# Patient Record
Sex: Female | Born: 1970 | Race: Black or African American | Hispanic: No | Smoking: Former smoker
Health system: Southern US, Community
[De-identification: ages and names within clinical notes are randomized; demographics above are authoritative.]

## PROBLEM LIST (undated history)

## (undated) DIAGNOSIS — M1990 Unspecified osteoarthritis, unspecified site: Secondary | ICD-10-CM

## (undated) DIAGNOSIS — F419 Anxiety disorder, unspecified: Secondary | ICD-10-CM

## (undated) DIAGNOSIS — D649 Anemia, unspecified: Secondary | ICD-10-CM

## (undated) DIAGNOSIS — I10 Essential (primary) hypertension: Secondary | ICD-10-CM

## (undated) DIAGNOSIS — F329 Major depressive disorder, single episode, unspecified: Secondary | ICD-10-CM

## (undated) DIAGNOSIS — D573 Sickle-cell trait: Secondary | ICD-10-CM

## (undated) DIAGNOSIS — E7800 Pure hypercholesterolemia, unspecified: Secondary | ICD-10-CM

## (undated) DIAGNOSIS — Z9989 Dependence on other enabling machines and devices: Secondary | ICD-10-CM

## (undated) DIAGNOSIS — E739 Lactose intolerance, unspecified: Secondary | ICD-10-CM

## (undated) DIAGNOSIS — F32A Depression, unspecified: Secondary | ICD-10-CM

## (undated) DIAGNOSIS — E559 Vitamin D deficiency, unspecified: Secondary | ICD-10-CM

## (undated) DIAGNOSIS — R000 Tachycardia, unspecified: Secondary | ICD-10-CM

## (undated) DIAGNOSIS — T7840XA Allergy, unspecified, initial encounter: Secondary | ICD-10-CM

## (undated) DIAGNOSIS — M549 Dorsalgia, unspecified: Secondary | ICD-10-CM

## (undated) DIAGNOSIS — M2550 Pain in unspecified joint: Secondary | ICD-10-CM

## (undated) DIAGNOSIS — G4733 Obstructive sleep apnea (adult) (pediatric): Secondary | ICD-10-CM

## (undated) DIAGNOSIS — G43909 Migraine, unspecified, not intractable, without status migrainosus: Secondary | ICD-10-CM

## (undated) DIAGNOSIS — R7303 Prediabetes: Secondary | ICD-10-CM

## (undated) DIAGNOSIS — F17200 Nicotine dependence, unspecified, uncomplicated: Secondary | ICD-10-CM

## (undated) DIAGNOSIS — K5900 Constipation, unspecified: Secondary | ICD-10-CM

## (undated) DIAGNOSIS — G35 Multiple sclerosis: Secondary | ICD-10-CM

## (undated) DIAGNOSIS — K219 Gastro-esophageal reflux disease without esophagitis: Secondary | ICD-10-CM

## (undated) DIAGNOSIS — K900 Celiac disease: Secondary | ICD-10-CM

## (undated) DIAGNOSIS — M7989 Other specified soft tissue disorders: Secondary | ICD-10-CM

## (undated) DIAGNOSIS — E039 Hypothyroidism, unspecified: Secondary | ICD-10-CM

## (undated) DIAGNOSIS — R079 Chest pain, unspecified: Secondary | ICD-10-CM

## (undated) HISTORY — DX: Anxiety disorder, unspecified: F41.9

## (undated) HISTORY — DX: Nicotine dependence, unspecified, uncomplicated: F17.200

## (undated) HISTORY — DX: Dorsalgia, unspecified: M54.9

## (undated) HISTORY — DX: Other specified soft tissue disorders: M79.89

## (undated) HISTORY — PX: LASER ABLATION OF THE CERVIX: SHX1949

## (undated) HISTORY — DX: Sickle-cell trait: D57.3

## (undated) HISTORY — DX: Depression, unspecified: F32.A

## (undated) HISTORY — DX: Vitamin D deficiency, unspecified: E55.9

## (undated) HISTORY — DX: Allergy, unspecified, initial encounter: T78.40XA

## (undated) HISTORY — DX: Unspecified osteoarthritis, unspecified site: M19.90

## (undated) HISTORY — DX: Dependence on other enabling machines and devices: Z99.89

## (undated) HISTORY — PX: CHOLECYSTECTOMY: SHX55

## (undated) HISTORY — DX: Tachycardia, unspecified: R00.0

## (undated) HISTORY — PX: TUBAL LIGATION: SHX77

## (undated) HISTORY — DX: Anemia, unspecified: D64.9

## (undated) HISTORY — DX: Migraine, unspecified, not intractable, without status migrainosus: G43.909

## (undated) HISTORY — DX: Hypothyroidism, unspecified: E03.9

## (undated) HISTORY — DX: Pain in unspecified joint: M25.50

## (undated) HISTORY — DX: Multiple sclerosis: G35

## (undated) HISTORY — DX: Constipation, unspecified: K59.00

## (undated) HISTORY — DX: Lactose intolerance, unspecified: E73.9

## (undated) HISTORY — DX: Essential (primary) hypertension: I10

## (undated) HISTORY — DX: Obstructive sleep apnea (adult) (pediatric): G47.33

## (undated) HISTORY — DX: Pure hypercholesterolemia, unspecified: E78.00

## (undated) HISTORY — DX: Major depressive disorder, single episode, unspecified: F32.9

## (undated) HISTORY — DX: Hemochromatosis due to repeated red blood cell transfusions: E83.111

## (undated) HISTORY — PX: THYROID SURGERY: SHX805

## (undated) HISTORY — DX: Chest pain, unspecified: R07.9

## (undated) HISTORY — PX: CERVICAL ABLATION: SHX5771

---

## 2004-09-24 ENCOUNTER — Other Ambulatory Visit: Admission: RE | Admit: 2004-09-24 | Discharge: 2004-09-24 | Payer: Self-pay | Admitting: Gynecology

## 2004-10-21 ENCOUNTER — Ambulatory Visit: Payer: Self-pay | Admitting: Oncology

## 2005-01-19 ENCOUNTER — Ambulatory Visit: Payer: Self-pay | Admitting: Oncology

## 2006-11-14 DIAGNOSIS — D649 Anemia, unspecified: Secondary | ICD-10-CM

## 2006-11-14 HISTORY — DX: Anemia, unspecified: D64.9

## 2007-06-15 ENCOUNTER — Ambulatory Visit: Payer: Self-pay | Admitting: Oncology

## 2007-07-18 ENCOUNTER — Other Ambulatory Visit: Admission: RE | Admit: 2007-07-18 | Discharge: 2007-07-18 | Payer: Self-pay | Admitting: Gynecology

## 2007-08-03 ENCOUNTER — Ambulatory Visit: Payer: Self-pay | Admitting: Oncology

## 2007-08-07 LAB — COMPREHENSIVE METABOLIC PANEL
AST: 17 U/L (ref 0–37)
Albumin: 4.1 g/dL (ref 3.5–5.2)
BUN: 11 mg/dL (ref 6–23)
CO2: 24 mEq/L (ref 19–32)
Calcium: 9.5 mg/dL (ref 8.4–10.5)
Chloride: 104 mEq/L (ref 96–112)
Creatinine, Ser: 0.75 mg/dL (ref 0.40–1.20)
Potassium: 4.2 mEq/L (ref 3.5–5.3)

## 2007-08-07 LAB — CBC WITH DIFFERENTIAL/PLATELET
Basophils Absolute: 0.1 10*3/uL (ref 0.0–0.1)
Eosinophils Absolute: 0.3 10*3/uL (ref 0.0–0.5)
HCT: 30.5 % — ABNORMAL LOW (ref 34.8–46.6)
HGB: 9.2 g/dL — ABNORMAL LOW (ref 11.6–15.9)
LYMPH%: 14.8 % (ref 14.0–48.0)
MONO#: 0.7 10*3/uL (ref 0.1–0.9)
NEUT#: 8.4 10*3/uL — ABNORMAL HIGH (ref 1.5–6.5)
NEUT%: 76.3 % (ref 39.6–76.8)
Platelets: 410 10*3/uL — ABNORMAL HIGH (ref 145–400)
WBC: 11.1 10*3/uL — ABNORMAL HIGH (ref 3.9–10.0)
lymph#: 1.6 10*3/uL (ref 0.9–3.3)

## 2007-08-07 LAB — IRON AND TIBC
%SAT: 3 % — ABNORMAL LOW (ref 20–55)
Iron: 11 ug/dL — ABNORMAL LOW (ref 42–145)
TIBC: 438 ug/dL (ref 250–470)
UIBC: 427 ug/dL

## 2007-09-19 ENCOUNTER — Ambulatory Visit: Payer: Self-pay | Admitting: Oncology

## 2009-06-30 ENCOUNTER — Ambulatory Visit: Payer: Self-pay | Admitting: Oncology

## 2009-06-30 LAB — CBC & DIFF AND RETIC
Basophils Absolute: 0 10*3/uL (ref 0.0–0.1)
Eosinophils Absolute: 0.2 10*3/uL (ref 0.0–0.5)
HGB: 7.1 g/dL — ABNORMAL LOW (ref 11.6–15.9)
LYMPH%: 21.8 % (ref 14.0–49.7)
MCH: 14.9 pg — ABNORMAL LOW (ref 25.1–34.0)
MCV: 55 fL — ABNORMAL LOW (ref 79.5–101.0)
MONO%: 6.6 % (ref 0.0–14.0)
NEUT#: 6.7 10*3/uL — ABNORMAL HIGH (ref 1.5–6.5)
NEUT%: 69.5 % (ref 38.4–76.8)
Platelets: 265 10*3/uL (ref 145–400)

## 2009-06-30 LAB — COMPREHENSIVE METABOLIC PANEL
Albumin: 4 g/dL (ref 3.5–5.2)
Alkaline Phosphatase: 69 U/L (ref 39–117)
CO2: 24 mEq/L (ref 19–32)
Calcium: 9.3 mg/dL (ref 8.4–10.5)
Chloride: 106 mEq/L (ref 96–112)
Glucose, Bld: 91 mg/dL (ref 70–99)
Potassium: 4.1 mEq/L (ref 3.5–5.3)
Sodium: 140 mEq/L (ref 135–145)
Total Protein: 7.4 g/dL (ref 6.0–8.3)

## 2009-07-01 LAB — IRON AND TIBC: UIBC: 437 ug/dL

## 2009-07-03 LAB — FECAL OCCULT BLOOD, GUAIAC: Occult Blood: NEGATIVE

## 2009-07-04 ENCOUNTER — Encounter: Payer: Self-pay | Admitting: Emergency Medicine

## 2009-07-05 ENCOUNTER — Ambulatory Visit: Payer: Self-pay | Admitting: Family Medicine

## 2009-07-05 ENCOUNTER — Observation Stay (HOSPITAL_COMMUNITY): Admission: EM | Admit: 2009-07-05 | Discharge: 2009-07-05 | Payer: Self-pay | Admitting: Family Medicine

## 2009-07-30 ENCOUNTER — Encounter: Admission: RE | Admit: 2009-07-30 | Discharge: 2009-07-30 | Payer: Self-pay | Admitting: Emergency Medicine

## 2009-08-26 ENCOUNTER — Ambulatory Visit: Payer: Self-pay | Admitting: Oncology

## 2009-09-04 LAB — CBC WITH DIFFERENTIAL/PLATELET
BASO%: 0.3 % (ref 0.0–2.0)
Basophils Absolute: 0 10*3/uL (ref 0.0–0.1)
HCT: 36.6 % (ref 34.8–46.6)
HGB: 11.4 g/dL — ABNORMAL LOW (ref 11.6–15.9)
LYMPH%: 23.5 % (ref 14.0–49.7)
MCH: 23.1 pg — ABNORMAL LOW (ref 25.1–34.0)
MCHC: 31.1 g/dL — ABNORMAL LOW (ref 31.5–36.0)
MONO#: 0.4 10*3/uL (ref 0.1–0.9)
NEUT%: 70.5 % (ref 38.4–76.8)
Platelets: 310 10*3/uL (ref 145–400)
WBC: 8.6 10*3/uL (ref 3.9–10.3)

## 2009-09-04 LAB — IRON AND TIBC: Iron: 39 ug/dL — ABNORMAL LOW (ref 42–145)

## 2009-09-23 ENCOUNTER — Ambulatory Visit: Payer: Self-pay | Admitting: Oncology

## 2009-09-25 LAB — CBC WITH DIFFERENTIAL/PLATELET
Basophils Absolute: 0 10*3/uL (ref 0.0–0.1)
EOS%: 1.3 % (ref 0.0–7.0)
HGB: 12 g/dL (ref 11.6–15.9)
MCH: 24.1 pg — ABNORMAL LOW (ref 25.1–34.0)
MCV: 75.7 fL — ABNORMAL LOW (ref 79.5–101.0)
MONO%: 4.1 % (ref 0.0–14.0)
NEUT#: 6.6 10*3/uL — ABNORMAL HIGH (ref 1.5–6.5)
RBC: 4.98 10*6/uL (ref 3.70–5.45)
RDW: 18.8 % — ABNORMAL HIGH (ref 11.2–14.5)
lymph#: 2.1 10*3/uL (ref 0.9–3.3)
nRBC: 0 % (ref 0–0)

## 2009-09-29 LAB — COMPREHENSIVE METABOLIC PANEL
ALT: 16 U/L (ref 0–35)
AST: 15 U/L (ref 0–37)
Albumin: 4.2 g/dL (ref 3.5–5.2)
Alkaline Phosphatase: 66 U/L (ref 39–117)
Potassium: 3.6 mEq/L (ref 3.5–5.3)
Sodium: 141 mEq/L (ref 135–145)
Total Bilirubin: 0.2 mg/dL — ABNORMAL LOW (ref 0.3–1.2)
Total Protein: 7.2 g/dL (ref 6.0–8.3)

## 2009-09-29 LAB — HEMOGLOBINOPATHY EVALUATION
Hemoglobin Other: 0 % (ref 0.0–0.0)
Hgb A2 Quant: 2.7 % (ref 2.2–3.2)
Hgb A: 64.4 % — ABNORMAL LOW (ref 96.8–97.8)
Hgb S Quant: 32.9 % — ABNORMAL HIGH (ref 0.0–0.0)

## 2009-12-11 ENCOUNTER — Ambulatory Visit: Payer: Self-pay | Admitting: Oncology

## 2009-12-11 LAB — CBC & DIFF AND RETIC
BASO%: 0.6 % (ref 0.0–2.0)
LYMPH%: 24.5 % (ref 14.0–49.7)
MCHC: 32 g/dL (ref 31.5–36.0)
MCV: 77.1 fL — ABNORMAL LOW (ref 79.5–101.0)
MONO#: 0.4 10*3/uL (ref 0.1–0.9)
MONO%: 5 % (ref 0.0–14.0)
Platelets: 271 10*3/uL (ref 145–400)
RBC: 5.03 10*6/uL (ref 3.70–5.45)
WBC: 8.8 10*3/uL (ref 3.9–10.3)
nRBC: 0 % (ref 0–0)

## 2009-12-11 LAB — IRON AND TIBC
%SAT: 16 % — ABNORMAL LOW (ref 20–55)
TIBC: 352 ug/dL (ref 250–470)

## 2010-01-13 ENCOUNTER — Ambulatory Visit: Payer: Self-pay | Admitting: Oncology

## 2011-02-04 ENCOUNTER — Other Ambulatory Visit (HOSPITAL_COMMUNITY): Payer: Self-pay | Admitting: Oncology

## 2011-02-04 ENCOUNTER — Encounter (HOSPITAL_BASED_OUTPATIENT_CLINIC_OR_DEPARTMENT_OTHER): Payer: 59 | Admitting: Oncology

## 2011-02-04 DIAGNOSIS — D509 Iron deficiency anemia, unspecified: Secondary | ICD-10-CM

## 2011-02-04 LAB — COMPREHENSIVE METABOLIC PANEL
ALT: 17 U/L (ref 0–35)
AST: 20 U/L (ref 0–37)
Alkaline Phosphatase: 72 U/L (ref 39–117)
BUN: 10 mg/dL (ref 6–23)
Creatinine, Ser: 0.72 mg/dL (ref 0.40–1.20)
Potassium: 4.3 mEq/L (ref 3.5–5.3)

## 2011-02-04 LAB — IRON AND TIBC
TIBC: 437 ug/dL (ref 250–470)
UIBC: 426 ug/dL

## 2011-02-04 LAB — FERRITIN: Ferritin: 2 ng/mL — ABNORMAL LOW (ref 10–291)

## 2011-02-04 LAB — CBC WITH DIFFERENTIAL/PLATELET
Basophils Absolute: 0 10*3/uL (ref 0.0–0.1)
Eosinophils Absolute: 0.2 10*3/uL (ref 0.0–0.5)
HGB: 8 g/dL — ABNORMAL LOW (ref 11.6–15.9)
NEUT#: 7.2 10*3/uL — ABNORMAL HIGH (ref 1.5–6.5)
RDW: 20.8 % — ABNORMAL HIGH (ref 11.2–14.5)
WBC: 9.8 10*3/uL (ref 3.9–10.3)
lymph#: 1.9 10*3/uL (ref 0.9–3.3)

## 2011-02-09 ENCOUNTER — Encounter (HOSPITAL_BASED_OUTPATIENT_CLINIC_OR_DEPARTMENT_OTHER): Payer: 59 | Admitting: Oncology

## 2011-02-09 DIAGNOSIS — D509 Iron deficiency anemia, unspecified: Secondary | ICD-10-CM

## 2011-02-15 ENCOUNTER — Inpatient Hospital Stay (HOSPITAL_BASED_OUTPATIENT_CLINIC_OR_DEPARTMENT_OTHER): Payer: 59 | Admitting: Oncology

## 2011-02-15 DIAGNOSIS — D509 Iron deficiency anemia, unspecified: Secondary | ICD-10-CM

## 2011-02-19 LAB — CBC
HCT: 22.6 % — ABNORMAL LOW (ref 36.0–46.0)
Hemoglobin: 6.8 g/dL — CL (ref 12.0–15.0)
MCV: 56 fL — ABNORMAL LOW (ref 78.0–100.0)
RBC: 4.34 MIL/uL (ref 3.87–5.11)
RDW: 22.4 % — ABNORMAL HIGH (ref 11.5–15.5)
WBC: 29.9 10*3/uL — ABNORMAL HIGH (ref 4.0–10.5)
WBC: 32 10*3/uL — ABNORMAL HIGH (ref 4.0–10.5)

## 2011-02-19 LAB — POCT CARDIAC MARKERS
CKMB, poc: <1 ng/mL — ABNORMAL LOW (ref 1.0–8.0)
Troponin i, poc: <0.05 ng/mL (ref 0.00–0.09)

## 2011-02-19 LAB — RETICULOCYTES
RBC.: 4.23 MIL/uL (ref 3.87–5.11)
Retic Count, Absolute: 84.6 10*3/uL (ref 19.0–186.0)

## 2011-02-19 LAB — DIFFERENTIAL
Basophils Absolute: 0 10*3/uL (ref 0.0–0.1)
Lymphocytes Relative: 7 % — ABNORMAL LOW (ref 12–46)
Lymphs Abs: 2.2 10*3/uL (ref 0.7–4.0)
Monocytes Relative: 5 % (ref 3–12)
Neutrophils Relative %: 88 % — ABNORMAL HIGH (ref 43–77)

## 2011-02-19 LAB — DIC (DISSEMINATED INTRAVASCULAR COAGULATION)PANEL
D-Dimer, Quant: 0.85 ug/mL-FEU — ABNORMAL HIGH (ref 0.00–0.48)
Prothrombin Time: 13.2 seconds (ref 11.6–15.2)

## 2011-02-19 LAB — COMPREHENSIVE METABOLIC PANEL
ALT: 13 U/L (ref 0–35)
Albumin: 3.1 g/dL — ABNORMAL LOW (ref 3.5–5.2)
Calcium: 9.1 mg/dL (ref 8.4–10.5)
Glucose, Bld: 130 mg/dL — ABNORMAL HIGH (ref 70–99)
Sodium: 140 mEq/L (ref 135–145)
Total Protein: 6.6 g/dL (ref 6.0–8.3)

## 2011-02-19 LAB — CULTURE, BLOOD (ROUTINE X 2): Culture: NO GROWTH

## 2011-02-19 LAB — CARDIAC PANEL(CRET KIN+CKTOT+MB+TROPI)
CK, MB: 2.1 ng/mL (ref 0.3–4.0)
CK, MB: 2.3 ng/mL (ref 0.3–4.0)
Relative Index: INVALID (ref 0.0–2.5)
Relative Index: INVALID (ref 0.0–2.5)
Total CK: 46 U/L (ref 7–177)

## 2011-02-19 LAB — BLOOD GAS, ARTERIAL
Acid-Base Excess: 1.9 mmol/L (ref 0.0–2.0)
Bicarbonate: 26.1 mEq/L — ABNORMAL HIGH (ref 20.0–24.0)
O2 Saturation: 95.6 %
TCO2: 27.4 mmol/L (ref 0–100)
pCO2 arterial: 41.8 mmHg (ref 35.0–45.0)
pO2, Arterial: 74.4 mmHg — ABNORMAL LOW (ref 80.0–100.0)

## 2011-02-19 LAB — BASIC METABOLIC PANEL
Calcium: 9.3 mg/dL (ref 8.4–10.5)
Creatinine, Ser: 0.81 mg/dL (ref 0.4–1.2)
GFR calc Af Amer: >60 mL/min (ref >60)
GFR calc non Af Amer: >60 mL/min (ref >60)

## 2011-02-19 LAB — LACTATE DEHYDROGENASE: LDH: 153 U/L (ref 94–250)

## 2011-02-19 LAB — URINE CULTURE

## 2011-02-25 ENCOUNTER — Encounter (HOSPITAL_COMMUNITY)
Admission: RE | Admit: 2011-02-25 | Discharge: 2011-02-25 | Disposition: A | Discharge status: Home / Self Care | Payer: 59 | Source: Ambulatory Visit | Attending: Obstetrics and Gynecology | Admitting: Obstetrics and Gynecology

## 2011-03-04 ENCOUNTER — Ambulatory Visit (HOSPITAL_COMMUNITY)
Admission: AD | Admit: 2011-03-04 | Discharge: 2011-03-04 | Disposition: A | Discharge status: Home / Self Care | Payer: 59 | Source: Ambulatory Visit | Attending: Obstetrics and Gynecology | Admitting: Obstetrics and Gynecology

## 2011-03-04 ENCOUNTER — Other Ambulatory Visit: Payer: Self-pay | Admitting: Obstetrics and Gynecology

## 2011-03-04 DIAGNOSIS — N920 Excessive and frequent menstruation with regular cycle: Secondary | ICD-10-CM | POA: Insufficient documentation

## 2011-03-04 DIAGNOSIS — Z01812 Encounter for preprocedural laboratory examination: Secondary | ICD-10-CM | POA: Insufficient documentation

## 2011-03-04 DIAGNOSIS — Z01818 Encounter for other preprocedural examination: Secondary | ICD-10-CM | POA: Insufficient documentation

## 2011-03-04 DIAGNOSIS — D649 Anemia, unspecified: Secondary | ICD-10-CM | POA: Insufficient documentation

## 2011-03-04 DIAGNOSIS — I10 Essential (primary) hypertension: Secondary | ICD-10-CM | POA: Insufficient documentation

## 2011-03-04 LAB — HCG, QUANTITATIVE, PREGNANCY: hCG, Beta Chain, Quant, S: <2 m[IU]/mL (ref <5)

## 2011-03-07 HISTORY — PX: HYSTEROSCOPY W/ ENDOMETRIAL ABLATION: SUR665

## 2011-03-08 NOTE — Op Note (Signed)
  [name], [name]                ACCOUNT NO.:  [id number]  MEDICAL RECORD NO.:  [id number]           PATIENT TYPE:  LOCATION:                                 FACILITY:  PHYSICIAN:  Ly Bacchi A. Asyah Candler, M.D. DATE OF BIRTH:  [birth year]  DATE OF PROCEDURE: DATE OF DISCHARGE:                              OPERATIVE REPORT   PREOPERATIVE DIAGNOSES:  Menorrhagia, symptomatic anemia.  POSTOPERATIVE DIAGNOSES:  Menorrhagia, symptomatic anemia.  PROCEDURE:  Dilation and curettage, hysteroscopy, ThermaChoice endometrial ablation.  SURGEON:  Dekota Kirlin A. Kavitha Lansdale, MD  ASSISTANT:  There are no assistants.  ANESTHESIA:  General laryngeal mask airway and local.  FINDINGS:  Retroverted uterus with a blood clot and debris secondary to menses.  No submucosal fibroids or polyps were seen.  Endometrial curettings were sent to Pathology.  ESTIMATED BLOOD LOSS:  Minimal.  COMPLICATIONS:  None.  The patient went to PACU in stable condition.PROCEDURE IN DETAIL:  The patient was taken to the operating room where she was given general anesthesia and placed in dorsal lithotomy position and prepped and draped in a normal sterile fashion.  An in-and-out catheter was used to drain the bladder.  The anterior lip of the cervix was grasped with single-tooth tenaculum and 20 mL of 1% lidocaine was used for cervical block.  The cervix was then further dilated with [facility] dilators and the hysteroscope was placed into the uterine cavity. Cervix appeared normal.  The fundus had a huge clot at the fundus and the uterus appeared larger than expected.  There were no submucosal fibroids or polyps seen.  I removed the hysteroscope and did a D and C. There was clot and uterine lining obtained.  We then did the ThermaChoice and the uterus did sound to 13 cm.  So, when I put the probe in just to make sure I did not perforate during the curettage, I took the ThermaChoice out before we started.  I put the  hysteroscope back in.  There was no evidence of perforation.  The fundus was still visualized and the deficit at that time was only 145 of D5W.  I then put the ThermaChoice back in and did ThermaChoice per protocol without difficulty.  The ThermaChoice was removed and again I looked into the uterus with the hysteroscope and no signs of perforation.  No signs of mucosal fibroids or polyps and then looked like even the clot was still near the fundus.  It looked like the ablation was successful on ablating the fundus.  All instruments were removed from the cervix and uterus.  Sponge, lap, and needle counts were correct.  The patient went to the recovery room in stable condition.  Deficit total of 190 mL of D5W.     Abriella Filkins A. Normand Sloop, M.D.     NAD/MEDQ  D:  03/04/2011  T:  03/05/2011  Job:  [id number]  Electronically Signed by Jaymes Graff M.D. on 03/08/2011 04:27:57 PM

## 2011-03-08 NOTE — H&P (Signed)
[name], [name]                ACCOUNT NO.:  [id number]  MEDICAL RECORD NO.:  [id number]          PATIENT TYPE:  LOCATION:                                 FACILITY:  PHYSICIAN:  Mert Dietrick A. Latiesha Harada, M.D.      DATE OF BIRTH:  DATE OF ADMISSION: DATE OF DISCHARGE:                             HISTORY & PHYSICAL   DIAGNOSES:  Anemia and heavy bleeding.  The patient is a 40 year old African American female gravida 3, para 3 who presented to me on January 18, 2011, complaining of anemia and heavy bleeding.  She states that the bleeding has been heavy for 15-16 years. She is anemic and gets iron injections.  Her menses is every month lasting 5-7 days.  The patient bleeds through her clothes and wears diapers.  She changes diapers and pads about every 2 hours.  She has Lysteda, and that has not helped.  She is currently on no contraception, does not have a history of fibroids, is not on hormone therapy, is not on any new medications, has no menopausal symptoms, vaginal discharge, abdominal pain, or increased stress.  Denies having any history of blood transfusions.  She does chew toilet paper.  Her hemoglobin last month was 8.6.  PAST MEDICAL HISTORY:  Significant for hypertension and anemia.  PAST SURGICAL HISTORY:  Significant for cesarean section x3, wisdom teeth, and a tubal ligation.  The patient smokes 4-5 cigarettes a day, has occasional alcohol use, but no illicit drug use.  MEDICATIONS: 1. Clonazepam 0.5 mg a day. 2. Metronidazole and losartan/hydrochlorothiazide each day.  She is allergic to DOXYCYCLINE.  PHYSICAL EXAMINATION:  VITAL SIGNS:  She weighs 287 pounds.  She is 5 feet 3 inches.  Her BMI is 50.  Her blood pressure is 130/82. HEENT:  Her pupils are equal.  Her hearing is normal.  Her throat is clear.  Thyroid is not enlarged. HEART:  Regular rate and rhythm. CHEST:  Clear to auscultation bilaterally. BREASTS:  No masses, discharge, skin change, or nipple  retraction. BACK:  No CVA tenderness. GI:  No tenderness.  No masses.  No organomegaly. MUSCULOSKELETAL:  No cyanosis, clubbing, or edema. NEURO:  Within normal limits. PELVIC:  Full vaginal exam within normal limits.  Cervix is nontender without lesions.  Uterus is normal shape, size, and consistency, and nontender.  Adnexa has no masses.  PERTINENT LABORATORY DATA:  Last hemoglobin was 8.0.  She had low-grade lesions on her Pap smear and ultrasound measures 9.1 x 4.5 x 4.83.  Her ovaries were normal, and the uterus is very retroverted.  She does have two fibroids, one measuring 2.6 cm and measuring 2.09 cm.  ASSESSMENT:  Menorrhagia, fibroids.  All treatments were reviewed with the patient, observation, Lysteda, nonsteroidal antiinflammatory drugs, dilation and curettage, hysteroscopy with and without ablation.  The patient shows dilation and curettage, hysteroscopy with ablation.  She understands the risk of, but not limited to bleeding, infection, damage to internal organs such as bowel, bladder and major blood vessels.  She had an endometrial biopsy, which showed proliferative endometrium, and she had a cervical biopsy, which showed cervical intraepithelial  neoplasia 1, for which she will follow Pap smears.     [name] A. Normand Sloop, M.D.     NAD/MEDQ  D:  03/04/2011  T:  03/05/2011  Job:  [id number]  Electronically Signed by Jaymes Graff M.D. on 03/08/2011 04:28:12 PM

## 2011-03-29 NOTE — Discharge Summary (Signed)
[name], [name]                ACCOUNT NO.:  [id number]   MEDICAL RECORD NO.:  [id number]          PATIENT TYPE:  INP   LOCATION:  2021                         FACILITY:  [facility]   PHYSICIAN:  Pearlean Brownie, M.D.DATE OF BIRTH:  06/23/1971   DATE OF ADMISSION:  07/05/2009  DATE OF DISCHARGE:  07/05/2009                               DISCHARGE SUMMARY   REASON FOR HOSPITALIZATION:  Low hemoglobin and shortness of breath.   DISCHARGE DIAGNOSES:  Shortness of breath without hypoxia, improved.   ADDITIONAL DIAGNOSES:  1. Iron-deficiency anemia.  2. Menorrhagia.  3. Hypertension.  4. Sickle cell trait.  5. Status post cholecystectomy.  6. Status post C-sections x3.   DISCHARGE MEDICATIONS:  1. Lisinopril 10 mg p.o. daily.  2. HCT 12.5 mg p.o. daily.  3. Ambien at night as needed.  4. Iron 325 mg p.o. b.i.d.  5. IV iron infusions as previously scheduled.   SIGNIFICANT FINDINGS DURING THIS HOSPITALIZATION:  1. CT angiogram of the chest was negative for any pulmonary embolism.  2. Chest x-ray showed no active disease.  3. Cardiac enzymes were cycled and were negative throughout the      hospitalization.  4. Complete metabolic panel was entirely within normal limits except      for an elevated glucose of 130 and a slightly low albumin at 3.1.  5. LDH normal at 153.  Haptoglobin slightly elevated to 111.      Reticulocyte percentage of 2.0 and a DIC panel which was entirely      within normal limits except for an at elevated D-dimer of 0.85.  6. Blood smear showed polychromasia, large platelets, target cells and      spherocytes.  7. CBC performed on this hospitalization showed white blood cell count      32.0, hemoglobin 7.1 and platelet count of 194 with 88%      neutrophils.   BRIEF HOSPITAL COURSE:  The patient 40 year old female with a history of  iron deficiency anemia requiring IV iron transfusions who last received  IV iron on Friday prior to admission who presents  with shortness of  breath on exertion.  On admission the patient had a significant  leukocytosis up to 32,000 white blood cells but was afebrile with stable  vital signs.  She was also not hypoxic.  CT angiogram of the chest tube  was negative for any pulmonary embolism and blood smear reviewed target  cells and spherocytes.  The patient was found to have significant anemia  down to 7.1, however, per the patient's report this is not unusual.  The  patient's leukocytosis was of unclear etiology; however given her recent  transfusion, lack of fever and any other signs of ongoing infection  including normal chest x-ray and CT angiogram, it was felt that her  leukocytosis was likely reactive in nature due to recent iron  administration.  The patient did have walking pulse oximetry of the  distance three laps around the 2000 unit here in the hospital.  The  patient's oxygen saturations were maintained at 98% throughout this  ambulation and the patient  did well.  On the day of discharge the  patient was desiring to go home and stated that she would follow up soon  in 2 to 3 days with her primary care [name] Dr. Daub/Dr. Hal Hope at  [facility].  Please note that the patient did have one episode  of chest tightness during this hospitalization, again cardiac enzymes  were negative and EKG was unremarkable for any signs of ischemia.  Given  negative workup and the patient's desire to go home it was deemed  appropriate to let the patient be discharged to home with strict  instructions to return for any further episodes of chest pain.  The  patient voiced agreement and understanding and states that she will seek  medical attention for any further episodes of chest pain.  At the time  of discharge the patient is chest pain free and desiring to go home.   ISSUES FOR FOLLOW-UP:  The patient's white blood cell count and  hemoglobin should be monitored.  She should receive follow-up with her   Hematologist, Dr. Arline Asp for further workup of her anemia and  leukocytosis.   DISCHARGE CONDITION:  Stable.   DISPOSITION:  Patient discharged to home with instructions to follow-up  in 2-3 days at [facility] and with Dr. Cleta Alberts or Dr.  Pearson Grippe, MD  Electronically Signed      Pearlean Brownie, M.D.  Electronically Signed    TE/MEDQ  D:  07/05/2009  T:  07/05/2009  Job:  [id number]   cc:   Brett Canales A. Cleta Alberts, M.D.  Samul Dada, M.D.

## 2011-03-29 NOTE — H&P (Signed)
[name], [name]                ACCOUNT NO.:  [id number]   MEDICAL RECORD NO.:  [id number]          PATIENT TYPE:  EMS   LOCATION:  ED                           FACILITY:  [facility]   PHYSICIAN:  Pearlean Brownie, M.D.DATE OF BIRTH:  19-May-1971   DATE OF ADMISSION:  07/04/2009  DATE OF DISCHARGE:  07/05/2009                              HISTORY & PHYSICAL   PRIMARY CARE Jeneal Vogl:  Brett Canales A. Cleta Alberts, MD, Ernesto Rutherford.   CHIEF COMPLAINT:  Dyspnea on exertion.   HISTORY OF PRESENT ILLNESS:  This patient is a pleasant 40 year old  woman with a past medical history significant for iron deficiency  anemia, secondary to menorrhagia.  She was found to have a hemoglobin of  5.5 at [place] earlier this week.  She received IV iron on Friday and  felt well during the transfusion.  However, upon arriving home she  noticed a new profound dyspnea on exertion.  She states that she is  unable to walk even a few feet without becoming dyspneic.  She also  notes chest pressure and a tingling sensation down the left arm with her  dyspnea.  She locates the pain/sensation under her left ribs.  It is  only present when she is taking rapid deep breaths and abates with rest.  She describes the arm sensation is a slow wave of tingle, it occurs  after dyspnea and chest discomfort.  She also notes that over the past  years, she likes to eat ice and chew toilet paper.  She denies any  fever, chills, sweats, nausea, vomiting, feeling fatigue and weight  loss, bony aches or pains or bleeding from gums or nosebleeds.   REVIEW OF SYSTEMS:  Twelve-item review of system is negative.  Please  see HPI.   ALLERGIES:  To DOXYCYCLINE, which causes a metallic taste in her mouth.   MEDICINES:  1. Lisinopril 10 mg p.o. daily.  2. Hydrochlorothiazide 12.5 mg p.o. daily.  3. Ambien p.o. at bedtime p.r.n.  4. Iron 325 p.o. b.i.d. (actually taking this medicine very      irregularly).   PAST MEDICAL HISTORY:  Significant for:  1.  Iron-deficiency anemia requiring 3 IV iron transfusion since 2006.  2. Menorrhagia.  Lasting 5 days requiring infant diapers, this has      been worked up by the patient.  3. Hypertension.  4. Sickle cell trait.   PAST SURGICAL HISTORY:  1. Three cesarean sections.  2. Lap cholecystectomy.   FAMILY HISTORY:  Significant for anemia on father's side and paternal  aunts and great aunts with early breast cancer.  Mom with hypertension,  diabetes, and hyperlipidemia.   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 She lives with  her 3 children and a 49-month-old grandchild.  She smokes 5 cigarettes  per day, but quite earlier this week.  Occasional alcohol use and no  drug use.   PHYSICAL EXAMINATION:  VITAL SIGNS:  Temperature 97.9, heart rate 87,  blood pressure 152/94, sating 99% on 2 L nasal cannula.  GENERAL:  Obese, alert, and oriented, in no acute distress.  CARDIAC:  Regular rate and rhythm.  No murmurs, rubs, or gallops.  Bilateral radial pulses 2+.  No carotid bruit.  PULMONARY:  Clear to auscultation bilaterally.  Normal work of  breathing.  ABDOMEN:  Obese.  Normoactive bowel sounds.  Soft and nontender.  I am  not able to appreciate hepatosplenomegaly, secondary to body habitus.  EXTREMITIES:  Nontender bilateral lower extremities without edema.  HEENT:  Moist mucous membranes.  Extraocular motion intact.  Pupils  equal, round, and reactive to light.  NEUROLOGIC:  Alert and oriented x3.   LABORATORIES:  Radiology:  Basic metabolic panel:  Significant for a  potassium of 3.3 and a creatinine of 0.81, and glucose of 147.   CBC:  White count 32.0, hemoglobin 7.1, hematocrit 23.5, and platelets  194.  MCV 54.2, red blood cells 4.43, and RDW 22.9.  Blood smear  significant for target cells, polychromasia present, and schistocyte  present 2-5 per high-power field.   Cardiac enzymes:  Point-of-care negative x1.   Chest x-ray:  No active disease.  Chest CT  angiogram, no acute findings  with no evidence of PE.   EKG:  Normal sinus rhythm.  No ST changes with respiratory variation.   ASSESSMENT AND PLAN:  A 40 year old woman with dyspnea on exertion.  1. Dyspnea on exertion associated with chest pain.  We are evaluating      for cardiac issues of POE and pain.  Pulmonary embolism has been      ruled out with CTA.  Possibly due to anemia, however, this POE is      new.  At the moment, the etiology is unclear.  I feel the patient      is safe for tonight on telemetry.  Plan:  Provide O2, keep O2 sats      elevated.  We will discuss the theme in the morning.  2. Chest pain, likely pleuritic in nature.  EKG normal as is cardiac      enzymes x1.  We will cycle enzymes and obtain EKG in a.m.  We will      follow her telemetry overnight.  Likely, the etiology is same that      is producing the dyspnea on exertion.  3. 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  We also suspect a component of thalassemia as she has a very      small MCV with normal RBCs.  We are also concerned for ongoing      homolysis, secondary to possible MAHA.  She has schistocytes appear      on CBC evaluation.  Plan:  Order smear with pathologist review as      well as hemolysis in DICU labs.  Iron studies, I believe is      secondary to IV iron in the past.  4. Leukocytosis unlikely due to infection as she is afebrile and has      no obvious source of infection.  It is possible due to reaction of      IV iron, however, the differential also includes acute or chronic      leukemia and a reaction to chronically low anemia and      hematopoiesis.  5. Hypertension.  Poorly controlled in the hospital.  We will increase      the antihypertensives in the a.m.  We will      follow for hypertension urgency overnight, although we feel this is  unlikely.  6. Prophylaxis.  Heparin and Protonix.  7. Disposition.  Pending workup of dyspnea on  exertion and anemia.      Clementeen Graham, MD  Electronically Signed      Pearlean Brownie, M.D.  Electronically Signed    EC/MEDQ  D:  07/05/2009  T:  07/05/2009  Job:  [id number]

## 2011-03-31 ENCOUNTER — Other Ambulatory Visit (HOSPITAL_COMMUNITY): Payer: Self-pay | Admitting: Oncology

## 2011-03-31 ENCOUNTER — Encounter (HOSPITAL_BASED_OUTPATIENT_CLINIC_OR_DEPARTMENT_OTHER): Payer: 59 | Admitting: Oncology

## 2011-03-31 DIAGNOSIS — D509 Iron deficiency anemia, unspecified: Secondary | ICD-10-CM

## 2011-03-31 LAB — CBC WITH DIFFERENTIAL/PLATELET
Basophils Absolute: 0 10*3/uL (ref 0.0–0.1)
Eosinophils Absolute: 0.2 10*3/uL (ref 0.0–0.5)
HCT: 40 % (ref 34.8–46.6)
HGB: 12.6 g/dL (ref 11.6–15.9)
LYMPH%: 24.9 % (ref 14.0–49.7)
MCHC: 31.5 g/dL (ref 31.5–36.0)
MONO#: 0.5 10*3/uL (ref 0.1–0.9)
MONO%: 5.2 % (ref 0.0–14.0)
NEUT#: 6.8 10*3/uL — ABNORMAL HIGH (ref 1.5–6.5)
Platelets: 275 10*3/uL (ref 145–400)
WBC: 10.1 10*3/uL (ref 3.9–10.3)
nRBC: 0 % (ref 0–0)

## 2011-03-31 LAB — FERRITIN: Ferritin: 64 ng/mL (ref 10–291)

## 2011-03-31 LAB — IRON AND TIBC
%SAT: 15 % — ABNORMAL LOW (ref 20–55)
Iron: 50 ug/dL (ref 42–145)
TIBC: 338 ug/dL (ref 250–470)

## 2011-05-02 ENCOUNTER — Other Ambulatory Visit (HOSPITAL_COMMUNITY): Payer: Self-pay | Admitting: Oncology

## 2011-05-02 ENCOUNTER — Encounter (HOSPITAL_BASED_OUTPATIENT_CLINIC_OR_DEPARTMENT_OTHER): Payer: 59 | Admitting: Oncology

## 2011-05-02 DIAGNOSIS — D509 Iron deficiency anemia, unspecified: Secondary | ICD-10-CM

## 2011-05-02 LAB — CBC WITH DIFFERENTIAL/PLATELET
EOS%: 1.2 % (ref 0.0–7.0)
HGB: 13.2 g/dL (ref 11.6–15.9)
MCH: 24.6 pg — ABNORMAL LOW (ref 25.1–34.0)
MCV: 75.2 fL — ABNORMAL LOW (ref 79.5–101.0)
MONO%: 7.1 % (ref 0.0–14.0)
NEUT#: 7.2 10*3/uL — ABNORMAL HIGH (ref 1.5–6.5)
RBC: 5.36 10*6/uL (ref 3.70–5.45)
RDW: 17.7 % — ABNORMAL HIGH (ref 11.2–14.5)
lymph#: 2 10*3/uL (ref 0.9–3.3)
nRBC: 0 % (ref 0–0)

## 2011-05-02 LAB — FERRITIN: Ferritin: 42 ng/mL (ref 10–291)

## 2011-05-05 ENCOUNTER — Encounter (HOSPITAL_BASED_OUTPATIENT_CLINIC_OR_DEPARTMENT_OTHER): Payer: 59 | Admitting: Oncology

## 2011-05-05 DIAGNOSIS — D509 Iron deficiency anemia, unspecified: Secondary | ICD-10-CM

## 2011-05-20 ENCOUNTER — Encounter (HOSPITAL_BASED_OUTPATIENT_CLINIC_OR_DEPARTMENT_OTHER): Payer: 59 | Admitting: Oncology

## 2011-05-20 DIAGNOSIS — D509 Iron deficiency anemia, unspecified: Secondary | ICD-10-CM

## 2011-05-31 ENCOUNTER — Other Ambulatory Visit (HOSPITAL_COMMUNITY): Payer: Self-pay | Admitting: Oncology

## 2011-05-31 ENCOUNTER — Encounter (HOSPITAL_BASED_OUTPATIENT_CLINIC_OR_DEPARTMENT_OTHER): Payer: 59 | Admitting: Oncology

## 2011-05-31 DIAGNOSIS — D509 Iron deficiency anemia, unspecified: Secondary | ICD-10-CM

## 2011-05-31 LAB — CBC WITH DIFFERENTIAL/PLATELET
BASO%: 0.2 % (ref 0.0–2.0)
EOS%: 2 % (ref 0.0–7.0)
MCH: 25.3 pg (ref 25.1–34.0)
MCHC: 32.8 g/dL (ref 31.5–36.0)
MONO#: 0.5 10*3/uL (ref 0.1–0.9)
RBC: 5.33 10*6/uL (ref 3.70–5.45)
RDW: 14.7 % — ABNORMAL HIGH (ref 11.2–14.5)
WBC: 8.7 10*3/uL (ref 3.9–10.3)
lymph#: 2.2 10*3/uL (ref 0.9–3.3)
nRBC: 0 % (ref 0–0)

## 2011-06-03 LAB — COMPREHENSIVE METABOLIC PANEL
BUN: 7 mg/dL (ref 6–23)
CO2: 22 mEq/L (ref 19–32)
Calcium: 9.3 mg/dL (ref 8.4–10.5)
Chloride: 104 mEq/L (ref 96–112)
Creatinine, Ser: 0.68 mg/dL (ref 0.50–1.10)
Glucose, Bld: 97 mg/dL (ref 70–99)

## 2011-06-03 LAB — IRON AND TIBC
Iron: 84 ug/dL (ref 42–145)
TIBC: 278 ug/dL (ref 250–470)
UIBC: 194 ug/dL

## 2011-06-03 LAB — LACTATE DEHYDROGENASE: LDH: 149 U/L (ref 94–250)

## 2011-06-03 LAB — TRANSFERRIN RECEPTOR, SOLUABLE: Transferrin Receptor, Soluble: 1.02 mg/L (ref 0.76–1.76)

## 2011-06-03 LAB — FERRITIN: Ferritin: 393 ng/mL — ABNORMAL HIGH (ref 10–291)

## 2011-08-12 ENCOUNTER — Encounter (HOSPITAL_BASED_OUTPATIENT_CLINIC_OR_DEPARTMENT_OTHER): Payer: 59 | Admitting: Oncology

## 2011-08-12 ENCOUNTER — Other Ambulatory Visit (HOSPITAL_COMMUNITY): Payer: Self-pay | Admitting: Oncology

## 2011-08-12 DIAGNOSIS — D509 Iron deficiency anemia, unspecified: Secondary | ICD-10-CM

## 2011-08-12 LAB — CBC WITH DIFFERENTIAL/PLATELET
Basophils Absolute: 0 10*3/uL (ref 0.0–0.1)
EOS%: 2.9 % (ref 0.0–7.0)
LYMPH%: 24.5 % (ref 14.0–49.7)
MCH: 26.1 pg (ref 25.1–34.0)
MCV: 79.6 fL (ref 79.5–101.0)
MONO%: 5.8 % (ref 0.0–14.0)
Platelets: 260 10*3/uL (ref 145–400)
RBC: 4.95 10*6/uL (ref 3.70–5.45)
RDW: 14.4 % (ref 11.2–14.5)
nRBC: 0 % (ref 0–0)

## 2011-08-12 LAB — FERRITIN: Ferritin: 129 ng/mL (ref 10–291)

## 2011-09-29 ENCOUNTER — Ambulatory Visit: Payer: 59 | Admitting: Physician Assistant

## 2011-09-29 ENCOUNTER — Other Ambulatory Visit: Payer: 59 | Admitting: Lab

## 2011-12-06 ENCOUNTER — Ambulatory Visit (INDEPENDENT_AMBULATORY_CARE_PROVIDER_SITE_OTHER): Payer: 59

## 2011-12-06 DIAGNOSIS — D649 Anemia, unspecified: Secondary | ICD-10-CM

## 2011-12-06 DIAGNOSIS — I10 Essential (primary) hypertension: Secondary | ICD-10-CM

## 2011-12-06 DIAGNOSIS — G43009 Migraine without aura, not intractable, without status migrainosus: Secondary | ICD-10-CM

## 2011-12-06 DIAGNOSIS — Z79899 Other long term (current) drug therapy: Secondary | ICD-10-CM

## 2012-01-02 ENCOUNTER — Ambulatory Visit (INDEPENDENT_AMBULATORY_CARE_PROVIDER_SITE_OTHER): Payer: 59 | Admitting: Physician Assistant

## 2012-01-02 VITALS — BP 122/75 | HR 118 | Temp 99.2°F | Resp 18

## 2012-01-02 DIAGNOSIS — J069 Acute upper respiratory infection, unspecified: Secondary | ICD-10-CM

## 2012-01-02 DIAGNOSIS — R05 Cough: Secondary | ICD-10-CM

## 2012-01-02 DIAGNOSIS — J329 Chronic sinusitis, unspecified: Secondary | ICD-10-CM

## 2012-01-02 MED ORDER — AZITHROMYCIN 250 MG PO TABS: ORAL_TABLET | ORAL | Status: AC

## 2012-01-02 MED ORDER — ALBUTEROL SULFATE (2.5 MG/3ML) 0.083% IN NEBU: 2.5000 mg | INHALATION_SOLUTION | Freq: Once | RESPIRATORY_TRACT | Status: DC

## 2012-01-02 MED ORDER — IPRATROPIUM BROMIDE 0.06 % NA SOLN: 2.0000 | Freq: Four times a day (QID) | NASAL | Status: DC

## 2012-01-02 MED ORDER — ALBUTEROL SULFATE HFA 108 (90 BASE) MCG/ACT IN AERS: 2.0000 | INHALATION_SPRAY | RESPIRATORY_TRACT | Status: DC | PRN

## 2012-01-02 NOTE — Progress Notes (Signed)
  Subjective:    Patient ID: CATRINIA RACICOT, female    DOB: 05-09-1971, 41 y.o.   MRN: [id number]  HPI Ms. Suzie Portela c/o 2 weeks of URI symptoms.  Lingering congestion and cough.  Fits of coughing that is very productive at times.  Sinus drainage is thick and yellow.  Has a lot of facial pressure.  She is using OTC products with little relief.  Review of Systems  Constitutional: Negative for fever and chills.  HENT: Positive for congestion, sore throat, rhinorrhea and postnasal drip.   Respiratory: Positive for cough. Negative for wheezing.   Musculoskeletal: Positive for myalgias.  Neurological: Positive for headaches.       Objective:   Physical Exam  Constitutional: She appears well-developed and well-nourished.  HENT:  Right Ear: Tympanic membrane normal.  Left Ear: Tympanic membrane normal.  Nose: Mucosal edema and rhinorrhea present.  Mouth/Throat: Oropharynx is clear and moist.  Cardiovascular: Regular rhythm.  Tachycardia present.   Pulmonary/Chest: Effort normal and breath sounds normal.  Skin: Skin is warm.   Albuterol Nebulizer treatment Improved     Assessment & Plan:  URI Sinusitis Bronchospasm  Continue Mucinex D.  Add Zpack, Atrovent NS and ProAir.  Push fluids.  Return if sx worsen.

## 2012-01-09 ENCOUNTER — Encounter: Payer: Self-pay | Admitting: Family Medicine

## 2012-01-09 ENCOUNTER — Ambulatory Visit (INDEPENDENT_AMBULATORY_CARE_PROVIDER_SITE_OTHER): Payer: 59 | Admitting: Family Medicine

## 2012-01-09 VITALS — BP 144/92 | HR 105 | Temp 97.0°F | Resp 16 | Ht 63.0 in | Wt 285.6 lb

## 2012-01-09 DIAGNOSIS — M25519 Pain in unspecified shoulder: Secondary | ICD-10-CM

## 2012-01-09 DIAGNOSIS — J329 Chronic sinusitis, unspecified: Secondary | ICD-10-CM

## 2012-01-09 DIAGNOSIS — I10 Essential (primary) hypertension: Secondary | ICD-10-CM

## 2012-01-09 DIAGNOSIS — G4700 Insomnia, unspecified: Secondary | ICD-10-CM

## 2012-01-09 DIAGNOSIS — D573 Sickle-cell trait: Secondary | ICD-10-CM

## 2012-01-09 DIAGNOSIS — D649 Anemia, unspecified: Secondary | ICD-10-CM

## 2012-01-09 DIAGNOSIS — M25512 Pain in left shoulder: Secondary | ICD-10-CM

## 2012-01-09 MED ORDER — LOSARTAN POTASSIUM-HCTZ 100-12.5 MG PO TABS: 1.0000 | ORAL_TABLET | Freq: Every day | ORAL | Status: DC

## 2012-01-09 MED ORDER — LEVOFLOXACIN 500 MG PO TABS: 500.0000 mg | ORAL_TABLET | Freq: Every day | ORAL | Status: AC

## 2012-01-09 MED ORDER — ZOLPIDEM TARTRATE 10 MG PO TABS: 10.0000 mg | ORAL_TABLET | Freq: Every evening | ORAL | Status: DC | PRN

## 2012-01-09 NOTE — Progress Notes (Signed)
  Subjective:    Patient ID: [name], female    DOB: 02-14-1971, 41 y.o.   MRN: [id number]  HPI Patient presents with multiple concerns.  1) Recent sinusitis however still with significant congestion, pnd and cough. Cough worse early in the AM and expectorates brown and yellow sputum. Post tussive emesis.  (R) maxillary pain. Tactile fever.  Wheezing intermittantly. Recently completed ZPack with slight improvement in symptoms. Otic fullness with popping.  2) Insomnia- needs refill of Ambien  3) HTN Patient has been checking BP's regularly and all readings have been in normal range.             Recent labs were normal  4) Fe deficiency anemia- resolved  Review of Systems     Objective:   Physical Exam  Constitutional: She appears well-developed.  Neck: Neck supple. No thyromegaly present.  Cardiovascular: Normal rate, regular rhythm and normal heart sounds.        No lower extremity edema  Pulmonary/Chest: Effort normal and breath sounds normal.  Neurological: She is alert.  Skin: Skin is warm.  Psychiatric: She has a normal mood and affect.          Assessment & Plan:   1. Sinusitis  levofloxacin (LEVAQUIN) 500 MG tablet, Nasonex UAD, INB will add prednisone.  2. HTN (hypertension)  losartan-hydrochlorothiazide (HYZAAR) 100-12.5 MG per tablet, EKG 12-Lead, Lipid panel; Continue to monitor BP  3. Insomnia  zolpidem (AMBIEN) 10 MG tablet  4. Shoulder pain, left  Pt to schedule apt with Dr. Althea Charon

## 2012-01-10 LAB — LIPID PANEL
Cholesterol: 147 mg/dL (ref 0–200)
LDL Cholesterol: 78 mg/dL (ref 0–99)
VLDL: 30 mg/dL (ref 0–40)

## 2012-01-14 DIAGNOSIS — D649 Anemia, unspecified: Secondary | ICD-10-CM | POA: Insufficient documentation

## 2012-01-14 DIAGNOSIS — I10 Essential (primary) hypertension: Secondary | ICD-10-CM | POA: Insufficient documentation

## 2012-01-14 DIAGNOSIS — D573 Sickle-cell trait: Secondary | ICD-10-CM | POA: Insufficient documentation

## 2012-01-22 ENCOUNTER — Ambulatory Visit (INDEPENDENT_AMBULATORY_CARE_PROVIDER_SITE_OTHER): Payer: 59 | Admitting: Emergency Medicine

## 2012-01-22 VITALS — BP 138/91 | HR 97 | Temp 98.7°F | Resp 20 | Ht 63.0 in | Wt 285.0 lb

## 2012-01-22 DIAGNOSIS — F419 Anxiety disorder, unspecified: Secondary | ICD-10-CM

## 2012-01-22 DIAGNOSIS — F439 Reaction to severe stress, unspecified: Secondary | ICD-10-CM

## 2012-01-22 DIAGNOSIS — D649 Anemia, unspecified: Secondary | ICD-10-CM

## 2012-01-22 DIAGNOSIS — F411 Generalized anxiety disorder: Secondary | ICD-10-CM

## 2012-01-22 DIAGNOSIS — F430 Acute stress reaction: Secondary | ICD-10-CM

## 2012-01-22 MED ORDER — ALPRAZOLAM 0.5 MG PO TABS: 0.5000 mg | ORAL_TABLET | Freq: Every evening | ORAL | Status: AC | PRN

## 2012-01-22 NOTE — Progress Notes (Signed)
  Subjective:    Patient ID: [name], female    DOB: 23-Dec-1970, 41 y.o.   MRN: [id number]  HPI the patient enters for evaluation. She is undergoing a lot of stress. She is having difficulty with her 50 year old son. He has been referred to family services for a mental evaluation. There've been some issues with drugs and recently has become more physical at home.    Review of Systems noncontributory except as it relates to a history of iron deficiency anemia requiring intravenous iron injections.     Objective:   Physical Exam  HEENT exam unremarkable chest clear.      Assessment & Plan:  Assessment situational stress. Patient is working too many hours per week. He has a lot of financial obligations to her family. She is partially overcome multiple struggles and has done incredibly well. She plans on taking her son to get evaluated at family services. I've refilled her Xanax which she is taking for her stress. 2 days a week if needed for FMLA use. I also suggested she call the [facility] PT program and get some help regarding management of stress.

## 2012-02-09 ENCOUNTER — Ambulatory Visit: Payer: 59

## 2012-02-09 ENCOUNTER — Ambulatory Visit (INDEPENDENT_AMBULATORY_CARE_PROVIDER_SITE_OTHER): Payer: 59 | Admitting: Family Medicine

## 2012-02-09 VITALS — BP 147/97 | HR 112 | Temp 98.0°F | Resp 16 | Ht 63.0 in | Wt 274.0 lb

## 2012-02-09 DIAGNOSIS — M25569 Pain in unspecified knee: Secondary | ICD-10-CM

## 2012-02-09 MED ORDER — HYDROCODONE-ACETAMINOPHEN 5-325 MG PO TABS: 1.0000 | ORAL_TABLET | Freq: Four times a day (QID) | ORAL | Status: AC | PRN

## 2012-02-09 NOTE — Progress Notes (Signed)
Patient ID: [name] MRN: [id number], DOB: 08-04-1971, 41 y.o. Date of Encounter: 02/09/2012, 4:46 PM  Primary Physician: Tally Due, MD, MD  Chief Complaint: Left knee pain  HPI: 41 y.o. year old female with history below presents with 5 week history of left knee pain. 5 weeks prior she was attempting to kick her son's shoe down the basement stairs with the inside of her left foot. When she did this the shoe got caught along a lip of the flooring and did not go down the stairs. After doing this she noted a pain along the inside of her left knee. This pain has persisted over the past 5 weeks, and worsens with continued movement or stairs. If she is able to rest her pain greatly decreases. She has tried Mobic, Ultram,and ACE wraps without success. No prior injury or trauma to the affected joint.    No past medical history on file.   Home Meds: Prior to Admission medications   Medication Sig Start Date End Date Taking? Authorizing Provider  ALPRAZolam Prudy Feeler) 0.5 MG tablet Take 1 tablet (0.5 mg total) by mouth at bedtime as needed for sleep. 01/22/12 02/21/12 Yes Collene Gobble, MD  losartan-hydrochlorothiazide (HYZAAR) 100-12.5 MG per tablet Take 1 tablet by mouth daily. 01/09/12  Yes Dois Davenport, MD  meloxicam (MOBIC) 7.5 MG tablet Take 7.5 mg by mouth as needed.   Yes Historical Provider, MD  traMADol (ULTRAM) 50 MG tablet Take 50 mg by mouth every 6 (six) hours as needed.   Yes Historical Provider, MD  zolpidem (AMBIEN) 10 MG tablet Take 1 tablet (10 mg total) by mouth at bedtime as needed. 01/09/12  Yes Dois Davenport, MD  albuterol (PROVENTIL HFA;VENTOLIN HFA) 108 (90 BASE) MCG/ACT inhaler Inhale 2 puffs into the lungs every 4 (four) hours as needed for wheezing. 01/02/12 01/01/13  Pattricia Boss, PA-C  ipratropium (ATROVENT) 0.06 % nasal spray Place 2 sprays into the nose 4 (four) times daily. 01/02/12 01/01/13  Pattricia Boss, PA-C    Allergies:  Allergies  Allergen  Reactions  . Doxycycline   . Iron     History   Social History  . Marital Status: Single    Spouse Name: N/A    Number of Children: N/A  . Years of Education: N/A   Occupational History  . Not on file.   Social History Main Topics  . Smoking status: Current Everyday Smoker -- 20 years    Types: Cigarettes  . Smokeless tobacco: Not on file   Comment: smoke 5 cigarettes/day or prn  . Alcohol Use: Yes     sometimes  . Drug Use: No  . Sexually Active: Not on file   Other Topics Concern  . Not on file   Social History Narrative  . No narrative on file     Review of Systems: Constitutional: negative for chills, fever, night sweats, weight changes, or fatigue  HEENT: negative for vision changes, hearing loss, congestion, rhinorrhea, ST, epistaxis, or sinus pressure Cardiovascular: negative for chest pain or palpitations Respiratory: negative for hemoptysis, wheezing, shortness of breath, or cough Abdominal: negative for abdominal pain, nausea, vomiting, diarrhea, or constipation Dermatological: negative for rash Neurologic: negative for headache, dizziness, or syncope Ortho: see above All other systems reviewed and are otherwise negative with the exception to those above and in the HPI.   Physical Exam: Blood pressure 147/97, pulse 112, temperature 98 F (36.7 C), temperature source Oral, resp. rate 16, height  5\' 3"  (1.6 m), weight 274 lb (124.286 kg), last menstrual period 01/21/2012., Body mass index is 48.54 kg/(m^2). General: Well developed, well nourished, in no acute distress. Head: Normocephalic, atraumatic, eyes without discharge, sclera non-icteric, nares are without discharge.   Neck: Supple. No thyromegaly. Full ROM. No lymphadenopathy. Lungs: Breathing is unlabored. Heart: Normal rate. Msk:  Left knee with mild TTP medial aspect of the patella. No effusion. No TTP of the medial or lateral joint line. No Patellar deviation. Lachman's, drawer, varus, valgus  stress all equal bilaterally. No pain or pop with McMurray's. Strength and tone normal for age. No erythema or warmth.  Extremities/Skin: Warm and dry. No clubbing or cyanosis. No edema. No rashes or suspicious lesions. Neuro: Alert and oriented X 3. Moves all extremities spontaneously. Gait is normal. CNII-XII grossly in tact. Psych:  Responds to questions appropriately with a normal affect.      [facility] reading (PRIMARY) by  Dr. Hal Hope. Negative   ASSESSMENT AND PLAN:  41 y.o. year old female with left knee strain/pain -Hinged knee brace -Norco 5/325 mg #30 1 po q 4-6 hours prn pain noRF SED -Exercises discussed -If pain persists for a week further RTC, consider ortho evaluation  -Discussed with Dr. Hal Hope  Signed, Eula Listen, PA-C 02/09/2012 4:46 PM

## 2012-02-14 ENCOUNTER — Telehealth: Payer: Self-pay

## 2012-02-14 MED ORDER — ZOLPIDEM TARTRATE ER 12.5 MG PO TBCR: 12.5000 mg | EXTENDED_RELEASE_TABLET | Freq: Every evening | ORAL | Status: DC | PRN

## 2012-02-14 NOTE — Telephone Encounter (Signed)
Signed at tl desk

## 2012-02-14 NOTE — Telephone Encounter (Signed)
[name] reports that she still has 3 RFs on her reg Ambien, but it is no longer working well for her. She gets to sleep, but then wakes up Qhs at 3 am. She requests new Rx for Ambien CR to see if the extended release will keep her asleep longer. Pt uses [facility].

## 2012-02-14 NOTE — Telephone Encounter (Signed)
Pt

## 2012-02-26 ENCOUNTER — Ambulatory Visit (INDEPENDENT_AMBULATORY_CARE_PROVIDER_SITE_OTHER): Payer: 59 | Admitting: Emergency Medicine

## 2012-02-26 VITALS — BP 151/112 | HR 111 | Temp 98.4°F | Resp 16

## 2012-02-26 DIAGNOSIS — F329 Major depressive disorder, single episode, unspecified: Secondary | ICD-10-CM

## 2012-02-26 DIAGNOSIS — F3289 Other specified depressive episodes: Secondary | ICD-10-CM

## 2012-02-26 MED ORDER — ESCITALOPRAM OXALATE 10 MG PO TABS: 10.0000 mg | ORAL_TABLET | Freq: Every day | ORAL | Status: DC

## 2012-02-26 NOTE — Progress Notes (Signed)
  Subjective:    Patient ID: [name], female    DOB: [birth year], 41 y.o.   MRN: [id number]  HPI patient seen today after having an episode last week where she became short of breath with palpitations. This followed a call from her son had been arrested. She's been under great stress at home. Her son is currently in jail. Court hearings are tomorrow. She has when necessary 9 Xanax to take but has not been on SSRI to    Review of Systems primarily related to iron deficiency anemia related to heavy menstrual flow.     Objective:   Physical Exam Physical exam tearful female who is not in any distress.       Assessment & Plan:   Assessment situational anxiety depression with panic attacks. Have advised contact EAP person for cone. We'll start Lexapro 10 one a day. She is given a note for Friday and Monday for work. She has upraise went with her to take when necessary panic attacks.

## 2012-03-15 ENCOUNTER — Encounter: Payer: Self-pay | Admitting: *Deleted

## 2012-03-15 ENCOUNTER — Ambulatory Visit (INDEPENDENT_AMBULATORY_CARE_PROVIDER_SITE_OTHER): Payer: 59 | Admitting: Internal Medicine

## 2012-03-15 VITALS — BP 144/96 | HR 89 | Temp 98.7°F | Resp 16

## 2012-03-15 DIAGNOSIS — R269 Unspecified abnormalities of gait and mobility: Secondary | ICD-10-CM

## 2012-03-15 DIAGNOSIS — M25569 Pain in unspecified knee: Secondary | ICD-10-CM

## 2012-03-15 DIAGNOSIS — F40298 Other specified phobia: Secondary | ICD-10-CM

## 2012-03-15 DIAGNOSIS — M25562 Pain in left knee: Secondary | ICD-10-CM

## 2012-03-15 LAB — POCT CBC
Granulocyte percent: 68.8 %G (ref 37–80)
MID (cbc): 0.4 (ref 0–0.9)
POC Granulocyte: 6.7 (ref 2–6.9)
POC LYMPH PERCENT: 26.8 %L (ref 10–50)
Platelet Count, POC: 233 10*3/uL (ref 142–424)
RBC: 5.23 M/uL (ref 4.04–5.48)
RDW, POC: 15.2 %

## 2012-03-15 LAB — POCT SEDIMENTATION RATE: POCT SED RATE: 26 mm/hr — AB (ref 0–22)

## 2012-03-15 LAB — URIC ACID: Uric Acid, Serum: 5.7 mg/dL (ref 2.4–7.0)

## 2012-03-15 MED ORDER — ALPRAZOLAM 0.25 MG PO TABS
0.5000 mg | ORAL_TABLET | Freq: Once | ORAL | Status: AC
  Administered 2012-03-15: 0.5 mg via ORAL

## 2012-03-15 NOTE — Progress Notes (Signed)
  Subjective:    Patient ID: [name], female    DOB: [birth year], 41 y.o.   MRN: [id number]  HPI Knee pain progressively worse and now painfull walking, flexing and extending. Not red ,and no effusion seen. No hx of serious injury, see Dr. Lenord Fellers w/up 3/13 No hx of gout or fx hx.   Review of Systems     Objective:   Physical Exam L knee full rom with pain Tender to palpate medially and stress medially Slightly warm  Results for orders placed in visit on 03/15/12  POCT CBC      Component Value Range   WBC 9.7  4.6 - 10.2 (K/uL)   Lymph, poc 2.6  0.6 - 3.4    POC LYMPH PERCENT 26.8  10 - 50 (%L)   MID (cbc) 0.4  0 - 0.9    POC MID % 4.4  0 - 12 (%M)   POC Granulocyte 6.7  2 - 6.9    Granulocyte percent 68.8  37 - 80 (%G)   RBC 5.23  4.04 - 5.48 (M/uL)   Hemoglobin 13.2  12.2 - 16.2 (g/dL)   HCT, POC 78.2  95.6 - 47.9 (%)   MCV 79.0 (*) 80 - 97 (fL)   MCH, POC 25.2 (*) 27 - 31.2 (pg)   MCHC 32.0  31.8 - 35.4 (g/dL)   RDW, POC 21.3     Platelet Count, POC 233  142 - 424 (K/uL)   MPV 11.9  0 - 99.8 (fL)        Assessment & Plan:  Uric acid, sed rate, cbc Preop alprazolam .05 po --fear  RICE for 2d Knee injection depomedrol 80mg  If no improvement to see Dr. Althea Charon

## 2012-03-15 NOTE — Patient Instructions (Signed)
Knee Exercises EXERCISES RANGE OF MOTION(ROM) AND STRETCHING EXERCISES These exercises may help you when beginning to rehabilitate your injury. Your symptoms may resolve with or without further involvement from your physician, physical therapist or athletic trainer. While completing these exercises, remember:   Restoring tissue flexibility helps normal motion to return to the joints. This allows healthier, less painful movement and activity.   An effective stretch should be held for at least 30 seconds.   A stretch should never be painful. You should only feel a gentle lengthening or release in the stretched tissue.  STRETCH - Knee Extension, Prone  Lie on your stomach on a firm surface, such as a bed or countertop. Place your right / left knee and leg just beyond the edge of the surface. You may wish to place a towel under the far end of your right / left thigh for comfort.   Relax your leg muscles and allow gravity to straighten your knee. Your clinician may advise you to add an ankle weight if more resistance is helpful for you.   You should feel a stretch in the back of your right / left knee. Hold this position for __________ seconds.  Repeat __________ times. Complete this stretch __________ times per day. * Your physician, physical therapist or athletic trainer may ask you to add ankle weight to enhance your stretch.  RANGE OF MOTION - Knee Flexion, Active  Lie on your back with both knees straight. (If this causes back discomfort, bend your opposite knee, placing your foot flat on the floor.)   Slowly slide your heel back toward your buttocks until you feel a gentle stretch in the front of your knee or thigh.   Hold for __________ seconds. Slowly slide your heel back to the starting position.  Repeat __________ times. Complete this exercise __________ times per day.  STRETCH - Quadriceps, Prone   Lie on your stomach on a firm surface, such as a bed or padded floor.   Bend your  right / left knee and grasp your ankle. If you are unable to reach, your ankle or pant leg, use a belt around your foot to lengthen your reach.   Gently pull your heel toward your buttocks. Your knee should not slide out to the side. You should feel a stretch in the front of your thigh and/or knee.   Hold this position for __________ seconds.  Repeat __________ times. Complete this stretch __________ times per day.  STRETCH - Hamstrings, Supine   Lie on your back. Loop a belt or towel over the ball of your right / left foot.   Straighten your right / left knee and slowly pull on the belt to raise your leg. Do not allow the right / left knee to bend. Keep your opposite leg flat on the floor.   Raise the leg until you feel a gentle stretch behind your right / left knee or thigh. Hold this position for __________ seconds.  Repeat __________ times. Complete this stretch __________ times per day.  STRENGTHENING EXERCISES These exercises may help you when beginning to rehabilitate your injury. They may resolve your symptoms with or without further involvement from your physician, physical therapist or athletic trainer. While completing these exercises, remember:   Muscles can gain both the endurance and the strength needed for everyday activities through controlled exercises.   Complete these exercises as instructed by your physician, physical therapist or athletic trainer. Progress the resistance and repetitions only as guided.  You may experience muscle soreness or fatigue, but the pain or discomfort you are trying to eliminate should never worsen during these exercises. If this pain does worsen, stop and make certain you are following the directions exactly. If the pain is still present after adjustments, discontinue the exercise until you can discuss the trouble with your clinician.  STRENGTH - Quadriceps, Isometrics  Lie on your back with your right / left leg extended and your opposite knee  bent.   Gradually tense the muscles in the front of your right / left thigh. You should see either your knee cap slide up toward your hip or increased dimpling just above the knee. This motion will push the back of the knee down toward the floor/mat/bed on which you are lying.   Hold the muscle as tight as you can without increasing your pain for __________ seconds.   Relax the muscles slowly and completely in between each repetition.  Repeat __________ times. Complete this exercise __________ times per day.  STRENGTH - Quadriceps, Short Arcs   Lie on your back. Place a __________ inch towel roll under your knee so that the knee slightly bends.   Raise only your lower leg by tightening the muscles in the front of your thigh. Do not allow your thigh to rise.   Hold this position for __________ seconds.  Repeat __________ times. Complete this exercise __________ times per day.  OPTIONAL ANKLE WEIGHTS: Begin with ____________________, but DO NOT exceed ____________________. Increase in 1 pound/0.5 kilogram increments.  STRENGTH - Quadriceps, Straight Leg Raises  Quality counts! Watch for signs that the quadriceps muscle is working to insure you are strengthening the correct muscles and not "cheating" by substituting with healthier muscles.  Lay on your back with your right / left leg extended and your opposite knee bent.   Tense the muscles in the front of your right / left thigh. You should see either your knee cap slide up or increased dimpling just above the knee. Your thigh may even quiver.   Tighten these muscles even more and raise your leg 4 to 6 inches off the floor. Hold for __________ seconds.   Keeping these muscles tense, lower your leg.   Relax the muscles slowly and completely in between each repetition.  Repeat __________ times. Complete this exercise __________ times per day.  STRENGTH - Hamstring, Curls  Lay on your stomach with your legs extended. (If you lay on a bed,  your feet may hang over the edge.)   Tighten the muscles in the back of your thigh to bend your right / left knee up to 90 degrees. Keep your hips flat on the bed/floor.   Hold this position for __________ seconds.   Slowly lower your leg back to the starting position.  Repeat __________ times. Complete this exercise __________ times per day.  OPTIONAL ANKLE WEIGHTS: Begin with ____________________, but DO NOT exceed ____________________. Increase in 1 pound/0.5 kilogram increments.  STRENGTH - Quadriceps, Squats  Stand in a door frame so that your feet and knees are in line with the frame.   Use your hands for balance, not support, on the frame.   Slowly lower your weight, bending at the hips and knees. Keep your lower legs upright so that they are parallel with the door frame. Squat only within the range that does not increase your knee pain. Never let your hips drop below your knees.   Slowly return upright, pushing with your legs, not pulling with  your hands.  Repeat __________ times. Complete this exercise __________ times per day.  STRENGTH - Quadriceps, Wall Slides  Follow guidelines for form closely. Increased knee pain often results from poorly placed feet or knees.  Lean against a smooth wall or door and walk your feet out 18-24 inches. Place your feet hip-width apart.   Slowly slide down the wall or door until your knees bend __________ degrees.* Keep your knees over your heels, not your toes, and in line with your hips, not falling to either side.   Hold for __________ seconds. Stand up to rest for __________ seconds in between each repetition.  Repeat __________ times. Complete this exercise __________ times per day. * Your physician, physical therapist or athletic trainer will alter this angle based on your symptoms and progress. Document Released: 09/14/2005 Document Revised: 10/20/2011 Document Reviewed: 02/12/2009 [facility] Patient Information 2012 [place], [place].

## 2012-03-28 ENCOUNTER — Telehealth: Payer: Self-pay

## 2012-03-28 NOTE — Telephone Encounter (Signed)
Dr. Cleta Alberts, [name] wants to know if you can add Wellbutrin to her regimen because Dr. Hal Hope told her that it would help with sexual side effects that she is having with the Lexapro. Please see her for more details. She wanted to know that she still has 10 RF's on the Lexapro

## 2012-03-29 ENCOUNTER — Other Ambulatory Visit: Payer: Self-pay | Admitting: Emergency Medicine

## 2012-03-29 DIAGNOSIS — F329 Major depressive disorder, single episode, unspecified: Secondary | ICD-10-CM

## 2012-03-29 MED ORDER — BUPROPION HCL ER (XL) 150 MG PO TB24: 150.0000 mg | ORAL_TABLET | Freq: Every day | ORAL | Status: DC

## 2012-03-29 NOTE — Telephone Encounter (Signed)
Please notify [name] her medications have been sent in.

## 2012-03-29 NOTE — Telephone Encounter (Signed)
Pt.notified

## 2012-05-09 ENCOUNTER — Telehealth: Payer: Self-pay

## 2012-05-09 MED ORDER — ALPRAZOLAM 0.5 MG PO TABS: 0.5000 mg | ORAL_TABLET | Freq: Every evening | ORAL | Status: DC | PRN

## 2012-05-09 NOTE — Telephone Encounter (Signed)
Done and printed

## 2012-05-09 NOTE — Telephone Encounter (Signed)
Pt needs a RF of her Xanax.

## 2012-06-14 ENCOUNTER — Ambulatory Visit (INDEPENDENT_AMBULATORY_CARE_PROVIDER_SITE_OTHER): Payer: 59 | Admitting: Emergency Medicine

## 2012-06-14 ENCOUNTER — Ambulatory Visit: Payer: 59

## 2012-06-14 VITALS — BP 140/98 | HR 116 | Temp 99.1°F | Resp 20 | Ht 62.0 in | Wt 285.0 lb

## 2012-06-14 DIAGNOSIS — R059 Cough, unspecified: Secondary | ICD-10-CM

## 2012-06-14 DIAGNOSIS — R079 Chest pain, unspecified: Secondary | ICD-10-CM

## 2012-06-14 DIAGNOSIS — M79609 Pain in unspecified limb: Secondary | ICD-10-CM

## 2012-06-14 DIAGNOSIS — M79603 Pain in arm, unspecified: Secondary | ICD-10-CM

## 2012-06-14 DIAGNOSIS — R05 Cough: Secondary | ICD-10-CM

## 2012-06-14 DIAGNOSIS — H109 Unspecified conjunctivitis: Secondary | ICD-10-CM

## 2012-06-14 MED ORDER — OFLOXACIN 0.3 % OP SOLN: 1.0000 [drp] | Freq: Four times a day (QID) | OPHTHALMIC | Status: AC

## 2012-06-14 NOTE — Progress Notes (Signed)
  Subjective:    Patient ID: [name], female    DOB: 02-17-1971, 41 y.o.   MRN: [id number]  HPI With a three-day history of pain and discomfort in her right eye associated with a crusty drainage. She has noted a significant decrease in vision in her right associated with this she has a history of similar problems a few months ago which cleared with Ocuflox the   Review of Systems     Objective:   Physical Exam  Constitutional: She appears well-developed and well-nourished.  Eyes: Pupils are equal, round, and reactive to light.       The conjunctiva is slightly inflamed. The rest of the eye examination is normal  Neck: Normal range of motion.  Cardiovascular: Normal rate and regular rhythm.    EKG normal [facility] reading (PRIMARY) by  Dr. Cleta Alberts no acute disease        Assessment & Plan:  Patient here with a mild conjunctivitis of the right. I she also has had some left arm pain which is nonexertional not associated with any chest pain. Her EKG and chest x-ray are within normal limits. She is adamant about not seeing any cardiologist right now. She was given the # for [facility] for counseling .

## 2012-06-20 ENCOUNTER — Other Ambulatory Visit: Payer: Self-pay

## 2012-06-20 NOTE — Telephone Encounter (Signed)
[name] is requesting a RF of her Xanax and Ambien.

## 2012-06-20 NOTE — Telephone Encounter (Signed)
Dr Cleta Alberts, Karoline Caldwell would like as many RFs as possible so that she will not have to ask every month. Please send to [facility] out[facility] on [address]

## 2012-06-21 NOTE — Telephone Encounter (Signed)
OK 3 Refills on both

## 2012-06-22 MED ORDER — ZOLPIDEM TARTRATE ER 12.5 MG PO TBCR: 12.5000 mg | EXTENDED_RELEASE_TABLET | Freq: Every evening | ORAL | Status: DC | PRN

## 2012-06-22 MED ORDER — ALPRAZOLAM 0.5 MG PO TABS: 0.5000 mg | ORAL_TABLET | Freq: Every evening | ORAL | Status: DC | PRN

## 2012-06-22 NOTE — Telephone Encounter (Signed)
Called and gave pharmacist RFs on both Rxs as OK'd by Dr Cleta Alberts. Notified pt.

## 2012-09-12 ENCOUNTER — Ambulatory Visit (INDEPENDENT_AMBULATORY_CARE_PROVIDER_SITE_OTHER): Payer: 59 | Admitting: Emergency Medicine

## 2012-09-12 VITALS — BP 130/68 | HR 98 | Temp 98.9°F | Resp 18 | Ht 63.0 in | Wt 288.0 lb

## 2012-09-12 DIAGNOSIS — B9789 Other viral agents as the cause of diseases classified elsewhere: Secondary | ICD-10-CM

## 2012-09-12 DIAGNOSIS — R350 Frequency of micturition: Secondary | ICD-10-CM

## 2012-09-12 DIAGNOSIS — B349 Viral infection, unspecified: Secondary | ICD-10-CM

## 2012-09-12 DIAGNOSIS — R51 Headache: Secondary | ICD-10-CM

## 2012-09-12 DIAGNOSIS — R0989 Other specified symptoms and signs involving the circulatory and respiratory systems: Secondary | ICD-10-CM

## 2012-09-12 DIAGNOSIS — R42 Dizziness and giddiness: Secondary | ICD-10-CM

## 2012-09-12 LAB — POCT CBC
Granulocyte percent: 68.4 %G (ref 37–80)
HCT, POC: 40 % (ref 37.7–47.9)
Hemoglobin: 11.9 g/dL — AB (ref 12.2–16.2)
Lymph, poc: 2.7 (ref 0.6–3.4)
MCH, POC: 23.7 pg — AB (ref 27–31.2)
MCHC: 29.8 g/dL — AB (ref 31.8–35.4)
MCV: 79.6 fL — AB (ref 80–97)
MID (cbc): 0.6 (ref 0–0.9)
MPV: 10.7 fL (ref 0–99.8)
POC Granulocyte: 7.1 — AB (ref 2–6.9)
POC LYMPH PERCENT: 25.6 %L (ref 10–50)
POC MID %: 6 %M (ref 0–12)
Platelet Count, POC: 247 10*3/uL (ref 142–424)
RBC: 5.02 M/uL (ref 4.04–5.48)
RDW, POC: 16.2 %
WBC: 10.4 10*3/uL — AB (ref 4.6–10.2)

## 2012-09-12 LAB — GLUCOSE, POCT (MANUAL RESULT ENTRY): POC Glucose: 102 mg/dl — AB (ref 70–99)

## 2012-09-12 LAB — POCT UA - MICROSCOPIC ONLY
Bacteria, U Microscopic: NEGATIVE
Casts, Ur, LPF, POC: NEGATIVE
Crystals, Ur, HPF, POC: NEGATIVE
Mucus, UA: NEGATIVE
RBC, urine, microscopic: NEGATIVE
WBC, Ur, HPF, POC: NEGATIVE
Yeast, UA: NEGATIVE

## 2012-09-12 LAB — POCT URINALYSIS DIPSTICK
Bilirubin, UA: NEGATIVE
Glucose, UA: NEGATIVE
Ketones, UA: NEGATIVE
Leukocytes, UA: NEGATIVE
Nitrite, UA: NEGATIVE
Protein, UA: NEGATIVE
Spec Grav, UA: 1.025
Urobilinogen, UA: 0.2
pH, UA: 5.5

## 2012-09-12 LAB — POCT INFLUENZA A/B
Influenza A, POC: NEGATIVE
Influenza B, POC: NEGATIVE

## 2012-09-12 MED ORDER — KETOROLAC TROMETHAMINE 60 MG/2ML IM SOLN
60.0000 mg | Freq: Once | INTRAMUSCULAR | Status: AC
  Administered 2012-09-12: 60 mg via INTRAMUSCULAR

## 2012-09-12 NOTE — Progress Notes (Signed)
Subjective:    Patient ID: [name], female    DOB: 07/29/1971, 41 y.o.   MRN: [id number]  HPI 41 year old female presents with acute onset of headache, dizziness, and slight nausea.  Admits to slight sore throat and dry cough. No SOB, chest pain, vision changes, abdominal pain or emesis. No fevers but she does feel chilled.  Took ibuprofen this a.m which has not helped much.  Admits to slight suprapubic pressure and urinary frequency so also would like urine checked.      Review of Systems  Constitutional: Positive for chills and fatigue. Negative for fever.  HENT: Positive for sore throat. Negative for ear pain, neck pain, neck stiffness and postnasal drip.   Eyes: Negative for pain and visual disturbance.  Respiratory: Negative for cough, shortness of breath and wheezing.   Gastrointestinal: Positive for nausea. Negative for vomiting and abdominal pain.  Skin: Negative for rash.  Neurological: Positive for dizziness, light-headedness and headaches.  All other systems reviewed and are negative.       Objective:   Physical Exam  Constitutional: She is oriented to person, place, and time. She appears well-developed.  HENT:  Head: Normocephalic and atraumatic.  Right Ear: External ear normal.  Left Ear: External ear normal.  Mouth/Throat: No oropharyngeal exudate.  Eyes: Conjunctivae normal and EOM are normal. Pupils are equal, round, and reactive to light.  Neck: Normal range of motion.  Cardiovascular: Normal rate, regular rhythm and normal heart sounds.   Pulmonary/Chest: Effort normal and breath sounds normal.  Lymphadenopathy:    She has no cervical adenopathy.  Neurological: She is alert and oriented to person, place, and time. She has normal strength.       Unsteady gait due to dizziness  Psychiatric: She has a normal mood and affect. Her behavior is normal. Judgment and thought content normal.    Results for orders placed in visit on 09/12/12  POCT CBC   Component Value Range   WBC 10.4 (*) 4.6 - 10.2 K/uL   Lymph, poc 2.7  0.6 - 3.4   POC LYMPH PERCENT 25.6  10 - 50 %L   MID (cbc) 0.6  0 - 0.9   POC MID % 6.0  0 - 12 %M   POC Granulocyte 7.1 (*) 2 - 6.9   Granulocyte percent 68.4  37 - 80 %G   RBC 5.02  4.04 - 5.48 M/uL   Hemoglobin 11.9 (*) 12.2 - 16.2 g/dL   HCT, POC 78.4  69.6 - 47.9 %   MCV 79.6 (*) 80 - 97 fL   MCH, POC 23.7 (*) 27 - 31.2 pg   MCHC 29.8 (*) 31.8 - 35.4 g/dL   RDW, POC 29.5     Platelet Count, POC 247  142 - 424 K/uL   MPV 10.7  0 - 99.8 fL  POCT UA - MICROSCOPIC ONLY      Component Value Range   WBC, Ur, HPF, POC neg     RBC, urine, microscopic neg     Bacteria, U Microscopic neg     Mucus, UA neg     Epithelial cells, urine per micros 0-2     Crystals, Ur, HPF, POC neg     Casts, Ur, LPF, POC neg     Yeast, UA neg    POCT URINALYSIS DIPSTICK      Component Value Range   Color, UA yellow     Clarity, UA clear     Glucose, UA  neg     Bilirubin, UA neg     Ketones, UA neg     Spec Grav, UA 1.025     Blood, UA trace     pH, UA 5.5     Protein, UA neg     Urobilinogen, UA 0.2     Nitrite, UA neg     Leukocytes, UA Negative    GLUCOSE, POCT (MANUAL RESULT ENTRY)      Component Value Range   POC Glucose 102 (*) 70 - 99 mg/dl  POCT INFLUENZA A/B      Component Value Range   Influenza A, POC Negative     Influenza B, POC Negative           Assessment & Plan:   1. Dizziness  POCT CBC, POCT glucose (manual entry)  2. Urinary frequency  POCT UA - Microscopic Only, POCT urinalysis dipstick  3. Headache  ketorolac (TORADOL) injection 60 mg  4. Viral illness  POCT Influenza A/B  Patient given 1L of fluids in office today. Feels and appears much better s/p hydration Continue to push fluids Declined Zofran at this time Tylenol or Motrin prn Follow up if symptoms worsen or fail to improve

## 2012-09-17 ENCOUNTER — Other Ambulatory Visit: Payer: Self-pay | Admitting: Emergency Medicine

## 2012-09-17 DIAGNOSIS — F419 Anxiety disorder, unspecified: Secondary | ICD-10-CM

## 2012-09-17 NOTE — Telephone Encounter (Signed)
Message copied by Collene Gobble on Mon Sep 17, 2012  9:05 AM ------      Message from: Leonia Reeves      Created: Sun Sep 16, 2012  6:09 PM       Dr Cleta Alberts,      Pt needs a refill on her xanax for next month. She just picked up her last RX on Friday 11/1 for this month.      She also needs a refill on her ambien CR, she only has five pills left.

## 2012-09-17 NOTE — Telephone Encounter (Signed)
Please call patient and let her know we will refill her medications. Please refill her Xanax as previously instructed. She can be on Ambien CR 6.25 one q. at bedtime #310 one refill.

## 2012-09-17 NOTE — Telephone Encounter (Signed)
Dr Cleta Alberts, I have put in her Rx for her, however the dose on Ambien CR at last Rx was 12.5. Do you want 6.25 or 12.5? I pended the Rx, if you want to decrease to 6.25, change then print and sign. Amy

## 2012-09-18 ENCOUNTER — Telehealth: Payer: Self-pay | Admitting: Family Medicine

## 2012-09-18 ENCOUNTER — Telehealth: Payer: Self-pay | Admitting: Radiology

## 2012-09-18 MED ORDER — ALPRAZOLAM 0.5 MG PO TABS: 0.5000 mg | ORAL_TABLET | Freq: Every evening | ORAL | Status: DC | PRN

## 2012-09-18 MED ORDER — ZOLPIDEM TARTRATE ER 6.25 MG PO TBCR: EXTENDED_RELEASE_TABLET | ORAL | Status: DC

## 2012-09-18 NOTE — Telephone Encounter (Signed)
FDA decrease the recommend dose for females to 6.25 from the 12.5. She needs to take to 6.25 of Ambien at night.

## 2012-09-18 NOTE — Telephone Encounter (Signed)
Spoke with [facility] Alprazolam 0.5mg  1 po qhs prn #30 0 refills and Ambien CR 6.25mg  1 po qhs prn #30 1R

## 2012-09-24 NOTE — Telephone Encounter (Signed)
Patient was advised of dose changes per Dr Cleta Alberts needs lower dose/ new recommendation for female patients.

## 2012-11-15 ENCOUNTER — Telehealth: Payer: Self-pay | Admitting: Family Medicine

## 2012-11-15 DIAGNOSIS — F419 Anxiety disorder, unspecified: Secondary | ICD-10-CM

## 2012-11-15 NOTE — Telephone Encounter (Signed)
[name] needs a refill on her Ambien 6.25mg  and Xanax 0.5mg  please. [facility]

## 2012-11-16 ENCOUNTER — Telehealth: Payer: Self-pay | Admitting: Family Medicine

## 2012-11-16 MED ORDER — ALPRAZOLAM 0.5 MG PO TABS: 0.5000 mg | ORAL_TABLET | Freq: Every evening | ORAL | Status: DC | PRN

## 2012-11-16 MED ORDER — ZOLPIDEM TARTRATE ER 6.25 MG PO TBCR: EXTENDED_RELEASE_TABLET | ORAL | Status: DC

## 2012-11-16 NOTE — Telephone Encounter (Signed)
Please call in refills on Ambien and Xanax for [name]

## 2012-11-16 NOTE — Telephone Encounter (Signed)
Refills on meds, ambien and xanax

## 2012-11-16 NOTE — Telephone Encounter (Signed)
Per Dr. Cleta Alberts pt can have 3 refills.  rx's called in and pt notified.

## 2012-11-29 ENCOUNTER — Ambulatory Visit (INDEPENDENT_AMBULATORY_CARE_PROVIDER_SITE_OTHER): Payer: 59 | Admitting: Family Medicine

## 2012-11-29 VITALS — BP 136/95 | HR 101 | Temp 99.8°F | Resp 16 | Ht 63.0 in | Wt 280.0 lb

## 2012-11-29 DIAGNOSIS — J40 Bronchitis, not specified as acute or chronic: Secondary | ICD-10-CM

## 2012-11-29 DIAGNOSIS — R05 Cough: Secondary | ICD-10-CM

## 2012-11-29 DIAGNOSIS — J069 Acute upper respiratory infection, unspecified: Secondary | ICD-10-CM

## 2012-11-29 DIAGNOSIS — R059 Cough, unspecified: Secondary | ICD-10-CM

## 2012-11-29 MED ORDER — LEVOFLOXACIN 500 MG PO TABS: 500.0000 mg | ORAL_TABLET | Freq: Every day | ORAL | Status: DC

## 2012-11-29 NOTE — Progress Notes (Signed)
[facility]:  Office Visit  Chief Complaint:  Chief Complaint  Patient presents with  . Cough    X 1 week    HPI: [name] is a 42 y.o. female who complains of  1 week h/o cough. White sputum production. Denies fevers, chills. Has allergies, denies asthma. She has bronchitis yearly, smoker 2-3 cigs daily. Has not tried anything. Denies fevers, chills, msk aches. Denies sinus tenderness, ear pain or rhinorrhea.  Past Medical History  Diagnosis Date  . Hypertension   . Smoker   . Allergy   . Anemia   . Depression   . Anxiety   . Sickle cell trait    Past Surgical History  Procedure Date  . Cervical ablation   . Tubal ligation   . Cholecystectomy    History   Social History  . Marital Status: Single    Spouse Name: N/A    Number of Children: N/A  . Years of Education: N/A   Social History Main Topics  . Smoking status: Current Every Day Smoker -- 20 years    Types: Cigarettes  . Smokeless tobacco: None     Comment: smoke 5 cigarettes/day or prn  . Alcohol Use: Yes     Comment: sometimes  . Drug Use: No  . Sexually Active: Yes    Birth Control/ Protection: Condom   Other Topics Concern  . None   Social History Narrative  . None   Family History  Problem Relation Age of Onset  . Mental illness Mother   . ADD / ADHD Son    Allergies  Allergen Reactions  . Doxycycline   . Iron    Prior to Admission medications   Medication Sig Start Date End Date Taking? Authorizing Provider  buPROPion (WELLBUTRIN XL) 150 MG 24 hr tablet Take 1 tablet (150 mg total) by mouth daily. 03/29/12 03/29/13 Yes Collene Gobble, MD  ipratropium (ATROVENT) 0.06 % nasal spray Place 2 sprays into the nose 4 (four) times daily. 01/02/12 01/01/13 Yes Pattricia Boss, PA-C  losartan-hydrochlorothiazide (HYZAAR) 100-12.5 MG per tablet Take 1 tablet by mouth daily. 01/09/12  Yes Dois Davenport, MD  zolpidem (AMBIEN CR) 6.25 MG CR tablet Take one at bedtime 11/16/12  Yes  Collene Gobble, MD  albuterol (PROVENTIL HFA;VENTOLIN HFA) 108 (90 BASE) MCG/ACT inhaler Inhale 2 puffs into the lungs every 4 (four) hours as needed for wheezing. 01/02/12 01/01/13  Pattricia Boss, PA-C  ALPRAZolam Prudy Feeler) 0.5 MG tablet Take 1 tablet (0.5 mg total) by mouth at bedtime as needed. 11/16/12   Collene Gobble, MD  escitalopram (LEXAPRO) 10 MG tablet Take 1 tablet (10 mg total) by mouth daily. 02/26/12 05/26/12  Collene Gobble, MD  meloxicam (MOBIC) 7.5 MG tablet Take 7.5 mg by mouth as needed.    Historical Provider, MD  traMADol (ULTRAM) 50 MG tablet Take 50 mg by mouth every 6 (six) hours as needed.    Historical Provider, MD  zolpidem (AMBIEN CR) 12.5 MG CR tablet Take 1 tablet (12.5 mg total) by mouth at bedtime as needed for sleep. 02/14/12 03/15/12  Makaley Storts P Aayden Cefalu, DO  zolpidem (AMBIEN) 10 MG tablet Take 1 tablet (10 mg total) by mouth at bedtime as needed. 01/09/12   Dois Davenport, MD     ROS: The patient denies fevers, chills, night sweats, unintentional weight loss, chest pain, palpitations, wheezing, dyspnea on exertion, nausea, vomiting, abdominal pain, dysuria, hematuria, melena, numbness, weakness,  or tingling.   All other systems have been reviewed and were otherwise negative with the exception of those mentioned in the HPI and as above.    PHYSICAL EXAM: Filed Vitals:   11/29/12 1611  BP: 136/95  Pulse: 101  Temp: 99.8 F (37.7 C)  Resp: 16   Filed Vitals:   11/29/12 1611  Height: 5\' 3"  (1.6 m)  Weight: 280 lb (127.007 kg)   Body mass index is 49.60 kg/(m^2).  General: Alert, no acute distress HEENT:  Normocephalic, atraumatic, oropharynx patent. TM nl. No sinus tenderness, no exudates. Erythematous throat, no exudates Cardiovascular:  Regular rate and rhythm, no rubs murmurs or gallops.  No Carotid bruits, radial pulse intact. No pedal edema.  Respiratory: Clear to auscultation bilaterally.  No wheezes, rales, or rhonchi.  No cyanosis, no use of accessory  musculature GI: No organomegaly, abdomen is soft and non-tender, positive bowel sounds.  No masses. Skin: No rashes. Neurologic: Facial musculature symmetric. Psychiatric: Patient is appropriate throughout our interaction. Lymphatic: No cervical lymphadenopathy Musculoskeletal: Gait intact.   LABS:    EKG/XRAY:   Primary read interpreted by Dr. Conley Rolls at [facility].   ASSESSMENT/PLAN: Encounter Diagnoses  Name Primary?  . Cough Yes  . Acute URI   . Bronchitis    Acute Bronchitis with h/o smoking.  Decline Xray Decline albuterol inh Rx Levaquin x 7 days F/u prn   Denora Wysocki PHUONG, DO 11/29/2012 4:56 PM

## 2012-11-30 ENCOUNTER — Ambulatory Visit (INDEPENDENT_AMBULATORY_CARE_PROVIDER_SITE_OTHER): Payer: 59 | Admitting: Physician Assistant

## 2012-11-30 ENCOUNTER — Encounter: Payer: Self-pay | Admitting: Physician Assistant

## 2012-11-30 VITALS — BP 134/91 | HR 99 | Temp 98.5°F | Resp 16

## 2012-11-30 DIAGNOSIS — R251 Tremor, unspecified: Secondary | ICD-10-CM

## 2012-11-30 DIAGNOSIS — R5383 Other fatigue: Secondary | ICD-10-CM

## 2012-11-30 DIAGNOSIS — D649 Anemia, unspecified: Secondary | ICD-10-CM

## 2012-11-30 DIAGNOSIS — R259 Unspecified abnormal involuntary movements: Secondary | ICD-10-CM

## 2012-11-30 DIAGNOSIS — R5381 Other malaise: Secondary | ICD-10-CM

## 2012-11-30 LAB — GLUCOSE, POCT (MANUAL RESULT ENTRY): POC Glucose: 97 mg/dl (ref 70–99)

## 2012-11-30 LAB — POCT CBC
HCT, POC: 45.2 % (ref 37.7–47.9)
MCH, POC: 24.1 pg — AB (ref 27–31.2)
MCV: 80.5 fL (ref 80–97)
MID (cbc): 0.8 (ref 0–0.9)
POC LYMPH PERCENT: 29.7 %L (ref 10–50)
Platelet Count, POC: 281 10*3/uL (ref 142–424)
RDW, POC: 16.7 %
WBC: 12 10*3/uL — AB (ref 4.6–10.2)

## 2012-11-30 NOTE — Progress Notes (Signed)
[address] [place] [address]   Phone [phone]   Subjective:    Patient ID: [name], female    DOB: 1971/06/24, 42 y.o.   MRN: [id number]  HPI  Pt presents to clinic with loose stool diarrhea since last pm.  She has normal BM in the am and last pm when she had a loose stool that was very strange for her - then she was woken several times during the night and her stools are getting looser.  She now has started to feel bad.  She was seen yesterday and was Rx Levaquin but she has not filled it yet for her cough.  She has no nausea or vomiting and no abd cramping.  She has had no recent travel.  No sick contacts at home.  Review of Systems  Constitutional: Negative for fever and chills. Appetite change: decreased appetite today.  Respiratory: Positive for cough.   Gastrointestinal: Positive for diarrhea. Negative for nausea, vomiting and abdominal pain.       Objective:   Physical Exam  Vitals reviewed. Constitutional: She is oriented to person, place, and time. She appears well-developed and well-nourished.       Looks like she does not feel well.  HENT:  Head: Normocephalic and atraumatic.  Right Ear: External ear normal.  Left Ear: External ear normal.  Eyes: Conjunctivae normal are normal.  Cardiovascular: Normal rate, regular rhythm and normal heart sounds.   Pulmonary/Chest: Effort normal and breath sounds normal.  Abdominal: Soft. There is no tenderness.  Neurological: She is alert and oriented to person, place, and time.  Skin: Skin is warm and dry.  Psychiatric: She has a normal mood and affect. Her behavior is normal. Judgment and thought content normal.   Results for orders placed in visit on 11/30/12  POCT CBC      Component Value Range   WBC 12.0 (*) 4.6 - 10.2 K/uL   Lymph, poc 3.6 (*) 0.6 - 3.4   POC LYMPH PERCENT 29.7  10 - 50 %L   MID (cbc) 0.8  0 - 0.9   POC MID % 6.4  0 - 12 %M   POC Granulocyte 7.7 (*) 2 - 6.9   Granulocyte percent 63.9   37 - 80 %G   RBC 5.61 (*) 4.04 - 5.48 M/uL   Hemoglobin 13.5  12.2 - 16.2 g/dL   HCT, POC 62.9  52.8 - 47.9 %   MCV 80.5  80 - 97 fL   MCH, POC 24.1 (*) 27 - 31.2 pg   MCHC 29.9 (*) 31.8 - 35.4 g/dL   RDW, POC 41.3     Platelet Count, POC 281  142 - 424 K/uL   MPV 10.4  0 - 99.8 fL  GLUCOSE, POCT (MANUAL RESULT ENTRY)      Component Value Range   POC Glucose 97  70 - 99 mg/dl  POCT GLYCOSYLATED HEMOGLOBIN (HGB A1C)      Component Value Range   Hemoglobin A1C 5.9          Assessment & Plan:   1. Shakes  POCT CBC, POCT glucose (manual entry), POCT glycosylated hemoglobin (Hb A1C)  2. Anemia    3. Fatigue     1- pt started to feel poorly last pm and she is getting worse with her diarrhea - most likely an early GI illness.  I suggested that patient not start the Levaquin for the next couple of days - due to minimally productive  cough and the fact that it may maker her diarrhea worse.  She should try OTC imodium.  I think her elevated white count is due to reaction and viral illness when the leukocytosis is present.  I do suggest that in a week when the patient is feeling better to have a repeat CBC to document a normal WBC. 2- for now pt is not anemic which is great 3- pt to rest - push fluids

## 2012-12-06 ENCOUNTER — Telehealth: Payer: Self-pay | Admitting: Family Medicine

## 2012-12-06 DIAGNOSIS — I10 Essential (primary) hypertension: Secondary | ICD-10-CM

## 2012-12-06 MED ORDER — LOSARTAN POTASSIUM-HCTZ 100-12.5 MG PO TABS: 1.0000 | ORAL_TABLET | Freq: Every day | ORAL | Status: DC

## 2012-12-06 NOTE — Telephone Encounter (Signed)
Patient advised.

## 2012-12-06 NOTE — Telephone Encounter (Signed)
patient request refill on Losartan-hctz 100-12.5mg  1 po qd [facility]

## 2012-12-06 NOTE — Telephone Encounter (Signed)
Rx refilled. Due for office visit before running out

## 2012-12-09 ENCOUNTER — Ambulatory Visit: Payer: 59

## 2012-12-09 ENCOUNTER — Ambulatory Visit (INDEPENDENT_AMBULATORY_CARE_PROVIDER_SITE_OTHER): Payer: 59 | Admitting: Emergency Medicine

## 2012-12-09 VITALS — BP 132/84 | HR 108 | Temp 98.3°F | Resp 18 | Ht 63.0 in | Wt 285.0 lb

## 2012-12-09 DIAGNOSIS — R05 Cough: Secondary | ICD-10-CM

## 2012-12-09 DIAGNOSIS — I10 Essential (primary) hypertension: Secondary | ICD-10-CM

## 2012-12-09 DIAGNOSIS — R5381 Other malaise: Secondary | ICD-10-CM

## 2012-12-09 LAB — POCT CBC
HCT, POC: 43.7 % (ref 37.7–47.9)
Hemoglobin: 13.8 g/dL (ref 12.2–16.2)
Lymph, poc: 4.6 — AB (ref 0.6–3.4)
MCH, POC: 24.9 pg — AB (ref 27–31.2)
MPV: 11.7 fL (ref 0–99.8)
POC MID %: 5.1 %M (ref 0–12)
RBC: 5.55 M/uL — AB (ref 4.04–5.48)
WBC: 13.3 10*3/uL — AB (ref 4.6–10.2)

## 2012-12-09 LAB — BASIC METABOLIC PANEL
CO2: 27 mEq/L (ref 19–32)
Chloride: 103 mEq/L (ref 96–112)
Creat: 0.73 mg/dL (ref 0.50–1.10)
Glucose, Bld: 141 mg/dL — ABNORMAL HIGH (ref 70–99)
Sodium: 138 mEq/L (ref 135–145)

## 2012-12-09 MED ORDER — LOSARTAN POTASSIUM-HCTZ 100-12.5 MG PO TABS: 1.0000 | ORAL_TABLET | Freq: Every day | ORAL | Status: DC

## 2012-12-09 MED ORDER — METHYLPREDNISOLONE ACETATE 80 MG/ML IJ SUSP
80.0000 mg | Freq: Once | INTRAMUSCULAR | Status: AC
  Administered 2012-12-09: 80 mg via INTRAMUSCULAR

## 2012-12-09 MED ORDER — PROMETHAZINE-DM 6.25-15 MG/5ML PO SYRP: 5.0000 mL | ORAL_SOLUTION | Freq: Four times a day (QID) | ORAL | Status: DC | PRN

## 2012-12-09 MED ORDER — ALBUTEROL SULFATE (2.5 MG/3ML) 0.083% IN NEBU
2.5000 mg | INHALATION_SOLUTION | Freq: Once | RESPIRATORY_TRACT | Status: AC
  Administered 2012-12-09: 2.5 mg via RESPIRATORY_TRACT

## 2012-12-09 NOTE — Progress Notes (Signed)
   [address] [place] [address]   Phone [phone]  Subjective:    Patient ID: [name], female    DOB: 1970-11-19, 42 y.o.   MRN: [id number]  HPI  Pt presents to clinic with 2 wk h/o cough.  She is worried that she has pneumonia but has just finished a course of Levaquin today.  Her cough is dry and seems to come from her throat, like a tickle.  When she gets hot or lays down it seems to get worse.  She is coughing so hard she gets SOB but is fine when she in not coughing.  She was up most of the night due to the cough.  She has no h/o asthma and is not wheezing to the best of her knowledge.  She has no other cold symptoms.  She does need labs for her HTN which is under good control.  Review of Systems  Constitutional: Negative for fever and chills.  HENT: Negative for congestion and sore throat.   Respiratory: Positive for cough. Negative for chest tightness, shortness of breath and wheezing.   Psychiatric/Behavioral: Positive for sleep disturbance (due to cough).       Objective:   Physical Exam  Vitals reviewed. Constitutional: She is oriented to person, place, and time. She appears well-developed and well-nourished.  HENT:  Head: Normocephalic and atraumatic.  Right Ear: Hearing, tympanic membrane, external ear and ear canal normal.  Left Ear: Hearing, tympanic membrane, external ear and ear canal normal.  Nose: Nose normal.  Mouth/Throat: Uvula is midline and oropharynx is clear and moist. No oropharyngeal exudate.  Eyes: Conjunctivae normal are normal.  Neck: Neck supple.  Cardiovascular: Normal rate, regular rhythm and normal heart sounds.   Pulmonary/Chest: Effort normal and breath sounds normal. She has no wheezes.       Significant dry sounding cough in room.  Lymphadenopathy:    She has no cervical adenopathy.  Neurological: She is alert and oriented to person, place, and time.  Skin: Skin is warm and dry.  Psychiatric: She has a normal mood and affect.  Her behavior is normal. Judgment and thought content normal.    [facility] reading (PRIMARY) by  Dr. Dareen Piano.  Normal CXR, no infiltrates.  I gave pt an albuterol neb which pt stated helped her breathing but did not seem to affect her cough.  Her lung exam did not change after the neb.      Assessment & Plan:   1. Other malaise and fatigue  POCT CBC  2. Cough  POCT CBC, DG Chest 2 View, albuterol (PROVENTIL) (2.5 MG/3ML) 0.083% nebulizer solution 2.5 mg, promethazine-dextromethorphan (PROMETHAZINE-DM) 6.25-15 MG/5ML syrup, methylPREDNISolone acetate (DEPO-MEDROL) injection 80 mg  3. HTN (hypertension)  Basic metabolic panel   I think pt has a post viral inflammatory cough - that is going to take some time to resolve.  We talked about prednisone but pt is hesitant to take oral prednisone due to SE so we will try an injection today of Depo-medrol and see if that helps.  If pt feels like the albuterol did help with the cough we can call in an albuterol inhaler.  We did labs for HTN today and we can refill the patient meds for 6 months total due to good control.  I am unsure why pt's WBCs have been high recently but think the last 2 are from reactive reasons.  We will monitor in the future when she feels completely well.

## 2012-12-10 NOTE — Progress Notes (Signed)
Reviewed and agree.

## 2012-12-18 ENCOUNTER — Ambulatory Visit: Payer: 59

## 2012-12-18 ENCOUNTER — Ambulatory Visit (INDEPENDENT_AMBULATORY_CARE_PROVIDER_SITE_OTHER): Payer: 59 | Admitting: Family Medicine

## 2012-12-18 ENCOUNTER — Encounter: Payer: Self-pay | Admitting: Family Medicine

## 2012-12-18 VITALS — BP 138/91 | HR 118 | Temp 98.6°F | Resp 18

## 2012-12-18 DIAGNOSIS — R05 Cough: Secondary | ICD-10-CM

## 2012-12-18 DIAGNOSIS — R059 Cough, unspecified: Secondary | ICD-10-CM

## 2012-12-18 LAB — POCT CBC
Granulocyte percent: 62.8 %G (ref 37–80)
HCT, POC: 43.5 % (ref 37.7–47.9)
Hemoglobin: 13.8 g/dL (ref 12.2–16.2)
Lymph, poc: 4.1 — AB (ref 0.6–3.4)
MCH, POC: 24.9 pg — AB (ref 27–31.2)
MCHC: 31.7 g/dL — AB (ref 31.8–35.4)
MCV: 78.3 fL — AB (ref 80–97)
MID (cbc): 0.8 (ref 0–0.9)
MPV: 13.6 fL (ref 0–99.8)
POC Granulocyte: 8.3 — AB (ref 2–6.9)
POC LYMPH PERCENT: 30.9 %L (ref 10–50)
POC MID %: 6.3 %M (ref 0–12)
Platelet Count, POC: 234 10*3/uL (ref 142–424)
RBC: 5.55 M/uL — AB (ref 4.04–5.48)
RDW, POC: 16.8 %
WBC: 13.2 10*3/uL — AB (ref 4.6–10.2)

## 2012-12-18 MED ORDER — HYDROCOD POLST-CHLORPHEN POLST 10-8 MG/5ML PO LQCR: 5.0000 mL | Freq: Two times a day (BID) | ORAL | Status: DC | PRN

## 2012-12-18 MED ORDER — MOXIFLOXACIN HCL 400 MG PO TABS: 400.0000 mg | ORAL_TABLET | Freq: Every day | ORAL | Status: DC

## 2012-12-18 MED ORDER — METHYLPREDNISOLONE ACETATE 80 MG/ML IJ SUSP
80.0000 mg | Freq: Once | INTRAMUSCULAR | Status: AC
  Administered 2012-12-18: 80 mg via INTRAMUSCULAR

## 2012-12-18 NOTE — Progress Notes (Signed)
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  Objective: violent cough in office to point of syncope HEENT:  Normal except for dullness both TM's Chest:  Bilateral exp wheezes and bibasilar rales [facility] reading (PRIMARY) by  Dr. Milus Glazier  CXR- hazy RLL Results for orders placed in visit on 12/18/12  POCT CBC      Component Value Range   WBC 13.2 (*) 4.6 - 10.2 K/uL   Lymph, poc 4.1 (*) 0.6 - 3.4   POC LYMPH PERCENT 30.9  10 - 50 %L   MID (cbc) 0.8  0 - 0.9   POC MID % 6.3  0 - 12 %M   POC Granulocyte 8.3 (*) 2 - 6.9   Granulocyte percent 62.8  37 - 80 %G   RBC 5.55 (*) 4.04 - 5.48 M/uL   Hemoglobin 13.8  12.2 - 16.2 g/dL   HCT, POC 16.1  09.6 - 47.9 %   MCV 78.3 (*) 80 - 97 fL   MCH, POC 24.9 (*) 27 - 31.2 pg   MCHC 31.7 (*) 31.8 - 35.4 g/dL   RDW, POC 04.5     Platelet Count, POC 234  142 - 424 K/uL   MPV 13.6  0 - 99.8 fL   .   Assessment: severe bronchitis with possible RLL pneumonia  Plan:   Depomedrol shot tussionex avelox

## 2012-12-29 ENCOUNTER — Ambulatory Visit (INDEPENDENT_AMBULATORY_CARE_PROVIDER_SITE_OTHER): Payer: 59 | Admitting: Family Medicine

## 2012-12-29 VITALS — BP 144/99 | HR 98 | Temp 98.3°F | Resp 18 | Ht 63.0 in | Wt 258.0 lb

## 2012-12-29 DIAGNOSIS — J9801 Acute bronchospasm: Secondary | ICD-10-CM

## 2012-12-29 DIAGNOSIS — J3489 Other specified disorders of nose and nasal sinuses: Secondary | ICD-10-CM

## 2012-12-29 DIAGNOSIS — R05 Cough: Secondary | ICD-10-CM

## 2012-12-29 DIAGNOSIS — R059 Cough, unspecified: Secondary | ICD-10-CM

## 2012-12-29 DIAGNOSIS — J209 Acute bronchitis, unspecified: Secondary | ICD-10-CM

## 2012-12-29 MED ORDER — HYDROCOD POLST-CHLORPHEN POLST 10-8 MG/5ML PO LQCR: 5.0000 mL | Freq: Two times a day (BID) | ORAL | Status: DC | PRN

## 2012-12-29 MED ORDER — BENZONATATE 200 MG PO CAPS: 200.0000 mg | ORAL_CAPSULE | Freq: Three times a day (TID) | ORAL | Status: DC | PRN

## 2012-12-29 MED ORDER — ALBUTEROL SULFATE HFA 108 (90 BASE) MCG/ACT IN AERS: 2.0000 | INHALATION_SPRAY | RESPIRATORY_TRACT | Status: DC | PRN

## 2012-12-29 MED ORDER — PREDNISONE 20 MG PO TABS: 40.0000 mg | ORAL_TABLET | Freq: Every day | ORAL | Status: DC

## 2012-12-29 MED ORDER — IPRATROPIUM BROMIDE 0.02 % IN SOLN
0.5000 mg | Freq: Once | RESPIRATORY_TRACT | Status: AC
  Administered 2012-12-29: 0.5 mg via RESPIRATORY_TRACT

## 2012-12-29 MED ORDER — ALBUTEROL SULFATE (2.5 MG/3ML) 0.083% IN NEBU
2.5000 mg | INHALATION_SOLUTION | Freq: Once | RESPIRATORY_TRACT | Status: AC
  Administered 2012-12-29: 2.5 mg via RESPIRATORY_TRACT

## 2012-12-29 MED ORDER — IPRATROPIUM BROMIDE 0.06 % NA SOLN: 2.0000 | Freq: Four times a day (QID) | NASAL | Status: DC

## 2012-12-29 NOTE — Patient Instructions (Signed)
atrovent nasal spray  As needed for congestion. Tessalon or cepacol for cough during the day, tussionex at night (only if not wheezing or short of breath - use albuterol every 4-6 hours for wheeze), start prednisone today. In 2 days - can take tussionex every 12 hours with complete rest for component of cyclical cough.  If you are not improving in the next 3-4 days - would consider pulmonary evaluation. Return to the clinic or go to the nearest emergency room if any of your symptoms worsen or new symptoms occur.

## 2012-12-29 NOTE — Progress Notes (Signed)
Subjective:    Patient ID: [name], female    DOB: 10-Sep-1971, 42 y.o.   MRN: [id number]  HPI [name] is a 42 y.o. female intial ov 11/29/12 - cough x 1 week.  Hx of tobacco use.  levaquin rx, but not taken for few days as GI illness next day with diarrhea. Took 7 days of Levaquin 500mg  qd for bronchitis. Helped some, but still coughing.  Did have depomedrol on the 17th. And albuterol inhaler - no relief.  12/09/12 - dx with postinflammatory cough - depomedrol injection. Min change with albuterol neb.   Seen 12/18/12 - with coughing fits and near syncopal sensation with cough.  Treated for possible pna with avelox 400mg  qd for 7 days, tussionex and another injection of depomedrol. CXR report  - NAD, WBC 13.2 on that ov. Felt like much improved after that ov. Less coughing. Only took tussionex at night. Was taking cepacol - helped a little. couging fits - back of throat.  Lightheaded with coughing fits. Only wheeze sensation with coughing fits. tussionex keeps from coughing at night. Clear phlegm and runny nose. No fevers recently. Occasional sweats at night. Had been off cigarettes, but smoked few cigarettes past few days - flared up cough.   Review of Systems  Constitutional: Negative for fever and chills.  HENT: Positive for congestion and postnasal drip.   Respiratory: Positive for cough and shortness of breath (during coughing fit only. ).   Neurological: Positive for light-headedness (with cough fits. ). Negative for syncope.       Objective:   Physical Exam  Vitals reviewed. Constitutional: She is oriented to person, place, and time. She appears well-developed and well-nourished. No distress.  HENT:  Head: Atraumatic. Macrocephalic.  Right Ear: Hearing, tympanic membrane, external ear and ear canal normal.  Left Ear: Hearing, tympanic membrane, external ear and ear canal normal.  Nose: Rhinorrhea present.  Mouth/Throat: Oropharynx is clear and moist. No oropharyngeal  exudate.  Eyes: Conjunctivae and EOM are normal. Pupils are equal, round, and reactive to light.  Neck: Trachea normal.  Cardiovascular: Normal rate, regular rhythm, normal heart sounds and intact distal pulses.   No murmur heard. Pulmonary/Chest: Effort normal. No stridor. No respiratory distress. She has wheezes (upper lobes.  coughing fits at time during ov. ). She has no rhonchi. She has no rales.  Neurological: She is alert and oriented to person, place, and time.  Skin: Skin is warm and dry. No rash noted.  Psychiatric: She has a normal mood and affect. Her behavior is normal.    Albuterol and atrovent neb x 1. Improved aeration, sx improved, few distant wheezes.   Results for orders placed in visit on 12/29/12  GLUCOSE, POCT (MANUAL RESULT ENTRY)      Result Value Range   POC Glucose 100 (*) 70 - 99 mg/dl        Assessment & Plan:  [name] is a 42 y.o. female Cough - Plan: albuterol (PROVENTIL) (2.5 MG/3ML) 0.083% nebulizer solution 2.5 mg, ipratropium (ATROVENT) nebulizer solution 0.5 mg, ipratropium (ATROVENT) 0.06 % nasal spray, benzonatate (TESSALON) 200 MG capsule, chlorpheniramine-HYDROcodone (TUSSIONEX PENNKINETIC ER) 10-8 MG/5ML LQCR, POCT glucose (manual entry)    Acute bronchitis with Bronchospasm - Plan: predniSONE (DELTASONE) 20 MG tablet, albuterol (PROVENTIL HFA;VENTOLIN HFA) 108 (90 BASE) MCG/ACT inhaler.  Suspect underlying reactive airway/bronchospasm with bronchitis, and now with irritative/early cyclical cough. Status post levaquin and avelox, and most recent CXR WNL. Will hold on further abx at  present.  Prednisone as above, albuterol hfa as needed, and tussionex if not dyspneic.   Consider pulm eval if not improving into this week.  Rhinorrhea - Plan: ipratropium (ATROVENT) 0.06 % nasal spray  Patient Instructions  atrovent nasal spray  As needed for congestion. Tessalon or cepacol for cough during the day, tussionex at night (only if not wheezing or  short of breath - use albuterol every 4-6 hours for wheeze), start prednisone today. In 2 days - can take tussionex every 12 hours with complete rest for component of cyclical cough.  If you are not improving in the next 3-4 days - would consider pulmonary evaluation. Return to the clinic or go to the nearest emergency room if any of your symptoms worsen or new symptoms occur.

## 2013-01-09 ENCOUNTER — Telehealth: Payer: Self-pay

## 2013-01-09 MED ORDER — ALPRAZOLAM 0.5 MG PO TABS: ORAL_TABLET | ORAL | Status: DC

## 2013-01-09 NOTE — Telephone Encounter (Signed)
Yes. Increase to one and one half tablets daily. # 45 with 2 refills

## 2013-01-09 NOTE — Telephone Encounter (Signed)
Dr Cleta Alberts, as pt D/W you in person, she is facing some situational stress and you have instr'd her that she may take some add'l alprazolam 0.5 as needed. Currently her Rx is only for sig: take 1 tab at bedtime w/ #30. She will not have enough to take the extra as instr'd and is due for a RF. She is afraid that if she gets it RFd w/current Rx, she will run out too quickly and then will not be able to RF again sooner than a month. Do you want to send in a different Rx instead w/new instr's and higher count/month?

## 2013-01-09 NOTE — Telephone Encounter (Signed)
Called in new Rx to [facility] pharmacy and notified pt.

## 2013-01-12 ENCOUNTER — Ambulatory Visit (INDEPENDENT_AMBULATORY_CARE_PROVIDER_SITE_OTHER): Payer: 59 | Admitting: Internal Medicine

## 2013-01-12 ENCOUNTER — Encounter: Payer: Self-pay | Admitting: Internal Medicine

## 2013-01-12 VITALS — BP 143/93 | HR 97 | Temp 98.1°F | Resp 18 | Ht 63.0 in | Wt 289.0 lb

## 2013-01-12 DIAGNOSIS — R05 Cough: Secondary | ICD-10-CM

## 2013-01-12 DIAGNOSIS — F17200 Nicotine dependence, unspecified, uncomplicated: Secondary | ICD-10-CM

## 2013-01-12 MED ORDER — HYDROCOD POLST-CHLORPHEN POLST 10-8 MG/5ML PO LQCR: 5.0000 mL | Freq: Two times a day (BID) | ORAL | Status: DC | PRN

## 2013-01-12 MED ORDER — METHYLPREDNISOLONE ACETATE 80 MG/ML IJ SUSP
80.0000 mg | Freq: Once | INTRAMUSCULAR | Status: AC
  Administered 2013-01-12: 80 mg via INTRAMUSCULAR

## 2013-01-12 NOTE — Progress Notes (Signed)
  Subjective:    Patient ID: BELLATRIX DEVONSHIRE, female    DOB: 1971/10/29, 42 y.o.   MRN: [id number]  HPI Has chronic cough, thorough w/up with multiple cxrs. No fever, weight loss, anorexia, hemoptysis. Continues smoking and must quit.This makes her anxious. She is mildly allergic but no hx of asthma.   Review of Systems     Objective:   Physical Exam  Vitals reviewed. Constitutional: She is oriented to person, place, and time. She appears well-nourished. No distress.  HENT:  Nose: Nose normal.  Neck: Normal range of motion. No thyromegaly present.  Cardiovascular: Normal rate, regular rhythm and normal heart sounds.   Pulmonary/Chest: Effort normal. No respiratory distress. She has no wheezes.  Lymphadenopathy:    She has no cervical adenopathy.  Neurological: She is alert and oriented to person, place, and time.  Skin: Skin is warm and dry.  Psychiatric: She has a normal mood and affect.   Appears well       Assessment & Plan:  Could be chronic cyclical cough Refer to pulmonary if persists Depomedrol/Tussionex/Quiet and avoid cough and clearing of throat Quit smoking counseling and plan

## 2013-01-22 ENCOUNTER — Ambulatory Visit (INDEPENDENT_AMBULATORY_CARE_PROVIDER_SITE_OTHER): Payer: 59 | Admitting: Internal Medicine

## 2013-01-22 VITALS — BP 137/94 | HR 88 | Temp 98.0°F | Resp 18 | Ht 63.5 in | Wt 283.0 lb

## 2013-01-22 DIAGNOSIS — R05 Cough: Secondary | ICD-10-CM

## 2013-01-22 LAB — POCT CBC
Granulocyte percent: 69.5 %G (ref 37–80)
HCT, POC: 42.9 % (ref 37.7–47.9)
MID (cbc): 0.7 (ref 0–0.9)
POC Granulocyte: 7.9 — AB (ref 2–6.9)
POC LYMPH PERCENT: 24.5 %L (ref 10–50)
Platelet Count, POC: 256 10*3/uL (ref 142–424)
RBC: 5.45 M/uL (ref 4.04–5.48)
RDW, POC: 17 %

## 2013-01-22 MED ORDER — OMEPRAZOLE 40 MG PO CPDR
40.0000 mg | DELAYED_RELEASE_CAPSULE | Freq: Every day | ORAL | Status: DC
Start: 2013-01-22 — End: 2013-04-15

## 2013-01-22 MED ORDER — LIDOCAINE VISCOUS 2 % MT SOLN: OROMUCOSAL | Status: DC

## 2013-01-22 MED ORDER — AZITHROMYCIN 500 MG PO TABS: 500.0000 mg | ORAL_TABLET | Freq: Every day | ORAL | Status: DC

## 2013-01-22 MED ORDER — BECLOMETHASONE DIPROPIONATE 80 MCG/ACT IN AERS: 1.0000 | INHALATION_SPRAY | Freq: Two times a day (BID) | RESPIRATORY_TRACT | Status: DC

## 2013-01-22 NOTE — Progress Notes (Signed)
  Subjective:    Patient ID: [name], female    DOB: 02-23-1971, 42 y.o.   MRN: [id number]  HPI see history of prolonged cough with leukocytosis and with little response to various treatments Nebulization and inhaled beta agonist make little difference Steroids have provided short-term relief Antibiotics including Avelox and Levaquin without sustained improvement She continues to have paroxysms of cough exacerbated by laughing or increasing respiratory rate and leading to gagging and occasional vomiting Occasional hoarseness No fever chills or night sweats Occasional TUMS for dyspepsia but no clear reflux No history of asthma     Review of Systems No fever chills or night sweats No edema No palpitations or chest pain    Objective:   Physical Exam Vital signs stable Pupils equal round reactive to light and accommodation/conjunctiva clear Nares clear/throat clear except tonsils 2-3+//no signs of fungal infection No nodes or thyromegaly No stridor/no rhonchi Lungs clear to auscultation Heart regular without murmur Forced exhalation precipitates coughing paroxysms      Results for orders placed in visit on 01/22/13  POCT CBC      Result Value Range   WBC 11.3 (*) 4.6 - 10.2 K/uL   Lymph, poc 2.8  0.6 - 3.4   POC LYMPH PERCENT 24.5  10 - 50 %L   MID (cbc) 0.7  0 - 0.9   POC MID % 6.0  0 - 12 %M   POC Granulocyte 7.9 (*) 2 - 6.9   Granulocyte percent 69.5  37 - 80 %G   RBC 5.45  4.04 - 5.48 M/uL   Hemoglobin 13.5  12.2 - 16.2 g/dL   HCT, POC 40.9  81.1 - 47.9 %   MCV 78.7 (*) 80 - 97 fL   MCH, POC 24.8 (*) 27 - 31.2 pg   MCHC 31.5 (*) 31.8 - 35.4 g/dL   RDW, POC 91.4     Platelet Count, POC 256  142 - 424 K/uL   MPV 15.0  0 - 99.8 fL   4 cc of lidocaine to gargle and swallow given 30 minutes later able to force expiration without cough  Assessment & Plan:  Problem 1 prolonged cough-postinfection/inflammatory/probably supraglottic  Continue when necessary  lidocaine Use omeprazole 40 mg for one month to block any laryngo- pharyngeal reflux Inhaled steroids with Qvar Broader atypical coverage with Zithromax  Recheck 2 weeks  Meds ordered this encounter  Medications  . lidocaine (XYLOCAINE) 2 % solution    Sig: 1 tsp as needed for cough    Dispense:  100 mL    Refill:  0  . azithromycin (ZITHROMAX) 500 MG tablet    Sig: Take 1 tablet (500 mg total) by mouth daily.    Dispense:  5 tablet    Refill:  0  . omeprazole (PRILOSEC) 40 MG capsule    Sig: Take 1 capsule (40 mg total) by mouth daily.    Dispense:  30 capsule    Refill:  0    May substitute 40mg  protonix if less expensive  . beclomethasone (QVAR) 80 MCG/ACT inhaler    Sig: Inhale 1 puff into the lungs 2 (two) times daily.    Dispense:  1 Inhaler    Refill:  12    May substitute any less expensive steroid inhaler/we are treating post infection inflammatory upper airway cough    Recheck 4 hours later and still no cough after lidocaine

## 2013-02-07 ENCOUNTER — Other Ambulatory Visit: Payer: Self-pay | Admitting: Radiology

## 2013-02-07 DIAGNOSIS — R05 Cough: Secondary | ICD-10-CM

## 2013-02-22 ENCOUNTER — Ambulatory Visit (INDEPENDENT_AMBULATORY_CARE_PROVIDER_SITE_OTHER): Payer: 59 | Admitting: Emergency Medicine

## 2013-02-22 VITALS — BP 152/108 | HR 97 | Temp 98.4°F | Resp 18

## 2013-02-22 DIAGNOSIS — F439 Reaction to severe stress, unspecified: Secondary | ICD-10-CM

## 2013-02-22 DIAGNOSIS — R51 Headache: Secondary | ICD-10-CM

## 2013-02-22 DIAGNOSIS — G4700 Insomnia, unspecified: Secondary | ICD-10-CM

## 2013-02-22 DIAGNOSIS — Z733 Stress, not elsewhere classified: Secondary | ICD-10-CM

## 2013-02-22 MED ORDER — KETOROLAC TROMETHAMINE 60 MG/2ML IM SOLN
60.0000 mg | Freq: Once | INTRAMUSCULAR | Status: AC
  Administered 2013-02-22: 60 mg via INTRAMUSCULAR

## 2013-02-22 NOTE — Progress Notes (Signed)
  Subjective:    Patient ID: [name], female    DOB: Feb 17, 1971, 42 y.o.   MRN: [id number]  HPI patient presents with a severe bifrontal headache. Patient is under significant amount of stress in that her son's girlfriend was killed recently a gunshot wound to have him killed. This has been incredibly stressful for her. She is considering moving her son in [place] so he can be away from the situation    Review of Systems     Objective:   Physical Exam patient is alert and cooperative. She is tearful cranial nerves are done no focal neurological signs. She has a persistent cough which has been going on for months now.        Assessment & Plan:  Patient given note to be out of work today. I told her I could complete her FMLA forms. She will be given 60 of Toradol IM for her headache relief. She is going to talk with the police officer in charge of her sounds case and see if he can go ahead and go to [place] M[place] end of the observation of the different parole officer there appeared I agreed with her it was unsafe for him to stay in her home and her son needs to be moved up to [place] to be out of [place].

## 2013-03-07 ENCOUNTER — Ambulatory Visit (INDEPENDENT_AMBULATORY_CARE_PROVIDER_SITE_OTHER): Payer: 59 | Admitting: Emergency Medicine

## 2013-03-07 VITALS — BP 122/90 | HR 100 | Temp 98.7°F | Resp 20

## 2013-03-07 DIAGNOSIS — F411 Generalized anxiety disorder: Secondary | ICD-10-CM

## 2013-03-07 DIAGNOSIS — R079 Chest pain, unspecified: Secondary | ICD-10-CM

## 2013-03-07 NOTE — Progress Notes (Signed)
  Subjective:    Patient ID: [name], female    DOB: 1971/04/14, 42 y.o.   MRN: [id number]  HPI patient under a lot of stress recently and had an episode where she fell chest discomfort associated with tachycardia palpitations and felt as though she couldn't take a breath in. At that time she was thinking about her son who had to move away due to some problems in [place]. She's been worried about him and said that he is not here. Patient is over 38 she does smoke a pack per day. She does have sickle cell trait. She has no history of diabetes high cholesterol or family history of heart disease. She is hypertensive. On medication. At the time I'm seeing her now she states she feels fine her episode is over and she feels like she can breathe without difficulty.    Review of Systems     Objective:   Physical Exam HEENT exam is unremarkable. Her neck is supple. Her chest exam is clear to auscultation and percussion. Heart regular rate no murmurs rubs or gallops appreciated. Abdomen is soft nontender  EKG shows a normal sinus rhythm there is maybe a millimeter elevation in V1 however V2 345 and 6 are completely normal. Her EKG is essentially unchanged from previous and computer reading is normal .      Assessment & Plan:  Patient slowed her breathing down and her symptoms resolved. She is now feeling better at feels this was a panic attack over worry about her son. Patient to be seen by cardiologist as soon as we can make appointment. I would still recommend she undergo stress testing because she does have high blood pressure she smokes and is over 38.

## 2013-03-07 NOTE — Patient Instructions (Addendum)
Appt with Dr Jacinto Halim tomorrow (03/08/13) am 9:45 for evaluation they are located at [address] [facility] [address]

## 2013-03-13 ENCOUNTER — Telehealth: Payer: Self-pay | Admitting: Radiology

## 2013-03-13 DIAGNOSIS — R05 Cough: Secondary | ICD-10-CM

## 2013-03-13 NOTE — Telephone Encounter (Signed)
Referral made 

## 2013-03-13 NOTE — Telephone Encounter (Signed)
Patient wants to know if she can see pulmonologist, please advise.

## 2013-03-13 NOTE — Telephone Encounter (Signed)
PS please make referral to one of the [facility] pulmonary specialist for persistent cough. Please be sure she also sees the cardiologist. Also send copies of all of her office records as well as her ENT referral.

## 2013-03-14 ENCOUNTER — Ambulatory Visit: Payer: 59

## 2013-03-14 ENCOUNTER — Ambulatory Visit (INDEPENDENT_AMBULATORY_CARE_PROVIDER_SITE_OTHER): Payer: 59 | Admitting: Family Medicine

## 2013-03-14 VITALS — BP 127/84 | HR 103 | Temp 98.4°F | Resp 16 | Ht 63.5 in | Wt 250.0 lb

## 2013-03-14 DIAGNOSIS — M545 Low back pain: Secondary | ICD-10-CM

## 2013-03-14 DIAGNOSIS — M25562 Pain in left knee: Secondary | ICD-10-CM

## 2013-03-14 DIAGNOSIS — M25579 Pain in unspecified ankle and joints of unspecified foot: Secondary | ICD-10-CM

## 2013-03-14 DIAGNOSIS — S80212A Abrasion, left knee, initial encounter: Secondary | ICD-10-CM

## 2013-03-14 DIAGNOSIS — M25569 Pain in unspecified knee: Secondary | ICD-10-CM

## 2013-03-14 DIAGNOSIS — S8000XA Contusion of unspecified knee, initial encounter: Secondary | ICD-10-CM

## 2013-03-14 MED ORDER — ACETAMINOPHEN 325 MG PO TABS
1000.0000 mg | ORAL_TABLET | Freq: Once | ORAL | Status: AC
  Administered 2013-03-14: 1000 mg via ORAL

## 2013-03-14 NOTE — Progress Notes (Signed)
Subjective:    Patient ID: [name], female    DOB: 16-Aug-1971, 42 y.o.   MRN: [id number]  HPI [name] is a 42 y.o. female  Last nt - 11pm. Walking down steps at cousin's house, - misstep on last 3 steps, fell forward on R outstretched hand, and then landed on knees., feet turned in.  Noticed pain in both ankles, not swelling, or bruising in ankles. No hand/wrist pain.  Both knees hurt today, L knee hurts more than right. Hx of L knee problems in past requiring injection last year.  both ankles hurt and large toe hurts on L foot. And sore in R low back only this morning (not last night).  Sore all over, but able to walk/ weight bear. No bowel or bladder incontinence, no saddle anesthesia, no lower extremity weakness.   Tx: ibuprofen this morning as working today. No ice/heat.   Review of Systems  Genitourinary: Negative for difficulty urinating.  Musculoskeletal: Positive for back pain.  Skin: Positive for wound (abrasion on top of L knee. ).       Objective:   Physical Exam  Vitals reviewed. Constitutional: She is oriented to person, place, and time. She appears well-developed and well-nourished.  Pulmonary/Chest: Effort normal.  Musculoskeletal:       Right knee: She exhibits normal range of motion, no swelling, no effusion, no ecchymosis, no deformity and no bony tenderness. No tenderness found. No medial joint line, no lateral joint line and no patellar tendon tenderness noted.       Left knee: She exhibits normal range of motion, no swelling, no effusion and no ecchymosis. Tenderness (over patellar area in area of abrasion. ) found. No medial joint line, no lateral joint line, no MCL, no LCL and no patellar tendon tenderness noted.       Right ankle: She exhibits swelling (min med/lat. ). She exhibits normal range of motion (nl rom, but sore with rom in front of ankle) and no ecchymosis. No tenderness. No lateral malleolus, no medial malleolus, no AITFL, no posterior  TFL, no head of 5th metatarsal and no proximal fibula tenderness found. Achilles tendon normal.       Left ankle: She exhibits swelling (min med/lat.). She exhibits normal range of motion (min soreness with rom. ) and no ecchymosis. No tenderness. No lateral malleolus, no medial malleolus, no AITFL, no posterior TFL, no head of 5th metatarsal and no proximal fibula tenderness found. Achilles tendon normal.       Lumbar back: She exhibits tenderness. She exhibits normal range of motion, no bony tenderness and no deformity.       Back:       Legs: Neurological: She is alert and oriented to person, place, and time.  Skin: Skin is warm and dry.  Psychiatric: She has a normal mood and affect. Her behavior is normal.  [facility] reading (PRIMARY) by  Dr. Neva Seat: R ankle:negative for fx  L ankle:negative for fx L knee:negative for fx.    Tylenol given in office     Assessment & Plan:  [name] is a 42 y.o. female Knee pain, acute, left - Plan: DG Knee 1-2 Views Left, acetaminophen (TYLENOL) tablet 975 mg  Pain in joint, ankle and foot, unspecified laterality - Plan: DG Ankle Complete Left, DG Ankle Complete Right, acetaminophen (TYLENOL) tablet 975 mg  Contusion, knee, unspecified laterality, initial encounter - Plan: acetaminophen (TYLENOL) tablet 975 mg  Abrasion, knee, left, initial encounter  Low back  pain - Plan: acetaminophen (TYLENOL) tablet 975 mg   L knee pain/contusion with superficial abrasion. Sx care with otc ibuprofen up to 800mg  Q8h prn with food.  soap/water, topical polysporin, avoid direct pressure, rtc precautions.   R knee contusion, sx care. No bony ttp.   R and L ankle pain without apparent bony ttp.  Mild sprain possible.  Brace as needed, seated work as able if possible today.   R low back pain/strain - delayed soreness -  noted pain this am, deferred xr today. Sx care as above, seated work as able today.   Recheck if not improving next few days, sooner if  worse.   Plans on following up with cardiology and pulmonary - see prior ov's   Patient Instructions  Ibuprofen up to 800mg  every 8 hours with food.  Seated work as able today, elevate legs tonight when seated, but range of motion of knees, back, and ankles as tolerated.  Ice to ankle and knee up to 20 minutes at a time tonight.  Soap and water cleansing 1-2 times per day of knee abrasion, polysporin ointment and bandage. Recheck next few days if not improving. Return to the clinic or go to the nearest emergency room if any of your symptoms worsen or new symptoms occur.

## 2013-03-14 NOTE — Patient Instructions (Addendum)
Ibuprofen up to 800mg  every 8 hours with food.  Seated work as able today, elevate legs tonight when seated, but range of motion of knees, back, and ankles as tolerated.  Ice to ankle and knee up to 20 minutes at a time tonight.  Soap and water cleansing 1-2 times per day of knee abrasion, polysporin ointment and bandage. Recheck next few days if not improving. Return to the clinic or go to the nearest emergency room if any of your symptoms worsen or new symptoms occur.

## 2013-03-14 NOTE — Telephone Encounter (Signed)
Patient will see the pulmonologist, is agreeable to Cardiac appt also, but has not rescheduled the cardiac appt yet. She states she will do this, she is nervous about treadmill test.

## 2013-03-19 ENCOUNTER — Encounter: Payer: Self-pay | Admitting: Internal Medicine

## 2013-03-19 ENCOUNTER — Ambulatory Visit (INDEPENDENT_AMBULATORY_CARE_PROVIDER_SITE_OTHER): Payer: 59 | Admitting: Internal Medicine

## 2013-03-19 VITALS — BP 140/110 | HR 108 | Temp 98.1°F | Ht 63.0 in | Wt 301.4 lb

## 2013-03-19 DIAGNOSIS — I10 Essential (primary) hypertension: Secondary | ICD-10-CM

## 2013-03-19 DIAGNOSIS — R05 Cough: Secondary | ICD-10-CM

## 2013-03-19 MED ORDER — TELMISARTAN-HCTZ 80-12.5 MG PO TABS: ORAL_TABLET | ORAL | Status: DC

## 2013-03-19 MED ORDER — TRAMADOL HCL 50 MG PO TABS: ORAL_TABLET | ORAL | Status: DC

## 2013-03-19 NOTE — Progress Notes (Signed)
Subjective:    Patient ID: [name], female    DOB: Jan 11, 1971  MRN: [id number]  HPI  9 yobf smoker never had resp problems until Dec 2013 with onset of persistent daily cough referred 03/19/2013 by Dr Cleta Alberts for pulmonary eval.   03/19/2013 1st pulmonary eval still smoking  Cc acute onset during uri Dec 2013 but now persistent daily minimally prod cough which evolves into severe coughing paroxyms more day than night and worse with activity or taking a breath.  Only sob with cough.  Pos for urinary incont and gagging. No better with prednisone, uses halls menthol. qvar no better, prilosec no better. ent eval neg.  No obvious daytime variabilty or assoc chronic cough or cp or chest tightness, subjective wheeze overt sinus or hb symptoms. No unusual exp hx or h/o childhood pna/ asthma or premature birth to her knowledge.         Kouffman Reflux v Neurogenic Cough Differentiator Reflux Comments  Do you awaken from a sound sleep coughing violently?                            With trouble breathing? no   Do you have choking episodes when you cannot  Get enough air, gasping for air ?              Yes   Do you usually cough when you lie down into  The bed, or when you just lie down to rest ?                          no   Do you usually cough after meals or eating?         no   Do you cough when (or after) you bend over?    no   GERD SCORE     Kouffman Reflux v Neurogenic Cough Differentiator Neurogenic   Do you more-or-less cough all day long? no   Does change of temperature make you cough? no   Does laughing or chuckling cause you to cough? yes   Do fumes (perfume, automobile fumes, burned  Toast, etc.,) cause you to cough ?      yes   Does speaking, singing, or talking on the phone cause you to cough   ?               yes   Neurogenic/Airway score       Review of Systems  Constitutional: Negative for fever and unexpected weight change.  HENT: Negative for ear pain, nosebleeds,  congestion, sore throat, rhinorrhea, sneezing, trouble swallowing, dental problem, postnasal drip and sinus pressure.   Eyes: Negative for redness and itching.  Respiratory: Positive for cough, shortness of breath and wheezing. Negative for chest tightness.        Gets light headed and dizzy after coughing spell.   Cardiovascular: Negative for palpitations and leg swelling.  Gastrointestinal: Negative for nausea and vomiting.  Genitourinary: Negative for dysuria.  Musculoskeletal: Negative for joint swelling.  Skin: Negative for rash.  Neurological: Negative for headaches.  Hematological: Does not bruise/bleed easily.  Psychiatric/Behavioral: Positive for dysphoric mood. The patient is nervous/anxious.        Objective:   Physical Exam  amb bf nad Wt Readings from Last 3 Encounters:  03/19/13 301 lb 6.4 oz (136.714 kg)  03/14/13 250 lb (113.399 kg)  01/22/13 283 lb (128.368 kg)  HEENT: nl dentition, turbinates, and orophanx. Nl external ear canals without cough reflex   NECK :  without JVD/Nodes/TM/ nl carotid upstrokes bilaterally   LUNGS: no acc muscle use, clear to A and P bilaterally without cough on insp or exp maneuvers   CV:  RRR  no s3 or murmur or increase in P2, no edema   ABD:  soft and nontender with nl excursion in the supine position. No bruits or organomegaly, bowel sounds nl  MS:  warm without deformities, calf tenderness, cyanosis or clubbing  SKIN: warm and dry without lesions    NEURO:  alert, approp, no deficits   cxr 12/18/12 No active disease of the chest.      Assessment & Plan:

## 2013-03-19 NOTE — Patient Instructions (Addendum)
The key to effective treatment for your cough is eliminating the non-stop cycle of cough you're stuck in long enough to let your airway heal completely and then see if there is anything still making you cough once you stop the cough suppression, but this should take no more than 5 days to figuure out  First take delsym two tsp every 12 hours and supplement if needed with  tramadol 50 mg up to 2 every 4 hours to suppress the urge to cough. Swallowing water or using ice chips/non mint and menthol containing candies (such as lifesavers or sugarless jolly ranchers) are also effective.  You should rest your voice and avoid activities that you know make you cough.  Once you have eliminated the cough for 3 straight days try reducing the tramadol first,  then the delsym as tolerated.    Try prilosec 20mg   Take 30-60 min before first meal of the day and Pepcid 20 mg one bedtime until cough is completely gone for at least a week without the need for cough suppression  I think of reflux for chronic cough like I do oxygen for fire (doesn't cause the fire but once you get the oxygen suppressed it usually goes away regardless of the exact cause).  GERD (REFLUX)  is an extremely common cause of respiratory symptoms, many times with no significant heartburn at all.    It can be treated with medication, but also with lifestyle changes including avoidance of late meals, excessive alcohol, smoking cessation, and avoid fatty foods, chocolate, peppermint, colas, red wine, and acidic juices such as orange juice.  NO MINT OR MENTHOL PRODUCTS SO NO COUGH DROPS  USE SUGARLESS CANDY INSTEAD (jolley ranchers or Stover's)  NO OIL BASED VITAMINS - use powdered substitutes.

## 2013-03-20 NOTE — Assessment & Plan Note (Signed)
Although not obviously an ace, losartan is the one arb assoc anecdotally to cough and is now working well anyway so rec change to micardis 80/12.5 daily

## 2013-03-20 NOTE — Assessment & Plan Note (Signed)
The most common causes of chronic cough in immunocompetent adults include the following: upper airway cough syndrome (UACS), previously referred to as postnasal drip syndrome (PNDS), which is caused by variety of rhinosinus conditions; (2) asthma; (3) GERD; (4) chronic bronchitis from cigarette smoking or other inhaled environmental irritants; (5) nonasthmatic eosinophilic bronchitis; and (6) bronchiectasis.   These conditions, singly or in combination, have accounted for up to 94% of the causes of chronic cough in prospective studies.   Other conditions have constituted no >6% of the causes in prospective studies These have included bronchogenic carcinoma, chronic interstitial pneumonia, sarcoidosis, left ventricular failure, ACEI-induced cough, and aspiration from a condition associated with pharyngeal dysfunction.   .Chronic cough is often simultaneously caused by more than one condition. A single cause has been found from 38 to 82% of the time, multiple causes from 18 to 62%. Multiply caused cough has been the result of three diseases up to 42% of the time.    Of the three most common causes of chronic cough, only one (GERD)  can actually cause the other two (asthma and post nasal drip syndrome)  and perpetuate the cylce of cough inducing airway trauma, inflammation, heightened sensitivity to reflux which is prompted by the cough itself via a cyclical mechanism.    This may partially respond to steroids and look like asthma and post nasal drainage but never erradicated completely unless the cough and the secondary reflux are eliminated, preferably both at the same time.  While not intuitively obvious, many patients with chronic low grade reflux do not cough until there is a secondary insult that disturbs the protective epithelial barrier and exposes sensitive nerve endings.  This can be viral or direct physical injury such as with an endotracheal tube.   The point is that once this occurs, it is  difficult to eliminate using anything but a maximally effective acid suppression regimen at least in the short run, accompanied by an appropriate diet to address non acid GERD.    See instructions for specific recommendations which were reviewed directly with the patient who was given a copy with highlighter outlining the key components.  

## 2013-04-14 ENCOUNTER — Telehealth: Payer: Self-pay

## 2013-04-14 DIAGNOSIS — F419 Anxiety disorder, unspecified: Secondary | ICD-10-CM

## 2013-04-14 MED ORDER — TELMISARTAN-HCTZ 80-12.5 MG PO TABS: ORAL_TABLET | ORAL | Status: DC

## 2013-04-14 MED ORDER — ZOLPIDEM TARTRATE ER 6.25 MG PO TBCR: EXTENDED_RELEASE_TABLET | ORAL | Status: DC

## 2013-04-14 NOTE — Telephone Encounter (Signed)
Pt needs a RF of her Ambien and her Micardis 80/12.5 sent to [facility] please. Thanks

## 2013-04-14 NOTE — Telephone Encounter (Signed)
Micardis sent electronically.  Please PHONE in Ambien per order:  Meds ordered this encounter  Medications  . zolpidem (AMBIEN CR) 6.25 MG CR tablet    Sig: Take one at bedtime    Dispense:  30 tablet    Refill:  0    Order Specific Question:  Supervising Provider    Answer:  DOOLITTLE, ROBERT P [[id number]]  . telmisartan-hydrochlorothiazide (MICARDIS HCT) 80-12.5 MG per tablet    Sig: One daily    Dispense:  90 tablet    Refill:  1    Order Specific Question:  Supervising Provider    Answer:  DOOLITTLE, ROBERT P [[id number]]

## 2013-04-15 ENCOUNTER — Ambulatory Visit (INDEPENDENT_AMBULATORY_CARE_PROVIDER_SITE_OTHER): Payer: 59 | Admitting: Physician Assistant

## 2013-04-15 VITALS — BP 132/90 | HR 98 | Temp 98.2°F | Resp 16 | Ht 64.0 in | Wt 303.0 lb

## 2013-04-15 DIAGNOSIS — B9689 Other specified bacterial agents as the cause of diseases classified elsewhere: Secondary | ICD-10-CM

## 2013-04-15 DIAGNOSIS — B373 Candidiasis of vulva and vagina: Secondary | ICD-10-CM

## 2013-04-15 DIAGNOSIS — N760 Acute vaginitis: Secondary | ICD-10-CM

## 2013-04-15 DIAGNOSIS — N898 Other specified noninflammatory disorders of vagina: Secondary | ICD-10-CM

## 2013-04-15 DIAGNOSIS — B3731 Acute candidiasis of vulva and vagina: Secondary | ICD-10-CM

## 2013-04-15 LAB — POCT WET PREP WITH KOH
KOH Prep POC: NEGATIVE
RBC Wet Prep HPF POC: NEGATIVE

## 2013-04-15 MED ORDER — FLUCONAZOLE 150 MG PO TABS: 150.0000 mg | ORAL_TABLET | Freq: Once | ORAL | Status: DC

## 2013-04-15 MED ORDER — METRONIDAZOLE 500 MG PO TABS: 500.0000 mg | ORAL_TABLET | Freq: Two times a day (BID) | ORAL | Status: DC

## 2013-04-15 NOTE — Telephone Encounter (Signed)
Pt notified and rx's called into pharmacy

## 2013-04-15 NOTE — Progress Notes (Signed)
  Subjective:    Patient ID: [name], female    DOB: 1971/10/07, 42 y.o.   MRN: [id number]  HPI   [name] is a very pleasant 42 yr old female here with concern for vaginal odor.  Has noted increased vaginal discharge and odor over the last two weeks, worse within the last 5 days.  Has had BV in the past, but it's been awhile.  Has also had yeast inf in the past.  Denies dysuria, itching, irritation.  Last intercourse 2 wks ago, did use condoms.  S/p tubal ligation.  Denies concern for STI, lasted over 1 yr ago, declines testing today.  LMP 04/02/13  Review of Systems  Constitutional: Negative for fever and chills.  HENT: Negative.   Respiratory: Negative.   Cardiovascular: Negative.   Gastrointestinal: Negative.   Genitourinary: Positive for vaginal discharge. Negative for dysuria, urgency, frequency, hematuria, flank pain, vaginal bleeding, genital sores, menstrual problem, pelvic pain and dyspareunia.  Musculoskeletal: Negative.   Skin: Negative.   Neurological: Negative.        Objective:   Physical Exam  Vitals reviewed. Constitutional: She is oriented to person, place, and time. She appears well-developed and well-nourished. No distress.  HENT:  Head: Normocephalic and atraumatic.  Eyes: Conjunctivae are normal. No scleral icterus.  Cardiovascular: Normal rate, regular rhythm and normal heart sounds.   Pulmonary/Chest: Effort normal and breath sounds normal.  Abdominal: Soft. There is no tenderness.  Neurological: She is alert and oriented to person, place, and time.  Skin: Skin is warm and dry.  Psychiatric: She has a normal mood and affect. Her behavior is normal.   Self wet prep  Results for orders placed in visit on 04/15/13  POCT WET PREP WITH KOH      Result Value Range   Trichomonas, UA Negative     Clue Cells Wet Prep HPF POC tntc     Epithelial Wet Prep HPF POC neg     Yeast Wet Prep HPF POC neg     Bacteria Wet Prep HPF POC 5+     RBC Wet Prep HPF POC  neg     WBC Wet Prep HPF POC neg     KOH Prep POC Negative          Assessment & Plan:  Bacterial vaginosis - Plan: metroNIDAZOLE (FLAGYL) 500 MG tablet  Vaginal discharge - Plan: POCT Wet Prep with KOH  Vaginal candidiasis - Plan: fluconazole (DIFLUCAN) 150 MG tablet   [name] is a very pleasant 42 yr old female with bacterial vaginosis.  Self wet prep reveals tntc clues.  Will start metronidazole BID x 7 days.  Discussed avoidance of etoh.  Pt states she has a history of developing yeast infections with abx.  Flunconazole x 1 if develops symptoms, may repeat in one week.  Discussed possible triggers of BV.  If worsening or not improving, pt will RTC.

## 2013-04-15 NOTE — Patient Instructions (Addendum)
Begin taking metronidazole (Flagyl) twice daily for 7 days.  Be sure to finish the full course.  DO NOT CONSUME ALCOHOL WITH THIS MEDICATION or 48 hours after your last dose.  Take the fluconazole (Diflucan) if you develop a yeast infection - take once and repeat in 1 week if needed.   Bacterial Vaginosis Bacterial vaginosis (BV) is a vaginal infection where the normal balance of bacteria in the vagina is disrupted. The normal balance is then replaced by an overgrowth of certain bacteria. There are several different kinds of bacteria that can cause BV. BV is the most common vaginal infection in women of childbearing age. CAUSES   The cause of BV is not fully understood. BV develops when there is an increase or imbalance of harmful bacteria.  Some activities or behaviors can upset the normal balance of bacteria in the vagina and put women at increased risk including:  Having a new sex partner or multiple sex partners.  Douching.  Using an intrauterine device (IUD) for contraception.  It is not clear what role sexual activity plays in the development of BV. However, women that have never had sexual intercourse are rarely infected with BV. Women do not get BV from toilet seats, bedding, swimming pools or from touching objects around them.  SYMPTOMS   Grey vaginal discharge.  A fish-like odor with discharge, especially after sexual intercourse.  Itching or burning of the vagina and vulva.  Burning or pain with urination.  Some women have no signs or symptoms at all. DIAGNOSIS  Your caregiver must examine the vagina for signs of BV. Your caregiver will perform lab tests and look at the sample of vaginal fluid through a microscope. They will look for bacteria and abnormal cells (clue cells), a pH test higher than 4.5, and a positive amine test all associated with BV.  RISKS AND COMPLICATIONS   Pelvic inflammatory disease (PID).  Infections following gynecology surgery.  Developing  HIV.  Developing herpes virus. TREATMENT  Sometimes BV will clear up without treatment. However, all women with symptoms of BV should be treated to avoid complications, especially if gynecology surgery is planned. Female partners generally do not need to be treated. However, BV may spread between female sex partners so treatment is helpful in preventing a recurrence of BV.   BV may be treated with antibiotics. The antibiotics come in either pill or vaginal cream forms. Either can be used with nonpregnant or pregnant women, but the recommended dosages differ. These antibiotics are not harmful to the baby.  BV can recur after treatment. If this happens, a second round of antibiotics will often be prescribed.  Treatment is important for pregnant women. If not treated, BV can cause a premature delivery, especially for a pregnant woman who had a premature birth in the past. All pregnant women who have symptoms of BV should be checked and treated.  For chronic reoccurrence of BV, treatment with a type of prescribed gel vaginally twice a week is helpful. HOME CARE INSTRUCTIONS   Finish all medication as directed by your caregiver.  Do not have sex until treatment is completed.  Tell your sexual partner that you have a vaginal infection. They should see their caregiver and be treated if they have problems, such as a mild rash or itching.  Practice safe sex. Use condoms. Only have 1 sex partner. PREVENTION  Basic prevention steps can help reduce the risk of upsetting the natural balance of bacteria in the vagina and developing BV:  Do not have sexual intercourse (be abstinent).  Do not douche.  Use all of the medicine prescribed for treatment of BV, even if the signs and symptoms go away.  Tell your sex partner if you have BV. That way, they can be treated, if needed, to prevent reoccurrence. SEEK MEDICAL CARE IF:   Your symptoms are not improving after 3 days of treatment.  You have  increased discharge, pain, or fever. MAKE SURE YOU:   Understand these instructions.  Will watch your condition.  Will get help right away if you are not doing well or get worse. FOR MORE INFORMATION  Division of STD Prevention (DSTDP), Centers for Disease Control and Prevention: [employer] American Social Health Association (ASHA): www.ashastd.org  Document Released: 10/31/2005 Document Revised: 01/23/2012 Document Reviewed: 04/23/2009 [facility] Patient Information 2014 [place], [place].

## 2013-05-16 ENCOUNTER — Ambulatory Visit (INDEPENDENT_AMBULATORY_CARE_PROVIDER_SITE_OTHER): Payer: 59 | Admitting: Emergency Medicine

## 2013-05-16 ENCOUNTER — Other Ambulatory Visit: Payer: Self-pay | Admitting: *Deleted

## 2013-05-16 VITALS — BP 156/108 | HR 98 | Temp 98.2°F | Resp 18 | Ht 63.5 in | Wt 295.0 lb

## 2013-05-16 DIAGNOSIS — H109 Unspecified conjunctivitis: Secondary | ICD-10-CM

## 2013-05-16 DIAGNOSIS — R51 Headache: Secondary | ICD-10-CM

## 2013-05-16 DIAGNOSIS — F419 Anxiety disorder, unspecified: Secondary | ICD-10-CM

## 2013-05-16 MED ORDER — POLYMYXIN B-TRIMETHOPRIM 10000-0.1 UNIT/ML-% OP SOLN: 1.0000 [drp] | OPHTHALMIC | Status: DC

## 2013-05-16 MED ORDER — KETOROLAC TROMETHAMINE 60 MG/2ML IM SOLN
60.0000 mg | Freq: Once | INTRAMUSCULAR | Status: AC
  Administered 2013-05-16: 60 mg via INTRAMUSCULAR

## 2013-05-16 NOTE — Progress Notes (Signed)
  Subjective:    Patient ID: [name], female    DOB: 1971-03-20, 42 y.o.   MRN: [id number]  HPI  Presents with headache x 2 days and bilateral eye irritation x 3 days. Headache is across the front of the forehead, squeezing in nature, Tylenol offered little relief. Pt suspects headache is from lack of BP control as she has been out of her medication for approximately 1.5 weeks. She will pick up more BP medication today. Similar to headaches in the past, Toradol typically alleviates pain. Denies vision changes, aura symptoms, nausea/vomiting. This morning when when she sat up was dizzy briefly. Currently under more stress than normal.   Review of Systems Denies: fever, changes in vision, SOB, chest pain    Objective:   Physical Exam  BP 156/108  Pulse 98  Temp(Src) 98.2 F (36.8 C) (Oral)  Resp 18  Ht 5' 3.5" (1.613 m)  Wt 295 lb (133.811 kg)  BMI 51.43 kg/m2  SpO2 97%  LMP 05/08/2013  General: WDWN female, appears stated age, NAD, but appears to not feel well HEENT: normocephalic, atraumatic, PERLA, eyelashes have matted yellow purulent discharge, conjunctiva are injected and erythematous bilaterally - right eye is more injected than left  Resp: good air entry, CTA anterior & posterior fields bilaterally, without rales rhonchi wheezes Cardiac: RRR, no murmurs rubs gallops  Extremities: moves all limbs spontaneously, strength 5/5 UE & LE  Neuro: A&O x 3, CN II-XII grossly intact, cerebellar intact  Skin: periorbital area is slightly edematous      Assessment & Plan:  Headache(784.0) - Plan: ketorolac (TORADOL) injection 60 mg  Conjunctivitis - Plan: trimethoprim-polymyxin b (POLYTRIM) ophthalmic solution  Suspect headache is related to lack of BP medication.

## 2013-05-16 NOTE — Progress Notes (Deleted)
  Subjective:    Patient ID: [name], female    DOB: 12-Dec-1970, 42 y.o.   MRN: [id number]  HPI  42 YO female patient here today with a headache that has been going on for the past 2 days. She has tried OTC pain reliever and found no relief. She has been out of her hypertension medication. She was paid today and will pick it up this afternoon. She states that when she gets headaches like this she gets a Toradol shot.   Her son was diagnosed with conjunctivitis last week. Her right eye started draining on Monday. She is having yellow drainage that crust over during the night. Denies itching. Eye is red and swollen.   Review of Systems     Objective:   Physical Exam        Assessment & Plan:

## 2013-05-17 ENCOUNTER — Telehealth: Payer: Self-pay

## 2013-05-17 NOTE — Telephone Encounter (Signed)
Patient can have a refill on her Ambien. She is on 5 mg. She can have #30. She can have 2 refills

## 2013-05-17 NOTE — Telephone Encounter (Signed)
Dr. Cleta Alberts, pt would like a RF of her Ambien please. Thanks

## 2013-05-18 ENCOUNTER — Telehealth: Payer: Self-pay

## 2013-05-18 NOTE — Telephone Encounter (Signed)
error 

## 2013-05-18 NOTE — Telephone Encounter (Signed)
Called in for pt

## 2013-06-06 ENCOUNTER — Telehealth: Payer: Self-pay

## 2013-06-06 ENCOUNTER — Ambulatory Visit (INDEPENDENT_AMBULATORY_CARE_PROVIDER_SITE_OTHER): Payer: 59 | Admitting: Physician Assistant

## 2013-06-06 VITALS — BP 146/82 | HR 94 | Temp 98.2°F | Resp 20

## 2013-06-06 DIAGNOSIS — F410 Panic disorder [episodic paroxysmal anxiety] without agoraphobia: Secondary | ICD-10-CM

## 2013-06-06 DIAGNOSIS — F430 Acute stress reaction: Secondary | ICD-10-CM

## 2013-06-06 DIAGNOSIS — F4320 Adjustment disorder, unspecified: Secondary | ICD-10-CM

## 2013-06-06 MED ORDER — ALPRAZOLAM 0.5 MG PO TABS: 0.2500 mg | ORAL_TABLET | Freq: Two times a day (BID) | ORAL | Status: DC | PRN

## 2013-06-06 MED ORDER — ALPRAZOLAM 0.25 MG PO TABS
0.5000 mg | ORAL_TABLET | Freq: Once | ORAL | Status: AC
  Administered 2013-06-06: 0.5 mg via ORAL

## 2013-06-06 NOTE — Progress Notes (Signed)
   [address] [place] [address]   Phone [phone]  Subjective:    Patient ID: [name], female    DOB: 1971-08-24, 42 y.o.   MRN: [id number]  HPI Pt presents to clinic with concerns that she is having a panic attack.  She is under an extreme amount of stress because last pm her son went to jail.  Currently she feels that she is nauseated and dizzy and feels like there is something in her throat that she cannot breath correctly.  She has never had a panic attack but wonders if that cold be what it is.  She did not have any problems earlier today until she started getting phone calls regarding her son.   Review of Systems     Objective:   Physical Exam  Vitals reviewed. Constitutional: She is oriented to person, place, and time. She appears well-developed and well-nourished.  Pt sitting in room clutching her chest and rocking back and forth.  HENT:  Head: Normocephalic and atraumatic.  Right Ear: External ear normal.  Left Ear: External ear normal.  Cardiovascular: Normal rate, regular rhythm and normal heart sounds.   No murmur heard. Pulmonary/Chest: Effort normal and breath sounds normal.  Neurological: She is alert and oriented to person, place, and time.  Skin: Skin is warm and dry.  Psychiatric: She has a normal mood and affect. Her behavior is normal. Judgment and thought content normal.   Pt given Xanax 0.5mg  and in about 5-10 mins her symptoms resolved and she felt like a medicine hangover.         Assessment & Plan:  Anxiety attack - Plan: ALPRAZolam Prudy Feeler) tablet 0.5 mg  Adult situational stress disorder - Plan: ALPRAZolam (XANAX) 0.5 MG tablet  D/w patient now that he is in jail her stress from the last several months may be reduced but if she continues to need xanax daily we might want to think about daily medication for anxiety.  Benny Lennert PA-C 06/06/2013 7:37 PM

## 2013-06-06 NOTE — Telephone Encounter (Signed)
[name] is requesting RF of her alprazolam.

## 2013-06-06 NOTE — Telephone Encounter (Signed)
Ok to refill 

## 2013-06-07 MED ORDER — ALPRAZOLAM 0.5 MG PO TABS: 0.2500 mg | ORAL_TABLET | Freq: Two times a day (BID) | ORAL | Status: DC | PRN

## 2013-06-07 NOTE — Telephone Encounter (Signed)
Sarah wrote Rx for pt yesterday. Not needed.

## 2013-07-10 ENCOUNTER — Other Ambulatory Visit: Payer: Self-pay | Admitting: Radiology

## 2013-07-10 DIAGNOSIS — F4320 Adjustment disorder, unspecified: Secondary | ICD-10-CM

## 2013-07-10 MED ORDER — ALPRAZOLAM 0.5 MG PO TABS: 0.5000 mg | ORAL_TABLET | Freq: Two times a day (BID) | ORAL | Status: DC | PRN

## 2013-07-10 NOTE — Telephone Encounter (Signed)
Patient would like a refill of her Alprazolam. Situational stress with son (his situation has not changed), has been taking bid as directed by Benny Lennert PA and Dr Cleta Alberts. Pended Rx please advise.

## 2013-08-26 ENCOUNTER — Other Ambulatory Visit: Payer: Self-pay | Admitting: Radiology

## 2013-08-26 DIAGNOSIS — F4320 Adjustment disorder, unspecified: Secondary | ICD-10-CM

## 2013-08-26 NOTE — Telephone Encounter (Signed)
Patient has 2 alprazolam left, needs renewal, pended please advise.

## 2013-08-27 MED ORDER — ALPRAZOLAM 0.5 MG PO TABS: 0.5000 mg | ORAL_TABLET | Freq: Two times a day (BID) | ORAL | Status: DC | PRN

## 2013-09-10 ENCOUNTER — Ambulatory Visit (INDEPENDENT_AMBULATORY_CARE_PROVIDER_SITE_OTHER): Payer: 59 | Admitting: Physician Assistant

## 2013-09-10 ENCOUNTER — Encounter: Payer: Self-pay | Admitting: Physician Assistant

## 2013-09-10 VITALS — BP 138/100 | HR 87 | Temp 99.0°F | Resp 16

## 2013-09-10 DIAGNOSIS — G43909 Migraine, unspecified, not intractable, without status migrainosus: Secondary | ICD-10-CM

## 2013-09-10 DIAGNOSIS — I10 Essential (primary) hypertension: Secondary | ICD-10-CM

## 2013-09-10 DIAGNOSIS — Z862 Personal history of diseases of the blood and blood-forming organs and certain disorders involving the immune mechanism: Secondary | ICD-10-CM

## 2013-09-10 LAB — POCT CBC
Granulocyte percent: 67.8 %G (ref 37–80)
MID (cbc): 0.5 (ref 0–0.9)
MPV: 10.5 fL (ref 0–99.8)
POC Granulocyte: 6.3 (ref 2–6.9)
POC LYMPH PERCENT: 26.9 %L (ref 10–50)
POC MID %: 5.3 %M (ref 0–12)
Platelet Count, POC: 264 10*3/uL (ref 142–424)
RBC: 5.37 M/uL (ref 4.04–5.48)
RDW, POC: 16.8 %
WBC: 9.3 10*3/uL (ref 4.6–10.2)

## 2013-09-10 MED ORDER — KETOROLAC TROMETHAMINE 60 MG/2ML IM SOLN
60.0000 mg | Freq: Once | INTRAMUSCULAR | Status: AC
  Administered 2013-09-10: 60 mg via INTRAMUSCULAR

## 2013-09-10 MED ORDER — RIZATRIPTAN BENZOATE 10 MG PO TABS: 10.0000 mg | ORAL_TABLET | ORAL | Status: DC | PRN

## 2013-09-10 NOTE — Progress Notes (Signed)
[address], [place] [address]   Phone [phone]  Subjective:    Patient ID: [name], female    DOB: 12/14/1970, 42 y.o.   MRN: [id number]  HPI Pt presents to clinic with 24h headache.  She woke up yesterday and felt fine and then she started to develop a headache which has gotten worse since then.  She felt fair until she got to work and then bright lights have made her headache worse.  She is photophobic and feels unsteady but she thinks it is because the pain is worse when her eyes are opened.  She has h/o these headache prior to her menses but her menses was last week.  She does not have any nausea with the headache - it is bilateral frontal in origin and pounding.  She has tried no medication for the pain.  She has other types of headaches.  She has never been on a Triptan.  She will sometimes gets headaches when she gets really anemic but she has not been anemic in a while.    Review of Systems  Eyes: Positive for photophobia.  Neurological: Positive for light-headedness and headaches.       Objective:   Physical Exam  Vitals reviewed. Constitutional: She is oriented to person, place, and time. She appears well-developed and well-nourished.  HENT:  Head: Normocephalic and atraumatic.  Right Ear: External ear normal.  Left Ear: External ear normal.  Eyes: Conjunctivae, EOM and lids are normal. Pupils are equal, round, and reactive to light. Right eye exhibits no nystagmus. Left eye exhibits no nystagmus.  Photophobic.  Cardiovascular: Normal rate, regular rhythm and normal heart sounds.   No murmur heard. Pulmonary/Chest: Effort normal and breath sounds normal.  Neurological: She is alert and oriented to person, place, and time. She has normal reflexes.  Skin: Skin is warm and dry.  Psychiatric: She has a normal mood and affect. Her behavior is normal. Judgment and thought content normal.   Results for orders placed in visit on 09/10/13  POCT CBC      Result  Value Range   WBC 9.3  4.6 - 10.2 K/uL   Lymph, poc 2.5  0.6 - 3.4   POC LYMPH PERCENT 26.9  10 - 50 %L   MID (cbc) 0.5  0 - 0.9   POC MID % 5.3  0 - 12 %M   POC Granulocyte 6.3  2 - 6.9   Granulocyte percent 67.8  37 - 80 %G   RBC 5.37  4.04 - 5.48 M/uL   Hemoglobin 13.2  12.2 - 16.2 g/dL   HCT, POC 46.9  62.9 - 47.9 %   MCV 78.0 (*) 80 - 97 fL   MCH, POC 24.6 (*) 27 - 31.2 pg   MCHC 31.5 (*) 31.8 - 35.4 g/dL   RDW, POC 52.8     Platelet Count, POC 264  142 - 424 K/uL   MPV 10.5  0 - 99.8 fL        Assessment & Plan:  Migraine - This sounds like the patients normal migraine but with different timing.  Due to her having these headaches around her menses we will start an abortive medication for future headaches.  Her BP is elevated today but her plan is to go home and sleep and then she will recheck her BP here tonight.  If still elevated we will determine the next step at that time.  Plan: ketorolac (TORADOL) injection 60 mg, rizatriptan (  MAXALT) 10 MG tablet  History of anemia - Currently not anemic. Plan: POCT CBC  Benny Lennert [facility] 09/10/2013 12:46 PM

## 2013-10-01 ENCOUNTER — Telehealth: Payer: Self-pay

## 2013-10-01 ENCOUNTER — Other Ambulatory Visit: Payer: Self-pay | Admitting: *Deleted

## 2013-10-01 DIAGNOSIS — F419 Anxiety disorder, unspecified: Secondary | ICD-10-CM

## 2013-10-01 MED ORDER — ZOLPIDEM TARTRATE ER 6.25 MG PO TBCR: EXTENDED_RELEASE_TABLET | ORAL | Status: DC

## 2013-10-01 NOTE — Telephone Encounter (Signed)
Pt needs a refill on ambien 5mg 

## 2013-10-01 NOTE — Telephone Encounter (Signed)
[name] requested that this request be sent to Dr Cleta Alberts so that he could put more than one RF on the Rx if possible so she won't have to request it every month. I have pended the Rx BUT THE # RFs NEEDS to be reviewed and changed to what Dr Cleta Alberts deems appropriate. Verified w/pt that the correct strength is the 6.25 CR tablet.

## 2013-10-01 NOTE — Telephone Encounter (Signed)
Rx for Ambien called in to [facility] with 2 RF's Ok'ed per Dr. Cleta Alberts.

## 2013-10-01 NOTE — Telephone Encounter (Signed)
She can have 2 refills on her Ambien CR 6.25. She can have this prescription +2 refills

## 2013-10-02 MED ORDER — ZOLPIDEM TARTRATE ER 6.25 MG PO TBCR: EXTENDED_RELEASE_TABLET | ORAL | Status: DC

## 2013-10-02 NOTE — Addendum Note (Signed)
Addended by: Sheppard Plumber A on: 10/02/2013 01:30 PM   Modules accepted: Orders

## 2013-10-02 NOTE — Telephone Encounter (Signed)
Called into pharm  

## 2013-10-22 ENCOUNTER — Encounter: Payer: Self-pay | Admitting: Physician Assistant

## 2013-10-22 ENCOUNTER — Ambulatory Visit (INDEPENDENT_AMBULATORY_CARE_PROVIDER_SITE_OTHER): Payer: 59 | Admitting: Physician Assistant

## 2013-10-22 VITALS — BP 142/98 | HR 114 | Temp 98.9°F | Resp 16

## 2013-10-22 DIAGNOSIS — R82998 Other abnormal findings in urine: Secondary | ICD-10-CM

## 2013-10-22 DIAGNOSIS — N760 Acute vaginitis: Secondary | ICD-10-CM

## 2013-10-22 DIAGNOSIS — B9689 Other specified bacterial agents as the cause of diseases classified elsewhere: Secondary | ICD-10-CM

## 2013-10-22 DIAGNOSIS — R8290 Unspecified abnormal findings in urine: Secondary | ICD-10-CM

## 2013-10-22 LAB — POCT WET PREP WITH KOH
Clue Cells Wet Prep HPF POC: 100
KOH Prep POC: NEGATIVE
RBC Wet Prep HPF POC: NEGATIVE
Trichomonas, UA: NEGATIVE

## 2013-10-22 LAB — POCT UA - MICROSCOPIC ONLY
RBC, urine, microscopic: NEGATIVE
Yeast, UA: NEGATIVE

## 2013-10-22 LAB — POCT URINALYSIS DIPSTICK
Glucose, UA: NEGATIVE
Leukocytes, UA: NEGATIVE
Nitrite, UA: NEGATIVE
Spec Grav, UA: 1.02
Urobilinogen, UA: 0.2

## 2013-10-22 MED ORDER — METRONIDAZOLE 500 MG PO TABS: 500.0000 mg | ORAL_TABLET | Freq: Two times a day (BID) | ORAL | Status: DC

## 2013-10-22 NOTE — Progress Notes (Signed)
Patient ID: [name] MRN: [id number], DOB: 04-12-1971, 42 y.o. Date of Encounter: 10/22/2013, 7:57 PM  Primary Physician: Lucilla Edin, MD  Chief Complaint: Vaginal discharge x 4 days  HPI: 42 y.o. female with history below presents with vaginal discharge x 4 days. Patient had sexual intercourse with usual partner on 10/18/13. He used a condom during this episode. With certain types of condom usage she will later develop bacterial vaginosis. She is not certain if it is the latex itself or the lubricant along the external surface of the condom. She complains of a watery discharge from her vaginal canal. No dysuria, urinary frequency, or urinary urgency. No other vaginal complaints. No abdominal pain, flank pain, or low back pain. No new sexual partners. No concerns on her part for STD's.      Past Medical History  Diagnosis Date  . Hypertension   . Smoker   . Allergy   . Anemia   . Depression   . Anxiety   . Sickle cell trait   . Iron overload, transfusional   . Sickle cell anemia     trait  . Clotting disorder     from beeing anemic     Home Meds: Prior to Admission medications   Medication Sig Start Date End Date Taking? Authorizing Provider  ALPRAZolam Prudy Feeler) 0.5 MG tablet Take 1 tablet (0.5 mg total) by mouth 2 (two) times daily as needed for anxiety. 08/26/13  Yes Collene Gobble, MD  rizatriptan (MAXALT) 10 MG tablet Take 1 tablet (10 mg total) by mouth as needed for migraine. May repeat in 2 hours if needed 09/10/13  Yes Morrell Riddle, PA-C  telmisartan-hydrochlorothiazide (MICARDIS HCT) 80-12.5 MG per tablet One daily 04/14/13  Yes Chelle S Jeffery, PA-C  zolpidem (AMBIEN CR) 6.25 MG CR tablet Take one at bedtime 10/02/13  Yes Collene Gobble, MD           Allergies:  Allergies  Allergen Reactions  . Iron Shortness Of Breath    IV only   . Delsym [Dextromethorphan Polistirex Er]     nightmares  . Doxycycline     Bad taste to patient    History   Social  History  . Marital Status: Single    Spouse Name: N/A    Number of Children: N/A  . Years of Education: N/A   Occupational History  . Not on file.   Social History Main Topics  . Smoking status: Current Every Day Smoker -- 20 years    Types: Cigarettes  . Smokeless tobacco: Never Used     Comment: smoke 5 cigarettes/day or prn  . Alcohol Use: Yes     Comment: once maybe a month if that  . Drug Use: No  . Sexual Activity: Yes    Partners: Male    Birth Control/ Protection: Condom   Other Topics Concern  . Not on file   Social History Narrative  . No narrative on file     Review of Systems: Constitutional: negative for chills, fever, or fatigue  HEENT: negative for vision changes or hearing loss Cardiovascular: negative for chest pain or palpitations Respiratory: negative for wheezing, shortness of breath, or cough Abdominal: negative for abdominal pain, nausea, vomiting, or diarrhea Genitourinary: positive for vaginal discharge. Negative for dysuria, urinary frequency, urinary urgency, vaginal pain, abnormal vaginal bleeding, dyspareunia, or menopause symptoms   Dermatological: negative for rash Neurologic: negative for headache   Physical Exam: Blood pressure 142/98, pulse 114,  temperature 98.9 F (37.2 C), resp. rate 16, last menstrual period 10/09/2013., There is no weight on file to calculate BMI. General: Well developed, well nourished, in no acute distress. Head: Normocephalic, atraumatic, eyes without discharge, sclera non-icteric, nares are without discharge.   Neck: Supple. Full ROM.  Lungs: Breathing is unlabored. Heart: Regular rate. Msk:  Strength and tone normal for age. Extremities/Skin: Warm and dry. No clubbing or cyanosis. No edema. No rashes or suspicious lesions. Neuro: Alert and oriented X 3. Moves all extremities spontaneously. Gait is normal. CNII-XII grossly in tact. Psych:  Responds to questions appropriately with a normal affect.    Labs: Self collect wet prep  Results for orders placed in visit on 10/22/13  POCT URINALYSIS DIPSTICK      Result Value Range   Color, UA yellow     Clarity, UA hazy     Glucose, UA neg     Bilirubin, UA neg     Ketones, UA neg     Spec Grav, UA 1.020     Blood, UA neg     pH, UA 7.0     Protein, UA trace     Urobilinogen, UA 0.2     Nitrite, UA neg     Leukocytes, UA Negative    POCT UA - MICROSCOPIC ONLY      Result Value Range   WBC, Ur, HPF, POC 0-2     RBC, urine, microscopic neg     Bacteria, U Microscopic trace     Mucus, UA neg     Epithelial cells, urine per micros 5-8     Crystals, Ur, HPF, POC neg     Casts, Ur, LPF, POC neg     Yeast, UA neg    POCT WET PREP WITH KOH      Result Value Range   Trichomonas, UA Negative     Clue Cells Wet Prep HPF POC 100%     Epithelial Wet Prep HPF POC 5-10     Yeast Wet Prep HPF POC neg     Bacteria Wet Prep HPF POC 4+     RBC Wet Prep HPF POC neg     WBC Wet Prep HPF POC neg     KOH Prep POC Negative       ASSESSMENT AND PLAN:  42 y.o. female with bacterial vaginosis  -Flagyl 500 mg 1 po bid #14 no RF -Look in to different types/brands of condoms -Avoid ETOH use  -RTC precautions   Signed, Eula Listen, PA-C [facility] [place], [place] [address] [phone] 10/22/2013 7:57 PM

## 2013-11-18 ENCOUNTER — Other Ambulatory Visit: Payer: Self-pay | Admitting: Radiology

## 2013-11-18 DIAGNOSIS — F4320 Adjustment disorder, unspecified: Secondary | ICD-10-CM

## 2013-11-18 MED ORDER — ALPRAZOLAM 0.5 MG PO TABS: 0.5000 mg | ORAL_TABLET | Freq: Two times a day (BID) | ORAL | Status: DC | PRN

## 2013-11-18 NOTE — Telephone Encounter (Signed)
Patient requesting refill on Alprazolam, please advise

## 2013-12-12 ENCOUNTER — Ambulatory Visit (INDEPENDENT_AMBULATORY_CARE_PROVIDER_SITE_OTHER): Payer: 59 | Admitting: Physician Assistant

## 2013-12-12 VITALS — BP 154/84 | HR 102 | Temp 98.5°F | Resp 18 | Ht 63.0 in

## 2013-12-12 DIAGNOSIS — R059 Cough, unspecified: Secondary | ICD-10-CM

## 2013-12-12 DIAGNOSIS — R5383 Other fatigue: Secondary | ICD-10-CM

## 2013-12-12 DIAGNOSIS — R635 Abnormal weight gain: Secondary | ICD-10-CM

## 2013-12-12 DIAGNOSIS — R5381 Other malaise: Secondary | ICD-10-CM

## 2013-12-12 DIAGNOSIS — R05 Cough: Secondary | ICD-10-CM

## 2013-12-12 DIAGNOSIS — I10 Essential (primary) hypertension: Secondary | ICD-10-CM

## 2013-12-12 DIAGNOSIS — Z113 Encounter for screening for infections with a predominantly sexual mode of transmission: Secondary | ICD-10-CM

## 2013-12-12 LAB — COMPREHENSIVE METABOLIC PANEL
ALT: 25 U/L (ref 0–35)
AST: 22 U/L (ref 0–37)
Albumin: 4.1 g/dL (ref 3.5–5.2)
Alkaline Phosphatase: 67 U/L (ref 39–117)
BUN: 10 mg/dL (ref 6–23)
CO2: 26 mEq/L (ref 19–32)
Calcium: 10.3 mg/dL (ref 8.4–10.5)
Chloride: 105 mEq/L (ref 96–112)
Creat: 0.79 mg/dL (ref 0.50–1.10)
Glucose, Bld: 108 mg/dL — ABNORMAL HIGH (ref 70–99)
Potassium: 4.3 mEq/L (ref 3.5–5.3)
Sodium: 140 mEq/L (ref 135–145)
Total Bilirubin: 0.3 mg/dL (ref 0.2–1.2)
Total Protein: 7.3 g/dL (ref 6.0–8.3)

## 2013-12-12 LAB — POCT CBC
Granulocyte percent: 71.4 %G (ref 37–80)
HCT, POC: 41.6 % (ref 37.7–47.9)
Hemoglobin: 13 g/dL (ref 12.2–16.2)
Lymph, poc: 2.5 (ref 0.6–3.4)
MCH, POC: 24.6 pg — AB (ref 27–31.2)
MCHC: 31.3 g/dL — AB (ref 31.8–35.4)
MCV: 78.6 fL — AB (ref 80–97)
MID (cbc): 0.6 (ref 0–0.9)
MPV: 12.4 fL (ref 0–99.8)
POC Granulocyte: 7.7 — AB (ref 2–6.9)
POC LYMPH PERCENT: 23.3 %L (ref 10–50)
POC MID %: 5.3 %M (ref 0–12)
Platelet Count, POC: 266 10*3/uL (ref 142–424)
RBC: 5.29 M/uL (ref 4.04–5.48)
RDW, POC: 17 %
WBC: 10.8 10*3/uL — AB (ref 4.6–10.2)

## 2013-12-12 LAB — LIPID PANEL
Cholesterol: 177 mg/dL (ref 0–200)
HDL: 42 mg/dL (ref >39)
LDL Cholesterol: 113 mg/dL — ABNORMAL HIGH (ref 0–99)
Total CHOL/HDL Ratio: 4.2 Ratio
Triglycerides: 112 mg/dL (ref <150)
VLDL: 22 mg/dL (ref 0–40)

## 2013-12-12 LAB — TSH: TSH: 0.904 u[IU]/mL (ref 0.350–4.500)

## 2013-12-12 LAB — HIV ANTIBODY (ROUTINE TESTING W REFLEX): HIV: NONREACTIVE

## 2013-12-12 MED ORDER — AZITHROMYCIN 250 MG PO TABS: ORAL_TABLET | ORAL | Status: DC

## 2013-12-12 NOTE — Progress Notes (Signed)
Subjective:    Patient ID: [name], female    DOB: Dec 04, 1970, 43 y.o.   MRN: [id number]  HPI 43 year old female presents for evaluation of cough x 3 days.  States it is productive of brown/yellow sputum in the morning and yellow mucous throughout the day.   Admits to slight PND and rhinorrhea but no sinus pain or pressure. Denies fever, chills, otalgia, sore throat, chest pain, SOB, wheezing, nausea, vomiting, or hemoptysis.  She has taken Theraflu which does seem to help loosen her mucous.  Hx of cough last year that lasted for months so she is concerned and wants to "knock this out."  Also requesting labwork today.  Wants fasting labs checked as well as TSH and HIV. No hx of thyroid abnormalities but does have fatigue and weight gain.  No specific concern about HIV but likes to be screened regularly.    HTN - BP slightly elevated today. Patient has not taken her medication yet this morning.     Review of Systems  Constitutional: Positive for fatigue and unexpected weight change (weight gain). Negative for fever, chills, activity change and appetite change.  HENT: Positive for postnasal drip and rhinorrhea. Negative for congestion, sinus pressure and sore throat.   Respiratory: Positive for cough. Negative for shortness of breath and wheezing.   Cardiovascular: Negative for chest pain.  Gastrointestinal: Negative for nausea, vomiting and abdominal pain.  Neurological: Negative for dizziness and headaches.       Objective:   Physical Exam  Constitutional: She is oriented to person, place, and time. She appears well-developed and well-nourished.  HENT:  Head: Normocephalic and atraumatic.  Right Ear: Hearing, tympanic membrane, external ear and ear canal normal.  Left Ear: Hearing, tympanic membrane, external ear and ear canal normal.  Mouth/Throat: Uvula is midline, oropharynx is clear and moist and mucous membranes are normal.  Eyes: Conjunctivae are normal.  Neck: Normal  range of motion. Neck supple.  Cardiovascular: Normal rate, regular rhythm and normal heart sounds.   Pulmonary/Chest: Effort normal and breath sounds normal.  Lymphadenopathy:    She has no cervical adenopathy.  Neurological: She is alert and oriented to person, place, and time.  Psychiatric: She has a normal mood and affect. Her behavior is normal. Judgment and thought content normal.    Results for orders placed in visit on 12/12/13  POCT CBC      Result Value Range   WBC 10.8 (*) 4.6 - 10.2 K/uL   Lymph, poc 2.5  0.6 - 3.4   POC LYMPH PERCENT 23.3  10 - 50 %L   MID (cbc) 0.6  0 - 0.9   POC MID % 5.3  0 - 12 %M   POC Granulocyte 7.7 (*) 2 - 6.9   Granulocyte percent 71.4  37 - 80 %G   RBC 5.29  4.04 - 5.48 M/uL   Hemoglobin 13.0  12.2 - 16.2 g/dL   HCT, POC 41.6  37.7 - 47.9 %   MCV 78.6 (*) 80 - 97 fL   MCH, POC 24.6 (*) 27 - 31.2 pg   MCHC 31.3 (*) 31.8 - 35.4 g/dL   RDW, POC 17.0     Platelet Count, POC 266  142 - 424 K/uL   MPV 12.4  0 - 99.8 fL         Assessment & Plan:  Hypertension - Plan: Lipid panel, Comprehensive metabolic panel  Screening for venereal disease - Plan: HIV antibody  Cough -  Plan: POCT CBC, azithromycin (ZITHROMAX) 250 MG tablet  Weight gain - Plan: TSH  Other malaise and fatigue - Plan: TSH  Labs pending CBC normal but will go ahead and try a Zpack as directed Recommend Mucinex OTC twice daily to help with chest congestion Patient declined cough syrup today but ok to rx if needed Follow up if symptoms worsen or fail to improve.

## 2013-12-17 ENCOUNTER — Telehealth: Payer: Self-pay | Admitting: Family Medicine

## 2013-12-17 NOTE — Telephone Encounter (Signed)
Patient requesting lab results. Results are back but has not been reviewed. Please review.  Cough has gotten worse. Productive with thick yellowish/brown mucous. Cough is so bad sometimes causes vomiting. She has been using inhaler

## 2013-12-18 NOTE — Telephone Encounter (Signed)
Patient notified and is aware of lab results.

## 2013-12-30 ENCOUNTER — Other Ambulatory Visit: Payer: Self-pay | Admitting: Radiology

## 2013-12-30 DIAGNOSIS — F4320 Adjustment disorder, unspecified: Secondary | ICD-10-CM

## 2013-12-30 MED ORDER — TELMISARTAN-HCTZ 80-12.5 MG PO TABS: ORAL_TABLET | ORAL | Status: DC

## 2013-12-30 MED ORDER — ALPRAZOLAM 0.5 MG PO TABS: 0.5000 mg | ORAL_TABLET | Freq: Two times a day (BID) | ORAL | Status: DC | PRN

## 2013-12-30 NOTE — Telephone Encounter (Signed)
Please advise on refill of Alprazolam, pended 

## 2014-03-27 ENCOUNTER — Other Ambulatory Visit: Payer: Self-pay | Admitting: Radiology

## 2014-03-27 MED ORDER — TELMISARTAN-HCTZ 80-12.5 MG PO TABS: ORAL_TABLET | ORAL | Status: DC

## 2014-03-31 ENCOUNTER — Ambulatory Visit (INDEPENDENT_AMBULATORY_CARE_PROVIDER_SITE_OTHER): Payer: 59 | Admitting: Emergency Medicine

## 2014-03-31 VITALS — BP 128/88 | HR 97 | Temp 98.6°F | Resp 18 | Ht 63.5 in | Wt 303.0 lb

## 2014-03-31 DIAGNOSIS — G4733 Obstructive sleep apnea (adult) (pediatric): Secondary | ICD-10-CM

## 2014-03-31 DIAGNOSIS — I10 Essential (primary) hypertension: Secondary | ICD-10-CM

## 2014-03-31 NOTE — Patient Instructions (Signed)

## 2014-03-31 NOTE — Progress Notes (Signed)
[facility] [address] [place] [address] [phone]  Date:  03/31/2014   Name:  [name]   DOB:  July 15, 1971   MRN:  [id number]  PCP:  Jenny Reichmann, MD    Chief Complaint: No chief complaint on file.   History of Present Illness:  [name] is a 43 y.o. very pleasant female patient who presents with the following:  Patient has a history of excessive daytime sleepiness with falling asleep on the phone and while driving and snoring at night.  She was told by family that she stops breathing frequently while sleeping.  Has gained weight recently.  Epworth sleepiness test score 15.  10 is suggestive of OSA.  Denies other complaint or health concern today.   Patient Active Problem List   Diagnosis Date Noted  . Cough 03/19/2013  . HTN (hypertension) 01/14/2012  . Anemia 01/14/2012  . Sickle cell trait 01/14/2012    Past Medical History  Diagnosis Date  . Hypertension   . Smoker   . Allergy   . Anemia   . Depression   . Anxiety   . Sickle cell trait   . Iron overload, transfusional   . Sickle cell anemia     trait  . Clotting disorder     from beeing anemic    Past Surgical History  Procedure Laterality Date  . Cervical ablation    . Tubal ligation    . Cholecystectomy    . Cesarean section      3 times    History  Substance Use Topics  . Smoking status: Current Every Day Smoker -- 20 years    Types: Cigarettes  . Smokeless tobacco: Never Used     Comment: smoke 5 cigarettes/day or prn  . Alcohol Use: Yes     Comment: once maybe a month if that    Family History  Problem Relation Age of Onset  . Mental illness Mother   . Hyperlipidemia Mother   . Hyperthyroidism Mother   . Mental retardation Mother   . ADD / ADHD Son   . Diabetes Father   . Cancer Father   . Cancer Paternal Aunt     breast  . Arthritis Maternal Grandmother   . Diabetes Maternal Grandmother   . Hearing loss Maternal Grandmother     left hear  when she was a child  . Hyperlipidemia Maternal Grandmother   . Hypertension Maternal Grandmother   . Alcohol abuse Maternal Grandfather   . Early death Maternal Grandfather   . Cancer Paternal Aunt     breast    Allergies  Allergen Reactions  . Iron Shortness Of Breath    IV only   . Delsym [Dextromethorphan Polistirex Er]     nightmares  . Doxycycline     Bad taste to patient    Medication list has been reviewed and updated.  Current Outpatient Prescriptions on File Prior to Visit  Medication Sig Dispense Refill  . telmisartan-hydrochlorothiazide (MICARDIS HCT) 80-12.5 MG per tablet One daily  90 tablet  0  . ALPRAZolam (XANAX) 0.5 MG tablet Take 1 tablet (0.5 mg total) by mouth 2 (two) times daily as needed for anxiety.  60 tablet  0  . azithromycin (ZITHROMAX) 250 MG tablet Take 2 tabs PO x 1 dose, then 1 tab PO QD x 4 days  6 tablet  0  . metroNIDAZOLE (FLAGYL) 500 MG tablet Take 1 tablet (500 mg total)  by mouth 2 (two) times daily.  14 tablet  0  . rizatriptan (MAXALT) 10 MG tablet Take 1 tablet (10 mg total) by mouth as needed for migraine. May repeat in 2 hours if needed  10 tablet  0  . zolpidem (AMBIEN CR) 6.25 MG CR tablet Take one at bedtime  30 tablet  2   No current facility-administered medications on file prior to visit.    Review of Systems:  As per HPI, otherwise negative.    Physical Examination: Filed Vitals:   03/31/14 1729  BP: 128/88  Pulse: 97  Temp: 98.6 F (37 C)  Resp: 18   Filed Vitals:   03/31/14 1729  Height: 5' 3.5" (1.613 m)  Weight: 303 lb (137.44 kg)   Body mass index is 52.83 kg/(m^2). Ideal Body Weight: Weight in (lb) to have BMI = 25: 143.1   GEN: WDWN, NAD, Non-toxic, Alert & Oriented x 3 HEENT: Atraumatic, Normocephalic.  Short neck.  Closed pharynx Ears and Nose: No external deformity. EXTR: No clubbing/cyanosis/edema NEURO: Normal gait.  PSYCH: Normally interactive. Conversant. Not depressed or anxious appearing.   Calm demeanor.    Assessment and Plan: OSA Sleep study  Signed,  Ellison Carwin, MD

## 2014-04-01 NOTE — Addendum Note (Signed)
Addended byCandice Camp on: 04/01/2014 01:46 PM   Modules accepted: Orders

## 2014-04-22 ENCOUNTER — Ambulatory Visit (INDEPENDENT_AMBULATORY_CARE_PROVIDER_SITE_OTHER): Payer: 59 | Admitting: Physician Assistant

## 2014-04-22 VITALS — BP 127/85 | HR 100 | Temp 98.1°F | Resp 18 | Ht 64.0 in | Wt 303.4 lb

## 2014-04-22 DIAGNOSIS — R42 Dizziness and giddiness: Secondary | ICD-10-CM

## 2014-04-22 DIAGNOSIS — G43909 Migraine, unspecified, not intractable, without status migrainosus: Secondary | ICD-10-CM

## 2014-04-22 MED ORDER — RIZATRIPTAN BENZOATE 10 MG PO TABS
10.0000 mg | ORAL_TABLET | ORAL | Status: DC | PRN
Start: 2014-04-22 — End: 2014-12-17

## 2014-04-22 MED ORDER — MECLIZINE HCL 25 MG PO TABS: 25.0000 mg | ORAL_TABLET | Freq: Three times a day (TID) | ORAL | Status: DC | PRN

## 2014-04-22 NOTE — Progress Notes (Signed)
Subjective:    Patient ID: [name], female    DOB: 1971-03-03, 43 y.o.   MRN: [id number]  HPI Pt presents to clinic with feeling like she is falling forward that started this am.  On Sat (4 days ago), she had a couple of episodes of vertigo with the room spinning but the next day it was improved.  Yesterday she had a headache that felt like a migraine but was not at the normal timing of her migraines. She slept for several hours and the headache resolved but she felt weak and now she feels like she is falling forward.  She has been doing a home sleep study and for the last 2 nights she has been using a CPAP machine and it has a light on it that goes off when her oxygen goes to low and she has been sleeping lighter because she is wondering if the light has been going off and she is not getting enough oxygen.  She has also been off her BP meds for a few days but has not felt like her BP was high.  She otherwise feels fine.  She is having no URI symptoms, she is having no nausea.  She has not changed her meds recently.  Her urine is dark brown currently.  Review of Systems  Constitutional: Negative for fever and chills.  HENT: Negative for ear discharge and ear pain.   Gastrointestinal: Negative for nausea.  Allergic/Immunologic: Negative for environmental allergies.  Neurological: Positive for dizziness and headaches (none today). Negative for light-headedness.       Objective:   Physical Exam  Vitals reviewed. Constitutional: She is oriented to person, place, and time. She appears well-developed and well-nourished.  HENT:  Head: Normocephalic and atraumatic.  Right Ear: External ear normal.  Left Ear: External ear normal.  Eyes: Conjunctivae are normal. Pupils are equal, round, and reactive to light. Right eye exhibits no nystagmus. Left eye exhibits no nystagmus.  Neck: Normal range of motion.  Cardiovascular: Normal rate, regular rhythm and normal heart sounds.   Pulmonary/Chest:  Effort normal and breath sounds normal.  Neurological: She is alert and oriented to person, place, and time. She has normal strength. No cranial nerve deficit or sensory deficit. She displays a negative Romberg sign.  Reflex Scores:      Brachioradialis reflexes are 1+ on the right side and 1+ on the left side. Unable to obtain patella reflexes.  Pt sways with Romberg but she is able to catch herself.  Skin: Skin is warm and dry.  Psychiatric: She has a normal mood and affect. Her behavior is normal. Judgment and thought content normal.       Assessment & Plan:  Migraine - pt needs refills of her migraine medication that works well for HAs around her menses.  She had none yesterday to help with her headache.  Plan: rizatriptan (MAXALT) 10 MG tablet  Vertigo - It sounds like the patient is still having vertigo because of feeling like she is falling forward but it does seem to be better than 4 days ago when the room was spinning.  She will be careful with movement esp when going down steps and she will increase her fluid intake.  I am thinking that the patient most likely has sleep apnea and with her significant fatigue and recent home monitoring that her sleep has been more disrupted lately and that might be aggravating her position.  We discussed the above plan and she agrees -  she will RTC when if she is not improved with medication in 3-4 days for blood work.  Plan: meclizine (ANTIVERT) 25 MG tablet  Windell Hummingbird PA-C  [facility] 04/22/2014 11:35 AM

## 2014-04-22 NOTE — Patient Instructions (Signed)
Vertigo Vertigo means you feel like you or your surroundings are moving when they are not. Vertigo can be dangerous if it occurs when you are at work, driving, or performing difficult activities.  CAUSES  Vertigo occurs when there is a conflict of signals sent to your brain from the visual and sensory systems in your body. There are many different causes of vertigo, including:  Infections, especially in the inner ear.  A bad reaction to a drug or misuse of alcohol and medicines.  Withdrawal from drugs or alcohol.  Rapidly changing positions, such as lying down or rolling over in bed.  A migraine headache.  Decreased blood flow to the brain.  Increased pressure in the brain from a head injury, infection, tumor, or bleeding. SYMPTOMS  You may feel as though the world is spinning around or you are falling to the ground. Because your balance is upset, vertigo can cause nausea and vomiting. You may have involuntary eye movements (nystagmus). DIAGNOSIS  Vertigo is usually diagnosed by physical exam. If the cause of your vertigo is unknown, your caregiver may perform imaging tests, such as an MRI scan (magnetic resonance imaging). TREATMENT  Most cases of vertigo resolve on their own, without treatment. Depending on the cause, your caregiver may prescribe certain medicines. If your vertigo is related to body position issues, your caregiver may recommend movements or procedures to correct the problem. In rare cases, if your vertigo is caused by certain inner ear problems, you may need surgery. HOME CARE INSTRUCTIONS   Follow your caregiver's instructions.  Avoid driving.  Avoid operating heavy machinery.  Avoid performing any tasks that would be dangerous to you or others during a vertigo episode.  Tell your caregiver if you notice that certain medicines seem to be causing your vertigo. Some of the medicines used to treat vertigo episodes can actually make them worse in some people. SEEK  IMMEDIATE MEDICAL CARE IF:   Your medicines do not relieve your vertigo or are making it worse.  You develop problems with talking, walking, weakness, or using your arms, hands, or legs.  You develop severe headaches.  Your nausea or vomiting continues or gets worse.  You develop visual changes.  A family member notices behavioral changes.  Your condition gets worse. MAKE SURE YOU:  Understand these instructions.  Will watch your condition.  Will get help right away if you are not doing well or get worse. Document Released: 08/10/2005 Document Revised: 01/23/2012 Document Reviewed: 05/19/2011 [employer] Patient Information 2014 [employer].  

## 2014-05-09 ENCOUNTER — Telehealth: Payer: Self-pay | Admitting: Radiology

## 2014-05-09 NOTE — Telephone Encounter (Signed)
Patient waiting on her CPAP supplies, there was a delay with the study, now a delay with the supplies. Can you check on this, I faxed orders for supplies 2 weeks ago.  Faxed again yesterday.

## 2014-05-29 ENCOUNTER — Other Ambulatory Visit: Payer: Self-pay | Admitting: *Deleted

## 2014-05-29 ENCOUNTER — Other Ambulatory Visit: Payer: Self-pay | Admitting: Emergency Medicine

## 2014-05-29 MED ORDER — ALPRAZOLAM 0.5 MG PO TABS: 0.5000 mg | ORAL_TABLET | Freq: Two times a day (BID) | ORAL | Status: DC | PRN

## 2014-05-29 MED ORDER — TELMISARTAN-HCTZ 80-12.5 MG PO TABS: ORAL_TABLET | ORAL | Status: DC

## 2014-05-29 NOTE — Telephone Encounter (Signed)
[name] FAXED

## 2014-05-29 NOTE — Telephone Encounter (Signed)
Patient is needing a refill on her Xanax and Blood Pressure medication. She would like to pick these up this afternoon.

## 2014-06-04 ENCOUNTER — Ambulatory Visit (INDEPENDENT_AMBULATORY_CARE_PROVIDER_SITE_OTHER): Payer: 59 | Admitting: Emergency Medicine

## 2014-06-04 VITALS — BP 126/84 | HR 113 | Temp 98.9°F | Resp 18 | Ht 63.0 in | Wt 302.0 lb

## 2014-06-04 DIAGNOSIS — F411 Generalized anxiety disorder: Secondary | ICD-10-CM

## 2014-06-04 DIAGNOSIS — R0789 Other chest pain: Secondary | ICD-10-CM

## 2014-06-04 NOTE — Progress Notes (Signed)
   Subjective:    Patient ID: [name], female    DOB: 1971-05-03, 43 y.o.   MRN: [id number]  HPI 43 year old female presents to [facility] with stress, anxiety, chest pain, and shortness of breath  Feels anxiety and stress due to work situation [employer] was a stressful day at place of employment.  Misunderstanding between her and fellow employee Manager states she has anger issues and needs counseling  Patient does not want to go to counseling.  She feels like things will be brought up from past and feels uncomfortable with counseling.  Fellow employee sent text that she didn't want to live and this is very bothersome to patient, worried her all night    Review of Systems     Objective:   Physical Exam  Constitutional:  Patient is tearful distraught but in no distress.  HENT:  Head: Normocephalic.  Eyes: Pupils are equal, round, and reactive to light.  Neck: Neck supple. No thyromegaly present.  Cardiovascular:  There is a rapid heart rate which is regular.  Pulmonary/Chest: Effort normal and breath sounds normal.  Abdominal: Soft. Bowel sounds are normal.  Musculoskeletal:  There is no calf tenderness.   EKG  No acute changes. One PAC noted.       Assessment & Plan:  Advise patient she consider counseling. Suggested she might do better with a web nor. She is currently resistant to this. She has medications to take at home for stress. She was given a note for work today. I advised her to continue to work with [place]  regarding issues at work.

## 2014-08-05 ENCOUNTER — Ambulatory Visit (INDEPENDENT_AMBULATORY_CARE_PROVIDER_SITE_OTHER): Payer: 59 | Admitting: Emergency Medicine

## 2014-08-05 VITALS — BP 136/96 | HR 85 | Temp 98.6°F | Resp 16 | Ht 62.5 in

## 2014-08-05 DIAGNOSIS — M25562 Pain in left knee: Secondary | ICD-10-CM

## 2014-08-05 DIAGNOSIS — M25569 Pain in unspecified knee: Secondary | ICD-10-CM

## 2014-08-05 MED ORDER — MELOXICAM 15 MG PO TABS: 15.0000 mg | ORAL_TABLET | Freq: Every day | ORAL | Status: DC

## 2014-08-05 NOTE — Progress Notes (Signed)
   Subjective:    Patient ID: [name], female    DOB: [birth year], 43 y.o.   MRN: [id number]  HPI patient has pain in the medial portion of her knee. She had an injury about a year and a half ago. She has had 2 sets of x-rays of the knee and one injection. She continues to have intermittent pain in the medial portion of the knee. She has some catching in the knee but no true locking. The knee has not 1    Review of Systems     Objective:   Physical Exam There is tenderness over the medial proximal tibia. This is in the area of the anserine bursa. She is also tender in her along the medial cartilage. There is tenderness along the lower attachment of the medial collateral ligament. There is mild discomfort with McMurray testing anterior drawer sign is negative. There is also an area of tenderness medial right lower leg over the tibia       Assessment & Plan:  Patient has a symptomatic plica of the medial left knee. She will treat this with ice and a two-week course of meloxicam. She has been taking large doses of ibuprofen and Tylenol. I told her she could not take the ibuprofen while taking the meloxicam. I think advised patient to have x-ray of the tibia which will be done sometime in the next month.

## 2014-08-21 ENCOUNTER — Encounter: Payer: Self-pay | Admitting: Family Medicine

## 2014-08-21 ENCOUNTER — Ambulatory Visit (INDEPENDENT_AMBULATORY_CARE_PROVIDER_SITE_OTHER): Payer: 59 | Admitting: Family Medicine

## 2014-08-21 VITALS — BP 159/102 | HR 105 | Temp 98.0°F | Resp 16 | Ht 63.5 in | Wt 305.0 lb

## 2014-08-21 DIAGNOSIS — M25473 Effusion, unspecified ankle: Secondary | ICD-10-CM

## 2014-08-21 DIAGNOSIS — R609 Edema, unspecified: Secondary | ICD-10-CM

## 2014-08-21 DIAGNOSIS — R739 Hyperglycemia, unspecified: Secondary | ICD-10-CM

## 2014-08-21 DIAGNOSIS — I10 Essential (primary) hypertension: Secondary | ICD-10-CM

## 2014-08-21 LAB — BASIC METABOLIC PANEL
BUN: 7 mg/dL (ref 6–23)
CHLORIDE: 106 meq/L (ref 96–112)
CO2: 24 meq/L (ref 19–32)
Calcium: 9.8 mg/dL (ref 8.4–10.5)
Creat: 0.61 mg/dL (ref 0.50–1.10)
GLUCOSE: 92 mg/dL (ref 70–99)
Potassium: 4.2 mEq/L (ref 3.5–5.3)
Sodium: 139 mEq/L (ref 135–145)

## 2014-08-21 LAB — POCT GLYCOSYLATED HEMOGLOBIN (HGB A1C): HEMOGLOBIN A1C: 5.9

## 2014-08-21 LAB — GLUCOSE, POCT (MANUAL RESULT ENTRY): POC Glucose: 101 mg/dl — AB (ref 70–99)

## 2014-08-21 MED ORDER — FUROSEMIDE 20 MG PO TABS: ORAL_TABLET | ORAL | Status: DC

## 2014-08-21 NOTE — Progress Notes (Signed)
S:  This 43 y.o. AA female has HTN, is compliant w/ medication and has concerns about bilateral ankle swelling, onset a few days ago. She notices that at the end of the day, her lower legs are swollen; they are not painful or discolored. She denies muscle cramping. Ankles appear normal the next morning. She sleeps well, using CPAP and denies orthopnea. She has a hx of anemia but no recurrence since GYN procedure. Nutrition- no added salt, limits breads/starches, does consume excess Kool-Aid and sweet tea.She does report some polyuria.  Pt is taking Meloxicam prescribed by Dr. Everlene Farrier for leg pain. She is concerned that the swelling may be a side effect of this med or that she is developing CHF.  Patient Active Problem List   Diagnosis Date Noted  . Cough 03/19/2013  . HTN (hypertension) 01/14/2012  . Anemia 01/14/2012  . Sickle cell trait 01/14/2012    Prior to Admission medications   Medication Sig Start Date End Date Taking? Authorizing Provider  ALPRAZolam Duanne Moron) 0.5 MG tablet Take 1 tablet (0.5 mg total) by mouth 2 (two) times daily as needed for anxiety. Take up to two tablets daily for anxiety 05/29/14  Yes Chelle S Jeffery, PA-C  meclizine (ANTIVERT) 25 MG tablet Take 1 tablet (25 mg total) by mouth 3 (three) times daily as needed for dizziness. 04/22/14  Yes Mancel Bale, PA-C  meloxicam (MOBIC) 15 MG tablet Take 1 tablet (15 mg total) by mouth daily. 08/05/14  Yes [name], MD  rizatriptan (MAXALT) 10 MG tablet Take 1 tablet (10 mg total) by mouth as needed for migraine. May repeat in 2 hours if needed 04/22/14  Yes Mancel Bale, PA-C  telmisartan-hydrochlorothiazide (MICARDIS HCT) 80-12.5 MG per tablet One daily 05/29/14   Chelle S Jeffery, PA-C     ROS: negative for fatigue, fever, CP or tightness, palpitations, SOB or DOE, cough, GI problems, decreased urination, HA, dizziness, lightheadedness, weakness or syncope. Positive for pelvic discomfort and irreg menses; + sexual activity  but denies pregnancy.   O: Filed Vitals:   08/21/14 1356  BP: 159/102  Pulse: 105  Temp: 98 F (36.7 C)  Resp: 16   GEN: In NAD: WN,WD. COR: RRR. LUNGS: Normal resp rate and effort.  MS: MAEs; no deformities. Trace pre-tibial edema. No calf tenderness or cord; Homan's negative. NEURO: A&O x 3; CNs intact. Nonfocal.   Results for orders placed in visit on 08/21/14  POCT GLYCOSYLATED HEMOGLOBIN (HGB A1C)      Result Value Ref Range   Hemoglobin A1C 5.9    GLUCOSE, POCT (MANUAL RESULT ENTRY)      Result Value Ref Range   POC Glucose 101 (*) 70 - 99 mg/dl     A/P: Ankle edema - Dependent edema- compression hose. Elevate legs at home. Try to get up and move around more at work.  Trial Lasix 20 mg 1 tab every AM x 3 days then 1/2 tab every AM if needed. Plan: Basic metabolic panel, Vitamin D, 25-hydroxy, POCT glucose (manual entry)  Hyperglycemia - Impaired glucose tolerance; at risk for DM. Institute healthy nutrition and lifestyle changes. Plan: POCT HgB G8Z, Basic metabolic panel, POCT glucose (manual entry)  Essential hypertension- Continue current medication. Focus on weight loss.

## 2014-08-21 NOTE — Patient Instructions (Addendum)
Edema Edema is an abnormal buildup of fluids. It is more common in your legs and thighs. Painless swelling of the feet and ankles is more likely as a person ages. It also is common in looser skin, like around your eyes. HOME CARE   Keep the affected body part above the level of the heart while lying down.  Do not sit still or stand for a long time.  Do not put anything right under your knees when you lie down.  Do not wear tight clothes on your upper legs.  Exercise your legs to help the puffiness (swelling) go down.  Wear elastic bandages or support stockings as told by your doctor.  A low-salt diet may help lessen the puffiness.  Only take medicine as told by your doctor. GET HELP IF:  Treatment is not working.  You have heart, liver, or kidney disease and notice that your skin looks puffy or shiny.  You have puffiness in your legs that does not get better when you raise your legs.  You have sudden weight gain for no reason. GET HELP RIGHT AWAY IF:   You have shortness of breath or chest pain.  You cannot breathe when you lie down.  You have pain, redness, or warmth in the areas that are puffy.  You have heart, liver, or kidney disease and get edema all of a sudden.  You have a fever and your symptoms get worse all of a sudden. MAKE SURE YOU:   Understand these instructions.  Will watch your condition.  Will get help right away if you are not doing well or get worse. Document Released: 04/18/2008 Document Revised: 11/05/2013 Document Reviewed: 08/23/2013 [facility] Patient Information 2015 [place], [place]. This information is not intended to replace advice given to you by your health care provider. Make sure you discuss any questions you have with your health care provider.     Mediterranean Diet  Why follow it? Research shows.   Those who follow the Mediterranean diet have a reduced risk of heart disease    The diet is associated with a reduced incidence  of Parkinson's and Alzheimer's diseases   People following the diet may have longer life expectancies and lower rates of chronic diseases    The Dietary Guidelines for Americans recommends the Mediterranean diet as an eating plan to promote health and prevent disease  What Is the Mediterranean Diet?    Healthy eating plan based on typical foods and recipes of Mediterranean-style cooking   The diet is primarily a plant based diet; these foods should make up a majority of meals   Starches - Plant based foods should make up a majority of meals - They are an important sources of vitamins, minerals, energy, antioxidants, and fiber - Choose whole grains, foods high in fiber and minimally processed items  - Typical grain sources include wheat, oats, barley, corn, brown rice, bulgar, farro, millet, polenta, couscous  - Various types of beans include chickpeas, lentils, fava beans, black beans, white beans   Fruits  Veggies - Large quantities of antioxidant rich fruits & veggies; 6 or more servings  - Vegetables can be eaten raw or lightly drizzled with oil and cooked  - Vegetables common to the traditional Mediterranean Diet include: artichokes, arugula, beets, broccoli, brussel sprouts, cabbage, carrots, celery, collard greens, cucumbers, eggplant, kale, leeks, lemons, lettuce, mushrooms, okra, onions, peas, peppers, potatoes, pumpkin, radishes, rutabaga, shallots, spinach, sweet potatoes, turnips, zucchini - Fruits common to the Mediterranean Diet include: apples,  apricots, avocados, cherries, clementines, dates, figs, grapefruits, grapes, melons, nectarines, oranges, peaches, pears, pomegranates, strawberries, tangerines  Fats - Replace butter and margarine with healthy oils, such as olive oil, canola oil, and tahini  - Limit nuts to no more than a handful a day  - Nuts include walnuts, almonds, pecans, pistachios, pine nuts  - Limit or avoid candied, honey roasted or heavily salted nuts - Olives are  central to the Mediterranean diet - can be eaten whole or used in a variety of dishes   Meats Protein - Limiting red meat: no more than a few times a month - When eating red meat: choose lean cuts and keep the portion to the size of deck of cards - Eggs: approx. 0 to 4 times a week  - Fish and lean poultry: at least 2 a week  - Healthy protein sources include, chicken, [place], lean beef, lamb - Increase intake of seafood such as tuna, salmon, trout, mackerel, shrimp, scallops - Avoid or limit high fat processed meats such as sausage and bacon  Dairy - Include moderate amounts of low fat dairy products  - Focus on healthy dairy such as fat free yogurt, skim milk, low or reduced fat cheese - Limit dairy products higher in fat such as whole or 2% milk, cheese, ice cream  Alcohol - Moderate amounts of red wine is ok  - No more than 5 oz daily for women (all ages) and men older than age 70  - No more than 10 oz of wine daily for men younger than 57  Other - Limit sweets and other desserts  - Use herbs and spices instead of salt to flavor foods  - Herbs and spices common to the traditional Mediterranean Diet include: basil, bay leaves, chives, cloves, cumin, fennel, garlic, lavender, marjoram, mint, oregano, parsley, pepper, rosemary, sage, savory, sumac, tarragon, thyme   It's not just a diet, it's a lifestyle:    The Mediterranean diet includes lifestyle factors typical of those in the region    Foods, drinks and meals are best eaten with others and savored   Daily physical activity is important for overall good health   This could be strenuous exercise like running and aerobics   This could also be more leisurely activities such as walking, housework, yard-work, or taking the stairs   Moderation is the key; a balanced and healthy diet accommodates most foods and drinks   Consider portion sizes and frequency of consumption of certain foods   Meal Ideas & Options:    Breakfast:  o Whole wheat  toast or whole wheat English muffins with peanut butter & hard boiled egg o Steel cut oats topped with apples & cinnamon and skim milk  o Fresh fruit: banana, strawberries, melon, berries, peaches  o Smoothies: strawberries, bananas, greek yogurt, peanut butter o Low fat greek yogurt with blueberries and granola  o Egg white omelet with spinach and mushrooms o Breakfast couscous: whole wheat couscous, apricots, skim milk, cranberries    Sandwiches:  o Hummus and grilled vegetables (peppers, zucchini, squash) on whole wheat bread   o Grilled chicken on whole wheat pita with lettuce, tomatoes, cucumbers or tzatziki  o Tuna salad on whole wheat bread: tuna salad made with greek yogurt, olives, red peppers, capers, green onions o Garlic rosemary lamb pita: lamb sauted with garlic, rosemary, salt & pepper; add lettuce, cucumber, greek yogurt to pita - flavor with lemon juice and black pepper    Seafood:  o  Mediterranean grilled salmon, seasoned with garlic, basil, parsley, lemon juice and black pepper o Shrimp, lemon, and spinach whole-grain pasta salad made with low fat greek yogurt  o Seared scallops with lemon orzo  o Seared tuna steaks seasoned salt, pepper, coriander topped with tomato mixture of olives, tomatoes, olive oil, minced garlic, parsley, green onions and cappers    Meats:  o Herbed greek chicken salad with kalamata olives, cucumber, feta  o Red bell peppers stuffed with spinach, bulgur, lean ground beef (or lentils) & topped with feta   o Kebabs: skewers of chicken, tomatoes, onions, zucchini, squash  o [place] burgers: made with red onions, mint, dill, lemon juice, feta cheese topped with roasted red peppers   Vegetarian o Cucumber salad: cucumbers, artichoke hearts, celery, red onion, feta cheese, tossed in olive oil & lemon juice  o Hummus and whole grain pita points with a greek salad (lettuce, tomato, feta, olives, cucumbers, red onion) o Lentil soup with celery, carrots made  with vegetable broth, garlic, salt and pepper  o Tabouli salad: parsley, bulgur, mint, scallions, cucumbers, tomato, radishes, lemon juice, olive oil, salt and pepper. o

## 2014-08-22 LAB — VITAMIN D 25 HYDROXY (VIT D DEFICIENCY, FRACTURES): Vit D, 25-Hydroxy: 12 ng/mL — ABNORMAL LOW (ref 30–89)

## 2014-08-25 ENCOUNTER — Other Ambulatory Visit: Payer: Self-pay | Admitting: Family Medicine

## 2014-08-25 MED ORDER — ERGOCALCIFEROL 1.25 MG (50000 UT) PO CAPS: 50000.0000 [IU] | ORAL_CAPSULE | ORAL | Status: DC

## 2014-08-25 NOTE — Progress Notes (Signed)
Quick Note:  Please advise pt regarding following labs... Vitamin D level is below normal. I discussed treatment plan with you. You need to take prescription Vitamin D 50000 units once a week for next 6 months. Ideally, Vitamin level should be rechecked in 12 weeks. Try to eat foods rich in Vitamin D as well as getting some sun exposure most days of the week. The prescription has been sent to your pharmacy.  Copy to pt. ______

## 2014-09-01 ENCOUNTER — Encounter: Payer: Self-pay | Admitting: Emergency Medicine

## 2014-09-22 ENCOUNTER — Encounter: Payer: Self-pay | Admitting: Family Medicine

## 2014-09-22 ENCOUNTER — Ambulatory Visit (INDEPENDENT_AMBULATORY_CARE_PROVIDER_SITE_OTHER): Payer: 59

## 2014-09-22 ENCOUNTER — Ambulatory Visit (INDEPENDENT_AMBULATORY_CARE_PROVIDER_SITE_OTHER): Payer: 59 | Admitting: Family Medicine

## 2014-09-22 VITALS — BP 140/97 | HR 86 | Temp 98.8°F | Resp 16 | Ht 63.5 in | Wt 300.0 lb

## 2014-09-22 DIAGNOSIS — M25561 Pain in right knee: Secondary | ICD-10-CM

## 2014-09-22 NOTE — Progress Notes (Signed)
Subjective: Patient has been having a lot of trouble with pain in her right knee. She feels like she gets swelling in it. She has had joint injection in her left knee in the past. She thought it was in her right, but review of records reveals was in the left. She knows of no specific injury. She feels a tender area and it catches a little bit.she feels like it is related to all the time she's been standing on her feet and walking.  Objective: Joint has some pain intermittently on flexion and extension, under the right patella medially in the lower aspect of the joint. A small effusion could be palpated. Treatment was discussed with patient.explained that the risk primarily was of infecting a joint, therefore sterile technique was used. However if she gets any inflammation she is to get checked promptly.  [facility] reading (PRIMARY) by  Dr. Linna Darner Normal x-ray right knee  Procedure note:Marland Kitchen Using sterile technique the right knee was prepped. A lateral approach was used. Aspiration was attempted, but no fluid obtained.1% lidocaine was used to numb the skin. Injection of Depo-Medrol 80 and 2% lidocaine 2 mL was done. The medication went smoothly. Patient tolerated the procedure well.patient ambulated fine. She was instructed to use some ice on her knee tonight, and to try and limit her trips up and down her stairs.  Assessment: Right knee pain, long-term  Plan: We will see how the injection does. If it does not help sufficiently will refer her to Dr. Rip Harbour.

## 2014-10-16 ENCOUNTER — Inpatient Hospital Stay (HOSPITAL_COMMUNITY)
Admission: EM | Admit: 2014-10-16 | Discharge: 2014-10-22 | DRG: 059 | Disposition: A | Discharge status: Home / Self Care | Payer: 59 | Attending: Family Medicine | Admitting: Family Medicine

## 2014-10-16 ENCOUNTER — Encounter (HOSPITAL_COMMUNITY): Payer: Self-pay | Admitting: Emergency Medicine

## 2014-10-16 ENCOUNTER — Ambulatory Visit (INDEPENDENT_AMBULATORY_CARE_PROVIDER_SITE_OTHER): Payer: 59 | Admitting: Family Medicine

## 2014-10-16 ENCOUNTER — Inpatient Hospital Stay (HOSPITAL_COMMUNITY): Payer: 59

## 2014-10-16 ENCOUNTER — Emergency Department (HOSPITAL_COMMUNITY): Payer: 59

## 2014-10-16 VITALS — BP 134/90 | HR 90 | Temp 98.5°F | Resp 18 | Ht 63.0 in | Wt 293.0 lb

## 2014-10-16 DIAGNOSIS — R7303 Prediabetes: Secondary | ICD-10-CM | POA: Insufficient documentation

## 2014-10-16 DIAGNOSIS — R531 Weakness: Secondary | ICD-10-CM

## 2014-10-16 DIAGNOSIS — G8194 Hemiplegia, unspecified affecting left nondominant side: Secondary | ICD-10-CM | POA: Diagnosis present

## 2014-10-16 DIAGNOSIS — G4733 Obstructive sleep apnea (adult) (pediatric): Secondary | ICD-10-CM | POA: Diagnosis present

## 2014-10-16 DIAGNOSIS — I639 Cerebral infarction, unspecified: Secondary | ICD-10-CM | POA: Diagnosis present

## 2014-10-16 DIAGNOSIS — Z888 Allergy status to other drugs, medicaments and biological substances status: Secondary | ICD-10-CM | POA: Diagnosis not present

## 2014-10-16 DIAGNOSIS — R2689 Other abnormalities of gait and mobility: Secondary | ICD-10-CM

## 2014-10-16 DIAGNOSIS — Z881 Allergy status to other antibiotic agents status: Secondary | ICD-10-CM

## 2014-10-16 DIAGNOSIS — D649 Anemia, unspecified: Secondary | ICD-10-CM | POA: Diagnosis present

## 2014-10-16 DIAGNOSIS — R269 Unspecified abnormalities of gait and mobility: Secondary | ICD-10-CM | POA: Diagnosis present

## 2014-10-16 DIAGNOSIS — D573 Sickle-cell trait: Secondary | ICD-10-CM | POA: Diagnosis present

## 2014-10-16 DIAGNOSIS — Z6841 Body Mass Index (BMI) 40.0 and over, adult: Secondary | ICD-10-CM

## 2014-10-16 DIAGNOSIS — F329 Major depressive disorder, single episode, unspecified: Secondary | ICD-10-CM | POA: Diagnosis present

## 2014-10-16 DIAGNOSIS — E669 Obesity, unspecified: Secondary | ICD-10-CM | POA: Diagnosis present

## 2014-10-16 DIAGNOSIS — Z8249 Family history of ischemic heart disease and other diseases of the circulatory system: Secondary | ICD-10-CM | POA: Diagnosis not present

## 2014-10-16 DIAGNOSIS — Z79899 Other long term (current) drug therapy: Secondary | ICD-10-CM

## 2014-10-16 DIAGNOSIS — Z9989 Dependence on other enabling machines and devices: Secondary | ICD-10-CM

## 2014-10-16 DIAGNOSIS — F17200 Nicotine dependence, unspecified, uncomplicated: Secondary | ICD-10-CM | POA: Insufficient documentation

## 2014-10-16 DIAGNOSIS — R7309 Other abnormal glucose: Secondary | ICD-10-CM | POA: Diagnosis not present

## 2014-10-16 DIAGNOSIS — Z9049 Acquired absence of other specified parts of digestive tract: Secondary | ICD-10-CM | POA: Diagnosis present

## 2014-10-16 DIAGNOSIS — F17210 Nicotine dependence, cigarettes, uncomplicated: Secondary | ICD-10-CM | POA: Diagnosis present

## 2014-10-16 DIAGNOSIS — R000 Tachycardia, unspecified: Secondary | ICD-10-CM | POA: Insufficient documentation

## 2014-10-16 DIAGNOSIS — T380X5A Adverse effect of glucocorticoids and synthetic analogues, initial encounter: Secondary | ICD-10-CM | POA: Diagnosis not present

## 2014-10-16 DIAGNOSIS — G35 Multiple sclerosis: Principal | ICD-10-CM | POA: Insufficient documentation

## 2014-10-16 DIAGNOSIS — F419 Anxiety disorder, unspecified: Secondary | ICD-10-CM | POA: Diagnosis present

## 2014-10-16 DIAGNOSIS — Z720 Tobacco use: Secondary | ICD-10-CM

## 2014-10-16 DIAGNOSIS — R42 Dizziness and giddiness: Secondary | ICD-10-CM

## 2014-10-16 DIAGNOSIS — I10 Essential (primary) hypertension: Secondary | ICD-10-CM | POA: Diagnosis present

## 2014-10-16 LAB — DIFFERENTIAL
Basophils Absolute: 0 10*3/uL (ref 0.0–0.1)
Basophils Relative: 0 % (ref 0–1)
EOS PCT: 2 % (ref 0–5)
Eosinophils Absolute: 0.2 10*3/uL (ref 0.0–0.7)
LYMPHS ABS: 2.9 10*3/uL (ref 0.7–4.0)
LYMPHS PCT: 26 % (ref 12–46)
MONO ABS: 0.6 10*3/uL (ref 0.1–1.0)
MONOS PCT: 5 % (ref 3–12)
Neutro Abs: 7.3 10*3/uL (ref 1.7–7.7)
Neutrophils Relative %: 66 % (ref 43–77)

## 2014-10-16 LAB — COMPREHENSIVE METABOLIC PANEL
ALT: 24 U/L (ref 0–35)
ANION GAP: 11 (ref 5–15)
AST: 17 U/L (ref 0–37)
Albumin: 3.9 g/dL (ref 3.5–5.2)
Alkaline Phosphatase: 87 U/L (ref 39–117)
BUN: 8 mg/dL (ref 6–23)
CALCIUM: 10.7 mg/dL — AB (ref 8.4–10.5)
CO2: 26 meq/L (ref 19–32)
CREATININE: 0.77 mg/dL (ref 0.50–1.10)
Chloride: 102 mEq/L (ref 96–112)
GLUCOSE: 100 mg/dL — AB (ref 70–99)
Potassium: 4.1 mEq/L (ref 3.7–5.3)
Sodium: 139 mEq/L (ref 137–147)
TOTAL PROTEIN: 7.7 g/dL (ref 6.0–8.3)
Total Bilirubin: 0.2 mg/dL — ABNORMAL LOW (ref 0.3–1.2)

## 2014-10-16 LAB — GLUCOSE, POCT (MANUAL RESULT ENTRY): POC Glucose: 109 mg/dl — AB (ref 70–99)

## 2014-10-16 LAB — CBC
HEMATOCRIT: 39.3 % (ref 36.0–46.0)
Hemoglobin: 12.7 g/dL (ref 12.0–15.0)
MCH: 23.8 pg — AB (ref 26.0–34.0)
MCHC: 32.3 g/dL (ref 30.0–36.0)
MCV: 73.6 fL — AB (ref 78.0–100.0)
PLATELETS: 280 10*3/uL (ref 150–400)
RBC: 5.34 MIL/uL — AB (ref 3.87–5.11)
RDW: 16.6 % — ABNORMAL HIGH (ref 11.5–15.5)
WBC: 11 10*3/uL — AB (ref 4.0–10.5)

## 2014-10-16 LAB — POCT CBC
GRANULOCYTE PERCENT: 70 % (ref 37–80)
HCT, POC: 40.2 % (ref 37.7–47.9)
Hemoglobin: 12.4 g/dL (ref 12.2–16.2)
Lymph, poc: 2.8 (ref 0.6–3.4)
MCH, POC: 23.5 pg — AB (ref 27–31.2)
MCHC: 30.8 g/dL — AB (ref 31.8–35.4)
MCV: 76.1 fL — AB (ref 80–97)
MID (CBC): 0.4 (ref 0–0.9)
MPV: 10.6 fL (ref 0–99.8)
PLATELET COUNT, POC: 258 10*3/uL (ref 142–424)
POC Granulocyte: 7.6 — AB (ref 2–6.9)
POC LYMPH PERCENT: 26 %L (ref 10–50)
POC MID %: 4 % (ref 0–12)
RBC: 5.29 M/uL (ref 4.04–5.48)
RDW, POC: 18.5 %
WBC: 10.9 10*3/uL — AB (ref 4.6–10.2)

## 2014-10-16 LAB — I-STAT CHEM 8, ED
BUN: 7 mg/dL (ref 6–23)
CALCIUM ION: 1.39 mmol/L — AB (ref 1.12–1.23)
Chloride: 102 mEq/L (ref 96–112)
Creatinine, Ser: 0.9 mg/dL (ref 0.50–1.10)
GLUCOSE: 102 mg/dL — AB (ref 70–99)
HCT: 42 % (ref 36.0–46.0)
Hemoglobin: 14.3 g/dL (ref 12.0–15.0)
Potassium: 3.9 mEq/L (ref 3.7–5.3)
Sodium: 140 mEq/L (ref 137–147)
TCO2: 26 mmol/L (ref 0–100)

## 2014-10-16 LAB — PROTIME-INR
INR: 0.94 (ref 0.00–1.49)
Prothrombin Time: 12.6 seconds (ref 11.6–15.2)

## 2014-10-16 LAB — I-STAT TROPONIN, ED: TROPONIN I, POC: 0 ng/mL (ref 0.00–0.08)

## 2014-10-16 LAB — APTT: aPTT: 29 seconds (ref 24–37)

## 2014-10-16 MED ORDER — ASPIRIN 325 MG PO TABS
325.0000 mg | ORAL_TABLET | Freq: Every day | ORAL | Status: DC
  Administered 2014-10-17 – 2014-10-22 (×6): 325 mg via ORAL
  Filled 2014-10-16 (×6): qty 1

## 2014-10-16 MED ORDER — TELMISARTAN-HCTZ 80-12.5 MG PO TABS: 1.0000 | ORAL_TABLET | Freq: Every day | ORAL | Status: DC

## 2014-10-16 MED ORDER — ATORVASTATIN CALCIUM 40 MG PO TABS
40.0000 mg | ORAL_TABLET | Freq: Every day | ORAL | Status: DC
  Administered 2014-10-16 – 2014-10-21 (×6): 40 mg via ORAL
  Filled 2014-10-16 (×6): qty 1

## 2014-10-16 MED ORDER — SENNOSIDES-DOCUSATE SODIUM 8.6-50 MG PO TABS: 1.0000 | ORAL_TABLET | Freq: Every evening | ORAL | Status: DC | PRN

## 2014-10-16 MED ORDER — SODIUM CHLORIDE 0.9 % IV SOLN
INTRAVENOUS | Status: DC
  Administered 2014-10-16: 23:00:00 via INTRAVENOUS

## 2014-10-16 MED ORDER — ASPIRIN 81 MG PO CHEW
324.0000 mg | CHEWABLE_TABLET | Freq: Once | ORAL | Status: AC
  Administered 2014-10-16: 324 mg via ORAL
  Filled 2014-10-16: qty 4

## 2014-10-16 MED ORDER — ASPIRIN 300 MG RE SUPP: 300.0000 mg | Freq: Every day | RECTAL | Status: DC

## 2014-10-16 MED ORDER — ENOXAPARIN SODIUM 40 MG/0.4ML ~~LOC~~ SOLN
40.0000 mg | SUBCUTANEOUS | Status: DC
  Administered 2014-10-16 – 2014-10-19 (×4): 40 mg via SUBCUTANEOUS
  Filled 2014-10-16 (×4): qty 0.4

## 2014-10-16 MED ORDER — STROKE: EARLY STAGES OF RECOVERY BOOK
Freq: Once | Status: AC
  Administered 2014-10-16: 23:00:00
  Filled 2014-10-16: qty 1

## 2014-10-16 MED ORDER — NICOTINE 7 MG/24HR TD PT24
7.0000 mg | MEDICATED_PATCH | TRANSDERMAL | Status: DC
  Filled 2014-10-16 (×4): qty 1

## 2014-10-16 MED ORDER — IRBESARTAN 300 MG PO TABS
300.0000 mg | ORAL_TABLET | Freq: Every day | ORAL | Status: DC
  Administered 2014-10-17 – 2014-10-22 (×6): 300 mg via ORAL
  Filled 2014-10-16 (×3): qty 1
  Filled 2014-10-16: qty 2
  Filled 2014-10-16 (×2): qty 1

## 2014-10-16 MED ORDER — HYDROCHLOROTHIAZIDE 12.5 MG PO CAPS
12.5000 mg | ORAL_CAPSULE | Freq: Every day | ORAL | Status: DC
  Administered 2014-10-17 – 2014-10-22 (×6): 12.5 mg via ORAL
  Filled 2014-10-16 (×6): qty 1

## 2014-10-16 NOTE — Progress Notes (Signed)
Pt still in MRI 

## 2014-10-16 NOTE — Consult Note (Signed)
Stroke Consult    Chief Complaint: left sided weakness HPI: [name] is an 43 y.o. female with history of hypertension, obesity, and tobacco use. She presents today with complaints of left arm and leg weakness. She states her symptoms began last night. Notes paresthesias and decreased dexterity in left hand. Her left leg feels heavy and she felt like falling to the left when walking.   Date last known well: 10/15/2014 Time last known well: 2100 tPA Given: no, outside therapeutic window  Past Medical History  Diagnosis Date  . Hypertension   . Smoker   . Allergy   . Anemia   . Depression   . Anxiety   . Sickle cell trait   . Iron overload, transfusional   . Sickle cell anemia     trait  . Clotting disorder     from beeing anemic    Past Surgical History  Procedure Laterality Date  . Cervical ablation    . Tubal ligation    . Cholecystectomy    . Cesarean section      3 times    Family History  Problem Relation Age of Onset  . Mental illness Mother   . Hyperlipidemia Mother   . Hyperthyroidism Mother   . Mental retardation Mother   . ADD / ADHD Son   . Diabetes Father   . Cancer Father   . Cancer Paternal Aunt     breast  . Arthritis Maternal Grandmother   . Diabetes Maternal Grandmother   . Hearing loss Maternal Grandmother     left hear when she was a child  . Hyperlipidemia Maternal Grandmother   . Hypertension Maternal Grandmother   . Alcohol abuse Maternal Grandfather   . Early death Maternal Grandfather   . Cancer Paternal Aunt     breast   Social History:  reports that she has been smoking Cigarettes.  She has been smoking about 0.00 packs per day for the past 20 years. She has never used smokeless tobacco. She reports that she drinks alcohol. She reports that she does not use illicit drugs.  Allergies:  Allergies  Allergen Reactions  . Iron Shortness Of Breath    IV only   . Delsym [Dextromethorphan Polistirex Er]     nightmares  .  Doxycycline     Bad taste to patient     (Not in a hospital admission)  ROS: Out of a complete 14 system review, the patient complains of only the following symptoms, and all other reviewed systems are negative.  CT head imaging reviewed shows possible infarcts in right frontal and right corona radiata  Physical Examination: Filed Vitals:   10/16/14 1545  BP: 158/106  Pulse: 103  Temp: 98.5 F (36.9 C)  Resp: 17   Physical Exam  Constitutional: He appears well-developed and well-nourished.  Psych: Affect appropriate to situation Eyes: No scleral injection HENT: No OP obstrucion Head: Normocephalic.  Cardiovascular: Normal rate and regular rhythm.  Respiratory: Effort normal and breath sounds normal.  GI: Soft. Bowel sounds are normal. No distension. There is no tenderness.  Skin: WDI   Neurologic Examination: Mental Status: Alert, oriented, thought content appropriate.  Speech fluent without evidence of aphasia.  Able to follow 3 step commands without difficulty. Cranial Nerves: II: funduscopic exam wnl bilaterally, visual fields grossly normal, pupils equal, round, reactive to light and accommodation III,IV, VI: ptosis not present, extra-ocular motions intact bilaterally V,VII: smile symmetric, facial light touch sensation normal bilaterally VIII: hearing normal  bilaterally IX,X: gag reflex present XI: trapezius strength/neck flexion strength normal bilaterally XII: tongue strength normal  Motor: 5/5 strength on the right side UE and LE. 5-/5 proximal and distal LUE. 5-/5 proximal LLE to 5/5 distal Tone and bulk:normal tone throughout; no atrophy noted Sensory: Pinprick and light touch intact throughout, bilaterally Deep Tendon Reflexes: 2+ and symmetric throughout Plantars: Right: downgoing   Left: downgoing Cerebellar: normal finger-to-nose, normal rapid alternating movements and normal heel-to-shin test Gait: deferred due to multiple leads in the  ED  Laboratory Studies:   Basic Metabolic Panel:  Recent Labs Lab 10/16/14 1557 10/16/14 1619  NA 139 140  K 4.1 3.9  CL 102 102  CO2 26  --   GLUCOSE 100* 102*  BUN 8 7  CREATININE 0.77 0.90  CALCIUM 10.7*  --     Liver Function Tests:  Recent Labs Lab 10/16/14 1557  AST 17  ALT 24  ALKPHOS 87  BILITOT 0.2*  PROT 7.7  ALBUMIN 3.9   No results for input(s): LIPASE, AMYLASE in the last 168 hours. No results for input(s): AMMONIA in the last 168 hours.  CBC:  Recent Labs Lab 10/16/14 1417 10/16/14 1557 10/16/14 1619  WBC 10.9* 11.0*  --   NEUTROABS  --  7.3  --   HGB 12.4 12.7 14.3  HCT 40.2 39.3 42.0  MCV 76.1* 73.6*  --   PLT  --  280  --     Cardiac Enzymes: No results for input(s): CKTOTAL, CKMB, CKMBINDEX, TROPONINI in the last 168 hours.  BNP: Invalid input(s): POCBNP  CBG: No results for input(s): GLUCAP in the last 168 hours.  Microbiology: Results for orders placed or performed during the hospital encounter of 07/05/09  Technologist smear review     Status: None   Collection Time: 07/05/09  3:15 AM  Result Value Ref Range Status   Path Review   Final    POLYCHROMASIA PRESENT LARGE PLATELETS PRESENT TARGET CELLS SPHEROCYTES    Coagulation Studies:  Recent Labs  10/16/14 1557  LABPROT 12.6  INR 0.94    Urinalysis: No results for input(s): COLORURINE, LABSPEC, PHURINE, GLUCOSEU, HGBUR, BILIRUBINUR, KETONESUR, PROTEINUR, UROBILINOGEN, NITRITE, LEUKOCYTESUR in the last 168 hours.  Invalid input(s): APPERANCEUR  Lipid Panel:     Component Value Date/Time   CHOL 177 12/12/2013 0810   TRIG 112 12/12/2013 0810   HDL 42 12/12/2013 0810   CHOLHDL 4.2 12/12/2013 0810   VLDL 22 12/12/2013 0810   LDLCALC 113* 12/12/2013 0810    HgbA1C:  Lab Results  Component Value Date   HGBA1C 5.9 08/21/2014    Urine Drug Screen:  No results found for: LABOPIA, COCAINSCRNUR, LABBENZ, AMPHETMU, THCU, LABBARB  Alcohol Level: No results for  input(s): ETH in the last 168 hours.  Other results: EKG: sinus tachycardia  Imaging: Ct Head (brain) Wo Contrast  10/16/2014   CLINICAL DATA:  Left-sided weakness for 2 days  EXAM: CT HEAD WITHOUT CONTRAST  TECHNIQUE: Contiguous axial images were obtained from the base of the skull through the vertex without intravenous contrast.  COMPARISON:  07/30/2009  FINDINGS: There is no evidence of mass effect, midline shift or extra-axial fluid collections. There is no evidence of a space-occupying lesion or intracranial hemorrhage. There is a small area of low attenuation in the right frontal lobe and in the right corona radiata.  The ventricles and sulci are appropriate for the patient's age. The basal cisterns are patent.  Visualized portions of the orbits are unremarkable.  The visualized portions of the paranasal sinuses and mastoid air cells are unremarkable.  The osseous structures are unremarkable.  IMPRESSION: 1. Small areas of low attenuation in the right frontal lobe and in the right corona radiata concerning for acute versus subacute nonhemorrhagic infarcts. These results were called by telephone at the time of interpretation on 10/16/2014 at 4:44 pm to DR. DELO, who verbally acknowledged these results.   Electronically Signed   By: Kathreen Devoid   On: 10/16/2014 16:45    Assessment: 43 y.o. female with hx of HTN, obesity, tobacco usage p/w acute onset left sided weakness. CT head shows possible acute infarcts in right frontal and right corona radiata. Suspect small vessel though 2 simultaneous lesions raises concern for possible embolic event. Will admit for stroke workup.   Stroke Risk Factors - HTN, tobacco usage  Plan: 1. HgbA1c, fasting lipid panel 2. MRI, MRA  of the brain without contrast 3. PT consult, OT consult, Speech consult 4. Echocardiogram 5. Carotid dopplers 6. Prophylactic therapy-ASA 325mg  7. Risk factor modification 8. Telemetry monitoring 9. Frequent neuro checks 10. NPO  until RN stroke swallow screen    Jim Like, DO [facility] [phone]  If 7pm- 7am, please page neurology on call as listed in [place]. 10/16/2014, 5:59 PM

## 2014-10-16 NOTE — Progress Notes (Signed)
Visited patients room to place on cpap upon arrival I noticed patient had her own cpap in a bag beside her bed.  I advised patient that we would have to have it checked out through [employer].  Rn now made aware and is to contact for approval to use and then notify the RT to help plug up and add H20.

## 2014-10-16 NOTE — ED Provider Notes (Signed)
CSN: [id number]     Arrival date & time 10/16/14  1520 History   First MD Initiated Contact with Patient 10/16/14 1734     Chief Complaint  Patient presents with  . Weakness     (Consider location/radiation/quality/duration/timing/severity/associated sxs/prior Treatment) HPI Comments: Patient is a 43 year old female with history of diabetes, hypertension, obesity, and tobacco use. She presents today with complaints of left arm and leg weakness. She states her symptoms began last night. When she attempted to walk she called her she was falling to one side.  Patient is a 43 y.o. female presenting with weakness. The history is provided by the patient.  Weakness This is a new problem. The current episode started yesterday. The problem occurs constantly. The problem has not changed since onset.Pertinent negatives include no chest pain, no abdominal pain, no headaches and no shortness of breath. Nothing aggravates the symptoms. Nothing relieves the symptoms. She has tried nothing for the symptoms. The treatment provided no relief.    Past Medical History  Diagnosis Date  . Hypertension   . Smoker   . Allergy   . Anemia   . Depression   . Anxiety   . Sickle cell trait   . Iron overload, transfusional   . Sickle cell anemia     trait  . Clotting disorder     from beeing anemic   Past Surgical History  Procedure Laterality Date  . Cervical ablation    . Tubal ligation    . Cholecystectomy    . Cesarean section      3 times   Family History  Problem Relation Age of Onset  . Mental illness Mother   . Hyperlipidemia Mother   . Hyperthyroidism Mother   . Mental retardation Mother   . ADD / ADHD Son   . Diabetes Father   . Cancer Father   . Cancer Paternal Aunt     breast  . Arthritis Maternal Grandmother   . Diabetes Maternal Grandmother   . Hearing loss Maternal Grandmother     left hear when she was a child  . Hyperlipidemia Maternal Grandmother   . Hypertension Maternal  Grandmother   . Alcohol abuse Maternal Grandfather   . Early death Maternal Grandfather   . Cancer Paternal Aunt     breast   History  Substance Use Topics  . Smoking status: Current Every Day Smoker -- 20 years    Types: Cigarettes  . Smokeless tobacco: Never Used     Comment: smoke 5 cigarettes/day or prn  . Alcohol Use: Yes     Comment: once maybe a month if that   OB History    No data available     Review of Systems  Respiratory: Negative for shortness of breath.   Cardiovascular: Negative for chest pain.  Gastrointestinal: Negative for abdominal pain.  Neurological: Positive for weakness. Negative for headaches.  All other systems reviewed and are negative.     Allergies  Iron; Delsym; and Doxycycline  Home Medications   Prior to Admission medications   Medication Sig Start Date End Date Taking? Authorizing Provider  ALPRAZolam Duanne Moron) 0.5 MG tablet Take 1 tablet (0.5 mg total) by mouth 2 (two) times daily as needed for anxiety. Take up to two tablets daily for anxiety 05/29/14   Chelle S Jeffery, PA-C  ergocalciferol (DRISDOL) 50000 UNITS capsule Take 1 capsule (50,000 Units total) by mouth once a week. 08/25/14 08/25/15  Barton Fanny, MD  rizatriptan (MAXALT) 10 MG  tablet Take 1 tablet (10 mg total) by mouth as needed for migraine. May repeat in 2 hours if needed 04/22/14   Mancel Bale, PA-C  telmisartan-hydrochlorothiazide (MICARDIS HCT) 80-12.5 MG per tablet One daily 05/29/14   Chelle S Jeffery, PA-C   BP 158/106 mmHg  Pulse 103  Temp(Src) 98.5 F (36.9 C)  Resp 17  Ht 5\' 3"  (1.6 m)  Wt 296 lb (134.265 kg)  BMI 52.45 kg/m2  SpO2 96%  LMP 09/04/2014 Physical Exam  Constitutional: She is oriented to person, place, and time. She appears well-developed and well-nourished. No distress.  HENT:  Head: Normocephalic and atraumatic.  Mouth/Throat: Oropharynx is clear and moist.  Eyes: EOM are normal. Pupils are equal, round, and reactive to light.   Neck: Normal range of motion. Neck supple.  Cardiovascular: Normal rate and regular rhythm.  Exam reveals no gallop and no friction rub.   No murmur heard. Pulmonary/Chest: Effort normal and breath sounds normal. No respiratory distress. She has no wheezes.  Abdominal: Soft. Bowel sounds are normal. She exhibits no distension. There is no tenderness.  Musculoskeletal: Normal range of motion.  Neurological: She is alert and oriented to person, place, and time. No cranial nerve deficit. She exhibits normal muscle tone. Coordination normal.  Skin: Skin is warm and dry. She is not diaphoretic.  Nursing note and vitals reviewed.   ED Course  Procedures (including critical care time) Labs Review Labs Reviewed  CBC - Abnormal; Notable for the following:    WBC 11.0 (*)    RBC 5.34 (*)    MCV 73.6 (*)    MCH 23.8 (*)    RDW 16.6 (*)    All other components within normal limits  COMPREHENSIVE METABOLIC PANEL - Abnormal; Notable for the following:    Glucose, Bld 100 (*)    Calcium 10.7 (*)    Total Bilirubin 0.2 (*)    All other components within normal limits  I-STAT CHEM 8, ED - Abnormal; Notable for the following:    Glucose, Bld 102 (*)    Calcium, Ion 1.39 (*)    All other components within normal limits  PROTIME-INR  APTT  DIFFERENTIAL  I-STAT TROPOININ, ED  CBG MONITORING, ED    Imaging Review Ct Head (brain) Wo Contrast  10/16/2014   CLINICAL DATA:  Left-sided weakness for 2 days  EXAM: CT HEAD WITHOUT CONTRAST  TECHNIQUE: Contiguous axial images were obtained from the base of the skull through the vertex without intravenous contrast.  COMPARISON:  07/30/2009  FINDINGS: There is no evidence of mass effect, midline shift or extra-axial fluid collections. There is no evidence of a space-occupying lesion or intracranial hemorrhage. There is a small area of low attenuation in the right frontal lobe and in the right corona radiata.  The ventricles and sulci are appropriate for the  patient's age. The basal cisterns are patent.  Visualized portions of the orbits are unremarkable. The visualized portions of the paranasal sinuses and mastoid air cells are unremarkable.  The osseous structures are unremarkable.  IMPRESSION: 1. Small areas of low attenuation in the right frontal lobe and in the right corona radiata concerning for acute versus subacute nonhemorrhagic infarcts. These results were called by telephone at the time of interpretation on 10/16/2014 at 4:44 pm to DR. Dynastee Brummell, who verbally acknowledged these results.   Electronically Signed   By: Kathreen Devoid   On: 10/16/2014 16:45     EKG Interpretation   Date/Time:  Thursday October 16 2014 15:48:16 EST Ventricular Rate:  106 PR Interval:  128 QRS Duration: 76 QT Interval:  328 QTC Calculation: 435 R Axis:   12 Text Interpretation:  Sinus tachycardia Otherwise normal ECG Confirmed by  DELOS  MD, Lerry Cordrey ([id number]) on 10/16/2014 5:55:50 PM      MDM   Final diagnoses:  None    Workup reveals findings consistent with an acute CVA on her head CT. I've discussed these results with neurology who recommends admission to medicine. I've spoken with the family practice service who will admit the patient for workup of CVA.    Veryl Speak, MD 10/17/14 [phone]

## 2014-10-16 NOTE — ED Notes (Signed)
Pt c/o left sided weakness x 2 days; pt moves extremity well but difficulty ambulating

## 2014-10-16 NOTE — Progress Notes (Signed)
Subjective:    Patient ID: [name], female    DOB: [birth year], 43 y.o.   MRN: [id number]  HPI  Pt presents to clinic with sensation of the left side of her body feeling heavy and not working correctly since last night around 10pm.  She does not feel like it has gotten better or worse since it started.  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  She started with feeling unsteady when she stood up and felt like she was losing her balance but she never feel.  She feels like her arm and leg on the left are weak and she is having trouble walking because they are heavy.  She is having NO pain not in her arms, legs, back, neck, head or chest.  She is having no SOB.  She sleeps with her CPAP.  She is under her normal amount of stress.  She is not having no trouble speaking or forming words or confusion.  She is having no facial symptoms.  She is not dizzy and does not have vertigo. She has only taken her HTN medications today.  She feels good.  No family h/o strokes or MI or blood clotting or clotting disorders.  She smokes.    Prior to Admission medications   Medication Sig Start Date End Date Taking? Authorizing Provider  ALPRAZolam Duanne Moron) 0.5 MG tablet Take 1 tablet (0.5 mg total) by mouth 2 (two) times daily as needed for anxiety. Take up to two tablets daily for anxiety 05/29/14  Yes Chelle S Jeffery, PA-C  ergocalciferol (DRISDOL) 50000 UNITS capsule Take 1 capsule (50,000 Units total) by mouth once a week. 08/25/14 08/25/15 Yes Barton Fanny, MD  rizatriptan (MAXALT) 10 MG tablet Take 1 tablet (10 mg total) by mouth as needed for migraine. May repeat in 2 hours if needed 04/22/14  Yes Mancel Bale, PA-C  telmisartan-hydrochlorothiazide (MICARDIS HCT) 80-12.5 MG per tablet One daily 05/29/14  Yes Chelle Janalee Dane, PA-C       Barton Fanny, MD   Allergies  Allergen Reactions  . Iron Shortness Of Breath      IV only   . Delsym [Dextromethorphan Polistirex Er]     nightmares  . Doxycycline     Bad taste to patient   Patient Active Problem List   Diagnosis Date Noted  . Obesity, Class III, BMI 40-49.9 (morbid obesity) 10/16/2014  . Smoker 10/16/2014  . Cough 03/19/2013  . HTN (hypertension) 01/14/2012  . Anemia 01/14/2012  . Sickle cell trait 01/14/2012      Review of Systems  Constitutional: Negative for fever, chills, diaphoresis and fatigue.  Cardiovascular: Negative for chest pain, palpitations and leg swelling.  Neurological: Positive for weakness (left arm and leg). Negative for dizziness, syncope, facial asymmetry, speech difficulty, light-headedness, numbness and headaches.       Objective:   Physical Exam  Constitutional: She is oriented to person, place, and time. She appears well-developed and well-nourished.  BP 134/90 mmHg  Pulse 90  Temp(Src) 98.5 F (36.9 C)  Resp 18  Ht 5\' 3"  (1.6 m)  Wt 293 lb (132.904 kg)  BMI 51.92 kg/m2  SpO2 100%  LMP 09/04/2014   HENT:  Head: Normocephalic and atraumatic.  Right Ear: External ear normal.  Left Ear: External ear normal.  Eyes: Conjunctivae, EOM and lids are normal. Pupils are equal, round, and reactive to light.  Neck: Normal range of motion. Neck supple.  Cardiovascular: Normal rate, regular rhythm and normal heart sounds.   No murmur heard. Pulmonary/Chest: Effort normal and breath sounds normal. She has no wheezes.  Musculoskeletal: Normal range of motion.  Lymphadenopathy:    She has no cervical adenopathy.  Neurological: She is alert and oriented to person, place, and time. She has normal reflexes. No cranial nerve deficit or sensory deficit. Coordination (falls to the left with rhomberg testing - with finger to nose right side normal left side slower and deviates esp as she gets further from her nose and towards my finger but she does always get from her nose to my finger) and gait (purposeful walking with  left leg - appears to be picking it up to move it) abnormal. She displays no Babinski's sign on the right side. She displays no Babinski's sign on the left side.  Reflex Scores:      Tricep reflexes are 2+ on the right side and 2+ on the left side.      Bicep reflexes are 2+ on the right side and 2+ on the left side.      Achilles reflexes are 2+ on the right side and 2+ on the left side. Strength slightly less on the left compared with the right but still 5/5.  Pt is right hand dominant.  Unable to illicit knee reflexes bilaterally - she states this is normal for her.  Skin: Skin is warm and dry.  Psychiatric: She has a normal mood and affect. Her behavior is normal. Judgment and thought content normal.   Results for orders placed or performed in visit on 10/16/14  POCT CBC  Result Value Ref Range   WBC 10.9 (A) 4.6 - 10.2 K/uL   Lymph, poc 2.8 0.6 - 3.4   POC LYMPH PERCENT 26.0 10 - 50 %L   MID (cbc) 0.4 0 - 0.9   POC MID % 4.0 0 - 12 %M   POC Granulocyte 7.6 (A) 2 - 6.9   Granulocyte percent 70.0 37 - 80 %G   RBC 5.29 4.04 - 5.48 M/uL   Hemoglobin 12.4 12.2 - 16.2 g/dL   HCT, POC 40.2 37.7 - 47.9 %   MCV 76.1 (A) 80 - 97 fL   MCH, POC 23.5 (A) 27 - 31.2 pg   MCHC 30.8 (A) 31.8 - 35.4 g/dL   RDW, POC 18.5 %   Platelet Count, POC 258 142 - 424 K/uL   MPV 10.6 0 - 99.8 fL  POCT glucose (manual entry)  Result Value Ref Range   POC Glucose 109 (A) 70 - 99 mg/dl       Assessment & Plan:  Weakness - Plan: POCT CBC, POCT glucose (manual entry)  Disequilibrium  HTN - ok control at this time - she will continue her medication  Antalgic gait  Obesity, Class III, BMI 40-49.9 (morbid obesity)  Smoker - needs to quit   Due to symptoms she needs to be further evaluated by CT and possibly neurology.  She has had the symptoms for >12h so she will go by personal vehicle to [facility] for evaluation and most likely head CT. This was discussed with the patient and she agrees with the  above.  D/w Dr Jonna Clark PA-C  [facility] 10/16/2014 2:50 PM

## 2014-10-16 NOTE — H&P (Signed)
[facility] Admission History and Physical Service Pager: [phone]  Patient name: [name] Medical record number: [id number] Date of birth: [birth year] Age: 43 y.o. Gender: female  Primary Care Provider: Jenny Reichmann, MD Consultants: Neuro Code Status: Full  Chief Complaint: Dizziness, left-sided weakness  Assessment and Plan: [name] is a 43 y.o. female presenting with dizziness and left-sided weakness . PMH is significant for hypertension, tobacco abuse, OSA, obesity.  #CVA: Patient presented with complaints of dizziness and left-sided weakness 1 day. These symptoms have improved since onset the night before admission. CT performed at admission showed evidence consistent with possible nonhemorrhagic infarcts. No masses noted. No hypoglycemia. No presyncopal event reported. - Admit to telemetry, attending Dr. Andria Frames - Neurology following - appreciate the assistance - f/u MRI/MRA brain w/o contrast - f/u Carotid dopplers - f/u Echocardiogram - PT/OT - ASA 325mg  daily - Lipid panal, A1c in AM - atorvastatin 40mg  daily  #Balance/extremity issues: most likely related to CVA. Symptoms improved currently. - Will monitor - Neuro on board  #OSA: uses this regularly at home.  - continue CPAP qhs  #HTN: 124/81 on admission. Currently controlled on Micardis 80/12.5mg  - will continue home regimen of Micardis  #Anxiety/Depression:  - home medication of Xanax; patient denies any recent sxs or need for medication. Holding home med.  #Tobacco use: patient states that recent events have been a "wake up call". States her desire to quit. - will monitor nicotine withdrawal sxs - will provide nicotine replacement PRN - nicotine patch 7mg  daily prn  FEN/GI: NS 178ml/hr, heart healthy diet after passing swallow screen Prophylaxis: lovenox  Disposition: Admit to telemetry, attending Dr. Andria Frames  History of Present Illness: [name] is a 43  y.o. female presenting with a one-day history of dizziness and left-sided weakness. Patient states that late last night she noticed that she had become off balance and dizzy and was stumbling to one side (left) while getting ready for bed. She states that when she woke up the following morning she noticed a heaviness in her left leg and arm while getting ready for work. At work she was assessed by fellow coworkers, she works in the Physicist, medical. At that time she was told to report to the emergency department to get fully worked up. She states that as the day progressed the heaviness in her left extremities improved. She did experience some increased lightheadedness after voiding. She has never experienced any of these symptoms in the past. In the ED she complained of continued lightheadedness and feeling "off".   She denies any sweats fever chest pain shortness of breath vision changes. No nausea vomiting. No issues with swallowing. No facial droop. No blurry vision. No headaches. No issues with concentration.  Review Of Systems: Per HPI with the following additions: None Otherwise 12 point review of systems was performed and was unremarkable.  Patient Active Problem List   Diagnosis Date Noted  . Obesity, Class III, BMI 40-49.9 (morbid obesity) 10/16/2014  . Smoker 10/16/2014  . OSA on CPAP 10/16/2014  . Acute CVA (cerebrovascular accident) 10/16/2014  . Cough 03/19/2013  . HTN (hypertension) 01/14/2012  . Anemia 01/14/2012  . Sickle cell trait 01/14/2012   Past Medical History: Past Medical History  Diagnosis Date  . Hypertension   . Smoker   . Allergy   . Anemia   . Depression   . Anxiety   . Sickle cell trait   . Iron overload, transfusional   .  Sickle cell anemia     trait  . Clotting disorder     from beeing anemic   Past Surgical History: Past Surgical History  Procedure Laterality Date  . Cervical ablation    . Tubal ligation    . Cholecystectomy    . Cesarean section       3 times   Social History: History  Substance Use Topics  . Smoking status: Current Every Day Smoker -- 20 years    Types: Cigarettes  . Smokeless tobacco: Never Used     Comment: smoke 5 cigarettes/day or prn  . Alcohol Use: Yes     Comment: once maybe a month if that   Additional social history: smoking; ~2packs per WEEK  Please also refer to relevant sections of EMR.  Family History: Family History  Problem Relation Age of Onset  . Mental illness Mother   . Hyperlipidemia Mother   . Hyperthyroidism Mother   . Mental retardation Mother   . ADD / ADHD Son   . Diabetes Father   . Cancer Father   . Cancer Paternal Aunt     breast  . Arthritis Maternal Grandmother   . Diabetes Maternal Grandmother   . Hearing loss Maternal Grandmother     left hear when she was a child  . Hyperlipidemia Maternal Grandmother   . Hypertension Maternal Grandmother   . Alcohol abuse Maternal Grandfather   . Early death Maternal Grandfather   . Cancer Paternal Aunt     breast   Allergies and Medications: Allergies  Allergen Reactions  . Iron Shortness Of Breath    IV only   . Delsym [Dextromethorphan Polistirex Er] Other (See Comments)    nightmares  . Doxycycline Other (See Comments)    Bad taste to patient   No current facility-administered medications on file prior to encounter.   Current Outpatient Prescriptions on File Prior to Encounter  Medication Sig Dispense Refill  . ergocalciferol (DRISDOL) 50000 UNITS capsule Take 1 capsule (50,000 Units total) by mouth once a week. 4 capsule 5  . rizatriptan (MAXALT) 10 MG tablet Take 1 tablet (10 mg total) by mouth as needed for migraine. May repeat in 2 hours if needed 10 tablet 0  . telmisartan-hydrochlorothiazide (MICARDIS HCT) 80-12.5 MG per tablet One daily 90 tablet 3  . ALPRAZolam (XANAX) 0.5 MG tablet Take 1 tablet (0.5 mg total) by mouth 2 (two) times daily as needed for anxiety. Take up to two tablets daily for anxiety 60  tablet 0    Objective: BP 129/83 mmHg  Pulse 105  Temp(Src) 98.5 F (36.9 C)  Resp 18  Ht 5\' 3"  (1.6 m)  Wt 296 lb (134.265 kg)  BMI 52.45 kg/m2  SpO2 100%  LMP 09/04/2014 Exam: General -- oriented x3, pleasant and cooperative. Obese. HEENT -- Head is normocephalic. Eyes, pupils equal and round and react to light and accommodation. Extraocular motions are intact. Neck -- supple; no bruits. Integument -- intact. No rash, erythema, or ecchymoses.  Chest -- good expansion. Lungs clear to auscultation. Cardiac -- RRR. No murmurs noted.  Abdomen -- soft, nontender. No masses palpable. Normal bowel sounds present. CNS -- cranial nerves II through XII grossly intact. Reflexes difficult to assess bilaterally Musculoskeletal - no tenderness or effusions noted. ROM good. 5/5 bilateral strength. POSSIBLE 4/5 Left extensor hallucis longus vs. 5/5 on Rt.  Dorsalis pedis pulses present and symmetrical.     Labs and Imaging: CBC BMET   Recent Labs Lab 10/16/14  1557 10/16/14 1619  WBC 11.0*  --   HGB 12.7 14.3  HCT 39.3 42.0  PLT 280  --     Recent Labs Lab 10/16/14 1557 10/16/14 1619  NA 139 140  K 4.1 3.9  CL 102 102  CO2 26  --   BUN 8 7  CREATININE 0.77 0.90  GLUCOSE 100* 102*  CALCIUM 10.7*  --     CT Head 12/3 IMPRESSION: 1. Small areas of low attenuation in the right frontal lobe and in the right corona radiata concerning for acute versus subacute nonhemorrhagic infarcts.   Georges Lynch, MD 10/16/2014, 6:50 PM PGY-1, [place] Intern pager: [phone], text pages welcome  I have seen and examined the patient. I have read and agree with the above note. My changes are noted in blue.  Cordelia Poche, MD PGY-2, [facility] 10/16/2014, 10:39 PM

## 2014-10-17 ENCOUNTER — Encounter (HOSPITAL_COMMUNITY): Payer: Self-pay | Admitting: *Deleted

## 2014-10-17 DIAGNOSIS — G35 Multiple sclerosis: Principal | ICD-10-CM

## 2014-10-17 DIAGNOSIS — R531 Weakness: Secondary | ICD-10-CM

## 2014-10-17 DIAGNOSIS — I369 Nonrheumatic tricuspid valve disorder, unspecified: Secondary | ICD-10-CM

## 2014-10-17 LAB — HEMOGLOBIN A1C
HEMOGLOBIN A1C: 6.1 % — AB (ref <5.7)
MEAN PLASMA GLUCOSE: 128 mg/dL — AB (ref <117)

## 2014-10-17 LAB — LIPID PANEL
Cholesterol: 170 mg/dL (ref 0–200)
HDL: 36 mg/dL — ABNORMAL LOW (ref >39)
LDL CALC: 97 mg/dL (ref 0–99)
TRIGLYCERIDES: 185 mg/dL — AB (ref <150)
Total CHOL/HDL Ratio: 4.7 RATIO
VLDL: 37 mg/dL (ref 0–40)

## 2014-10-17 LAB — SEDIMENTATION RATE: Sed Rate: 14 mm/hr (ref 0–22)

## 2014-10-17 MED ORDER — METHYLPREDNISOLONE SODIUM SUCC 1000 MG IJ SOLR
500.0000 mg | Freq: Two times a day (BID) | INTRAMUSCULAR | Status: AC
  Administered 2014-10-17 – 2014-10-21 (×10): 500 mg via INTRAVENOUS
  Filled 2014-10-17 (×10): qty 4

## 2014-10-17 MED ORDER — PANTOPRAZOLE SODIUM 40 MG IV SOLR
INTRAVENOUS | Status: AC
  Filled 2014-10-17: qty 40

## 2014-10-17 MED ORDER — PANTOPRAZOLE SODIUM 40 MG IV SOLR
40.0000 mg | INTRAVENOUS | Status: DC
  Administered 2014-10-17: 40 mg via INTRAVENOUS

## 2014-10-17 NOTE — Progress Notes (Signed)
Occupational Therapy Evaluation Patient Details Name: [name] MRN: [id number] DOB: [birth year] Today's Date: 10/17/2014    History of Present Illness 43 y.o. female admitted to [facility] on 10/16/14 for left sided weakness.  Suspected stroke, but imaging reveals a demylenating process suspicious for MS.  Pt with significant PMHx of HTN, anemia, depression, anxiety, and  sickle cell anemia.   Clinical Impression   PTA pt lived at home and was independent with ADLs. Pt currently at Mod I level with ADLs and educated on increasing safety at home. Pt reports she is trying to remain positive. Education completed and OT provided therapeutic listening and encouragement. No further acute OT needs.     Follow Up Recommendations  No OT follow up;Supervision - Intermittent    Equipment Recommendations  None recommended by OT    Recommendations for Other Services       Precautions / Restrictions Precautions Precautions: None      Mobility Bed Mobility Overal bed mobility: Independent                Transfers Overall transfer level: Independent Equipment used: None                  Balance Overall balance assessment: Needs assistance Sitting-balance support: Feet supported;No upper extremity supported Sitting balance-Leahy Scale: Good     Standing balance support: No upper extremity supported Standing balance-Leahy Scale: Good                              ADL Overall ADL's : Modified independent                                       General ADL Comments: Pt reports that she is taking her time and going slowly. Educated pt on ways to increase safety at home through fall prevention and energy conservation. Pt is eager to return home. Pt became tearful when discussing possible dx of MS. Provided therapeutic listening and encouragement. Pt has strong family support.      Vision  Pt reports no change from baseline. No apparent deficits.                 Additional Comments: Vision screen [facility].   Perception Perception Perception Tested?: No   Praxis Praxis Praxis tested?: Within functional limits    Pertinent Vitals/Pain Pain Assessment: No/denies pain     Hand Dominance Right   Extremity/Trunk Assessment Upper Extremity Assessment Upper Extremity Assessment: Overall WFL for tasks assessed (strength grossly 5/5, LUE shoulder flexion 5-/5)   Lower Extremity Assessment Lower Extremity Assessment: Defer to PT evaluation LLE Deficits / Details: left leg mildly weaker at a 4/5 level when tested EOB. Pt reports she feels stronger than on admission.   LLE Sensation:  (WNL)   Cervical / Trunk Assessment Cervical / Trunk Assessment: Normal   Communication Communication Communication: No difficulties   Cognition Arousal/Alertness: Awake/alert Behavior During Therapy: WFL for tasks assessed/performed Overall Cognitive Status: Within Functional Limits for tasks assessed                                Home Living Family/patient expects to be discharged to:: Private residence Living Arrangements: Children Available Help at Discharge: Family;Available PRN/intermittently Type of Home: House Home Access: Stairs to enter  Home Layout: Multi-level Alternate Level Stairs-Number of Steps: flight Alternate Level Stairs-Rails: Left Bathroom Shower/Tub: Occupational psychologist: Standard     Home Equipment: None          Prior Functioning/Environment Level of Independence: Independent             OT Diagnosis: Generalized weakness                          End of Session  Activity Tolerance: Patient tolerated treatment well Patient left: Other (comment);with call bell/phone within reach   Time: 1435-1445 OT Time Calculation (min): 10 min Charges:  OT General Charges $OT Visit: 1 Procedure OT Evaluation $Initial OT Evaluation Tier I: 1 Procedure  Villa Herb  M 10/17/2014, 2:56 PM   Cyndie Chime, OTR/L Occupational Therapist [phone] (pager)

## 2014-10-17 NOTE — Progress Notes (Signed)
Evaluated patient for possible lumbar puncture. Due to body habitus unable to palpate any landmarks. Low likelihood of successful LP at bedside. Feel she is best suited for LP under fluoroscopy. Notified primary team.   Jim Like, DO [facility] [phone]  If 7pm- 7am, please page neurology on call as listed in [place]. '

## 2014-10-17 NOTE — Progress Notes (Signed)
Family Medicine Teaching Service Daily Progress Note Intern Pager: [phone]  Patient name: [name] Medical record number: [id number] Date of birth: 24-Nov-1970 Age: 43 y.o. Gender: female  Primary Care Provider: Jenny Reichmann, MD Consultants: neuro, cardiology Code Status: full  Pt Overview and Major Events to Date:  12/3: patient admitted for stroke-like symptoms  Assessment and Plan: [name] is a 43 y.o. female presenting with dizziness and left-sided weakness . PMH is significant for hypertension, tobacco abuse, OSA, obesity.  #Suspected Multiple Sclerosis: Patient presented with complaints of dizziness and left-sided weakness 1 day. These symptoms have improved since onset the night before admission. CT performed at admission showed evidence consistent with possible nonhemorrhagic infarcts. No masses noted. No hypoglycemia. No presyncopal event reported. MRI showed multiple halo concentric sclerotic lesions. Highly suspicious for MS - Neurology following - appreciate the assistance - MRI/MRA brain w/o contrast - Cancelled Carotid doppler and Echocardiogram orders - PT/OT - Solumedrol started - LP w/ labs pending.   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 Symptoms improved currently. - Will monitor - Neuro on board  #OSA: uses this regularly at home.  - continue CPAP qhs  #HTN: 124/81 on admission. Currently controlled on Micardis 80/12.5mg  - will continue home regimen of Micardis  #Anxiety/Depression:  - home medication of Xanax; patient denies any recent sxs or need for medication. Holding home med.  #Tobacco use: States her desire to quit. - will monitor nicotine withdrawal sxs - will provide nicotine replacement PRN - nicotine patch 7mg  daily prn  FEN/GI: NS 176ml/hr, heart healthy diet after passing swallow screen Prophylaxis: lovenox   Disposition: home when deemed medically stable  Subjective:  Patient is obviously upset  after being given the news of her likely diagnosis. I had a lengthy discussion with her about this. She has a great support system at home. I urged patient to listen to her neurologist, perpared her for many f/u appts w/ her physicians. She was concerned that she could have prevented this--I informed patient that this is not the case.  Patient has a blood-related uncle w/ MS.   Objective: Temp:  [98.2 F (36.8 C)-98.8 F (37.1 C)] 98.2 F (36.8 C) (12/04 0600) Pulse Rate:  [85-105] 85 (12/04 0800) Resp:  [17-20] 18 (12/04 0800) BP: (107-158)/(66-106) 115/85 mmHg (12/04 0800) SpO2:  [96 %-100 %] 99 % (12/04 0800) Weight:  [286 lb 3.2 oz (129.819 kg)-296 lb (134.265 kg)] 286 lb 3.2 oz (129.819 kg) (12/03 1951) Physical Exam: General -- oriented x3, pleasant and cooperative. Obese. HEENT -- Head is normocephalic. Eyes, pupils equal and round and react to light and accommodation. Extraocular motions are intact. Neck -- supple; no bruits. Integument -- intact. No rash, erythema, or ecchymoses.  Chest -- good expansion. Lungs clear to auscultation. Cardiac -- RRR. No murmurs noted.  Abdomen -- soft, nontender. No masses palpable. Normal bowel sounds present. CNS -- cranial nerves II through XII grossly intact. Musculoskeletal - no tenderness or effusions noted. ROM good. 5/5 bilateral strength. Dorsalis pedis pulses present and symmetrical.   Laboratory:  Recent Labs Lab 10/16/14 1417 10/16/14 1557 10/16/14 1619  WBC 10.9* 11.0*  --   HGB 12.4 12.7 14.3  HCT 40.2 39.3 42.0  PLT  --  280  --     Recent Labs Lab 10/16/14 1557 10/16/14 1619  NA 139 140  K 4.1 3.9  CL 102 102  CO2 26  --   BUN 8 7  CREATININE 0.77 0.90  CALCIUM 10.7*  --   PROT 7.7  --   BILITOT 0.2*  --   ALKPHOS 87  --   ALT 24  --   AST 17  --   GLUCOSE 100* 102*    Imaging/Diagnostic Tests: CT head 12/3 IMPRESSION: 1. Small areas of low attenuation in the right frontal lobe and in the right  corona radiata concerning for acute versus subacute nonhemorrhagic infarcts.  MRI/MRA 12/4 IMPRESSION: MRI HEAD IMPRESSION:  1. Multiple T2/FLAIR hyperintense lesions with associated restricted diffusion involving the white matter of both cerebral hemispheres, right greater than left, most consistent with active demyelinating disease. 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 Follow-up examination with postcontrast imaging could be performed for complete evaluation. 2. No other findings to suggest acute intracranial infarct or other process.  MRA HEAD IMPRESSION:  1. Moderate atherosclerotic irregularity within the proximal petrous segment of the right internal carotid artery without associated hemodynamically significant stenosis. 2. Otherwise unremarkable MRA of the brain.   Elberta Leatherwood, MD 10/17/2014, 12:46 PM PGY-1, [place] Intern pager: [phone], text pages welcome

## 2014-10-17 NOTE — Progress Notes (Signed)
Contacted respiratory to bring facility CPAP for pts use although she brought her own [employer] not available at this time to check proper functioning of pts own machine so she may use it here. Respiratory stated en route to discuss options w/ patient.

## 2014-10-17 NOTE — Progress Notes (Signed)
*  PRELIMINARY RESULTS* Vascular Ultrasound Carotid Duplex has been completed.  Preliminary findings: Bilateral:  1-39% ICA stenosis.  Vertebral artery flow is antegrade.      Landry Mellow, RDMS, RVT  10/17/2014, 9:38 AM

## 2014-10-17 NOTE — Evaluation (Signed)
Physical Therapy Evaluation Patient Details Name: [name] MRN: [id number] DOB: Apr 17, 1971 Today's Date: 10/17/2014   History of Present Illness  43 y.o. female admitted to [name] on 10/16/14 for left sided weakness.  Suspected stroke, but imaging reveals a demylenating process suspicious for MS.  Pt with significant PMHx of HTN, anemia, depression, anxiety, and  sickle cell anemia.  Clinical Impression  Pt is tearful throughout session.  Digesting possible dx of MS.  She is very mildly weak on her left side, no numbness or tingling and functionally is moving very well (slower and more cautiously than usual, but at a mod I and I level).  Pt does not currently need any PT follow up at discharge.  Resources given on MS support groups in the area if she does get a more definitive dx of MS.  PT to sign of as pt is independent and all education completed.      Follow Up Recommendations No PT follow up    Equipment Recommendations  None recommended by PT    Recommendations for Other Services   NA    Precautions / Restrictions Precautions Precautions: None      Mobility  Bed Mobility Overal bed mobility: Independent                Transfers Overall transfer level: Independent Equipment used: None                Ambulation/Gait Ambulation/Gait assistance: Modified independent (Device/Increase time) Ambulation Distance (Feet): 100 Feet Assistive device: None Gait Pattern/deviations: Step-through pattern Gait velocity: decreased Gait velocity interpretation: Below normal speed for age/gender General Gait Details: Subjectively, pt reports her gait is slower and more cautious than normal, but no external assist needed and no signs of buckling in her left leg.    Stairs Stairs: Yes Stairs assistance: Modified independent (Device/Increase time) Stair Management: One rail Right;One rail Left;Step to pattern;Forwards Number of Stairs: 9 General stair comments: pt could  lead up with either leg as long as she did step to pattern and had railing for support.  Pt came down holding railing with both hands, turned slightly and leading down with her left foot.  Verbal cues for what may be her safest LE sequencing, but pt informed me that she has a right knee issue (torn cartilege) and this may affect her stair ability as well.  I told her to take her time, do one step at a time and lead with whichever leg feels most stable both up and down.       Balance Overall balance assessment: Needs assistance Sitting-balance support: Feet supported;No upper extremity supported Sitting balance-Leahy Scale: Good     Standing balance support: No upper extremity supported Standing balance-Leahy Scale: Good                               Pertinent Vitals/Pain Pain Assessment: No/denies pain    Home Living Family/patient expects to be discharged to:: Private residence Living Arrangements: Children Available Help at Discharge: Family;Available PRN/intermittently Type of Home: House       Home Layout: Multi-level Home Equipment: None      Prior Function Level of Independence: Independent               Hand Dominance   Dominant Hand: Right    Extremity/Trunk Assessment   Upper Extremity Assessment: Defer to OT evaluation  Lower Extremity Assessment: LLE deficits/detail   LLE Deficits / Details: left leg mildly weaker at a 4/5 level when tested EOB. Pt reports she feels stronger than on admission.    Cervical / Trunk Assessment: Normal  Communication   Communication: No difficulties  Cognition Arousal/Alertness: Awake/alert Behavior During Therapy: WFL for tasks assessed/performed Overall Cognitive Status: Within Functional Limits for tasks assessed                               Assessment/Plan    PT Assessment Patent does not need any further PT services  PT Diagnosis Difficulty walking;Abnormality of  gait;Hemiplegia non-dominant side         PT Goals (Current goals can be found in the Care Plan section) Acute Rehab PT Goals Patient Stated Goal: to stay positive PT Goal Formulation: All assessment and education complete, DC therapy     End of Session   Activity Tolerance: Patient tolerated treatment well Patient left: in bed;with call bell/phone within reach (seated EOB) Nurse Communication: Mobility status         Time: 5329-9242 (MD came in for ~ 20 mins, PT stepped out, no charge) PT Time Calculation (min) (ACUTE ONLY): 47 min   Charges:   PT Evaluation $Initial PT Evaluation Tier I: 1 Procedure PT Treatments $Therapeutic Activity: 8-22 mins        [name] B. [place], [place], DPT [phone]   10/17/2014, 12:22 PM

## 2014-10-17 NOTE — Progress Notes (Addendum)
Subjective: No overnight events. Continues to have weakness and gait instability.   Imaging personally reviewed with neuroradiology, findings consistent with active demyelination. Areas of DWI restriction likely indicate active lesions.   Objective: Current vital signs: BP 115/85 mmHg  Pulse 85  Temp(Src) 98.2 F (36.8 C) (Oral)  Resp 18  Ht $R'5\' 3"'pz$  (1.6 m)  Wt 129.819 kg (286 lb 3.2 oz)  BMI 50.71 kg/m2  SpO2 99%  LMP 09/04/2014 Vital signs in last 24 hours: Temp:  [98.2 F (36.8 C)-98.8 F (37.1 C)] 98.2 F (36.8 C) (12/04 0600) Pulse Rate:  [85-105] 85 (12/04 0800) Resp:  [17-20] 18 (12/04 0800) BP: (107-158)/(66-106) 115/85 mmHg (12/04 0800) SpO2:  [96 %-100 %] 99 % (12/04 0800) Weight:  [129.819 kg (286 lb 3.2 oz)-134.265 kg (296 lb)] 129.819 kg (286 lb 3.2 oz) (12/03 1951)  Intake/Output from previous day: 12/03 0701 - 12/04 0700 In: 1256.7 [P.O.:390; I.V.:866.7] Out: -  Intake/Output this shift:   Nutritional status: Diet Heart  Neurologic Exam: Mental Status: Alert, oriented, thought content appropriate. Speech fluent without evidence of aphasia. Able to follow 3 step commands without difficulty. Cranial Nerves: II: funduscopic exam wnl bilaterally, visual fields grossly normal, pupils equal, round, reactive to light and accommodation III,IV, VI: ptosis not present, extra-ocular motions intact bilaterally V,VII: smile symmetric, facial light touch sensation normal bilaterally VIII: hearing normal bilaterally IX,X: gag reflex present XI: trapezius strength/neck flexion strength normal bilaterally XII: tongue strength normal  Motor: 5/5 strength on the right side UE and LE. 5-/5 proximal and distal LUE. 5-/5 proximal LLE to 5/5 distal Tone and bulk:normal tone throughout; no atrophy noted Sensory: Pinprick and light touch intact throughout, bilaterally Deep Tendon Reflexes: 2+ and symmetric throughout Plantars: Right:  downgoingLeft: downgoing Cerebellar: normal finger-to-nose, normal rapid alternating movements and normal heel-to-shin test Gait: deferred due to multiple leads in the ED  Lab Results: Basic Metabolic Panel:  Recent Labs Lab 10/16/14 1557 10/16/14 1619  NA 139 140  K 4.1 3.9  CL 102 102  CO2 26  --   GLUCOSE 100* 102*  BUN 8 7  CREATININE 0.77 0.90  CALCIUM 10.7*  --     Liver Function Tests:  Recent Labs Lab 10/16/14 1557  AST 17  ALT 24  ALKPHOS 87  BILITOT 0.2*  PROT 7.7  ALBUMIN 3.9   No results for input(s): LIPASE, AMYLASE in the last 168 hours. No results for input(s): AMMONIA in the last 168 hours.  CBC:  Recent Labs Lab 10/16/14 1417 10/16/14 1557 10/16/14 1619  WBC 10.9* 11.0*  --   NEUTROABS  --  7.3  --   HGB 12.4 12.7 14.3  HCT 40.2 39.3 42.0  MCV 76.1* 73.6*  --   PLT  --  280  --     Cardiac Enzymes: No results for input(s): CKTOTAL, CKMB, CKMBINDEX, TROPONINI in the last 168 hours.  Lipid Panel:  Recent Labs Lab 10/17/14 0605  CHOL 170  TRIG 185*  HDL 36*  CHOLHDL 4.7  VLDL 37  LDLCALC 97    CBG: No results for input(s): GLUCAP in the last 168 hours.  Microbiology: Results for orders placed or performed during the hospital encounter of 07/05/09  Technologist smear review     Status: None   Collection Time: 07/05/09  3:15 AM  Result Value Ref Range Status   Path Review   Final    POLYCHROMASIA PRESENT LARGE PLATELETS PRESENT TARGET CELLS SPHEROCYTES    Coagulation Studies:  Recent  Labs  10/16/14 1557  LABPROT 12.6  INR 0.94    Imaging: Ct Head (brain) Wo Contrast  10/16/2014   CLINICAL DATA:  Left-sided weakness for 2 days  EXAM: CT HEAD WITHOUT CONTRAST  TECHNIQUE: Contiguous axial images were obtained from the base of the skull through the vertex without intravenous contrast.  COMPARISON:  07/30/2009  FINDINGS: There is no evidence of mass effect, midline shift or extra-axial  fluid collections. There is no evidence of a space-occupying lesion or intracranial hemorrhage. There is a small area of low attenuation in the right frontal lobe and in the right corona radiata.  The ventricles and sulci are appropriate for the patient's age. The basal cisterns are patent.  Visualized portions of the orbits are unremarkable. The visualized portions of the paranasal sinuses and mastoid air cells are unremarkable.  The osseous structures are unremarkable.  IMPRESSION: 1. Small areas of low attenuation in the right frontal lobe and in the right corona radiata concerning for acute versus subacute nonhemorrhagic infarcts. These results were called by telephone at the time of interpretation on 10/16/2014 at 4:44 pm to DR. DELO, who verbally acknowledged these results.   Electronically Signed   By: Kathreen Devoid   On: 10/16/2014 16:45   Mr Brain Wo Contrast  10/17/2014   CLINICAL DATA:  Initial evaluation for possible stroke. Patient with dizziness and left-sided weakness. Past medical history of hypertension, tobacco abuse, obesity.  EXAM: MRI HEAD WITHOUT CONTRAST  MRA HEAD WITHOUT CONTRAST  TECHNIQUE: Multiplanar, multiecho pulse sequences of the brain and surrounding structures were obtained without intravenous contrast. Angiographic images of the head were obtained using MRA technique without contrast.  COMPARISON:  Prior CT from 10/16/2014.  FINDINGS: MRI HEAD FINDINGS  Cerebral volume is within normal limits for patient age. Ventricles are normal in size without evidence of hydrocephalus. No extra-axial fluid collection. No midline shift or significant mass effect.  There is a round mass like lesion centered within the deep white matter of the posterior right centrum semi ovale measuring 1.4 x 1.5 x 1.5 cm. This lesion demonstrates alternating concentric rings of hyper and hypo intense T2 signal intensity. This lesion demonstrates peripheral restricted diffusion. 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 A second lesion centered in the mid right corona radiata measures 1.0 x 0.9 x 1.0 cm. There is a small subcentimeter lesion present within the deep white matter of the anterior right frontal lobe (series 6, image 89). An additional somewhat more ill-defined lesion with similar signal characteristics seen more anteriorly and inferiorly within the right frontotemporal region at the anterior insular cortex measures 1.2 x 2.1 x 0.8 cm. An additional lesion more inferiorly within the medial right temporal lobe measuring 1.1 x 1.0 cm. Subcentimeter lesions adjacent to the atria of the lateral ventricles present as well, right greater than left. Small periventricular lesions seen more superiorly within the mid left frontal lobe. No infratentorial lesions.  Craniocervical junction within normal limits. Visualized upper cervical spine unremarkable.  Pituitary gland within normal limits. No acute abnormality seen about the orbits.  Calvarium intact.  Scalp soft tissues within normal limits.  MRA HEAD FINDINGS  ANTERIOR CIRCULATION:  Visualized portions of the distal cervical segments of the internal carotid arteries are widely patent with antegrade flow. There is moderate atherosclerotic irregularity within the proximal aspect of the petrous right ICA without significant stenosis. The cavernous, and supra clinoid segments are symmetric in caliber bilaterally. A1 segments, anterior communicating  artery, and anterior cerebral arteries are widely patent. Middle cerebral arteries are widely patent with antegrade flow without high-grade flow-limiting stenosis or proximal branch occlusion. Distal MCA branches are symmetric bilaterally. No intracranial aneurysm within the anterior circulation.  POSTERIOR CIRCULATION:  The vertebral arteries are widely patent with antegrade flow. The posterior inferior cerebral arteries are normal. Vertebrobasilar junction and  basilar artery are widely patent with antegrade flow without evidence of basilar tip stenosis or aneurysm. Posterior cerebral arteries are normal bilaterally. The superior cerebellar arteries and anterior inferior cerebellar arteries are widely patent bilaterally. No intracranial aneurysm within the posterior circulation.  IMPRESSION: MRI HEAD IMPRESSION:  1. Multiple T2/FLAIR hyperintense lesions with associated restricted diffusion involving the white matter of both cerebral hemispheres, right greater than left, most consistent with active demyelinating disease. 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 Follow-up examination with postcontrast imaging could be performed for complete evaluation. 2. No other findings to suggest acute intracranial infarct or other process.  MRA HEAD IMPRESSION:  1. Moderate atherosclerotic irregularity within the proximal petrous segment of the right internal carotid artery without associated hemodynamically significant stenosis. 2. Otherwise unremarkable MRA of the brain.   Electronically Signed   By: Jeannine Boga M.D.   On: 10/17/2014 00:43   Mr Jodene Nam Head/brain Wo Cm  10/17/2014   CLINICAL DATA:  Initial evaluation for possible stroke. Patient with dizziness and left-sided weakness. Past medical history of hypertension, tobacco abuse, obesity.  EXAM: MRI HEAD WITHOUT CONTRAST  MRA HEAD WITHOUT CONTRAST  TECHNIQUE: Multiplanar, multiecho pulse sequences of the brain and surrounding structures were obtained without intravenous contrast. Angiographic images of the head were obtained using MRA technique without contrast.  COMPARISON:  Prior CT from 10/16/2014.  FINDINGS: MRI HEAD FINDINGS  Cerebral volume is within normal limits for patient age. Ventricles are normal in size without evidence of hydrocephalus. No extra-axial fluid collection. No midline  shift or significant mass effect.  There is a round mass like lesion centered within the deep white matter of the posterior right centrum semi ovale measuring 1.4 x 1.5 x 1.5 cm. This lesion demonstrates alternating concentric rings of hyper and hypo intense T2 signal intensity. This lesion demonstrates peripheral restricted diffusion. 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 A second lesion centered in the mid right corona radiata measures 1.0 x 0.9 x 1.0 cm. There is a small subcentimeter lesion present within the deep white matter of the anterior right frontal lobe (series 6, image 89). An additional somewhat more ill-defined lesion with similar signal characteristics seen more anteriorly and inferiorly within the right frontotemporal region at the anterior insular cortex measures 1.2 x 2.1 x 0.8 cm. An additional lesion more inferiorly within the medial right temporal lobe measuring 1.1 x 1.0 cm. Subcentimeter lesions adjacent to the atria of the lateral ventricles present as well, right greater than left. Small periventricular lesions seen more superiorly within the mid left frontal lobe. No infratentorial lesions.  Craniocervical junction within normal limits. Visualized upper cervical spine unremarkable.  Pituitary gland within normal limits. No acute abnormality seen about the orbits.  Calvarium intact.  Scalp soft tissues within normal limits.  MRA HEAD FINDINGS  ANTERIOR CIRCULATION:  Visualized portions of the distal cervical segments of the internal carotid arteries are widely patent with antegrade flow. There is moderate atherosclerotic irregularity within the proximal aspect of the petrous right ICA  without significant stenosis. The cavernous, and supra clinoid segments are symmetric in caliber bilaterally. A1 segments, anterior communicating artery, and anterior cerebral arteries are widely patent. Middle cerebral arteries are widely patent  with antegrade flow without high-grade flow-limiting stenosis or proximal branch occlusion. Distal MCA branches are symmetric bilaterally. No intracranial aneurysm within the anterior circulation.  POSTERIOR CIRCULATION:  The vertebral arteries are widely patent with antegrade flow. The posterior inferior cerebral arteries are normal. Vertebrobasilar junction and basilar artery are widely patent with antegrade flow without evidence of basilar tip stenosis or aneurysm. Posterior cerebral arteries are normal bilaterally. The superior cerebellar arteries and anterior inferior cerebellar arteries are widely patent bilaterally. No intracranial aneurysm within the posterior circulation.  IMPRESSION: MRI HEAD IMPRESSION:  1. Multiple T2/FLAIR hyperintense lesions with associated restricted diffusion involving the white matter of both cerebral hemispheres, right greater than left, most consistent with active demyelinating disease. 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 Follow-up examination with postcontrast imaging could be performed for complete evaluation. 2. No other findings to suggest acute intracranial infarct or other process.  MRA HEAD IMPRESSION:  1. Moderate atherosclerotic irregularity within the proximal petrous segment of the right internal carotid artery without associated hemodynamically significant stenosis. 2. Otherwise unremarkable MRA of the brain.   Electronically Signed   By: Jeannine Boga M.D.   On: 10/17/2014 00:43    Medications:  Scheduled: . aspirin  300 mg Rectal Daily   Or  . aspirin  325 mg Oral Daily  . atorvastatin  40 mg Oral q1800  . enoxaparin (LOVENOX) injection  40 mg Subcutaneous Q24H  . hydrochlorothiazide  12.5 mg Oral Daily  . irbesartan  300 mg Oral Daily  . nicotine  7 mg Transdermal Q24H    Assessment/Plan:  43y/o woman admitted  with left sided weakness and gait instability. Initial concern for lacunar infarct due to multiple risk factors. 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 There appears to be lesions with active DWI restriction and some older lesions indicating separation in time and space.   -suggest starting IV solumedrol $RemoveBefor'500mg'[id number]$  BID x 5 days. Add GI prophylaxis -check MRI brain with contrast and MRI C spine with and without -check ANA, ESR -LP under IR to check CSF for cell count, protein, cryptococcal antigen, Gram stain, IgG index, oligoclonal bands, cytology and flow cytometry -rehab evaluation -will need outpatient neurology follow up upon discharge   LOS: 1 day   Jim Like, DO [facility] [phone]  If 7pm- 7am, please page neurology on call as listed in [place]. 10/17/2014  9:42 AM

## 2014-10-17 NOTE — Progress Notes (Signed)
  Echocardiogram 2D Echocardiogram has been performed.  [name], [name] 10/17/2014, 9:05 AM

## 2014-10-17 NOTE — Progress Notes (Signed)
Patient has home CPAP at bedside for use at night.  Patient is familiar with equipment and procedure and will self administer CPAP.  Patient advised to contact Respiratory with any concerns or issues.

## 2014-10-17 NOTE — Plan of Care (Signed)
Problem: Acute Treatment Outcomes Goal: Neuro exam at baseline or improved Outcome: Completed/Met Date Met:  10/17/14 Goal: BP within ordered parameters Outcome: Completed/Met Date Met:  10/17/14 Goal: Airway maintained/protected Outcome: Completed/Met Date Met:  10/17/14 Goal: 02 Sats > 94% Outcome: Completed/Met Date Met:  10/17/14 Goal: tPA Patient w/o S&S of bleeding Outcome: Not Applicable Date Met:  10/17/14  Problem: Progression Outcomes Goal: Communication method established Outcome: Completed/Met Date Met:  10/17/14 verbal Goal: If vent dependent, tolerates weaning Outcome: Not Applicable Date Met:  10/17/14 Goal: Pain controlled Outcome: Completed/Met Date Met:  10/17/14 No pain complaints Goal: Bowel & Bladder Continence Outcome: Completed/Met Date Met:  10/17/14  Problem: Discharge/Transitional Outcomes Goal: If vent dependent, trach in place Outcome: Not Applicable Date Met:  10/17/14     

## 2014-10-18 ENCOUNTER — Inpatient Hospital Stay (HOSPITAL_COMMUNITY): Payer: 59

## 2014-10-18 DIAGNOSIS — G35 Multiple sclerosis: Secondary | ICD-10-CM | POA: Insufficient documentation

## 2014-10-18 MED ORDER — PANTOPRAZOLE SODIUM 40 MG PO TBEC
40.0000 mg | DELAYED_RELEASE_TABLET | Freq: Every day | ORAL | Status: DC
  Administered 2014-10-18 – 2014-10-22 (×5): 40 mg via ORAL
  Filled 2014-10-18 (×5): qty 1

## 2014-10-18 MED ORDER — GADOBENATE DIMEGLUMINE 529 MG/ML IV SOLN
20.0000 mL | Freq: Once | INTRAVENOUS | Status: AC | PRN
  Administered 2014-10-18: 20 mL via INTRAVENOUS

## 2014-10-18 NOTE — Progress Notes (Signed)
Subjective: No overnight events. Day 2 of IV solumedrol, tolerating well.   Objective: Current vital signs: BP 125/76 mmHg  Pulse 109  Temp(Src) 98.6 F (37 C) (Oral)  Resp 20  Ht 5\' 3"  (1.6 m)  Wt 129.819 kg (286 lb 3.2 oz)  BMI 50.71 kg/m2  SpO2 97%  LMP 09/04/2014 Vital signs in last 24 hours: Temp:  [98.4 F (36.9 C)-98.7 F (37.1 C)] 98.6 F (37 C) (12/05 0559) Pulse Rate:  [90-114] 109 (12/05 0559) Resp:  [18-20] 20 (12/05 0559) BP: (125-140)/(70-86) 125/76 mmHg (12/05 0559) SpO2:  [95 %-99 %] 97 % (12/05 0559)  Intake/Output from previous day:   Intake/Output this shift:   Nutritional status: Diet Heart  Neurologic Exam: Mental Status: Alert, oriented, thought content appropriate. Speech fluent without evidence of aphasia. Able to follow 3 step commands without difficulty. Cranial Nerves: II: funduscopic exam wnl bilaterally, visual fields grossly normal, pupils equal, round, reactive to light and accommodation III,IV, VI: ptosis not present, extra-ocular motions intact bilaterally V,VII: smile symmetric, facial light touch sensation normal bilaterally VIII: hearing normal bilaterally IX,X: gag reflex present XI: trapezius strength/neck flexion strength normal bilaterally XII: tongue strength normal  Motor: 5/5 strength on the right side UE and LE. 5-/5 proximal and distal LUE. 5-/5 proximal LLE to 5/5 distal Tone and bulk:normal tone throughout; no atrophy noted Sensory: Pinprick and light touch intact throughout, bilaterally Deep Tendon Reflexes: 2+ and symmetric throughout Plantars: Right: downgoingLeft: downgoing Cerebellar: normal finger-to-nose, normal rapid alternating movements and normal heel-to-shin test Gait: deferred due to multiple leads in the ED  Lab Results: Basic Metabolic Panel:  Recent Labs Lab 10/16/14 1557 10/16/14 1619  NA 139 140  K 4.1 3.9  CL 102 102  CO2 26  --   GLUCOSE 100* 102*  BUN  8 7  CREATININE 0.77 0.90  CALCIUM 10.7*  --     Liver Function Tests:  Recent Labs Lab 10/16/14 1557  AST 17  ALT 24  ALKPHOS 87  BILITOT 0.2*  PROT 7.7  ALBUMIN 3.9   No results for input(s): LIPASE, AMYLASE in the last 168 hours. No results for input(s): AMMONIA in the last 168 hours.  CBC:  Recent Labs Lab 10/16/14 1417 10/16/14 1557 10/16/14 1619  WBC 10.9* 11.0*  --   NEUTROABS  --  7.3  --   HGB 12.4 12.7 14.3  HCT 40.2 39.3 42.0  MCV 76.1* 73.6*  --   PLT  --  280  --     Cardiac Enzymes: No results for input(s): CKTOTAL, CKMB, CKMBINDEX, TROPONINI in the last 168 hours.  Lipid Panel:  Recent Labs Lab 10/17/14 0605  CHOL 170  TRIG 185*  HDL 36*  CHOLHDL 4.7  VLDL 37  LDLCALC 97    CBG: No results for input(s): GLUCAP in the last 168 hours.  Microbiology: Results for orders placed or performed during the hospital encounter of 07/05/09  Technologist smear review     Status: None   Collection Time: 07/05/09  3:15 AM  Result Value Ref Range Status   Path Review   Final    POLYCHROMASIA PRESENT LARGE PLATELETS PRESENT TARGET CELLS SPHEROCYTES    Coagulation Studies:  Recent Labs  10/16/14 1557  LABPROT 12.6  INR 0.94    Imaging: Ct Head (brain) Wo Contrast  10/16/2014   CLINICAL DATA:  Left-sided weakness for 2 days  EXAM: CT HEAD WITHOUT CONTRAST  TECHNIQUE: Contiguous axial images were obtained from the base of the  skull through the vertex without intravenous contrast.  COMPARISON:  07/30/2009  FINDINGS: There is no evidence of mass effect, midline shift or extra-axial fluid collections. There is no evidence of a space-occupying lesion or intracranial hemorrhage. There is a small area of low attenuation in the right frontal lobe and in the right corona radiata.  The ventricles and sulci are appropriate for the patient's age. The basal cisterns are patent.  Visualized portions of the orbits are unremarkable. The visualized portions of the  paranasal sinuses and mastoid air cells are unremarkable.  The osseous structures are unremarkable.  IMPRESSION: 1. Small areas of low attenuation in the right frontal lobe and in the right corona radiata concerning for acute versus subacute nonhemorrhagic infarcts. These results were called by telephone at the time of interpretation on 10/16/2014 at 4:44 pm to DR. DELO, who verbally acknowledged these results.   Electronically Signed   By: Kathreen Devoid   On: 10/16/2014 16:45   Mr Brain Wo Contrast  10/17/2014   CLINICAL DATA:  Initial evaluation for possible stroke. Patient with dizziness and left-sided weakness. Past medical history of hypertension, tobacco abuse, obesity.  EXAM: MRI HEAD WITHOUT CONTRAST  MRA HEAD WITHOUT CONTRAST  TECHNIQUE: Multiplanar, multiecho pulse sequences of the brain and surrounding structures were obtained without intravenous contrast. Angiographic images of the head were obtained using MRA technique without contrast.  COMPARISON:  Prior CT from 10/16/2014.  FINDINGS: MRI HEAD FINDINGS  Cerebral volume is within normal limits for patient age. Ventricles are normal in size without evidence of hydrocephalus. No extra-axial fluid collection. No midline shift or significant mass effect.  There is a round mass like lesion centered within the deep white matter of the posterior right centrum semi ovale measuring 1.4 x 1.5 x 1.5 cm. This lesion demonstrates alternating concentric rings of hyper and hypo intense T2 signal intensity. This lesion demonstrates peripheral restricted diffusion. 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 A second lesion centered in the mid right corona radiata measures 1.0 x 0.9 x 1.0 cm. There is a small subcentimeter lesion present within the deep white matter of the anterior right frontal lobe (series 6, image 89). An additional somewhat more ill-defined lesion with similar signal characteristics seen  more anteriorly and inferiorly within the right frontotemporal region at the anterior insular cortex measures 1.2 x 2.1 x 0.8 cm. An additional lesion more inferiorly within the medial right temporal lobe measuring 1.1 x 1.0 cm. Subcentimeter lesions adjacent to the atria of the lateral ventricles present as well, right greater than left. Small periventricular lesions seen more superiorly within the mid left frontal lobe. No infratentorial lesions.  Craniocervical junction within normal limits. Visualized upper cervical spine unremarkable.  Pituitary gland within normal limits. No acute abnormality seen about the orbits.  Calvarium intact.  Scalp soft tissues within normal limits.  MRA HEAD FINDINGS  ANTERIOR CIRCULATION:  Visualized portions of the distal cervical segments of the internal carotid arteries are widely patent with antegrade flow. There is moderate atherosclerotic irregularity within the proximal aspect of the petrous right ICA without significant stenosis. The cavernous, and supra clinoid segments are symmetric in caliber bilaterally. A1 segments, anterior communicating artery, and anterior cerebral arteries are widely patent. Middle cerebral arteries are widely patent with antegrade flow without high-grade flow-limiting stenosis or proximal branch occlusion. Distal MCA branches are symmetric bilaterally. No intracranial aneurysm within the anterior circulation.  POSTERIOR CIRCULATION:  The vertebral arteries are widely patent with  antegrade flow. The posterior inferior cerebral arteries are normal. Vertebrobasilar junction and basilar artery are widely patent with antegrade flow without evidence of basilar tip stenosis or aneurysm. Posterior cerebral arteries are normal bilaterally. The superior cerebellar arteries and anterior inferior cerebellar arteries are widely patent bilaterally. No intracranial aneurysm within the posterior circulation.  IMPRESSION: MRI HEAD IMPRESSION:  1. Multiple T2/FLAIR  hyperintense lesions with associated restricted diffusion involving the white matter of both cerebral hemispheres, right greater than left, most consistent with active demyelinating disease. 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 Follow-up examination with postcontrast imaging could be performed for complete evaluation. 2. No other findings to suggest acute intracranial infarct or other process.  MRA HEAD IMPRESSION:  1. Moderate atherosclerotic irregularity within the proximal petrous segment of the right internal carotid artery without associated hemodynamically significant stenosis. 2. Otherwise unremarkable MRA of the brain.   Electronically Signed   By: Jeannine Boga M.D.   On: 10/17/2014 00:43   Mr Jodene Nam Head/brain Wo Cm  10/17/2014   CLINICAL DATA:  Initial evaluation for possible stroke. Patient with dizziness and left-sided weakness. Past medical history of hypertension, tobacco abuse, obesity.  EXAM: MRI HEAD WITHOUT CONTRAST  MRA HEAD WITHOUT CONTRAST  TECHNIQUE: Multiplanar, multiecho pulse sequences of the brain and surrounding structures were obtained without intravenous contrast. Angiographic images of the head were obtained using MRA technique without contrast.  COMPARISON:  Prior CT from 10/16/2014.  FINDINGS: MRI HEAD FINDINGS  Cerebral volume is within normal limits for patient age. Ventricles are normal in size without evidence of hydrocephalus. No extra-axial fluid collection. No midline shift or significant mass effect.  There is a round mass like lesion centered within the deep white matter of the posterior right centrum semi ovale measuring 1.4 x 1.5 x 1.5 cm. This lesion demonstrates alternating concentric rings of hyper and hypo intense T2 signal intensity. This lesion demonstrates peripheral restricted diffusion. Finding is most consistent with probable  tumefactive demyelinating lesion (Balo concentric sclerosis). A second lesion centered in the mid right corona radiata measures 1.0 x 0.9 x 1.0 cm. There is a small subcentimeter lesion present within the deep white matter of the anterior right frontal lobe (series 6, image 89). An additional somewhat more ill-defined lesion with similar signal characteristics seen more anteriorly and inferiorly within the right frontotemporal region at the anterior insular cortex measures 1.2 x 2.1 x 0.8 cm. An additional lesion more inferiorly within the medial right temporal lobe measuring 1.1 x 1.0 cm. Subcentimeter lesions adjacent to the atria of the lateral ventricles present as well, right greater than left. Small periventricular lesions seen more superiorly within the mid left frontal lobe. No infratentorial lesions.  Craniocervical junction within normal limits. Visualized upper cervical spine unremarkable.  Pituitary gland within normal limits. No acute abnormality seen about the orbits.  Calvarium intact.  Scalp soft tissues within normal limits.  MRA HEAD FINDINGS  ANTERIOR CIRCULATION:  Visualized portions of the distal cervical segments of the internal carotid arteries are widely patent with antegrade flow. There is moderate atherosclerotic irregularity within the proximal aspect of the petrous right ICA without significant stenosis. The cavernous, and supra clinoid segments are symmetric in caliber bilaterally. A1 segments, anterior communicating artery, and anterior cerebral arteries are widely patent. Middle cerebral arteries are widely patent with antegrade flow without high-grade flow-limiting stenosis or proximal branch occlusion. Distal MCA branches are symmetric bilaterally.  No intracranial aneurysm within the anterior circulation.  POSTERIOR CIRCULATION:  The vertebral arteries are widely patent with antegrade flow. The posterior inferior cerebral arteries are normal. Vertebrobasilar junction and basilar  artery are widely patent with antegrade flow without evidence of basilar tip stenosis or aneurysm. Posterior cerebral arteries are normal bilaterally. The superior cerebellar arteries and anterior inferior cerebellar arteries are widely patent bilaterally. No intracranial aneurysm within the posterior circulation.  IMPRESSION: MRI HEAD IMPRESSION:  1. Multiple T2/FLAIR hyperintense lesions with associated restricted diffusion involving the white matter of both cerebral hemispheres, right greater than left, most consistent with active demyelinating disease. 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 Follow-up examination with postcontrast imaging could be performed for complete evaluation. 2. No other findings to suggest acute intracranial infarct or other process.  MRA HEAD IMPRESSION:  1. Moderate atherosclerotic irregularity within the proximal petrous segment of the right internal carotid artery without associated hemodynamically significant stenosis. 2. Otherwise unremarkable MRA of the brain.   Electronically Signed   By: Jeannine Boga M.D.   On: 10/17/2014 00:43    Medications:  Scheduled: . aspirin  300 mg Rectal Daily   Or  . aspirin  325 mg Oral Daily  . atorvastatin  40 mg Oral q1800  . enoxaparin (LOVENOX) injection  40 mg Subcutaneous Q24H  . hydrochlorothiazide  12.5 mg Oral Daily  . irbesartan  300 mg Oral Daily  . methylPREDNISolone (SOLU-MEDROL) injection  500 mg Intravenous Q12H  . nicotine  7 mg Transdermal Q24H  . pantoprazole (PROTONIX) IV  40 mg Intravenous Q24H    Assessment/Plan:  43y/o woman admitted with left sided weakness and gait instability. Initial concern for lacunar infarct due to multiple risk factors. 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 There appears to be lesions with active DWI  restriction and some older lesions indicating separation in time and space.   -continue IV solumedrol 500mg  BID x 5 days. (currently day 2). Continue GI prophylaxis -would monitor blood glucose with use of IV steroids -check MRI brain with contrast and MRI C spine with and without -ANA pending -consult IR for LP under fluoro to check CSF for cell count, protein, cryptococcal antigen, Gram stain, IgG index, oligoclonal bands, cytology and flow cytometry -rehab evaluation -will need outpatient neurology follow up upon discharge   LOS: 2 days   Jim Like, DO [facility] [phone]  If 7pm- 7am, please page neurology on call as listed in [place]. 10/18/2014  8:18 AM

## 2014-10-18 NOTE — Progress Notes (Signed)
Family Medicine Teaching Service Daily Progress Note Intern Pager: [phone]  Patient name: [name] Medical record number: [id number] Date of birth: May 05, 1971 Age: 43 y.o. Gender: female  Primary Care Provider: Jenny Reichmann, MD Consultants: neuro, cardiology Code Status: full  Pt Overview and Major Events to Date:  12/3: patient admitted for stroke-like symptoms  Assessment and Plan: [name] is a 43 y.o. female presenting with dizziness and left-sided weakness . PMH is significant for hypertension, tobacco abuse, OSA, obesity.  #Suspected Multiple Sclerosis: Patient presented with complaints of dizziness and left-sided weakness 1 day. These symptoms have improved since onset the night before admission. CT performed at admission showed evidence consistent with possible nonhemorrhagic infarcts. No masses noted. No hypoglycemia. No presyncopal event reported. MRI showed multiple halo concentric sclerotic lesions. Highly suspicious for MS, lesions observed show evidence of differences in time and space. - Neurology following - appreciate the assistance - f/u MRI brain w/ contrast; MRI C Spine w/ w/o - Solumedrol $RemoveBefo'500mg'CtREojIbsUF$  BID x 5days (day #2; DC 12/8 @ PM)  - Protonix for GI prophylaxis - PT/OT - LP w/ labs pending.  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 Symptoms improved currently. - Will monitor - Neuro on board  #OSA: uses this regularly at home.  - continue CPAP qhs  #HTN: 124/81 on admission. Currently controlled on Micardis 80/12.$RemoveBeforeDEI'5mg'fSlAPnyFmdpJyURG$  - will continue home regimen of Micardis  #Anxiety/Depression:  - home medication of Xanax; patient denies any recent sxs or need for medication. Holding home med.  #Tobacco use: States her desire to quit. - will monitor nicotine withdrawal sxs - will provide nicotine replacement PRN - nicotine patch $RemoveBefore'7mg'WvOshZSuXUFxZ$  daily prn  FEN/GI: NS KVO, heart healthy diet after passing swallow  screen Prophylaxis: lovenox   Disposition: home when deemed medically stable  Subjective:  Patient in better spirits today. Says she would like to go home but understands the necessity of staying and wants to be as compliant as possible. Has been ambulating regularly w/o issue. Appetite good. Had good questions about the possible side effects of the LP procedure.  Objective: Temp:  [98.4 F (36.9 C)-98.7 F (37.1 C)] 98.4 F (36.9 C) (12/05 0957) Pulse Rate:  [90-120] 120 (12/05 0957) Resp:  [18-20] 20 (12/05 0957) BP: (125-140)/(70-86) 139/84 mmHg (12/05 0957) SpO2:  [95 %-99 %] 98 % (12/05 0957) Physical Exam: General -- oriented x3, pleasant and cooperative. Obese. HEENT -- Head is normocephalic. Eyes, pupils equal and round and react to light and accommodation. Extraocular motions are intact. Neck -- supple; no bruits. Integument -- intact. No rash, erythema, or ecchymoses.  Chest -- good expansion. Lungs clear to auscultation. Cardiac -- RRR. No murmurs noted.  Abdomen -- soft, nontender. No masses palpable. Normal bowel sounds present. CNS -- cranial nerves II through XII grossly intact. Musculoskeletal - no tenderness or effusions noted. ROM good. 5/5 bilateral strength. Dorsalis pedis pulses present and symmetrical.   Laboratory:  Recent Labs Lab 10/16/14 1417 10/16/14 1557 10/16/14 1619  WBC 10.9* 11.0*  --   HGB 12.4 12.7 14.3  HCT 40.2 39.3 42.0  PLT  --  280  --     Recent Labs Lab 10/16/14 1557 10/16/14 1619  NA 139 140  K 4.1 3.9  CL 102 102  CO2 26  --   BUN 8 7  CREATININE 0.77 0.90  CALCIUM 10.7*  --   PROT 7.7  --   BILITOT 0.2*  --  ALKPHOS 87  --   ALT 24  --   AST 17  --   GLUCOSE 100* 102*    Imaging/Diagnostic Tests: CT head 12/3 IMPRESSION: 1. Small areas of low attenuation in the right frontal lobe and in the right corona radiata concerning for acute versus subacute nonhemorrhagic infarcts.  MRI/MRA 12/4 IMPRESSION: MRI  HEAD IMPRESSION:  1. Multiple T2/FLAIR hyperintense lesions with associated restricted diffusion involving the white matter of both cerebral hemispheres, right greater than left, most consistent with active demyelinating disease. 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 Follow-up examination with postcontrast imaging could be performed for complete evaluation. 2. No other findings to suggest acute intracranial infarct or other process.  MRA HEAD IMPRESSION:  1. Moderate atherosclerotic irregularity within the proximal petrous segment of the right internal carotid artery without associated hemodynamically significant stenosis. 2. Otherwise unremarkable MRA of the brain.   Elberta Leatherwood, MD 10/18/2014, 10:36 AM PGY-1, [place] Intern pager: [phone], text pages welcome

## 2014-10-18 NOTE — Progress Notes (Addendum)
Md made aware of tachycardia

## 2014-10-18 NOTE — Plan of Care (Signed)
Problem: Acute Treatment Outcomes Goal: Hemodynamically stable Outcome: Completed/Met Date Met:  10/18/14 Goal: Prognosis discussed with family/patient as appropriate Outcome: Progressing Goal: Other Acute Treatment Outcomes Outcome: Completed/Met Date Met:  10/18/14

## 2014-10-18 NOTE — Evaluation (Signed)
Speech Language Pathology Evaluation Patient Details Name: [name] MRN: [id number] DOB: 1971/10/29 Today's Date: 10/18/2014 Time: 0100-0120 SLP Time Calculation (min) (ACUTE ONLY): 20 min  Problem List:  Patient Active Problem List   Diagnosis Date Noted  . MS (multiple sclerosis)   . Multiple sclerosis   . Weakness   . Obesity, Class III, BMI 40-49.9 (morbid obesity) 10/16/2014  . Smoker 10/16/2014  . OSA on CPAP 10/16/2014  . Acute CVA (cerebrovascular accident) 10/16/2014  . Stroke 10/16/2014  . Cough 03/19/2013  . HTN (hypertension) 01/14/2012  . Anemia 01/14/2012  . Sickle cell trait 01/14/2012   Past Medical History:  Past Medical History  Diagnosis Date  . Hypertension   . Smoker   . Allergy   . Anemia   . Depression   . Anxiety   . Sickle cell trait   . Iron overload, transfusional   . Sickle cell anemia     trait  . Clotting disorder     from beeing anemic   Past Surgical History:  Past Surgical History  Procedure Laterality Date  . Cervical ablation    . Tubal ligation    . Cholecystectomy    . Cesarean section      3 times   HPI:  Pt is a 43 y.o. female presenting with dizziness and left-sided weakness on 10/16/2014. PMH is significant for hypertension, tobacco abuse, OSA, obesity.; pt denies any difficulty with speech/language at present time. MRA brain without contrast indicated on 12/4 moderate atherosclerotic irregularity within the proximal petrous segment of the right internal carotid artery without associated hemodynamically significant stenosis; otherwise, unremarkable MRA of brain; MRI pending   Assessment / Plan / Recommendation Clinical Impression   Pt exhibited speech/language which was within functional limits for all areas assessed as she was able to follow complex directions, name various items (divergent/convergent), problem-solve, verbally express complex information; Ox4 and executive functioning capabilities were all [facility]; pt did  exhibit a minor deviation with vowel /r/ during conversational attempts intermittently, but overall intelligibility was 100% accurate in conversation.      SLP Assessment  Patient does not need any further Speech Language Pathology Services    Follow Up Recommendations  None    Frequency and Duration   n/a     Pertinent Vitals/Pain Pain Assessment: No/denies pain   SLP Goals  Patient/Family Stated Goal: Go home  SLP Evaluation Prior Functioning  Cognitive/Linguistic Baseline: Within functional limits Type of Home: House  Lives With: Family Available Help at Discharge: Family;Available PRN/intermittently Vocation: Full time employment   Cognition  Overall Cognitive Status: Within Functional Limits for tasks assessed Arousal/Alertness: Awake/alert Orientation Level: Oriented X4 Memory: Appears intact Awareness: Appears intact Problem Solving: Appears intact Safety/Judgment: Appears intact    Comprehension  Auditory Comprehension Overall Auditory Comprehension: Appears within functional limits for tasks assessed Yes/No Questions: Within Functional Limits Commands: Within Functional Limits Visual Recognition/Discrimination Discrimination: Not tested Reading Comprehension Reading Status: Not tested    Expression Expression Primary Mode of Expression: Verbal Verbal Expression Overall Verbal Expression: Appears within functional limits for tasks assessed Initiation: No impairment Level of Generative/Spontaneous Verbalization: Conversation Repetition: No impairment Naming: No impairment Pragmatics: No impairment Non-Verbal Means of Communication: Not applicable Written Expression Dominant Hand: Right Written Expression: Within Functional Limits   Oral / Motor Oral Motor/Sensory Function Overall Oral Motor/Sensory Function: Appears within functional limits for tasks assessed Motor Speech Overall Motor Speech: Appears within functional limits for tasks  assessed Respiration: Within functional limits Phonation: Normal  Resonance: Within functional limits Articulation: Within functional limitis Intelligibility: Intelligible Motor Planning: Witnin functional limits Motor Speech Errors: Not applicable        Alston Berrie,PAT, M.S., CCC-SLP 10/18/2014, 1:38 PM

## 2014-10-18 NOTE — Plan of Care (Signed)
Problem: Progression Outcomes Goal: Progressive activity as tolerated Outcome: Completed/Met Date Met:  10/18/14 Goal: Tolerating diet/TF at goal rate Outcome: Completed/Met Date Met:  10/18/14 Goal: Initial discharge plan initiated Outcome: Completed/Met Date Met:  10/18/14 Goal: Other Progression Outcomes Outcome: Not Applicable Date Met:  20/94/70

## 2014-10-19 DIAGNOSIS — R000 Tachycardia, unspecified: Secondary | ICD-10-CM | POA: Insufficient documentation

## 2014-10-19 DIAGNOSIS — R7303 Prediabetes: Secondary | ICD-10-CM | POA: Insufficient documentation

## 2014-10-19 LAB — GLUCOSE, CAPILLARY
GLUCOSE-CAPILLARY: 248 mg/dL — AB (ref 70–99)
GLUCOSE-CAPILLARY: 155 mg/dL — AB (ref 70–99)
Glucose-Capillary: 168 mg/dL — ABNORMAL HIGH (ref 70–99)

## 2014-10-19 MED ORDER — INSULIN ASPART 100 UNIT/ML ~~LOC~~ SOLN: 0.0000 [IU] | Freq: Every day | SUBCUTANEOUS | Status: DC

## 2014-10-19 MED ORDER — INSULIN ASPART 100 UNIT/ML ~~LOC~~ SOLN
0.0000 [IU] | Freq: Three times a day (TID) | SUBCUTANEOUS | Status: DC
  Administered 2014-10-19: 5 [IU] via SUBCUTANEOUS
  Administered 2014-10-19: 3 [IU] via SUBCUTANEOUS
  Administered 2014-10-20 (×2): 2 [IU] via SUBCUTANEOUS
  Administered 2014-10-20: 3 [IU] via SUBCUTANEOUS
  Administered 2014-10-21 (×2): 2 [IU] via SUBCUTANEOUS
  Administered 2014-10-21: 3 [IU] via SUBCUTANEOUS
  Administered 2014-10-22: 2 [IU] via SUBCUTANEOUS
  Administered 2014-10-22: 3 [IU] via SUBCUTANEOUS

## 2014-10-19 NOTE — Plan of Care (Signed)
Problem: Discharge/Transitional Outcomes Goal: Tolerating diet/TF at goal rate-PEG if inadequate intake Outcome: Completed/Met Date Met:  10/19/14

## 2014-10-19 NOTE — Plan of Care (Signed)
Problem: Acute Treatment Outcomes Goal: Prognosis discussed with family/patient as appropriate Outcome: Completed/Met Date Met:  10/19/14

## 2014-10-19 NOTE — Progress Notes (Signed)
Family Medicine Teaching Service Daily Progress Note Intern Pager: [phone]  Patient name: [name] Medical record number: [id number] Date of birth: [birth year] Age: 43 y.o. Gender: female  Primary Care Provider: Jenny Reichmann, MD Consultants: neuro, cardiology Code Status: full  Pt Overview and Major Events to Date:  12/3: patient admitted for stroke-like symptoms 12/6: MRI concerning for MS on Solumedrol  Assessment and Plan: [name] is a 43 y.o. female presenting with dizziness and left-sided weakness . PMH is significant for hypertension, tobacco abuse, OSA, obesity.  #Suspected Multiple Sclerosis: Patient presented with complaints of dizziness and left-sided weakness 1 day. These symptoms have improved since onset the night before admission.  - Imaging  CT performed at admission showed evidence consistent with possible nonhemorrhagic infarcts.   MR w/o & MRA showed multiple halo concentric sclerotic lesions- Highly suspicious for MS.   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  MR Cspine :no convincing evidence for spinal MS. - Neurology following - Recs  Solumedrol 562m BID x 5 days (day # 3; DC 12/8 @ PM)  Protonix for GI prophylaxis  Monitor Glucose  LP w/ labs: scheduled for Monday - ESR 14 (N); ANA pending - PT/OT: No recs - symptoms resolved  # Tachycardia: Started after Solumedrol; likely related to steroids +/- anxiety. EKG 12/3 showed sinus tachy. Denies CP, SOB - Improving; HR 100 today. 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 Symptoms improved currently. - Will monitor - Neuro on board  # Prediabetes: A1c 6.1 (10/17/14) - Monitor BG in setting of steroids - SSI reg; No meal coverage, basal insulin or HS insulin at this time  #OSA: uses  this regularly at home.  - continue CPAP qhs  #HTN: 124/81 on admission. Currently controlled on Micardis 80/12.513m- will continue home regimen of Micardis  #Anxiety/Depression:  - home medication of Xanax; patient denies any recent sxs or need for medication. Holding home med.  #Tobacco use: States her desire to quit. - will monitor nicotine withdrawal sxs - Pt does not want nicotine patch  FEN/GI: NS KVO, heart healthy diet Prophylaxis: lovenox  Disposition: home when deemed medically stable  Subjective:  Patient denies any dizziness, weakness, or numbness; feel bask to her baseline expect still noticing fast heart rate occasionally. Has been ambulating regularly w/o issue. Appetite good..  Objective: Temp:  [98.2 F (36.8 C)-98.9 F (37.2 C)] 98.3 F (36.8 C) (12/06 0525) Pulse Rate:  [100-124] 100 (12/06 0525) Resp:  [18-20] 18 (12/06 0525) BP: (118-162)/(74-95) 129/74 mmHg (12/06 0525) SpO2:  [94 %-99 %] 99 % (12/06 0525) Physical Exam: General -- oriented x3, pleasant and cooperative. Obese. HEENT -- Head is normocephalic. Eyes, pupils equal and round and react to light and accommodation. Extraocular motions are intact. Neck -- supple; no bruits. Integument -- intact. No rash, erythema, or ecchymoses.  Chest -- good expansion. Lungs clear to auscultation. Cardiac -- RRR. No murmurs noted.  CNS -- cranial nerves II through XII grossly intact. Musculoskeletal - no tenderness or effusions noted. ROM good. 5/5 bilateral strength. Dorsalis pedis pulses present and symmetrical.   Laboratory:  Recent Labs Lab 10/16/14 1417 10/16/14 1557 10/16/14 1619  WBC 10.9* 11.0*  --   HGB 12.4 12.7 14.3  HCT 40.2 39.3 42.0  PLT  --  280  --  Recent Labs Lab 10/16/14 1557 10/16/14 1619  NA 139 140  K 4.1 3.9  CL 102 102  CO2 26  --   BUN 8 7  CREATININE 0.77 0.90  CALCIUM 10.7*  --   PROT 7.7  --   BILITOT 0.2*  --   ALKPHOS 87  --   ALT 24  --   AST 17  --    GLUCOSE 100* 102*    Imaging/Diagnostic Tests: CT head 12/3 IMPRESSION: 1. Small areas of low attenuation in the right frontal lobe and in the right corona radiata concerning for acute versus subacute nonhemorrhagic infarcts.  MRI/MRA 12/4 IMPRESSION: MRI HEAD IMPRESSION:  1. Multiple T2/FLAIR hyperintense lesions with associated restricted diffusion involving the white matter of both cerebral hemispheres, right greater than left, most consistent with active demyelinating disease. 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 Follow-up examination with postcontrast imaging could be performed for complete evaluation. 2. No other findings to suggest acute intracranial infarct or other process.  MRA HEAD IMPRESSION:  1. Moderate atherosclerotic irregularity within the proximal petrous segment of the right internal carotid artery without associated hemodynamically significant stenosis. 2. Otherwise unremarkable MRA of the brain.   Olam Idler, MD 10/19/2014, 8:17 AM PGY-2, [place] Intern pager: [phone], text pages welcome

## 2014-10-19 NOTE — Progress Notes (Signed)
Subjective: No overnight events. Day 3 of IV solumedrol, tolerating well.   MRI brain with contrast shows enhancing lesion, consistent with active MS.   Objective: Current vital signs: BP 129/74 mmHg  Pulse 100  Temp(Src) 98.3 F (36.8 C) (Oral)  Resp 18  Ht 5\' 3"  (1.6 m)  Wt 129.819 kg (286 lb 3.2 oz)  BMI 50.71 kg/m2  SpO2 99%  LMP 09/04/2014 Vital signs in last 24 hours: Temp:  [98.2 F (36.8 C)-98.9 F (37.2 C)] 98.3 F (36.8 C) (12/06 0525) Pulse Rate:  [100-124] 100 (12/06 0525) Resp:  [18-20] 18 (12/06 0525) BP: (118-162)/(74-95) 129/74 mmHg (12/06 0525) SpO2:  [94 %-99 %] 99 % (12/06 0525)  Intake/Output from previous day: 12/05 0701 - 12/06 0700 In: 240 [P.O.:240] Out: -  Intake/Output this shift:   Nutritional status: Diet Heart  Neurologic Exam: Mental Status: Alert, oriented, thought content appropriate. Speech fluent without evidence of aphasia. Able to follow 3 step commands without difficulty. Cranial Nerves: II: funduscopic exam wnl bilaterally, visual fields grossly normal, pupils equal, round, reactive to light and accommodation III,IV, VI: ptosis not present, extra-ocular motions intact bilaterally V,VII: smile symmetric, facial light touch sensation normal bilaterally VIII: hearing normal bilaterally IX,X: gag reflex present XI: trapezius strength/neck flexion strength normal bilaterally XII: tongue strength normal  Motor: 5/5 strength on the right side UE and LE. 5-/5 proximal and distal LUE. 5-/5 proximal LLE to 5/5 distal Tone and bulk:normal tone throughout; no atrophy noted Sensory: Pinprick and light touch intact throughout, bilaterally  Lab Results: Basic Metabolic Panel:  Recent Labs Lab 10/16/14 1557 10/16/14 1619  NA 139 140  K 4.1 3.9  CL 102 102  CO2 26  --   GLUCOSE 100* 102*  BUN 8 7  CREATININE 0.77 0.90  CALCIUM 10.7*  --     Liver Function Tests:  Recent Labs Lab 10/16/14 1557  AST 17  ALT 24  ALKPHOS 87   BILITOT 0.2*  PROT 7.7  ALBUMIN 3.9   No results for input(s): LIPASE, AMYLASE in the last 168 hours. No results for input(s): AMMONIA in the last 168 hours.  CBC:  Recent Labs Lab 10/16/14 1417 10/16/14 1557 10/16/14 1619  WBC 10.9* 11.0*  --   NEUTROABS  --  7.3  --   HGB 12.4 12.7 14.3  HCT 40.2 39.3 42.0  MCV 76.1* 73.6*  --   PLT  --  280  --     Cardiac Enzymes: No results for input(s): CKTOTAL, CKMB, CKMBINDEX, TROPONINI in the last 168 hours.  Lipid Panel:  Recent Labs Lab 10/17/14 0605  CHOL 170  TRIG 185*  HDL 36*  CHOLHDL 4.7  VLDL 37  LDLCALC 97    CBG: No results for input(s): GLUCAP in the last 168 hours.  Microbiology: Results for orders placed or performed during the hospital encounter of 07/05/09  Technologist smear review     Status: None   Collection Time: 07/05/09  3:15 AM  Result Value Ref Range Status   Path Review   Final    POLYCHROMASIA PRESENT LARGE PLATELETS PRESENT TARGET CELLS SPHEROCYTES    Coagulation Studies:  Recent Labs  10/16/14 1557  LABPROT 12.6  INR 0.94    Imaging: Mr Jeri Cos Contrast  10/18/2014   CLINICAL DATA:  LEFT arm and leg weakness. Suspected demyelinating disease. Initial encounter.  EXAM: MRI HEAD WITH CONTRAST  TECHNIQUE: Multiplanar, multiecho pulse sequences of the brain and surrounding structures were obtained with intravenous contrast.  COMPARISON:  MR head 10/16/2014. MRI cervical spine reported separately.  CONTRAST:  44mL MULTIHANCE GADOBENATE DIMEGLUMINE 529 MG/ML IV SOLN  FINDINGS: The area of suspected active demyelination with alternating rings of T2 hyper and T2 hypointensity in the RIGHT posterior frontal subcortical white matter/centrum semiovale displays avid contrast enhancement centrally, and is consistent with an acute MS plaque. Other areas of T2 hyperintensity noted on the previous study from 2 days ago do not show similar blood brain barrier breakdown.  IMPRESSION: Findings consistent  with acute demyelination in the RIGHT posterior frontal subcortical white matter in this patient with suspected multiple sclerosis. Multiple other areas of T2 signal hyperintensity on the previous study do not display similar postcontrast enhancement.  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   Electronically Signed   By: Rolla Flatten M.D.   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 Acute demyelinating lesions intracranially. Evaluate for spinal MS.  EXAM: MRI CERVICAL SPINE WITHOUT AND WITH CONTRAST  TECHNIQUE: Multiplanar and multiecho pulse sequences of the cervical spine, to include the craniocervical junction and cervicothoracic junction, were obtained according to standard protocol without and with intravenous contrast.  CONTRAST:  64mL MULTIHANCE GADOBENATE DIMEGLUMINE 529 MG/ML IV SOLN  COMPARISON:  MRI brain reported separately.  FINDINGS: Motion degraded exam. Overall study is diagnostic for medium to large lesions.  No cord lesions are seen on T2 or STIR imaging. There are no areas of cord enlargement. There are no areas of cord compression or tonsillar herniation. No hydromyelia.  Postcontrast, no abnormal enhancement. Axial images are degraded by motion but show no obvious compressive lesion, disc protrusion, or spinal stenosis.  IMPRESSION: Motion degraded exam demonstrating no convincing evidence for spinal MS.  Certainly there are no areas of T2 hyperintensity or cord enlargement.  No disc protrusion or spinal stenosis.   Electronically Signed   By: Rolla Flatten M.D.   On: 10/18/2014 16:00    Medications:  Scheduled: . aspirin  300 mg Rectal Daily   Or  . aspirin  325 mg Oral Daily  . atorvastatin  40 mg Oral q1800  . enoxaparin (LOVENOX) injection  40 mg  Subcutaneous Q24H  . hydrochlorothiazide  12.5 mg Oral Daily  . irbesartan  300 mg Oral Daily  . methylPREDNISolone (SOLU-MEDROL) injection  500 mg Intravenous Q12H  . pantoprazole  40 mg Oral Daily    Assessment/Plan:  43y/o woman admitted with left sided weakness and gait instability. Initial concern for lacunar infarct due to multiple risk factors. 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 There appears to be lesions with active DWI restriction and some older lesions indicating separation in time and space.   -continue IV solumedrol 500mg  BID x 5 days. (currently day 3). Continue GI prophylaxis -would monitor blood glucose with use of IV steroids -MRI C spine can be completed as outpatient -ANA pending -consult IR for LP under fluoro to check CSF for cell count, protein, cryptococcal antigen, Gram stain, IgG index, oligoclonal bands, cytology and flow cytometry -rehab evaluation -will need outpatient neurology follow up upon discharge   LOS: 3 days   Jim Like, DO [facility] [phone]  If 7pm- 7am, please page neurology on call as listed in [employer]. 10/19/2014  8:23 AM

## 2014-10-20 DIAGNOSIS — R000 Tachycardia, unspecified: Secondary | ICD-10-CM

## 2014-10-20 DIAGNOSIS — R7309 Other abnormal glucose: Secondary | ICD-10-CM

## 2014-10-20 LAB — ANA: ANA: NEGATIVE

## 2014-10-20 LAB — BASIC METABOLIC PANEL
Anion gap: 15 (ref 5–15)
BUN: 23 mg/dL (ref 6–23)
CALCIUM: 10.6 mg/dL — AB (ref 8.4–10.5)
CO2: 25 meq/L (ref 19–32)
CREATININE: 0.81 mg/dL (ref 0.50–1.10)
Chloride: 101 mEq/L (ref 96–112)
GFR calc Af Amer: >90 mL/min (ref >90)
GFR calc non Af Amer: 86 mL/min — ABNORMAL LOW (ref >90)
GLUCOSE: 146 mg/dL — AB (ref 70–99)
Potassium: 4 mEq/L (ref 3.7–5.3)
Sodium: 141 mEq/L (ref 137–147)

## 2014-10-20 LAB — CBC
HCT: 37.7 % (ref 36.0–46.0)
HEMOGLOBIN: 12.1 g/dL (ref 12.0–15.0)
MCH: 23.9 pg — AB (ref 26.0–34.0)
MCHC: 32.1 g/dL (ref 30.0–36.0)
MCV: 74.5 fL — AB (ref 78.0–100.0)
Platelets: 261 10*3/uL (ref 150–400)
RBC: 5.06 MIL/uL (ref 3.87–5.11)
RDW: 16.7 % — ABNORMAL HIGH (ref 11.5–15.5)
WBC: 25.1 10*3/uL — ABNORMAL HIGH (ref 4.0–10.5)

## 2014-10-20 LAB — GLUCOSE, CAPILLARY
GLUCOSE-CAPILLARY: 145 mg/dL — AB (ref 70–99)
Glucose-Capillary: 158 mg/dL — ABNORMAL HIGH (ref 70–99)
Glucose-Capillary: 137 mg/dL — ABNORMAL HIGH (ref 70–99)
Glucose-Capillary: 199 mg/dL — ABNORMAL HIGH (ref 70–99)

## 2014-10-20 NOTE — Progress Notes (Signed)
CARE MANAGEMENT NOTE 10/20/2014  Patient:  [name], [name]   Account Number:  [id number]  Date Initiated:  10/20/2014  Documentation initiated by:  Olga Coaster  Subjective/Objective Assessment:   ADMITTED WITH WEAKNESS, MS     Action/Plan:   CM FOLLOWING FOR DCP   Anticipated DC Date:  10/22/2014   Anticipated DC Plan:  [place]  CM consult        Status of service:  In process, will continue to follow Medicare Important Message given?   (If response is "NO", the following Medicare IM given date fields will be blank)  Per UR Regulation:  Reviewed for med. necessity/level of care/duration of stay  Comments:  12/07/2015Mindi Slicker RN,BSN,MHA [phone]

## 2014-10-20 NOTE — Progress Notes (Signed)
Pt has home CPAP and will place on when ready. Pt to call respiratory if she has any problems.

## 2014-10-20 NOTE — Progress Notes (Signed)
Family Medicine Teaching Service Daily Progress Note Intern Pager: [phone]  Patient name: [name] Medical record number: [id number] Date of birth: 1971/10/22 Age: 43 y.o. Gender: female  Primary Care Provider: Jenny Reichmann, MD Consultants: neuro, cardiology Code Status: full  Pt Overview and Major Events to Date:  12/3: patient admitted for stroke-like symptoms 12/6: MRI concerning for MS on Solumedrol  Assessment and Plan: [name] is a 43 y.o. female presenting with dizziness and left-sided weakness . PMH is significant for hypertension, tobacco abuse, OSA, obesity.  #Suspected Multiple Sclerosis: Patient presented with complaints of dizziness and left-sided weakness 1 day. These symptoms have improved since onset the night before admission.  - Imaging  CT performed at admission showed evidence consistent with possible nonhemorrhagic infarcts.   MR w/o & MRA showed multiple halo concentric sclerotic lesions- Highly suspicious for MS.   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  MR Cspine :no convincing evidence for spinal MS. - Neurology following - Recs  Solumedrol $RemoveBef'500mg'IVxgPcswso$  BID x 5 days (day # 4; DC 12/8 @ PM)  Protonix for GI prophylaxis  Monitor Glucose  LP w/ labs: scheduled for Today - ESR 14 (N); ANA pending - PT/OT: No recs - symptoms resolved  # Tachycardia: Started after Solumedrol; likely related to steroids +/- anxiety. EKG 12/3 showed sinus tachy. Denies CP, SOB - Improving; HR 100 today. 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 Symptoms improved currently. - Will monitor - Neuro on board  # Prediabetes: A1c 6.1 (10/17/14) - Monitor BG in setting of steroids - SSI reg; No meal coverage, basal insulin or HS insulin at this time  #OSA: uses  this regularly at home.  - continue CPAP qhs  #HTN: 124/81 on admission. Currently controlled on Micardis 80/12.$RemoveBeforeDEI'5mg'tPjSBndyyyNuJsBH$  - will continue home regimen of Micardis  #Anxiety/Depression:  - home medication of Xanax; patient denies any recent sxs or need for medication. Holding home med.  #Tobacco use: States her desire to quit. - will monitor nicotine withdrawal sxs - Pt does not want nicotine patch  FEN/GI: NS KVO, heart healthy diet Prophylaxis: lovenox  Disposition: home when deemed medically stable; possible DC today after LP w/ script for PO steroids.  Subjective:  Feeling well. Showered yesterday. Continues to notice some weakness of the left side. Anxious about LP today. Would like to go home when possible, but would like to stay if she experiences any post-LP HA.   Objective: Temp:  [98.1 F (36.7 C)-98.6 F (37 C)] 98.3 F (36.8 C) (12/07 0602) Pulse Rate:  [68-115] 83 (12/07 0602) Resp:  [20] 20 (12/07 0602) BP: (130-151)/(70-99) 151/92 mmHg (12/07 0602) SpO2:  [98 %-99 %] 98 % (12/07 0602) Physical Exam: General -- oriented x3, pleasant and cooperative. Obese. HEENT -- Head is normocephalic. Eyes, pupils equal and round and react to light and accommodation. Extraocular motions are intact. Neck -- supple; no bruits. Integument -- intact. No rash, erythema, or ecchymoses.  Chest -- good expansion. Lungs clear to auscultation. Cardiac -- RRR. No murmurs noted.  CNS -- cranial nerves II through XII grossly intact. Musculoskeletal - no tenderness or effusions noted. ROM good. 5/5 bilateral strength--Lt sided grip strength seems to be slightly weaker than Rt (but would still consider 5/5). Dorsalis pedis pulses present and symmetrical.   Laboratory:  Recent Labs Lab  10/16/14 1417 10/16/14 1557 10/16/14 1619 10/20/14 0625  WBC 10.9* 11.0*  --  25.1*  HGB 12.4 12.7 14.3 12.1  HCT 40.2 39.3 42.0 37.7  PLT  --  280  --  261    Recent Labs Lab 10/16/14 1557  10/16/14 1619 10/20/14 0625  NA 139 140 141  K 4.1 3.9 4.0  CL 102 102 101  CO2 26  --  25  BUN _0 CREATININE 0.77 0.90 0.81  CALCIUM 10.7*  --  10.6*  PROT 7.7  --   --   BILITOT 0.2*  --   --   ALKPHOS 87  --   --   ALT 24  --   --   AST 17  --   --   GLUCOSE 100* 102* 146*    Imaging/Diagnostic Tests: CT head 12/3 IMPRESSION: 1. Small areas of low attenuation in the right frontal lobe and in the right corona radiata concerning for acute versus subacute nonhemorrhagic infarcts.  MRI/MRA 12/4 IMPRESSION: MRI HEAD IMPRESSION:  1. Multiple T2/FLAIR hyperintense lesions with associated restricted diffusion involving the white matter of both cerebral hemispheres, right greater than left, most consistent with active demyelinating disease. 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 Follow-up examination with postcontrast imaging could be performed for complete evaluation. 2. No other findings to suggest acute intracranial infarct or other process.  MRA HEAD IMPRESSION:  1. Moderate atherosclerotic irregularity within the proximal petrous segment of the right internal carotid artery without associated hemodynamically significant stenosis. 2. Otherwise unremarkable MRA of the brain.   Elberta Leatherwood, MD 10/20/2014, 9:01 AM PGY-2, [place] Intern pager: [phone], text pages welcome

## 2014-10-21 ENCOUNTER — Inpatient Hospital Stay (HOSPITAL_COMMUNITY): Payer: 59

## 2014-10-21 LAB — CBC
HCT: 37.3 % (ref 36.0–46.0)
Hemoglobin: 12.2 g/dL (ref 12.0–15.0)
MCH: 24.3 pg — ABNORMAL LOW (ref 26.0–34.0)
MCHC: 32.7 g/dL (ref 30.0–36.0)
MCV: 74.3 fL — AB (ref 78.0–100.0)
Platelets: 250 10*3/uL (ref 150–400)
RBC: 5.02 MIL/uL (ref 3.87–5.11)
RDW: 16.4 % — ABNORMAL HIGH (ref 11.5–15.5)
WBC: 18.6 10*3/uL — ABNORMAL HIGH (ref 4.0–10.5)

## 2014-10-21 LAB — GRAM STAIN

## 2014-10-21 LAB — CRYPTOCOCCAL ANTIGEN, CSF: CRYPTO AG: NEGATIVE

## 2014-10-21 LAB — GLUCOSE, CAPILLARY
Glucose-Capillary: 148 mg/dL — ABNORMAL HIGH (ref 70–99)
Glucose-Capillary: 149 mg/dL — ABNORMAL HIGH (ref 70–99)
Glucose-Capillary: 187 mg/dL — ABNORMAL HIGH (ref 70–99)

## 2014-10-21 LAB — CSF CELL COUNT WITH DIFFERENTIAL
Eosinophils, CSF: 0 % (ref 0–1)
Lymphs, CSF: 13 % — ABNORMAL LOW (ref 40–80)
MONOCYTE-MACROPHAGE-SPINAL FLUID: 10 % — AB (ref 15–45)
RBC Count, CSF: 3545 /mm3 — ABNORMAL HIGH
Segmented Neutrophils-CSF: 77 % — ABNORMAL HIGH (ref 0–6)
Tube #: 4
WBC CSF: 18 /mm3 — AB (ref 0–5)

## 2014-10-21 LAB — PROTEIN, CSF: Total  Protein, CSF: 50 mg/dL — ABNORMAL HIGH (ref 15–45)

## 2014-10-21 LAB — GLUCOSE, CSF: Glucose, CSF: 110 mg/dL — ABNORMAL HIGH (ref 43–76)

## 2014-10-21 NOTE — Evaluation (Addendum)
Physical Therapy Evaluation Patient Details Name: [name] MRN: [id number] DOB: 09/22/1971 Today's Date: 10/21/2014   History of Present Illness  43 y.o. female admitted to [facility] on 10/16/14 for left sided weakness.  Suspected stroke, but imaging reveals a demylenating process suspicious for MS.  Pt with significant PMHx of HTN, anemia, depression, anxiety, and  sickle cell anemia.  Clinical Impression  **Pt had PT eval 10/17/14 during which she was independent in mobility. New PT order today to address high level balance. Performed Berg Balance Assessment today, pt had score of 39/55 which indicates significant fall risk. Pt had loss of balance with looking up while walking and required assist to prevent fall. With basic mobility she did not have loss of balance, she is able to reach to floor, turn 360* in standing, and stand with eyes closed without loss of balance. Instructed pt in balance exercises to do at home and encouraged her to go to outpt Neuro Rehab for high level balance exercises. Pt advised to avoid quick turns. She declined trial of a cane for extra support with walking.  Noted that PT order was discontinued during PT eval. *    Follow Up Recommendations Outpatient PT    Equipment Recommendations  None recommended by PT (pt declined trial of cane, she wants to remain independent with mobility)    Recommendations for Other Services       Precautions / Restrictions Precautions Precautions: Fall Precaution Comments: pt denies h/o falls Restrictions Weight Bearing Restrictions: No      Mobility  Bed Mobility Overal bed mobility: Independent                Transfers Overall transfer level: Independent Equipment used: None                Ambulation/Gait Ambulation/Gait assistance: Independent Ambulation Distance (Feet): 200 Feet Assistive device: None Gait Pattern/deviations: Step-through pattern Gait velocity: decreased Gait velocity interpretation:  Below normal speed for age/gender General Gait Details: Subjectively, pt reports her gait is slower and more cautious than normal, but no external assist needed and no signs of buckling in her left leg.    Stairs            Wheelchair Mobility    Modified Rankin (Stroke Patients Only)       Balance     Sitting balance-Leahy Scale: Good       Standing balance-Leahy Scale: Good (able to stop/start quickly with walking, turn head to sides walking, reach to floor, turn 360* without LOB, Had LOB with looking up while walking. Unable to perform tandem stance. )                   Standardized Balance Assessment Standardized Balance Assessment : Berg Balance Test Berg Balance Test Sit to Stand: Able to stand without using hands and stabilize independently Standing Unsupported: Able to stand safely 2 minutes Sitting with Back Unsupported but Feet Supported on Floor or Stool: Able to sit safely and securely 2 minutes Stand to Sit: Sits safely with minimal use of hands Transfers: Able to transfer safely, minor use of hands Standing Unsupported with Eyes Closed: Able to stand 10 seconds safely Standing Ubsupported with Feet Together: Needs help to attain position but able to stand for 30 seconds with feet together From Standing, Reach Forward with Outstretched Arm: Can reach forward >12 cm safely (5") From Standing Position, Pick up Object from Floor: Able to pick up shoe, needs supervision From Standing Position,   Turn to Look Behind Over each Shoulder: Looks behind one side only/other side shows less weight shift Turn 360 Degrees: Able to turn 360 degrees safely in 4 seconds or less Standing Unsupported, Alternately Place Feet on Step/Stool: Needs assistance to keep from falling or unable to try Standing Unsupported, One Foot in Front: Loses balance while stepping or standing Standing on One Leg: Tries to lift leg/unable to hold 3 seconds but remains standing  independently Total Score: 39         Pertinent Vitals/Pain Pain Assessment: No/denies pain    Home Living Family/patient expects to be discharged to:: Private residence Living Arrangements: Children Available Help at Discharge: Family;Available PRN/intermittently Type of Home: House Home Access: Stairs to enter     Home Layout: Multi-level Home Equipment: None      Prior Function Level of Independence: Independent         Comments: 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     Hand Dominance   Dominant Hand: Right    Extremity/Trunk Assessment   Upper Extremity Assessment: Overall WFL for tasks assessed           Lower Extremity Assessment: Overall WFL for tasks assessed   LLE Deficits / Details: BLEs testing at 5/5, sensation intact to light touch BLEs  Cervical / Trunk Assessment: Normal  Communication   Communication: No difficulties  Cognition Arousal/Alertness: Awake/alert Behavior During Therapy: WFL for tasks assessed/performed Overall Cognitive Status: Within Functional Limits for tasks assessed                      General Comments      Exercises General Exercises - Lower Extremity Hip ABduction/ADduction: AROM;Both;10 reps;Standing Toe Raises: AROM;Both;10 reps;Standing Heel Raises: AROM;Both;10 reps;Standing      Assessment/Plan    PT Assessment All further PT needs can be met in the next venue of care  PT Diagnosis Difficulty walking   PT Problem List Decreased balance  PT Treatment Interventions     PT Goals (Current goals can be found in the Care Plan section) Acute Rehab PT Goals Patient Stated Goal: to be independent without assistive device or having to rely on others for help PT Goal Formulation: All assessment and education complete, DC therapy    Frequency     Barriers to discharge        Co-evaluation               End of Session Equipment Utilized During Treatment: Gait belt Activity  Tolerance: Patient limited by fatigue Patient left: in bed;with call bell/phone within reach (seated EOB) Nurse Communication: Mobility status         Time: 1329-1400 PT Time Calculation (min) (ACUTE ONLY): 31 min   Charges:   PT Evaluation $Initial PT Evaluation Tier I: 1 Procedure PT Treatments $Therapeutic Activity: 23-37 mins   PT G Codes:          ,  [place] 10/21/2014, 2:14 PM  [phone]   

## 2014-10-21 NOTE — Progress Notes (Addendum)
FMTS Attending Daily Note:  S:  Patient doing well this AM.  Eating and drinking well.  Only unsteady when tries to walk and turn quickly or take small steps  O:   Gen:  AAF sitting on side of bed, NAD Heart: RRR Lungs:  Clear Neuro:  Strength 5/5 BL feet.  Sensation good. Refuses heel-to-toe due to anxiety about falling.  Romberg negative.    Imp/Plan: 1.  Neuro: - Neurology on board, appreciate recommendations. - needs LP, planned for today.   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  Ring-enhancing lesions.  Much less likely an infectious process.   - Agree with solumedrol.  Acutely elevated leukocytosis --> likely 2/2 solumedrol.  Repeating CBC today to follow trends.  - Await LP results. - needs PT consult.  Still somewhat unsteady on her feet.  Do not want her falling at home.  Alveda Reasons, MD 10/21/2014 9:36 AM

## 2014-10-21 NOTE — Progress Notes (Signed)
Patient has home cpap in room and is able to use on her own

## 2014-10-21 NOTE — Progress Notes (Signed)
Subjective: Feels "great".  She states she has no residual weakness.   Has two more doses Solumedrol to scheduled.   Objective: Current vital signs: BP 138/82 mmHg  Pulse 72  Temp(Src) 97.9 F (36.6 C) (Oral)  Resp 16  Ht 5\' 3"  (1.6 m)  Wt 129.819 kg (286 lb 3.2 oz)  BMI 50.71 kg/m2  SpO2 99%  LMP 09/04/2014 Vital signs in last 24 hours: Temp:  [97.9 F (36.6 C)-98.4 F (36.9 C)] 97.9 F (36.6 C) (12/08 3785) Pulse Rate:  [67-73] 72 (12/08 0638) Resp:  [16-17] 16 (12/08 8850) BP: (130-151)/(66-101) 138/82 mmHg (12/08 0638) SpO2:  [99 %-100 %] 99 % (12/08 2774)  Intake/Output from previous day: 12/07 0701 - 12/08 0700 In: 74 [IV Piggyback:378] Out: -  Intake/Output this shift:   Nutritional status: Diet Heart  Neurologic Exam: General: NAD Mental Status: Alert, oriented, thought content appropriate.  Speech fluent without evidence of aphasia.  Able to follow 3 step commands without difficulty. Cranial Nerves: II: Discs flat bilaterally; Visual fields grossly normal, pupils equal, round, reactive to light and accommodation III,IV, VI: ptosis not present, extra-ocular motions intact bilaterally V,VII: smile symmetric, facial light touch sensation normal bilaterally VIII: hearing normal bilaterally IX,X: gag reflex present XI: bilateral shoulder shrug XII: midline tongue extension without atrophy or fasciculations  Motor: Right : Upper extremity   5/5    Left:     Upper extremity   5-/5  Lower extremity   5/5     Lower extremity   5-/5 proximal Tone and bulk:normal tone throughout; no atrophy noted Sensory: Pinprick and light touch intact throughout, bilaterally Deep Tendon Reflexes:  1+ throughout  Plantars: Right: downgoing   Left: downgoing    Lab Results: Basic Metabolic Panel:  Recent Labs Lab 10/16/14 1557 10/16/14 1619 10/20/14 0625  NA 139 140 141  K 4.1 3.9 4.0  CL 102 102 101  CO2 26  --  25  GLUCOSE 100* 102* 146*  BUN 8 7 23    CREATININE 0.77 0.90 0.81  CALCIUM 10.7*  --  10.6*    Liver Function Tests:  Recent Labs Lab 10/16/14 1557  AST 17  ALT 24  ALKPHOS 87  BILITOT 0.2*  PROT 7.7  ALBUMIN 3.9   No results for input(s): LIPASE, AMYLASE in the last 168 hours. No results for input(s): AMMONIA in the last 168 hours.  CBC:  Recent Labs Lab 10/16/14 1417 10/16/14 1557 10/16/14 1619 10/20/14 0625  WBC 10.9* 11.0*  --  25.1*  NEUTROABS  --  7.3  --   --   HGB 12.4 12.7 14.3 12.1  HCT 40.2 39.3 42.0 37.7  MCV 76.1* 73.6*  --  74.5*  PLT  --  280  --  261    Cardiac Enzymes: No results for input(s): CKTOTAL, CKMB, CKMBINDEX, TROPONINI in the last 168 hours.  Lipid Panel:  Recent Labs Lab 10/17/14 0605  CHOL 170  TRIG 185*  HDL 36*  CHOLHDL 4.7  VLDL 37  LDLCALC 97    CBG:  Recent Labs Lab 10/19/14 2134 10/20/14 0634 10/20/14 1158 10/20/14 1712 10/20/14 2048  GLUCAP 155* 145* 158* 137* 199*    Microbiology: Results for orders placed or performed during the hospital encounter of 07/05/09  Technologist smear review     Status: None   Collection Time: 07/05/09  3:15 AM  Result Value Ref Range Status   Path Review   Final    POLYCHROMASIA PRESENT LARGE PLATELETS PRESENT TARGET CELLS  SPHEROCYTES    Coagulation Studies: No results for input(s): LABPROT, INR in the last 72 hours.  Imaging: No results found.  Medications:  Scheduled: . aspirin  300 mg Rectal Daily   Or  . aspirin  325 mg Oral Daily  . atorvastatin  40 mg Oral q1800  . hydrochlorothiazide  12.5 mg Oral Daily  . insulin aspart  0-15 Units Subcutaneous TID WC  . insulin aspart  0-5 Units Subcutaneous QHS  . irbesartan  300 mg Oral Daily  . methylPREDNISolone (SOLU-MEDROL) injection  500 mg Intravenous Q12H  . pantoprazole  40 mg Oral Daily    Assessment/Plan:  43y/o woman admitted with left sided weakness and gait instability. Initial concern for lacunar infarct due to multiple risk factors. 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 There appears to be lesions with active DWI restriction and some older lesions indicating separation in time and space.   -continue IV solumedrol 500mg  BID x 5 days. (currently day 5). Continue GI prophylaxis -would monitor blood glucose with use of IV steroids -IR for LP to check CSF for cell count, protein, cryptococcal antigen, Gram stain, IgG index, oligoclonal bands, cytology and flow cytometry -will need out patient neurology follow up on discharge.   Etta Quill PA-C [facility] [phone]  10/21/2014, 10:05 AM

## 2014-10-21 NOTE — Progress Notes (Signed)
Family Medicine Teaching Service Daily Progress Note Intern Pager: [phone]  Patient name: [name] Medical record number: [id number] Date of birth: 1971-09-25 Age: 43 y.o. Gender: female  Primary Care Provider: Jenny Reichmann, MD Consultants: neuro, cardiology Code Status: full  Pt Overview and Major Events to Date:  12/3: patient admitted for stroke-like symptoms 12/6: MRI concerning for MS on Solumedrol  Assessment and Plan: [name] is a 43 y.o. female presenting with dizziness and left-sided weakness . PMH is significant for hypertension, tobacco abuse, OSA, obesity.  #Suspected Multiple Sclerosis: Patient presented with complaints of dizziness and left-sided weakness 1 day. These symptoms have improved since onset the night before admission.  - Imaging  CT performed at admission showed evidence consistent with possible nonhemorrhagic infarcts.   MR w/o & MRA showed multiple halo concentric sclerotic lesions- Highly suspicious for MS.   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  MR Cspine :no convincing evidence for spinal MS. - Neurology following - Recs  Solumedrol $RemoveBef'500mg'BtAXMIzmoy$  BID x 5 days (day # 4; DC 12/8 @ PM)  Protonix for GI prophylaxis  Monitor Glucose  LP w/ labs: scheduled for Today - ESR 14 (N); ANA negative - PT/OT: No recs - symptoms resolved  # Tachycardia: Started after Solumedrol; likely related to steroids +/- anxiety. EKG 12/3 showed sinus tachy. Denies CP, SOB - Improving; HR 100 today. 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 Symptoms improved currently. - Will monitor - Neuro on board  # Prediabetes: A1c 6.1 (10/17/14) - Monitor BG in setting of steroids - SSI reg; No meal coverage, basal insulin or HS insulin at this time  #OSA: uses  this regularly at home.  - continue CPAP qhs  #HTN: 124/81 on admission. Currently controlled on Micardis 80/12.$RemoveBeforeDEI'5mg'aJmHznBgXhApqUuT$  - will continue home regimen of Micardis  #Anxiety/Depression:  - home medication of Xanax; patient denies any recent sxs or need for medication. Holding home med.  #Tobacco use: States her desire to quit. - will monitor nicotine withdrawal sxs - Pt does not want nicotine patch  FEN/GI: NS KVO, heart healthy diet Prophylaxis: lovenox  Disposition: home when deemed medically stable; possible DC today after LP w/ script for PO steroids.  Subjective:  Feeling well. No issues overnight. Looking forward to DC. Anticipating LP today.  Objective: Temp:  [97.9 F (36.6 C)-98.4 F (36.9 C)] 97.9 F (36.6 C) (12/08 2353) Pulse Rate:  [67-73] 72 (12/08 0638) Resp:  [16-17] 16 (12/08 0638) BP: (130-151)/(66-101) 138/82 mmHg (12/08 0638) SpO2:  [99 %-100 %] 99 % (12/08 6144) Physical Exam: General -- oriented x3, pleasant and cooperative. Obese. HEENT -- Head is normocephalic. Eyes, pupils equal and round and react to light and accommodation. Extraocular motions are intact. Neck -- supple; no bruits. Integument -- intact. No rash, erythema, or ecchymoses.  Chest -- good expansion. Lungs clear to auscultation. Cardiac -- RRR. No murmurs noted.  CNS -- cranial nerves II through XII grossly intact. Musculoskeletal - no tenderness or effusions noted. ROM good. 5/5 bilateral strength--Lt sided grip strength seems to be slightly weaker than Rt (but would still consider 5/5). Dorsalis pedis pulses present and symmetrical. Gait appears normal.   Laboratory:  Recent Labs Lab 10/16/14 1417 10/16/14 1557 10/16/14 1619 10/20/14 0625  WBC 10.9* 11.0*  --  25.1*  HGB 12.4 12.7 14.3  12.1  HCT 40.2 39.3 42.0 37.7  PLT  --  280  --  261    Recent Labs Lab 10/16/14 1557 10/16/14 1619 10/20/14 0625  NA 139 140 141  K 4.1 3.9 4.0  CL 102 102 101  CO2 26  --  25  BUN $Re'8 7  23  'xan$ CREATININE 0.77 0.90 0.81  CALCIUM 10.7*  --  10.6*  PROT 7.7  --   --   BILITOT 0.2*  --   --   ALKPHOS 87  --   --   ALT 24  --   --   AST 17  --   --   GLUCOSE 100* 102* 146*    Imaging/Diagnostic Tests: CT head 12/3 IMPRESSION: 1. Small areas of low attenuation in the right frontal lobe and in the right corona radiata concerning for acute versus subacute nonhemorrhagic infarcts.  MRI/MRA 12/4 IMPRESSION: MRI HEAD IMPRESSION:  1. Multiple T2/FLAIR hyperintense lesions with associated restricted diffusion involving the white matter of both cerebral hemispheres, right greater than left, most consistent with active demyelinating disease. 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 Follow-up examination with postcontrast imaging could be performed for complete evaluation. 2. No other findings to suggest acute intracranial infarct or other process.  MRA HEAD IMPRESSION:  1. Moderate atherosclerotic irregularity within the proximal petrous segment of the right internal carotid artery without associated hemodynamically significant stenosis. 2. Otherwise unremarkable MRA of the brain.   Elberta Leatherwood, MD 10/21/2014, 12:12 PM PGY-1, [place] Intern pager: [phone], text pages welcome

## 2014-10-21 NOTE — Progress Notes (Addendum)
CRITICAL VALUE ALERT  Critical value received:  (CSF) WBC count 18  Date of notification:  10/21/14  Time of notification:  1841  Critical value read back:Yes.    Nurse who received alert:  Shara Blazing, RN  MD notified (1st page):  Hensel  Time of first page:  1841  MD notified (2nd page): Hensel  Time of second page: 1930  Responding MD:  Andria Frames  Time MD responded:  [phone]

## 2014-10-22 LAB — CBC
HEMATOCRIT: 36.3 % (ref 36.0–46.0)
HEMOGLOBIN: 11.8 g/dL — AB (ref 12.0–15.0)
MCH: 23.8 pg — AB (ref 26.0–34.0)
MCHC: 32.5 g/dL (ref 30.0–36.0)
MCV: 73.3 fL — ABNORMAL LOW (ref 78.0–100.0)
Platelets: 229 10*3/uL (ref 150–400)
RBC: 4.95 MIL/uL (ref 3.87–5.11)
RDW: 16.3 % — ABNORMAL HIGH (ref 11.5–15.5)
WBC: 17.8 10*3/uL — ABNORMAL HIGH (ref 4.0–10.5)

## 2014-10-22 LAB — GLUCOSE, CAPILLARY
GLUCOSE-CAPILLARY: 166 mg/dL — AB (ref 70–99)
Glucose-Capillary: 143 mg/dL — ABNORMAL HIGH (ref 70–99)

## 2014-10-22 MED ORDER — ATORVASTATIN CALCIUM 40 MG PO TABS: 40.0000 mg | ORAL_TABLET | Freq: Every day | ORAL | Status: DC

## 2014-10-22 MED ORDER — ASPIRIN EC 81 MG PO TBEC: 81.0000 mg | DELAYED_RELEASE_TABLET | Freq: Every day | ORAL | Status: DC

## 2014-10-22 NOTE — Plan of Care (Signed)
Problem: Phase I Progression Outcomes Goal: Voiding-avoid urinary catheter unless indicated Outcome: Completed/Met Date Met:  10/22/14     

## 2014-10-22 NOTE — Progress Notes (Signed)
Came to visit patient on behalf of Link to [facility] Care Management program for [facility] employees/dependents with [facility] insurance. Spoke with patient at bedside who endorses she does have HTN which is a criteria for Link to [employer]. However, she is more concerned with her new diagnosis of MS. Agreeable to a post discharge call,. Also states she will take the packet and look it over and may call in the future for an appointment once she gets settled and get a better grasp on her new diagnosis. Made her aware that [facility] could also provide support and assist with community resources and the like. Confirmed best contact number for patient. Will contact post discharge to see how she is doing. Left packet and contact information at bedside. Appreciative of visit. Made inpatient RNCM aware prior to bedside visit.  [facility] [phone]

## 2014-10-22 NOTE — Plan of Care (Signed)
Problem: Phase II Progression Outcomes Goal: Vital signs remain stable Outcome: Completed/Met Date Met:  10/22/14     

## 2014-10-22 NOTE — Progress Notes (Signed)
Reviewed documentation and agree w/ assessment and plan. Lailynn Southgate, MD MPH 

## 2014-10-22 NOTE — Progress Notes (Signed)
Talked to patient about Outpatient physical therapy and patient is agreeable and request the [facility]; orders / clinical information faxed to the Outpatient rehab center- they will contact the patient at home for a start up date and time of therapyAneta Mins [phone]

## 2014-10-22 NOTE — Discharge Summary (Signed)
[facility] Discharge Summary  Patient name: [name] Medical record number: [id number] Date of birth: 08-12-1971 Age: 43 y.o. Gender: female Date of Admission: 10/16/2014  Date of Discharge: 10/22/14 Admitting Physician: Zigmund Gottron, MD  Primary Care Provider: Jenny Reichmann, MD Consultants: neuro  Indication for Hospitalization: Left-sided weakness  Discharge Diagnoses/Problem List:  Multiple sclerosis, Tumefactive  (suspected)  Disposition: Home  Discharge Condition: Stable  Brief Hospital Course:  Patient is a 43 year old female who presented with a one day history of dizziness and left-sided weakness. Patient stated that late the night of admission she noticed she become slightly off balance and dizzy. When she woke the following morning she noticed a sensation of heaviness in her left leg and arm. While at work she had one of her coworkers assess her condition (she works in the Physicist, medical). She was instructed to report to the ED at that time. While in the ED she continued to have some heaviness of her left extremities. She has never experienced these symptoms in the past. She denied any fever, diaphoresis, chest pain, shortness of breath, vision changes, vomiting, nausea, diarrhea, facial droop, blurry vision, eye pain, or headaches.  In the ED CT was obtained. This showed nonspecific areas of attenuation concerning for acute versus subacute nonhemorrhagic infarct. At that time patient was admitted to the hospital under the care of family medicine teaching service. Neurology was consulted.  MRI/MRA was obtained after admission. These showed no evidence of infarct, instead showed in concern for concentric sclerotic changes seemingly separated by both space and time. These are consistent with the diagnosis of multiple sclerosis. Patient was immediately placed on high-dose IV steroids, 500 mg Solu-Medrol for the next 5 days.   Patient  responded well to high-dose steroids. Symptoms began to diminish over the next 48-72 hours. Patient was informed of this possible diagnosis. It was deemed necessary to obtain a lumbar puncture in order to test for oligoclonal bands as well as a IgG index, for an assured diagnosis of MS. Lumbar puncture obtained under fluoroscopy.  Patient's CBGs were watched closely. Prior to admission she was noted to have pre-diabetic labs. High-dose steroids eventually brought her into the category of type 2 diabetes. Patient was treated with sliding scale insulin. Patient will likely need to have this followed up with her primary care provider.  After patient's lumbar puncture patient was deemed medically stable for discharge. Patient no further issues throughout her stay. She was discharged home with follow-up scheduled for both her primary care provider and neurology. I wish her best in the future.   Issues for Follow Up:  - Outpatient PT has NOT been ordered. Will leave this to PCP. - Glycemic control. High dose steroids elevated CBGs significantly. Worth following up by PCP (especially if placed on any steroids in the future). - Follow-up oligoclonal bands and CSF IgG index  Significant Procedures: Lumbar puncture  Significant Labs and Imaging:   Recent Labs Lab 10/20/14 0625 10/21/14 1410 10/22/14 0406  WBC 25.1* 18.6* 17.8*  HGB 12.1 12.2 11.8*  HCT 37.7 37.3 36.3  PLT 261 250 229    Recent Labs Lab 10/20/14 0625  NA 141  K 4.0  CL 101  CO2 25  GLUCOSE 146*  BUN 23  CREATININE 0.81  CALCIUM 10.6*   CSF protein 50 CSF glucose 110 CSF culture: No growth to date  CT head 12/3 IMPRESSION: 1. Small areas of low attenuation in the right frontal lobe and  in the right corona radiata concerning for acute versus subacute nonhemorrhagic infarcts.  MRI/MRA head, without contrast 12/4 IMPRESSION: MRI HEAD IMPRESSION: 1. Multiple T2/FLAIR hyperintense lesions with associated  restricted diffusion involving the white matter of both cerebral hemispheres, right greater than left, most consistent with active demyelinating disease. 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 Follow-up examination with postcontrast imaging could be performed for complete evaluation. 2. No other findings to suggest acute intracranial infarct or other process. MRA HEAD IMPRESSION: 1. Moderate atherosclerotic irregularity within the proximal petrous segment of the right internal carotid artery without associated hemodynamically significant stenosis. 2. Otherwise unremarkable MRA of the brain.  MRI C-spine 12/5 IMPRESSION: Motion degraded exam demonstrating no convincing evidence for spinal MS.  Certainly there are no areas of T2 hyperintensity or cord enlargement.  No disc protrusion or spinal stenosis.   MRI brain, with contrast, 12/5 IMPRESSION: Findings consistent with acute demyelination in the RIGHT posterior frontal subcortical white matter in this patient with suspected multiple sclerosis. Multiple other areas of T2 signal hyperintensity on the previous study do not display similar postcontrast enhancement.  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   Results/Tests Pending at Time of Discharge:  Oligoclonal bands CSF IgG index  Discharge Medications:    Medication List    TAKE these medications        ALPRAZolam 0.5 MG tablet  Commonly known as:  XANAX  Take 1 tablet (0.5 mg total) by mouth 2 (two) times daily as needed for anxiety. Take up to two tablets daily for anxiety     aspirin EC 81 MG tablet  Take 1 tablet (81 mg total) by mouth daily.     atorvastatin 40 MG  tablet  Commonly known as:  LIPITOR  Take 1 tablet (40 mg total) by mouth daily at 6 PM.     calcium carbonate 500 MG chewable tablet  Commonly known as:  TUMS - dosed in mg elemental calcium  Chew 1 tablet by mouth as needed for indigestion or heartburn.     ergocalciferol 50000 UNITS capsule  Commonly known as:  DRISDOL  Take 1 capsule (50,000 Units total) by mouth once a week.     furosemide 20 MG tablet  Commonly known as:  LASIX  Take 20 mg by mouth as needed for fluid.     ibuprofen 800 MG tablet  Commonly known as:  ADVIL,MOTRIN  Take 800 mg by mouth every 8 (eight) hours as needed.     rizatriptan 10 MG tablet  Commonly known as:  MAXALT  Take 1 tablet (10 mg total) by mouth as needed for migraine. May repeat in 2 hours if needed     telmisartan-hydrochlorothiazide 80-12.5 MG per tablet  Commonly known as:  MICARDIS HCT  One daily        Discharge Instructions: Please refer to Patient Instructions section of EMR for full details.  Patient was counseled important signs and symptoms that should prompt return to medical care, changes in medications, dietary instructions, activity restrictions, and follow up appointments.   Follow-Up Appointments: Follow-up Information    Follow up with Andrey Spearman, MD On 10/29/2014.   Specialties:  Neurology, Radiology   Why:  @ 10:30am   Contact information:   [place]  [address] [phone]       Follow up with Jenny Reichmann, MD On 10/27/2014.   Specialty:  Family Medicine   Why:  @12 :45pm   Contact information:   [place] [address] [phone]       Follow up with [facility].   Specialty:  Rehabilitation   Why:  thye will call you with a start up date and time for your physical therapy   Contact information:   [address] [place] [phone]      Elberta Leatherwood,  MD 10/24/2014, 5:38 PM PGY-1, [place]

## 2014-10-22 NOTE — Plan of Care (Signed)
Problem: Phase I Progression Outcomes Goal: OOB as tolerated unless otherwise ordered Outcome: Adequate for Discharge

## 2014-10-22 NOTE — Progress Notes (Signed)
Family Medicine Teaching Service Daily Progress Note Intern Pager: [phone]  Patient name: [name] Medical record number: [id number] Date of birth: 07/11/1971 Age: 43 y.o. Gender: female  Primary Care Provider: Jenny Reichmann, MD Consultants: neuro, cardiology Code Status: full  Pt Overview and Major Events to Date:  12/3: patient admitted for stroke-like symptoms 12/6: MRI concerning for MS on Solumedrol 12/9: solumedrol dose completed  Assessment and Plan: [name] is a 43 y.o. female presenting with dizziness and left-sided weakness . PMH is significant for hypertension, tobacco abuse, OSA, obesity.  #Suspected Multiple Sclerosis: Patient presented with complaints of dizziness and left-sided weakness 1 day. These symptoms have improved since onset the night before admission.  - Imaging  CT performed at admission showed evidence consistent with possible nonhemorrhagic infarcts.   MR w/o & MRA showed multiple halo concentric sclerotic lesions- Highly suspicious for MS.   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  MR Cspine :no convincing evidence for spinal MS. - Neurology following - Recs  Solumedrol $RemoveBef'500mg'nBuqcdoEjM$  BID x 5 days (COMPLETED)  Protonix for GI prophylaxis  Monitor Glucose  LP w/ labs: glucose 110, protein 50, pink/hazy, RBC 3545, WBC 18, seg neutro 77, lymphs 17, mono/macro 10, cryptococcal nagative  IgG, Oligoclonal, and cytology pending - ESR 14 (N); ANA negative - PT/OT: No recs - symptoms resolved  # Tachycardia: Started after Solumedrol; likely related to steroids +/- anxiety. EKG 12/3 showed sinus tachy. Denies CP, SOB - Improving; HR 100 today. 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 Symptoms improved currently. - Will monitor - Neuro on  board  # Prediabetes: A1c 6.1 (10/17/14) - Monitor BG in setting of steroids - SSI reg; No meal coverage, basal insulin or HS insulin at this time  #OSA: uses this regularly at home.  - continue CPAP qhs  #HTN: 124/81 on admission. Currently controlled on Micardis 80/12.$RemoveBeforeDEI'5mg'jhEROlDPEeROLMxO$  - will continue home regimen of Micardis  #Anxiety/Depression:  - home medication of Xanax; patient denies any recent sxs or need for medication. Holding home med.  #Tobacco use: States her desire to quit. - will monitor nicotine withdrawal sxs - Pt does not want nicotine patch  FEN/GI: NS KVO, heart healthy diet Prophylaxis: lovenox  Disposition: DC later today.  Subjective:  Feeling well. No issues overnight. Tolerated LP well, no complications. Looking forward to DC.   Objective: Temp:  [98.3 F (36.8 C)-98.8 F (37.1 C)] 98.6 F (37 C) (12/09 8657) Pulse Rate:  [62-104] 63 (12/09 0611) Resp:  [16-18] 18 (12/09 0611) BP: (106-142)/(69-88) 130/69 mmHg (12/09 0611) SpO2:  [96 %-99 %] 98 % (12/09 8469) Physical Exam: General -- oriented x3, pleasant and cooperative. Obese. HEENT -- Head is normocephalic. Eyes, pupils equal and round and react to light and accommodation. Extraocular motions are intact. Neck -- supple; no bruits. Integument -- intact. No rash, erythema, or ecchymoses. LP site w/o drainage or signs of infxn. Chest -- good expansion. Lungs clear to auscultation. Cardiac -- RRR. No murmurs noted.  CNS -- cranial nerves II through XII grossly intact. Musculoskeletal - no tenderness or effusions noted. ROM good. 5/5 bilateral strength--Lt sided grip strength seems to be slightly weaker than Rt (but would still consider 5/5). Dorsalis pedis pulses present and symmetrical. Gait appears normal.   Laboratory:  Recent Labs Lab 10/20/14 0625 10/21/14 1410  10/22/14 0406  WBC 25.1* 18.6* 17.8*  HGB 12.1 12.2 11.8*  HCT 37.7 37.3 36.3  PLT 261 250 229    Recent Labs Lab 10/16/14 1557  10/16/14 1619 10/20/14 0625  NA 139 140 141  K 4.1 3.9 4.0  CL 102 102 101  CO2 26  --  25  BUN $Re'8 7 23  'YMK$ CREATININE 0.77 0.90 0.81  CALCIUM 10.7*  --  10.6*  PROT 7.7  --   --   BILITOT 0.2*  --   --   ALKPHOS 87  --   --   ALT 24  --   --   AST 17  --   --   GLUCOSE 100* 102* 146*    Imaging/Diagnostic Tests: CT head 12/3 IMPRESSION: 1. Small areas of low attenuation in the right frontal lobe and in the right corona radiata concerning for acute versus subacute nonhemorrhagic infarcts.  MRI/MRA 12/4 IMPRESSION: MRI HEAD IMPRESSION:  1. Multiple T2/FLAIR hyperintense lesions with associated restricted diffusion involving the white matter of both cerebral hemispheres, right greater than left, most consistent with active demyelinating disease. 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 Follow-up examination with postcontrast imaging could be performed for complete evaluation. 2. No other findings to suggest acute intracranial infarct or other process.  MRA HEAD IMPRESSION:  1. Moderate atherosclerotic irregularity within the proximal petrous segment of the right internal carotid artery without associated hemodynamically significant stenosis. 2. Otherwise unremarkable MRA of the brain.   Elberta Leatherwood, MD 10/22/2014, 9:34 AM PGY-1, [place] Intern pager: [phone], text pages welcome

## 2014-10-22 NOTE — Progress Notes (Signed)
Discharge orders received.  Discharge instructions and follow-up appointments reviewed with the patient.  VSS upon discharge.  IV removed and education complete.  Transported out via wheelchair family present. Cori Razor, RN

## 2014-10-22 NOTE — Plan of Care (Signed)
Problem: Phase I Progression Outcomes Goal: OOB as tolerated unless otherwise ordered Outcome: Completed/Met Date Met:  10/22/14

## 2014-10-22 NOTE — Plan of Care (Signed)
Problem: Phase I Progression Outcomes Goal: Pain controlled with appropriate interventions Outcome: Completed/Met Date Met:  10/22/14     

## 2014-10-22 NOTE — Plan of Care (Signed)
Problem: Phase II Progression Outcomes Goal: IV changed to normal saline lock Outcome: Completed/Met Date Met:  10/22/14

## 2014-10-22 NOTE — Progress Notes (Signed)
Subjective: patient continues to improve.   Objective: Current vital signs: BP 130/69 mmHg  Pulse 63  Temp(Src) 98.6 F (37 C) (Oral)  Resp 18  Ht 5\' 3"  (1.6 m)  Wt 129.819 kg (286 lb 3.2 oz)  BMI 50.71 kg/m2  SpO2 98%  LMP 09/29/2014 Vital signs in last 24 hours: Temp:  [98.3 F (36.8 C)-98.8 F (37.1 C)] 98.6 F (37 C) (12/09 3419) Pulse Rate:  [62-104] 63 (12/09 0611) Resp:  [16-18] 18 (12/09 0611) BP: (106-142)/(69-88) 130/69 mmHg (12/09 0611) SpO2:  [96 %-99 %] 98 % (12/09 0611)  Intake/Output from previous day:   Intake/Output this shift:   Nutritional status: Diet Heart  Neurologic Exam: General: NAD Mental Status: Alert, oriented, thought content appropriate. Speech fluent without evidence of aphasia. Able to follow 3 step commands without difficulty. Cranial Nerves: II: Discs flat bilaterally; Visual fields grossly normal, pupils equal, round, reactive to light and accommodation III,IV, VI: ptosis not present, extra-ocular motions intact bilaterally V,VII: smile symmetric, facial light touch sensation normal bilaterally VIII: hearing normal bilaterally IX,X: gag reflex present XI: bilateral shoulder shrug XII: midline tongue extension without atrophy or fasciculations  Motor: Right :Upper extremity 5/5Left: Upper extremity 5-/5 Lower extremity 5/5Lower extremity 5-/5 proximal Tone and bulk:normal tone throughout; no atrophy noted Sensory: Pinprick and light touch intact throughout, bilaterally Deep Tendon Reflexes:  1+ throughout  Plantars: Right: downgoingLeft: downgoing   Lab Results: Basic Metabolic Panel:  Recent Labs Lab 10/16/14 1557 10/16/14 1619 10/20/14 0625  NA 139 140 141  K 4.1 3.9 4.0  CL 102 102 101  CO2 26  --  25  GLUCOSE 100* 102* 146*  BUN 8 7 23   CREATININE 0.77 0.90 0.81   CALCIUM 10.7*  --  10.6*    Liver Function Tests:  Recent Labs Lab 10/16/14 1557  AST 17  ALT 24  ALKPHOS 87  BILITOT 0.2*  PROT 7.7  ALBUMIN 3.9   No results for input(s): LIPASE, AMYLASE in the last 168 hours. No results for input(s): AMMONIA in the last 168 hours.  CBC:  Recent Labs Lab 10/16/14 1417 10/16/14 1557 10/16/14 1619 10/20/14 0625 10/21/14 1410 10/22/14 0406  WBC 10.9* 11.0*  --  25.1* 18.6* 17.8*  NEUTROABS  --  7.3  --   --   --   --   HGB 12.4 12.7 14.3 12.1 12.2 11.8*  HCT 40.2 39.3 42.0 37.7 37.3 36.3  MCV 76.1* 73.6*  --  74.5* 74.3* 73.3*  PLT  --  280  --  261 250 229    Cardiac Enzymes: No results for input(s): CKTOTAL, CKMB, CKMBINDEX, TROPONINI in the last 168 hours.  Lipid Panel:  Recent Labs Lab 10/17/14 0605  CHOL 170  TRIG 185*  HDL 36*  CHOLHDL 4.7  VLDL 37  LDLCALC 97    CBG:  Recent Labs Lab 10/20/14 2048 10/21/14 0635 10/21/14 1641 10/21/14 2111 10/22/14 0701  GLUCAP 199* 148* 149* 187* 143*    Microbiology: Results for orders placed or performed during the hospital encounter of 10/16/14  Gram stain     Status: None   Collection Time: 10/21/14  3:53 PM  Result Value Ref Range Status   Specimen Description CSF  Final   Special Requests NONE  Final   Gram Stain   Final    WBC PRESENT,BOTH PMN AND MONONUCLEAR NO ORGANISMS SEEN CYTOSPIN SLIDE    Report Status 10/21/2014 FINAL  Final  CSF culture     Status: None (  Preliminary result)   Collection Time: 10/21/14  3:53 PM  Result Value Ref Range Status   Specimen Description CSF  Final   Special Requests NONE  Final   Gram Stain   Final    WBC PRESENT,BOTH PMN AND MONONUCLEAR NO ORGANISMS SEEN CYTOSPIN Performed at [facility] Performed at [place] Performed at [facility]   Final   Report Status PENDING  Incomplete    Coagulation Studies: No results for input(s): LABPROT, INR in the last 72  hours.  Imaging: Dg Fluoro Guide Lumbar Puncture  10/21/2014   CLINICAL DATA:  Multiple sclerosis  EXAM: LUMBAR PUNCTURE UNDER FLUOROSCOPY  FLUOROSCOPY TIME:  12 seconds  TECHNIQUE: The procedure, risks (including but not limited to bleeding, infection, organ damage ), benefits, and alternatives were explained to the patient. Questions regarding the procedure were encouraged and answered. The patient understands and consents to the procedure. An appropriate skin entry site was determined fluoroscopically. Operator donned sterile gloves and mask. Skin site was marked, then prepped with Betadine, draped in usual sterile fashion, and infiltrated locally with 1% lidocaine. A 20 gauge 6" spinal needle advanced into the thecal sac at L3 from a left parasagittal interlaminar approach. CSF spontaneously returned, with opening pressure of 19 cm water. There is some blood admixed with the CSF which persisted despite needle repositioning. 13.23ml CSF were collected and divided among 4 sterile vials for the requested laboratory studies. The needle was then removed.  COMPLICATIONS: None immediate  IMPRESSION: 1. Technically successful lumbar puncture under fluoroscopy. Traumatic tap with some admixed blood in the specimens.   Electronically Signed   By: Arne Cleveland M.D.   On: 10/21/2014 16:36    Medications:  Scheduled: . aspirin  300 mg Rectal Daily   Or  . aspirin  325 mg Oral Daily  . atorvastatin  40 mg Oral q1800  . hydrochlorothiazide  12.5 mg Oral Daily  . insulin aspart  0-15 Units Subcutaneous TID WC  . insulin aspart  0-5 Units Subcutaneous QHS  . irbesartan  300 mg Oral Daily  . pantoprazole  40 mg Oral Daily    Assessment/Plan: 43y/o woman admitted with left sided weakness and gait instability. Initial concern for lacunar infarct due to multiple risk factors. 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 There appears to be lesions with active DWI  restriction and some older lesions indicating separation in time and space. She has finished 5 days of solumedrol with improvement in strength. LP obtained showing elevated WBC likely secondary to high dose steroids and traumatic tap. Further pending LP results to be followed by out patient Neurology.   Recommend: 1) Follow up with out patient neurology   2) PT as recommended by physical therapy in hospital  No further recommendations.     Etta Quill PA-C [facility] [phone]  10/22/2014, 9:52 AM

## 2014-10-24 ENCOUNTER — Telehealth: Payer: Self-pay | Admitting: Radiology

## 2014-10-24 ENCOUNTER — Encounter: Payer: Self-pay | Admitting: Physician Assistant

## 2014-10-24 ENCOUNTER — Telehealth: Payer: Self-pay | Admitting: Physician Assistant

## 2014-10-24 LAB — CSF IGG: IgG, CSF: 7.1 mg/dL (ref 0.8–7.7)

## 2014-10-24 NOTE — Telephone Encounter (Signed)
Call praying for better days.

## 2014-10-24 NOTE — Telephone Encounter (Signed)
Spoke to patient. Placed paperwork in Dr. Perfecto Kingdom box instead.

## 2014-10-24 NOTE — Telephone Encounter (Signed)
FMLA paperwork received via fax from [employer] on 10/24/2014. Placed in [name] box the same day. A blank copy has been scanned in patient's chart. Awaiting completion in 5-7 business days.

## 2014-10-24 NOTE — Telephone Encounter (Signed)
Patient wants to make sure we have correct phone numbers. Unfortunately we did not, I have advised her of the appointment with Neurology on Wednesday, and she is going to call the Neurology office and confirm with them. I have updated her numbers. Patient indicates she had a very bad day yesterday and spent the day in bed

## 2014-10-25 LAB — CSF CULTURE: CULTURE: NO GROWTH

## 2014-10-25 NOTE — Telephone Encounter (Signed)
Call [name]. When is the RTW date?

## 2014-10-25 NOTE — Telephone Encounter (Signed)
Pt is going to follow up with Dr. Everlene Farrier on Monday. He spoke with pt.

## 2014-10-26 LAB — OLIGOCLONAL BANDS, CSF + SERM

## 2014-10-27 ENCOUNTER — Encounter: Payer: Self-pay | Admitting: Emergency Medicine

## 2014-10-27 ENCOUNTER — Ambulatory Visit (INDEPENDENT_AMBULATORY_CARE_PROVIDER_SITE_OTHER): Payer: 59 | Admitting: Emergency Medicine

## 2014-10-27 VITALS — BP 142/80 | HR 110 | Resp 18 | Ht 63.0 in | Wt 295.0 lb

## 2014-10-27 DIAGNOSIS — F17200 Nicotine dependence, unspecified, uncomplicated: Secondary | ICD-10-CM

## 2014-10-27 DIAGNOSIS — G35D Multiple sclerosis, unspecified: Secondary | ICD-10-CM

## 2014-10-27 DIAGNOSIS — R269 Unspecified abnormalities of gait and mobility: Secondary | ICD-10-CM

## 2014-10-27 DIAGNOSIS — G35 Multiple sclerosis: Secondary | ICD-10-CM

## 2014-10-27 DIAGNOSIS — I10 Essential (primary) hypertension: Secondary | ICD-10-CM

## 2014-10-27 DIAGNOSIS — Z720 Tobacco use: Secondary | ICD-10-CM

## 2014-10-27 DIAGNOSIS — R42 Dizziness and giddiness: Secondary | ICD-10-CM

## 2014-10-27 DIAGNOSIS — R739 Hyperglycemia, unspecified: Secondary | ICD-10-CM

## 2014-10-27 DIAGNOSIS — R531 Weakness: Secondary | ICD-10-CM

## 2014-10-27 DIAGNOSIS — R2689 Other abnormalities of gait and mobility: Secondary | ICD-10-CM

## 2014-10-27 LAB — GLUCOSE, POCT (MANUAL RESULT ENTRY): POC GLUCOSE: 148 mg/dL — AB (ref 70–99)

## 2014-10-27 NOTE — Progress Notes (Signed)
Patient with pulse ox 98% and recheck of HR 96. Upon auscultation, heart sounds regular and normal S1S2.

## 2014-10-27 NOTE — Progress Notes (Addendum)
   Subjective:    Patient ID: [name], female    DOB: January 27, 1971, 43 y.o.   MRN: [id number] This chart was scribed for Arlyss Queen, MD by Marti Sleigh, Medical Scribe. This patient was seen in Room 21 and the patient's care was started at 1:25 PM.  Chief Complaint  Patient presents with  . Hospitalization Follow-up    states good day today no headache    HPI HPI Comments: [name] is a 43 y.o. female with a hx of HTN, CVA, and multiple sclerosis who presents to [facility] reporting for a follow up after a hospital visit. Pt was recently diagnosed with MS. Pt states that she will see her neurologist on Wednesday and will schedule a follow up appointment on Thursday.     Review of Systems  Musculoskeletal: Positive for gait problem.  Neurological: Positive for weakness, numbness and headaches.       Objective:   Physical Exam  Constitutional: She is oriented to person, place, and time. She appears well-developed and well-nourished.  HENT:  Head: Normocephalic and atraumatic.  Eyes: Pupils are equal, round, and reactive to light.  Neck: Neck supple.  Cardiovascular: Normal rate and regular rhythm.   Pulmonary/Chest: Effort normal and breath sounds normal. No respiratory distress.  Musculoskeletal:  Cranial nerves are normal. Reflexes are normal. Motor strength, weakness with dorsiflection bilaterally. Decreased fine touch and vibratory sensation let leg. Walks with wide-based gait and is some what unsteady. Unable to do tandem walk.   Neurological: She is alert and oriented to person, place, and time.  Skin: Skin is warm and dry.  Psychiatric: She has a normal mood and affect. Her behavior is normal.  Nursing note and vitals reviewed.      Assessment & Plan:  We need to balance her recent diagnosis of MS with her desire to get back to work. She wants to return to work now. I told her she needs to see the neurologist first on Wednesday and then I will see her back on  Thursday and make a decision about her work status. I told her to hold off on the Lipitor for now. I did tell her to contact the EAP program to get help. I also encouraged her to call and get scheduled for her physical therapy.I personally performed the services described in this documentation, which was scribed in my presence. The recorded information has been reviewed and is accurate. She also was hyperglycemic in the hospital with a borderline high A1c. We'll check a sugar today.

## 2014-10-29 ENCOUNTER — Encounter: Payer: Self-pay | Admitting: Diagnostic Neuroimaging

## 2014-10-29 ENCOUNTER — Ambulatory Visit: Payer: 59 | Attending: Emergency Medicine

## 2014-10-29 ENCOUNTER — Ambulatory Visit (INDEPENDENT_AMBULATORY_CARE_PROVIDER_SITE_OTHER): Payer: 59 | Admitting: Diagnostic Neuroimaging

## 2014-10-29 VITALS — BP 128/93 | HR 87 | Ht 63.0 in | Wt 296.2 lb

## 2014-10-29 DIAGNOSIS — G35 Multiple sclerosis: Secondary | ICD-10-CM

## 2014-10-29 DIAGNOSIS — R269 Unspecified abnormalities of gait and mobility: Secondary | ICD-10-CM | POA: Diagnosis present

## 2014-10-29 NOTE — Patient Instructions (Signed)
I will check additional labs.  We will consider tecfidera or tysabri.

## 2014-10-29 NOTE — Progress Notes (Signed)
[facility]  PATIENT: [name] DOB: November 28, 1970  REFERRING CLINICIAN: McKeag HISTORY FROM: patient (with 2 daughters) REASON FOR VISIT: new consult    HISTORICAL  CHIEF COMPLAINT:  Chief Complaint  Patient presents with  . Multiple Sclerosis    HISTORY OF PRESENT ILLNESS:   43 year old right-handed female here for evaluation of multiple sclerosis. 10/15/14 patient developed staggering to the left side. The next day she noted left arm and left leg heaviness and weakness. Patient was in the hospital and had evaluation. MRI of the brain demonstrated multiple brain lesions suspicious for demyelinating disease. Patient was treated with IV steroids and her left sided symptoms have almost completely resolved. Patient continues to have a slight limp with left leg difficulty. Also has some slight incoordination with left hand.  In the past patient has had intermittent headaches. Patient has also had intermittent episodes of knee pain and intermittent vertigo attacks. Patient has family history of multiple sclerosis in maternal uncle and maternal cousin.    REVIEW OF SYSTEMS: Full 14 system review of systems performed and notable only for shortness of breath snoring sleep apnea on CPAP.  ALLERGIES: Allergies  Allergen Reactions  . Iron Shortness Of Breath    IV only   . Delsym [Dextromethorphan Polistirex Er] Other (See Comments)    nightmares  . Hydrocodone-Guaifenesin Other (See Comments)    nightmares  . Doxycycline Other (See Comments)    Bad taste to patient    HOME MEDICATIONS: Outpatient Prescriptions Prior to Visit  Medication Sig Dispense Refill  . aspirin EC 81 MG tablet Take 1 tablet (81 mg total) by mouth daily. 30 tablet 0  . telmisartan-hydrochlorothiazide (MICARDIS HCT) 80-12.5 MG per tablet One daily 90 tablet 3  . atorvastatin (LIPITOR) 40 MG tablet Take 1 tablet (40 mg total) by mouth daily at 6 PM. (Patient not taking: Reported on  10/27/2014) 30 tablet 0  . calcium carbonate (TUMS - DOSED IN MG ELEMENTAL CALCIUM) 500 MG chewable tablet Chew 1 tablet by mouth as needed for indigestion or heartburn.    . ergocalciferol (DRISDOL) 50000 UNITS capsule Take 1 capsule (50,000 Units total) by mouth once a week. (Patient not taking: Reported on 10/27/2014) 4 capsule 5  . furosemide (LASIX) 20 MG tablet Take 20 mg by mouth as needed for fluid.    Marland Kitchen ibuprofen (ADVIL,MOTRIN) 800 MG tablet Take 800 mg by mouth every 8 (eight) hours as needed.    . rizatriptan (MAXALT) 10 MG tablet Take 1 tablet (10 mg total) by mouth as needed for migraine. May repeat in 2 hours if needed (Patient not taking: Reported on 10/27/2014) 10 tablet 0  . ALPRAZolam (XANAX) 0.5 MG tablet Take 1 tablet (0.5 mg total) by mouth 2 (two) times daily as needed for anxiety. Take up to two tablets daily for anxiety 60 tablet 0   No facility-administered medications prior to visit.    PAST MEDICAL HISTORY: Past Medical History  Diagnosis Date  . Hypertension   . Smoker   . Allergy   . Anemia   . Depression   . Anxiety   . Sickle cell trait   . Iron overload, transfusional   . Sickle cell anemia     trait  . Clotting disorder     from beeing anemic  . Migraine     PAST SURGICAL HISTORY: Past Surgical History  Procedure Laterality Date  . Cervical ablation    . Tubal ligation    . Cholecystectomy    .  Cesarean section      3 times    FAMILY HISTORY: Family History  Problem Relation Age of Onset  . Mental illness Mother   . Hyperlipidemia Mother   . Hyperthyroidism Mother   . Mental retardation Mother   . ADD / ADHD Son   . Diabetes Father   . Cancer Father   . Cancer Paternal Aunt     breast  . Arthritis Maternal Grandmother   . Diabetes Maternal Grandmother   . Hearing loss Maternal Grandmother     left hear when she was a child  . Hyperlipidemia Maternal Grandmother   . Hypertension Maternal Grandmother   . Alcohol abuse Maternal  Grandfather   . Early death Maternal Grandfather   . Cancer Paternal Aunt     breast  . Multiple sclerosis Cousin     SOCIAL HISTORY:  History   Social History  . Marital Status: Single    Spouse Name: N/A    Number of Children: 3  . Years of Education: 12+   Occupational History  . Not on file.   Social History Main Topics  . Smoking status: Former Smoker -- 0.25 packs/day for 20 years    Types: Cigarettes    Quit date: 10/16/2014  . Smokeless tobacco: Never Used     Comment: smoke 5 cigarettes/day or prn  . Alcohol Use: 0.0 oz/week    0 Not specified per week     Comment: once maybe a month if that  . Drug Use: No  . Sexual Activity:    Partners: Male    Birth Control/ Protection: Condom   Other Topics Concern  . Not on file   Social History Narrative   Patient lives at home with her family.   Caffeine Use: 1 cup daily     PHYSICAL EXAM  Filed Vitals:   10/29/14 1107  BP: 128/93  Pulse: 87  Height: 5\' 3"  (1.6 m)  Weight: 296 lb 3.2 oz (134.355 kg)    Body mass index is 52.48 kg/(m^2).   Visual Acuity Screening   Right eye Left eye Both eyes  Without correction: 20/30 20/30   With correction:       No flowsheet data found.  GENERAL EXAM: Patient is in no distress; well developed, nourished and groomed; neck is supple  CARDIOVASCULAR: Regular rate and rhythm, no murmurs, no carotid bruits  NEUROLOGIC: MENTAL STATUS: awake, alert, oriented to person, place and time, recent and remote memory intact, normal attention and concentration, language fluent, comprehension intact, naming intact, fund of knowledge appropriate CRANIAL NERVE: no papilledema on fundoscopic exam, pupils equal and reactive to light, visual fields full to confrontation, extraocular muscles intact, no nystagmus, facial sensation and strength symmetric, hearing intact, palate elevates symmetrically, uvula midline, shoulder shrug symmetric, tongue midline. MOTOR: normal bulk and  tone, full strength in the RUE, RLE; SLIGHTLY DECR COORDINATION IN LUE AND LLE SENSORY: normal and symmetric to light touch, pinprick, temperature, vibration and proprioception COORDINATION: finger-nose-finger, fine finger movements SLOW ON LEFT SIDE; NORMAL ON RIGHT.  REFLEXES: deep tendon reflexes present and symmetric GAIT/STATION: narrow based gait; SLOW CAREFUL GAIT; SLIGHTL LIMP WITH LEFT LEG    DIAGNOSTIC DATA (LABS, IMAGING, TESTING) - I reviewed patient records, labs, notes, testing and imaging myself where available.  Lab Results  Component Value Date   WBC 17.8* 10/22/2014   HGB 11.8* 10/22/2014   HCT 36.3 10/22/2014   MCV 73.3* 10/22/2014   PLT 229 10/22/2014  Component Value Date/Time   NA 141 10/20/2014 0625   K 4.0 10/20/2014 0625   CL 101 10/20/2014 0625   CO2 25 10/20/2014 0625   GLUCOSE 146* 10/20/2014 0625   BUN 23 10/20/2014 0625   CREATININE 0.81 10/20/2014 0625   CREATININE 0.61 08/21/2014 1440   CALCIUM 10.6* 10/20/2014 0625   PROT 7.7 10/16/2014 1557   ALBUMIN 3.9 10/16/2014 1557   AST 17 10/16/2014 1557   ALT 24 10/16/2014 1557   ALKPHOS 87 10/16/2014 1557   BILITOT 0.2* 10/16/2014 1557   GFRNONAA 86* 10/20/2014 0625   GFRAA >90 10/20/2014 0625   Lab Results  Component Value Date   CHOL 170 10/17/2014   HDL 36* 10/17/2014   LDLCALC 97 10/17/2014   TRIG 185* 10/17/2014   CHOLHDL 4.7 10/17/2014   Lab Results  Component Value Date   HGBA1C 6.1* 10/17/2014   No results found for: [id number] Lab Results  Component Value Date   TSH 0.904 12/12/2013    I reviewed images myself and agree with interpretation. -VRP  10/17/14 MRI brain (without)  1. Multiple T2/FLAIR hyperintense lesions with associated restricted diffusion involving the white matter of both cerebral hemispheres, right greater than left, most consistent with active demyelinating disease. 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 Follow-up examination with postcontrast imaging could be performed for complete evaluation. 2. No other findings to suggest acute intracranial infarct or other process.  10/17/14 MRI cervical spine (with and without)  - Motion degraded exam demonstrating no convincing evidence for spinal MS.  Certainly there are no areas of T2 hyperintensity or cord enlargement. No disc protrusion or spinal stenosis.  10/18/14 MRI brain (with) - Findings consistent with acute demyelination in the RIGHT posterior frontal subcortical white matter in this patient with suspected multiple sclerosis. Multiple other areas of T2 signal hyperintensity on the previous study do not display similar postcontrast enhancement. 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   10/21/14 CSF opening pressure 19cm H2O - (WBC 18, RBC 3545, protein 50, glucose 110, cryptococcal ag negative, IgG index 7.1 (normal), OCB > 5 well defined gamma restriction bands)     ASSESSMENT AND PLAN  43 y.o. 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 Diagnosis, prognosis and treatment options reviewed with patient and family. Will check additional blood testing to rule out mimicking conditions. Will check JC virus antibody status to see if she qualifies for Tysabri.  PLAN: - labs; then will start tecfidera vs tysabri  Orders Placed This Encounter  Procedures  . Stratify JCV Antibody Test ([facility])  . Angiotensin converting enzyme  . Pan-ANCA  . ANA w/Reflex   Return in about 1 month (around 11/29/2014).   I reviewed images,  labs, notes, records myself. I summarized findings and reviewed with patient, for this high risk condition (new dx of multiple sclerosis, possible tumefactive multiple sclerosis or balo's concentric sclerosis) requiring high complexity decision making.    Penni Bombard, MD 09/81/1914, 78:29 PM Certified in Neurology, Neurophysiology and Neuroimaging  [facility] [address], [place], [place] [address] [phone]

## 2014-10-29 NOTE — Therapy (Signed)
[facility] [address] [place], [place], [address] Phone: [phone]   Fax:  [phone]  Physical Therapy Evaluation  Patient Details  Name: [name] MRN: [id number] Date of Birth: Jul 16, 1971  Encounter Date: 10/29/2014      PT End of Session - 10/29/14 1624    Visit Number 1   Number of Visits 9   Authorization Type UMR   PT Start Time 1532   PT Stop Time 1615   PT Time Calculation (min) 43 min   Equipment Utilized During Treatment Gait belt   Activity Tolerance Patient tolerated treatment well   Behavior During Therapy [facility] for tasks assessed/performed      Past Medical History  Diagnosis Date  . Hypertension   . Smoker   . Allergy   . Anemia   . Depression   . Anxiety   . Sickle cell trait   . Iron overload, transfusional   . Sickle cell anemia     trait  . Clotting disorder     from beeing anemic  . Migraine     Past Surgical History  Procedure Laterality Date  . Cervical ablation    . Tubal ligation    . Cholecystectomy    . Cesarean section      3 times    LMP 09/29/2014  Visit Diagnosis:  Abnormality of gait - Plan: PT plan of care cert/re-cert      Subjective Assessment - 10/29/14 1551    Symptoms N/T in L UE/LE, impaired balance, difficulty walking   Patient Stated Goals walking better, get back to work,    Currently in Pain? No/denies          [facility] PT Assessment - 10/29/14 1539    Precautions   Precautions Fall   Restrictions   Weight Bearing Restrictions No   Balance Screen   Has the patient fallen in the past 6 months No   Has the patient had a decrease in activity level because of a fear of falling?  Yes   Is the patient reluctant to leave their home because of a fear of falling?  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  Pt is R handed.   Leisure Watch TV, going to the movies    Cognition   Overall Cognitive Status Within Functional Limits for tasks assessed   Coordination   Gross Motor Movements are Fluid and Coordinated Yes   Fine Motor Movements are Fluid and Coordinated Yes   Posture/Postural Control   Posture/Postural Control Postural limitations   Postural Limitations Rounded Shoulders;Forward head;Posterior pelvic tilt   AROM   Overall AROM  Within functional limits for tasks performed   Strength   Overall Strength Deficits   Overall Strength Comments B UE strength WFL. R LE 4+/5 globally. L knee ext: 4+/5, L knee flex: 4/5,  L hip flexion: 3+/5, L dorsiflexion: 4/5, but pt experienced decr. L DF after ambulating >100'.   Transfers   Transfers Sit to Stand;Stand to Sit   Sit to Stand 5: Supervision;With upper extremity assist;From chair/3-in-1   Stand to Sit 5: Supervision;With upper extremity assist;To chair/3-in-1   Stand to Sit Details VC's to improve eccentric control.   Ambulation/Gait   Ambulation/Gait Yes   Ambulation/Gait Assistance 5: Supervision   Ambulation/Gait Assistance Details Pt ambulated over even terrain with no LOB episodes.   Ambulation Distance (Feet) 100 Feet   Assistive device None   Gait Pattern Step-through pattern;Decreased dorsiflexion - left;Trendelenburg   Gait velocity 2.59ft/sec.   Balance   Balance Assessed Yes   Static Standing Balance   Static Standing - Balance Support  No upper extremity supported   Static Standing - Level of Assistance 4: Min assist;Other (comment)  min guard   Static Standing Balance -  Activities  Single Leg Stance - Right Leg;Single Leg Stance - Left Leg;Tandam Stance - Right Leg  feet together/apart with eyes open/closed   Static Standing - Comment/# of Minutes Pt required min A to maintain balance during tandem stance, pt able to stand on R LE for 9 seconds but unable to stand on L LE for >1sec. Pt noted to experience incr. postural sway with eyes closed during feet apart.   Standardized Balance Assessment   Standardized Balance Assessment Timed Up and Go Test   Timed Up and Go Test   TUG Normal TUG   Normal TUG (seconds) 8.66            PT Education - 10/29/14 1623    Education provided Yes   Education Details PT educated pt on the importance of taking rest breaks as needed for energy conservation, and to avoid exercising in heat.   Person(s) Educated Patient;Child(ren)  pt's daughter, Julianne Rice   Methods Explanation   Comprehension Verbalized understanding            PT Long Term Goals - 10/29/14 1627    PT LONG TERM GOAL #1   Title Pt will be independent in HEP to improve functional mobility and strength. Target date: 11/26/14.   Status New   PT LONG TERM GOAL #2   Title Perform BERG and write goal if appropriate. Target date: 11/26/14.   Status New   PT LONG TERM GOAL #3   Title Pt will be able to ambulate >400' over even/uneven terrain, independently without LOB, in order to improve functional mobility. Target date: 11/26/14.   Status New   PT LONG TERM GOAL #4   Title Pt will verbalize plans to join gym at PT d/c, to exercise with her daughter and maintain strength gains made in PT. Target date: 11/26/14.   Status New   PT LONG TERM GOAL #5   Title Pt will improve ABC score by 10% to improve quality of life. Target date: 11/26/14.   Status New   Additional Long Term Goals   Additional Long Term Goals Yes   PT  LONG TERM GOAL #6   Title Pt will verbalize she returned to work (full-time) at PT d/c. Target date: 11/26/14.   Status New   PT LONG TERM GOAL #7   Title Perform DGI and write goal if appropriate. Target date: 11/26/14.   Status New          Plan - 10/29/14 1547    Clinical Impression Statement Pt is a pleasant 43 y/o female presenting  to [facility] neuro with recent hospitalization due to new MS diagnosis/exacerbation. Pt reported symptoms began 2 weeks ago, when she got out of bed and she felt like she could not walk well, and "It felt like I was drunk". Pt reported she did not have control  L UE/LE, and was really off balance. Pt went to the hospital during work as her co-workers noticed that she was not walking well, she stayed in the hospital from 10/17/14-10/22/14. Pt denied falling during that time, but she is nervous about falling due to impaired balance.   Pt will benefit from skilled therapeutic intervention in order to improve on the following deficits Abnormal gait;Impaired flexibility;Impaired sensation;Obesity;Decreased activity tolerance;Decreased balance;Decreased knowledge of use of DME;Decreased strength;Decreased mobility   Rehab Potential Good   PT Frequency 2x / week   PT Duration 4 weeks   PT Treatment/Interventions ADLs/Self Care Home Management;Gait training;Neuromuscular re-education;Stair training;Biofeedback;Functional mobility training;Patient/family education;Therapeutic activities;Cryotherapy;Electrical Stimulation;Therapeutic exercise;Manual techniques;DME Instruction;Balance training   PT Next Visit Plan Perform BERG, DGI, provide pt with information on MS, initiate balance/strengthening HEP.   Consulted and Agree with Plan of Care Patient;Family member/caregiver   Family Member Consulted pt's daughter, Julianne Rice.                              Problem List Patient Active Problem List   Diagnosis Date Noted  . Tachycardia   . Prediabetes   . MS  (multiple sclerosis)   . Multiple sclerosis   . Weakness   . Obesity, Class III, BMI 40-49.9 (morbid obesity) 10/16/2014  . Smoker 10/16/2014  . OSA on CPAP 10/16/2014  . Acute CVA (cerebrovascular accident) 10/16/2014  . Stroke 10/16/2014  . Cough 03/19/2013  . HTN (hypertension) 01/14/2012  . Anemia 01/14/2012  . Sickle cell trait 01/14/2012    Walda Hertzog L 10/29/2014, 4:34 PM      Geoffry Paradise, PT,DPT 10/29/2014 4:34 PM Phone: [phone] Fax: [phone]

## 2014-10-30 ENCOUNTER — Encounter: Payer: Self-pay | Admitting: Diagnostic Neuroimaging

## 2014-10-30 ENCOUNTER — Ambulatory Visit (INDEPENDENT_AMBULATORY_CARE_PROVIDER_SITE_OTHER): Payer: 59 | Admitting: Emergency Medicine

## 2014-10-30 ENCOUNTER — Telehealth: Payer: Self-pay | Admitting: Diagnostic Neuroimaging

## 2014-10-30 ENCOUNTER — Encounter: Payer: Self-pay | Admitting: Emergency Medicine

## 2014-10-30 VITALS — BP 160/110 | HR 92 | Temp 98.7°F | Resp 16 | Ht 64.0 in | Wt 293.4 lb

## 2014-10-30 DIAGNOSIS — R531 Weakness: Secondary | ICD-10-CM

## 2014-10-30 DIAGNOSIS — G35 Multiple sclerosis: Secondary | ICD-10-CM

## 2014-10-30 DIAGNOSIS — I10 Essential (primary) hypertension: Secondary | ICD-10-CM

## 2014-10-30 DIAGNOSIS — Z720 Tobacco use: Secondary | ICD-10-CM

## 2014-10-30 DIAGNOSIS — R2689 Other abnormalities of gait and mobility: Secondary | ICD-10-CM

## 2014-10-30 DIAGNOSIS — F17200 Nicotine dependence, unspecified, uncomplicated: Secondary | ICD-10-CM

## 2014-10-30 DIAGNOSIS — R42 Dizziness and giddiness: Secondary | ICD-10-CM

## 2014-10-30 DIAGNOSIS — R269 Unspecified abnormalities of gait and mobility: Secondary | ICD-10-CM

## 2014-10-30 NOTE — Patient Instructions (Signed)
Patient may return to work 11/03/2014. Four hour days. Limited walking. Office type work. Patient is cleared to draw blood.

## 2014-10-30 NOTE — Progress Notes (Addendum)
   Subjective:    Patient ID: [name], female    DOB: Mar 24, 1971, 43 y.o.   MRN: [id number] This chart was scribed for Arlyss Queen, MD by Marti Sleigh, Medical Scribe. This patient was seen in Room 21 and the patient's care was started at 12:26 PM.  Chief Complaint  Patient presents with  . Follow-up  . Hypertension  . MS    HPI HPI Comments: [name] is a 43 y.o. female with a hx of HTN, CVA and prediabetes who presents to [facility] reporting for a follow up appointment for her MS. Pt states that she reported to her neurologist and was told that she would be able to back to work. Pt is in physical therapy twice per week.     Review of Systems  Musculoskeletal: Positive for gait problem (intermittent).  Neurological: Positive for weakness.       Objective:   Physical Exam  Constitutional: She is oriented to person, place, and time. She appears well-developed and well-nourished.  HENT:  Head: Normocephalic and atraumatic.  Eyes: Pupils are equal, round, and reactive to light.  Neck: Neck supple.  Cardiovascular: Normal rate and regular rhythm.   Pulmonary/Chest: Effort normal and breath sounds normal. No respiratory distress.  Neurological: She is alert and oriented to person, place, and time.  Cranial nerves are intact. Her fine motor of the upper extremities is normal. She does have some weakness of dorsiflexion of the left leg and does walk with a mild wide-based gait with some instability. She has difficulty with squatting.  Skin: Skin is warm and dry.  Psychiatric: She has a normal mood and affect. Her behavior is normal.  Nursing note and vitals reviewed.      Assessment & Plan:  Patient will return to work 4  hour days starting on Monday. I did discuss this with her neurologist Dr. Jae Dire.I personally performed the services described in this documentation, which was scribed in my presence. The recorded information has been reviewed and is accurate.Marland Kitchen Her blood  pressure was elevated today but she had not taken her medication yet.

## 2014-10-30 NOTE — Telephone Encounter (Signed)
Dr. Everlene Farrier would like for Dr. Leta Baptist to call him on his cell at his earliest convience.

## 2014-10-30 NOTE — Telephone Encounter (Signed)
I spoke with Dr. Everlene Farrier. Ok for patient to gradually phase back into work with reduced hours. -VRP

## 2014-10-31 ENCOUNTER — Telehealth: Payer: Self-pay | Admitting: Emergency Medicine

## 2014-10-31 LAB — PAN-ANCA
ANCA Proteinase 3: <3.5 U/mL (ref 0.0–3.5)
Atypical pANCA: <1:20 {titer}
C-ANCA: <1:20 {titer}
Myeloperoxidase Ab: <9 U/mL (ref 0.0–9.0)

## 2014-10-31 LAB — ANGIOTENSIN CONVERTING ENZYME: Angio Convert Enzyme: 26 U/L (ref 14–82)

## 2014-10-31 LAB — ANA W/REFLEX: Anti Nuclear Antibody(ANA): NEGATIVE

## 2014-10-31 NOTE — Telephone Encounter (Signed)
[employer] faxed an Attending Physician's Statement on 10/31/2014. Placed in Dr. Perfecto Kingdom box the same day. Please return to disabilities department upon completion.

## 2014-11-04 ENCOUNTER — Ambulatory Visit: Payer: 59

## 2014-11-05 ENCOUNTER — Telehealth: Payer: Self-pay | Admitting: Radiology

## 2014-11-05 NOTE — Telephone Encounter (Signed)
And she just needs to call their office and make sure she has the appointment time so she can get started on treatment.

## 2014-11-05 NOTE — Telephone Encounter (Signed)
She was seen at their office, she wants you to see if you can help. She has called and does not get any response. She wants you to see if the labs have been reviewed by the doctor (she did see Dr Dalene Carrow) Can you call her? She also states you have her FMLA forms, and she needs these filled out.

## 2014-11-05 NOTE — Telephone Encounter (Signed)
Patient has not heard anything from the Neurologist yet, regarding her diagnosis/ treatment, do you have any suggestions?

## 2014-11-06 ENCOUNTER — Ambulatory Visit: Payer: 59

## 2014-11-06 DIAGNOSIS — R269 Unspecified abnormalities of gait and mobility: Secondary | ICD-10-CM | POA: Diagnosis not present

## 2014-11-06 NOTE — Telephone Encounter (Signed)
Pt.notified

## 2014-11-06 NOTE — Telephone Encounter (Signed)
Call patient. I suspect with the holidays it will be difficult to get anything done until next week. I will put in a call to her neurologist. I need [name] to talk with [name] next-door to be sure we fill out her forms correctly.

## 2014-11-06 NOTE — Therapy (Signed)
[facility] [address] [place], [place], [address] Phone: [phone]   Fax:  [phone]  Physical Therapy Treatment  Patient Details  Name: [name] MRN: [id number] Date of Birth: 04-27-1971  Encounter Date: 11/06/2014      PT End of Session - 11/06/14 0852    Visit Number 2   Number of Visits 9   Authorization Type UMR   PT Start Time 0801   PT Stop Time 0845   PT Time Calculation (min) 44 min   Equipment Utilized During Treatment Gait belt   Activity Tolerance Patient tolerated treatment well   Behavior During Therapy [facility] for tasks assessed/performed      Past Medical History  Diagnosis Date  . Hypertension   . Smoker   . Allergy   . Anemia   . Depression   . Anxiety   . Sickle cell trait   . Iron overload, transfusional   . Sickle cell anemia     trait  . Clotting disorder     from beeing anemic  . Migraine     Past Surgical History  Procedure Laterality Date  . Cervical ablation    . Tubal ligation    . Cholecystectomy    . Cesarean section      3 times    LMP 10/27/2014  Visit Diagnosis:  Abnormality of gait      Subjective Assessment - 11/06/14 0803    Symptoms Pt denied falls or changes since last visit. Pt reported she started working 4 hours/day this week and she forgot about appt. on Tuesday.   Patient Stated Goals walking better, get back to work,    Currently in Pain? No/denies                    [facility] Adult PT Treatment/Exercise - 11/06/14 0804    Balance   Balance Assessed Yes   Static Standing Balance   Static Standing - Balance Support No upper extremity supported   Static Standing - Level of Assistance 5: Stand by assistance;Other (comment)  min guard   Static Standing - Comment/# of Minutes Balance activities performed in corner with chair in front of pt for safety; perform 1-2 reps with 10-30sec. hold with B LE on compliant and non-compliant  surfaces: feet apart/together with eyes open/closed, feet apart/together with head turns with eyes open/closed, tandem stance, marching in place, single leg stance. VC's for technique.   Dynamic Standing Balance   Dynamic Standing - Balance Support No upper extremity supported   Dynamic Standing - Level of Assistance 5: Stand by assistance   Dynamic Standing - Balance Activities Forward lean/weight shifting   Dynamic Standing - Comments x20 reps. VC's to not stick buttocks out during post. weight shifting.   Standardized Balance Assessment   Standardized Balance Assessment Berg Balance Test;Dynamic Gait Index   Berg Balance Test   Sit to Stand Able to stand without using hands and stabilize independently   Standing Unsupported Able to stand safely 2 minutes   Sitting with Back Unsupported but Feet Supported on Floor or Stool Able to sit safely and securely 2 minutes   Stand to Sit Sits safely with minimal use of hands   Transfers Able to transfer safely, minor use of hands   Standing Unsupported with Eyes Closed Able to stand 10 seconds safely   Standing Ubsupported with Feet Together Able to place feet together independently and stand 1 minute safely   From  Standing, Reach Forward with Outstretched Arm Can reach confidently >25 cm (10")  12"   From Standing Position, Pick up Object from Floor Able to pick up shoe, needs supervision   From Standing Position, Turn to Look Behind Over each Shoulder Looks behind one side only/other side shows less weight shift   Turn 360 Degrees Able to turn 360 degrees safely one side only in 4 seconds or less   Standing Unsupported, Alternately Place Feet on Step/Stool Able to stand independently and safely and complete 8 steps in 20 seconds   Standing Unsupported, One Foot in Front Able to plae foot ahead of the other independently and hold 30 seconds  was able to hold tandem stance for 23 sec.   Standing on One Leg Tries to lift leg/unable to hold 3 seconds  but remains standing independently  2 seconds before requiring min A to maintain balance   Total Score 49   Dynamic Gait Index   Level Surface Normal   Change in Gait Speed Mild Impairment   Gait with Horizontal Head Turns Normal   Gait with Vertical Head Turns Mild Impairment   Gait and Pivot Turn Mild Impairment   Step Over Obstacle Mild Impairment   Step Around Obstacles Normal   Steps Normal   Total Score 20                PT Education - 11/06/14 0851    Education provided Yes   Education Details Balance HEP. Pt reported MD has given her an official MS diagnosis as they are awaiting blood work and pt reported she would like information on MS once diagnosis is official.   Forensic psychologist) Educated Patient   Methods Explanation;Demonstration;Verbal cues;Handout   Comprehension Returned demonstration;Verbalized understanding             PT Long Term Goals - 11/06/14 0854    PT LONG TERM GOAL #1   Title Pt will be independent in HEP to improve functional mobility and strength. Target date: 11/26/14.   Status On-going   PT LONG TERM GOAL #2   Title Perform BERG and write goal if appropriate. Target date: 11/26/14.   Status Achieved   PT LONG TERM GOAL #3   Title Pt will be able to ambulate >400' over even/uneven terrain, independently without LOB, in order to improve functional mobility. Target date: 11/26/14.   Status On-going   PT LONG TERM GOAL #4   Title Pt will verbalize plans to join gym at PT d/c, to exercise with her daughter and maintain strength gains made in PT. Target date: 11/26/14.   Status On-going   PT LONG TERM GOAL #5   Title Pt will improve ABC score by 10% to improve quality of life. Target date: 11/26/14.   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 Target date: 11/26/14.   Status On-going   PT LONG TERM GOAL #7   Title Perform DGI and write goal if appropriate. Target date: 11/26/14.    Baseline Not appropriate as pt scored 20/24.   Status Achieved   PT LONG TERM GOAL #8   Title Pt will improve BERG score to >/=53/56 to decrease falls risk. Target date: 11/26/14.   Status New               Plan - 11/06/14 5093    Clinical Impression Statement Pt scored 49/56 on BERG indicating she is  at a moderate risk for falls. Pt scored 20/24 on DGI, as she had difficulty stepping over obstacles and experienced gait deviations while performing head turns. Continue with POC.   Pt will benefit from skilled therapeutic intervention in order to improve on the following deficits Abnormal gait;Impaired flexibility;Impaired sensation;Obesity;Decreased activity tolerance;Decreased balance;Decreased knowledge of use of DME;Decreased strength;Decreased mobility   Rehab Potential Good   PT Frequency 2x / week   PT Duration 4 weeks   PT Treatment/Interventions ADLs/Self Care Home Management;Gait training;Neuromuscular re-education;Stair training;Biofeedback;Functional mobility training;Patient/family education;Therapeutic activities;Cryotherapy;Electrical Stimulation;Therapeutic exercise;Manual techniques;DME Instruction;Balance training   PT Next Visit Plan Initiate strengthening HEP.   Consulted and Agree with Plan of Care Patient        Problem List Patient Active Problem List   Diagnosis Date Noted  . Tachycardia   . Prediabetes   . MS (multiple sclerosis)   . Multiple sclerosis   . Weakness   . Obesity, Class III, BMI 40-49.9 (morbid obesity) 10/16/2014  . Smoker 10/16/2014  . OSA on CPAP 10/16/2014  . Acute CVA (cerebrovascular accident) 10/16/2014  . Stroke 10/16/2014  . Cough 03/19/2013  . HTN (hypertension) 01/14/2012  . Anemia 01/14/2012  . Sickle cell trait 01/14/2012    Kayton Dunaj L 11/06/2014, 8:57 AM  [facility] [address] [place], [place], [address] Phone: [phone]   Fax:   [phone]     Geoffry Paradise, PT,DPT 11/06/2014 8:57 AM Phone: [phone] Fax: [phone]

## 2014-11-06 NOTE — Patient Instructions (Signed)
Perform all balance exercises in a corner with chair in front of you for safety:   Feet Heel-Toe "Tandem", Varied Arm Positions - Eyes Open   With eyes open, right foot directly in front of the other, arms at your side, look straight ahead at a stationary object. Hold _30___ seconds. Repeat with left foot in front. Repeat __3__ times per session. Do __1__ sessions per day.  Copyright  VHI. All rights reserved.  Single Leg - Eyes Open   Holding support, lift right leg while maintaining balance over other leg. Progress to removing hands from support surface for longer periods of time. Hold__10-30__ seconds. Repeat while lifting left leg. Repeat __3__ times per session. Do _1___ sessions per day.  Copyright  VHI. All rights reserved.  Feet Together (Compliant Surface) Head Motion - Eyes Closed   Stand on compliant surface: ___pillow_____ with feet together. Close eyes and move head slowly, up and down and side to side for 30 seconds. Repeat _3___ times per session. Do _1___ sessions per day.  Copyright  VHI. All rights reserved.  Weight Shift: Anterior / Posterior (Limits of Stability)   Slowly shift weight backward until toes begin to rise off floor. Return to starting position. Shift weight slowly forward until heels begin to rise off floor. Hold each position _3___ seconds. Repeat __20__ times per session. Do __1__ sessions per day.   Copyright  VHI. All rights reserved.  Marching in Place: Varied Surfaces   March in place, slowly lifting knees toward ceiling x10 per leg. Repeat __3__ times per session. Do __1__ sessions per day.  Copyright  VHI. All rights reserved.

## 2014-11-10 NOTE — Telephone Encounter (Signed)
I will speak to her tomorrow when I'm down at 104 about her sleeping issue.

## 2014-11-10 NOTE — Telephone Encounter (Signed)
Patient state she is not sleeping can you provide her with something to help her sleep? Also we are still looking for the [facility] papers

## 2014-11-10 NOTE — Telephone Encounter (Signed)
Dr Everlene Farrier, where are the papers? [name] does not have them and [name] does not have them, they were due yesterday.

## 2014-11-11 ENCOUNTER — Ambulatory Visit: Payer: 59

## 2014-11-11 ENCOUNTER — Telehealth: Payer: Self-pay | Admitting: Diagnostic Neuroimaging

## 2014-11-11 ENCOUNTER — Other Ambulatory Visit (INDEPENDENT_AMBULATORY_CARE_PROVIDER_SITE_OTHER): Payer: 59 | Admitting: *Deleted

## 2014-11-11 DIAGNOSIS — R269 Unspecified abnormalities of gait and mobility: Secondary | ICD-10-CM

## 2014-11-11 DIAGNOSIS — R7309 Other abnormal glucose: Secondary | ICD-10-CM

## 2014-11-11 LAB — GLUCOSE, POCT (MANUAL RESULT ENTRY): POC Glucose: 128 mg/dl — AB (ref 70–99)

## 2014-11-11 MED ORDER — PREDNISONE 10 MG PO TABS: ORAL_TABLET | ORAL | Status: DC

## 2014-11-11 MED ORDER — PREDNISOLONE 15 MG/5ML PO SOLN: 60.0000 mg | Freq: Once | ORAL | Status: DC

## 2014-11-11 MED ORDER — ZOLPIDEM TARTRATE 5 MG PO TABS: 5.0000 mg | ORAL_TABLET | Freq: Every evening | ORAL | Status: DC | PRN

## 2014-11-11 NOTE — Patient Instructions (Signed)
Heel Raise: Bilateral (Standing)   Rise on balls of feet, while holding onto counter. Repeat __10__ times per set. Do __3__ sets per session. Do __1__ sessions per day.  http://orth.exer.us/39   Copyright  VHI. All rights reserved.  Hip Abduction / Adduction: with Knee Flexion (Supine)   With both knees bent and blue theraband tied above knees, gently lower knees to side and return. Repeat __10__ times per set. Do __3__ sets per session. Do __1__ sessions per day.  http://orth.exer.us/683   Copyright  VHI. All rights reserved.  Bridge   Lie back, legs bent. Inhale, pressing hips up and march one knee and then the other knee. Keeping tummy tucked in, then slowly lower back down. Repeat __3 sets of 10 reps. Do __1__ sessions per day.  Copyright  VHI. All rights reserved.  ANKLE: Dorsiflexion (Band)   Sit at edge of surface. Placeblue  band around top of foot. Keeping heel on floor, raise toes of banded foot. Hold _2__ seconds. Use __blue______ band. 10___ reps per set, _3__ sets per day, _7__ days per week  Copyright  VHI. All rights reserved.

## 2014-11-11 NOTE — Telephone Encounter (Signed)
Patient presented today after PT. She is experiencing right knee pain with some weakness and increasing gait difficulty. She has not started on definitive treatment for her MS at this time. She has tenderness over knee an obvious difficulty with gait. Dr. Everlene Farrier called Dr. Leta Baptist who suggested initiation of prednisone taper over 6 days. If she is worse tomorrow, he will see her as a work in patient. Patient advised not to drive. Prelene 60 mg will be given in office today. Then 60,50,40,30,20,10. Of note, she had left knee pain predominately while in hospital. Per Dr. Leta Baptist, plan is to start definitive therapy next week.

## 2014-11-11 NOTE — Telephone Encounter (Signed)
This encounter was created in error - please disregard.

## 2014-11-11 NOTE — Therapy (Signed)
[facility] [address] [place], [place], [address] Phone: [phone]   Fax:  [phone]  Physical Therapy Treatment  Patient Details  Name: [name] MRN: [id number] Date of Birth: [birth year]  Encounter Date: 11/11/2014      PT End of Session - 11/11/14 0947    Visit Number 3   Number of Visits 9   Authorization Type UMR   PT Start Time 0804   PT Stop Time 0842   PT Time Calculation (min) 38 min   Equipment Utilized During Treatment Gait belt   Activity Tolerance Patient tolerated treatment well   Behavior During Therapy [facility] for tasks assessed/performed      Past Medical History  Diagnosis Date  . Hypertension   . Smoker   . Allergy   . Anemia   . Depression   . Anxiety   . Sickle cell trait   . Iron overload, transfusional   . Sickle cell anemia     trait  . Clotting disorder     from beeing anemic  . Migraine     Past Surgical History  Procedure Laterality Date  . Cervical ablation    . Tubal ligation    . Cholecystectomy    . Cesarean section      3 times    LMP 10/27/2014  Visit Diagnosis:  Abnormality of gait      Subjective Assessment - 11/11/14 0806    Symptoms Pt denied falls since last visit. Pt did report she tripped but was able to catch herself. Pt reported R knee pain and legs feel "heavy".    Patient Stated Goals walking better, get back to work,    Currently in Pain? Yes   Pain Score 7    Pain Location Knee   Pain Orientation Right   Pain Descriptors / Indicators Aching;Burning   Pain Type Chronic pain   Pain Onset More than a month ago   Pain Frequency Constant   Aggravating Factors  lying down and putting pressure on medial side of knee   Pain Relieving Factors medication       Therex: -Hooklying bridges x10 with TrA activation, bridges with B hip marches and TrA activation 3 x10. VC's for technique and to improve LE eccentric control during  marches. -Hooklying: B hip abduction with red theraband x10, and with blue theraband 3x10. VC's for technique. -Standing: B heel/toe raises x20 with two UEs on counter. Pt could not tolerate toe raises due to R knee pain. VC's for technique. -Seated: B ankle dorsiflexion with blue theraband 3x10. VC's for technique.   Pt required rest breaks after each set due to fatigue and R knee pain.                   PT Education - 11/11/14 0947    Education provided Yes   Education Details Strengthening HEP   Person(s) Educated Patient   Methods Explanation;Demonstration;Tactile cues;Verbal cues;Handout   Comprehension Verbalized understanding;Returned demonstration             PT Long Term Goals - 11/06/14 0854    PT LONG TERM GOAL #1   Title Pt will be independent in HEP to improve functional mobility and strength. Target date: 11/26/14.   Status On-going   PT LONG TERM GOAL #2   Title Perform BERG and write goal if appropriate. Target date: 11/26/14.   Status Achieved   PT LONG TERM GOAL #3   Title Pt  will be able to ambulate >400' over even/uneven terrain, independently without LOB, in order to improve functional mobility. Target date: 11/26/14.   Status On-going   PT LONG TERM GOAL #4   Title Pt will verbalize plans to join gym at PT d/c, to exercise with her daughter and maintain strength gains made in PT. Target date: 11/26/14.   Status On-going   PT LONG TERM GOAL #5   Title Pt will improve ABC score by 10% to improve quality of life. Target date: 11/26/14.   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 Target date: 11/26/14.   Status On-going   PT LONG TERM GOAL #7   Title Perform DGI and write goal if appropriate. Target date: 11/26/14.   Baseline Not appropriate as pt scored 20/24.   Status Achieved   PT LONG TERM GOAL #8   Title Pt will improve BERG score to >/=53/56 to decrease falls risk. Target  date: 11/26/14.   Status New               Plan - 11/11/14 2703    Clinical Impression Statement Pt limited by R knee pain today, and was unable to tolerate standing therex except for heel raises. However, pt tolerated supine and seated therex well. PT will re-attempt standing therex once pt's knee pain decreases, as she would benefit from functional quad strengthening. Continue with POC.   Pt will benefit from skilled therapeutic intervention in order to improve on the following deficits Abnormal gait;Impaired flexibility;Impaired sensation;Obesity;Decreased activity tolerance;Decreased balance;Decreased knowledge of use of DME;Decreased strength;Decreased mobility   Rehab Potential Good   PT Frequency 2x / week   PT Duration 4 weeks   PT Treatment/Interventions ADLs/Self Care Home Management;Gait training;Neuromuscular re-education;Stair training;Biofeedback;Functional mobility training;Patient/family education;Therapeutic activities;Cryotherapy;Electrical Stimulation;Therapeutic exercise;Manual techniques;DME Instruction;Balance training   PT Next Visit Plan Standing strengthening as tolerated, dynamic gait/balance training.   Consulted and Agree with Plan of Care Patient        Problem List Patient Active Problem List   Diagnosis Date Noted  . Tachycardia   . Prediabetes   . MS (multiple sclerosis)   . Multiple sclerosis   . Weakness   . Obesity, Class III, BMI 40-49.9 (morbid obesity) 10/16/2014  . Smoker 10/16/2014  . OSA on CPAP 10/16/2014  . Acute CVA (cerebrovascular accident) 10/16/2014  . Stroke 10/16/2014  . Cough 03/19/2013  . HTN (hypertension) 01/14/2012  . Anemia 01/14/2012  . Sickle cell trait 01/14/2012    Belladonna Lubinski L 11/11/2014, 9:54 AM  [facility] [address] [place], [place], [address] Phone: [phone]   Fax:  [phone]    Geoffry Paradise, PT,DPT 11/11/2014 9:54  AM Phone: [phone] Fax: [phone]

## 2014-11-11 NOTE — Progress Notes (Signed)
Pt here for finger stick sugar only

## 2014-11-11 NOTE — Telephone Encounter (Signed)
Patient getting more right leg weakness, and may represent a flare up. Advised to start prednisone course (60mg  --> taper over 6 days). JCV ab is negative. Will initiate process for tysabri.  Penni Bombard, MD 29/79/8921, 1:94 AM Certified in Neurology, Neurophysiology and Neuroimaging  [facility] [address] [place], [place] [address] [phone]

## 2014-11-11 NOTE — Telephone Encounter (Signed)
Spoke to daughter. She said patient at work. Advised I will call her back after 1pm. Daughter agreed.

## 2014-11-12 ENCOUNTER — Encounter: Payer: Self-pay | Admitting: Emergency Medicine

## 2014-11-12 NOTE — Telephone Encounter (Signed)
FMLA Paperwork completed and faxed by Acquanetta Belling. Patient notified

## 2014-11-13 ENCOUNTER — Ambulatory Visit: Payer: 59 | Admitting: Physical Therapy

## 2014-11-13 ENCOUNTER — Encounter: Payer: Self-pay | Admitting: Physical Therapy

## 2014-11-13 DIAGNOSIS — R269 Unspecified abnormalities of gait and mobility: Secondary | ICD-10-CM

## 2014-11-13 NOTE — Therapy (Signed)
[facility] [address] [place], [place], [address] Phone: [phone]   Fax:  [phone]  Physical Therapy Treatment  Patient Details  Name: [name] MRN: [id number] Date of Birth: 1971-07-06  Encounter Date: 11/13/2014      PT End of Session - 11/13/14 0854    Visit Number 4   Number of Visits 9   Authorization Type UMR   PT Start Time 0804   PT Stop Time 6962   PT Time Calculation (min) 43 min   Activity Tolerance Patient tolerated treatment well   Behavior During Therapy [facility] for tasks assessed/performed      Past Medical History  Diagnosis Date  . Hypertension   . Smoker   . Allergy   . Anemia   . Depression   . Anxiety   . Sickle cell trait   . Iron overload, transfusional   . Sickle cell anemia     trait  . Clotting disorder     from beeing anemic  . Migraine     Past Surgical History  Procedure Laterality Date  . Cervical ablation    . Tubal ligation    . Cholecystectomy    . Cesarean section      3 times    LMP 10/27/2014  Visit Diagnosis:  Abnormality of gait      Subjective Assessment - 11/13/14 0808    Symptoms Denies falls since last visit.  Did go to MD who started Prednisone taper for R knee pain.   Currently in Pain? Yes   Pain Score 8    Pain Location Knee   Pain Orientation Right   Pain Descriptors / Indicators Aching;Burning   Pain Type Chronic pain   Pain Onset More than a month ago   Pain Frequency Constant   Aggravating Factors  weight bearing            [facility] Adult PT Treatment/Exercise - 11/13/14 0815    Transfers   Transfers Sit to Stand;Stand to Sit   Sit to Stand 5: Supervision;With upper extremity assist;From chair/3-in-1   Stand to Sit 5: Supervision;With upper extremity assist;To chair/3-in-1   Stand to Sit Details verbal cues to control descent   Lumbar Exercises: Supine   Bridge --   Bridge Limitations --   Knee/Hip Exercises:  Standing   Heel Raises 20 reps;2 seconds   Other Standing Knee Exercises in parallel bars standing on L LE for R LE hip extension, SLR, hamstring curl,-all x 20 reps   Knee/Hip Exercises: Seated   Long Arc Quad Both;2 sets;10 reps   Other Seated Knee Exercises seated bil marching x 10 x 2   Knee/Hip Exercises: Supine   Quad Sets Both;20 reps   Short Arc Quad Sets Both;2 sets;15 reps  3# weight on Left   Heel Slides Both;2 sets;10 reps  3# weight on Left   Bridges Left;2 sets;10 reps   Bridges Limitations L side only due to R knee pain   Straight Leg Raises Left;2 sets;15 reps  3# weight   Other Supine Knee Exercises hooklying bil hip abd with blue theraband then single leg hip abduction with same   Ankle Exercises: Seated   Heel Raises 20 reps   Toe Raise 20 reps                PT Education - 11/13/14 0854    Education provided Yes   Education Details Avoiding heat with MS diagnosis, continue HEP given  previous visit   Person(s) Educated Patient   Methods Explanation   Comprehension Verbalized understanding             PT Long Term Goals - 11/06/14 0854    PT LONG TERM GOAL #1   Title Pt will be independent in HEP to improve functional mobility and strength. Target date: 11/26/14.   Status On-going   PT LONG TERM GOAL #2   Title Perform BERG and write goal if appropriate. Target date: 11/26/14.   Status Achieved   PT LONG TERM GOAL #3   Title Pt will be able to ambulate >400' over even/uneven terrain, independently without LOB, in order to improve functional mobility. Target date: 11/26/14.   Status On-going   PT LONG TERM GOAL #4   Title Pt will verbalize plans to join gym at PT d/c, to exercise with her daughter and maintain strength gains made in PT. Target date: 11/26/14.   Status On-going   PT LONG TERM GOAL #5   Title Pt will improve ABC score by 10% to improve quality of life. Target date: 11/26/14.   Status On-going   PT LONG TERM GOAL #6   Title Pt  will verbalize she returned to work (full-time) at PT d/c. Target date: 11/26/14.   Status On-going   PT LONG TERM GOAL #7   Title Perform DGI and write goal if appropriate. Target date: 11/26/14.   Baseline Not appropriate as pt scored 20/24.   Status Achieved   PT LONG TERM GOAL #8   Title Pt will improve BERG score to >/=53/56 to decrease falls risk. Target date: 11/26/14.   Status New               Plan - 11/13/14 0855    Clinical Impression Statement Pt continues to be limited by R knee pain but reports she feels better than Tuesday's session.  Continue per POC as tolerated.   Pt will benefit from skilled therapeutic intervention in order to improve on the following deficits Abnormal gait;Impaired flexibility;Impaired sensation;Obesity;Decreased activity tolerance;Decreased balance;Decreased knowledge of use of DME;Decreased strength;Decreased mobility   Rehab Potential Good   PT Frequency 2x / week   PT Duration 4 weeks   PT Treatment/Interventions ADLs/Self Care Home Management;Gait training;Neuromuscular re-education;Stair training;Biofeedback;Functional mobility training;Patient/family education;Therapeutic activities;Cryotherapy;Electrical Stimulation;Therapeutic exercise;Manual techniques;DME Instruction;Balance training   PT Next Visit Plan Standing strengthening as tolerated, dynamic gait/balance training.   Consulted and Agree with Plan of Care Patient        Problem List Patient Active Problem List   Diagnosis Date Noted  . Tachycardia   . Prediabetes   . MS (multiple sclerosis)   . Multiple sclerosis   . Weakness   . Obesity, Class III, BMI 40-49.9 (morbid obesity) 10/16/2014  . Smoker 10/16/2014  . OSA on CPAP 10/16/2014  . Acute CVA (cerebrovascular accident) 10/16/2014  . Stroke 10/16/2014  . Cough 03/19/2013  . HTN (hypertension) 01/14/2012  . Anemia 01/14/2012  . Sickle cell trait 01/14/2012    Narda Bonds 11/13/2014, 8:58 AM  [facility] [address] [place], [place], [address] Phone: [phone]   Fax:  [place], [place] 11/13/2014 8:58 AM Phone: [phone] Fax: [phone]

## 2014-11-18 ENCOUNTER — Ambulatory Visit: Payer: 59 | Attending: Emergency Medicine

## 2014-11-18 DIAGNOSIS — R269 Unspecified abnormalities of gait and mobility: Secondary | ICD-10-CM | POA: Diagnosis present

## 2014-11-18 NOTE — Therapy (Signed)
[facility] [address] [place], [place], [address] Phone: [phone]   Fax:  [phone]  Physical Therapy Treatment  Patient Details  Name: [name] MRN: [id number] Date of Birth: Nov 26, 1970  Encounter Date: 11/18/2014      PT End of Session - 11/18/14 0856    Visit Number 5   Number of Visits 9   Date for PT Re-Evaluation 11/28/14   Authorization Type UMR   PT Start Time 0803   PT Stop Time 0845   PT Time Calculation (min) 42 min   Equipment Utilized During Treatment Gait belt   Activity Tolerance Patient tolerated treatment well   Behavior During Therapy [facility] for tasks assessed/performed      Past Medical History  Diagnosis Date  . Hypertension   . Smoker   . Allergy   . Anemia   . Depression   . Anxiety   . Sickle cell trait   . Iron overload, transfusional   . Sickle cell anemia     trait  . Clotting disorder     from beeing anemic  . Migraine     Past Surgical History  Procedure Laterality Date  . Cervical ablation    . Tubal ligation    . Cholecystectomy    . Cesarean section      3 times    LMP 10/27/2014  Visit Diagnosis:  Abnormality of gait      Subjective Assessment - 11/18/14 0804    Symptoms Pt denied falls or changes since last visit. Pt reported she is done with Prednisone. Pt reported legs and low back feel tired and she's not able to walk long distances.   Patient Stated Goals walking better, get back to work,    Currently in Pain? Yes   Pain Score 6    Pain Location Knee   Pain Orientation Right   Pain Descriptors / Indicators Aching;Burning   Pain Type Chronic pain   Pain Onset More than a month ago   Pain Frequency Constant   Aggravating Factors  weight bearing   Pain Relieving Factors medication                    [facility] Adult PT Treatment/Exercise - 11/18/14 0808    Ambulation/Gait   Ambulation/Gait Yes   Ambulation/Gait Assistance 5:  Supervision   Ambulation/Gait Assistance Details Pt ambulated forward/lateral/backwards (4x7'/activity on mat) over compliant (with and without bean bags under mat) and non-compliant surfaces, while performing head turns. Pt ambulated over compliant and non-compliant surface while holding bean bag basket to mimic work duites, pt also placed basket on high shelf to mimic work duties. VC's for proper mechanics for lifting and to not rotate spine while lifting/reaching and to improve heel strike on compliant surfaces.   Ambulation Distance (Feet) --  117', 75', 16x7', 300' with basket   Assistive device None   Gait Pattern Step-through pattern;Decreased dorsiflexion - left;Trendelenburg   Stairs Yes   Stair Management Technique No rails  while carrying basket   Number of Stairs 4   Height of Stairs 6   Ramp 5: Supervision   Ramp Details (indicate cue type and reason) Pt ascended/descended ramp with supervision with no LOB.   Balance   Balance Assessed Yes   Dynamic Standing Balance   Dynamic Standing - Balance Support No upper extremity supported   Dynamic Standing - Level of Assistance Other (comment)  min guard   Dynamic Standing - Balance  Activities Alternating  foot traps;Other (comment)   Dynamic Standing - Comments Pt performed cone tap (single, double, triple, and crossover taps 6x4/activity on compliant surface), figure eights while holding bean bag basket x2, and braiding x4 over compliant surfaces. Pt tossed bean bags into basket, while standing on compliant surface to reach outside BOS x10. VC's for weight shifting. Pt required seated rest breaks during session due to fatigue and pain.                PT Education - 11/18/14 0855    Education provided Yes   Education Details Educated pt on placing feet on 3 inch shelf while standing to wash dishes or after ambulating longer distances to relieve low back pain. PT educated pt on proper body mechanics while lifting, and to not  rotate spine.   Person(s) Educated Patient   Methods Explanation;Demonstration;Verbal cues   Comprehension Verbalized understanding;Returned demonstration             PT Long Term Goals - 11/06/14 0854    PT LONG TERM GOAL #1   Title Pt will be independent in HEP to improve functional mobility and strength. Target date: 11/26/14.   Status On-going   PT LONG TERM GOAL #2   Title Perform BERG and write goal if appropriate. Target date: 11/26/14.   Status Achieved   PT LONG TERM GOAL #3   Title Pt will be able to ambulate >400' over even/uneven terrain, independently without LOB, in order to improve functional mobility. Target date: 11/26/14.   Status On-going   PT LONG TERM GOAL #4   Title Pt will verbalize plans to join gym at PT d/c, to exercise with her daughter and maintain strength gains made in PT. Target date: 11/26/14.   Status On-going   PT LONG TERM GOAL #5   Title Pt will improve ABC score by 10% to improve quality of life. Target date: 11/26/14.   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 Target date: 11/26/14.   Status On-going   PT LONG TERM GOAL #7   Title Perform DGI and write goal if appropriate. Target date: 11/26/14.   Baseline Not appropriate as pt scored 20/24.   Status Achieved   PT LONG TERM GOAL #8   Title Pt will improve BERG score to >/=53/56 to decrease falls risk. Target date: 11/26/14.   Status New               Plan - 11/18/14 0857    Clinical Impression Statement Pt demonstrated progress, as she was able to tolerate dynamic gait and balance acitivies. Pt continues to be limited by fatigue and pain, as she required rest breaks during session. Pt would continue to benefit from skilled PT to improve safety during functional mobility.   Pt will benefit from skilled therapeutic intervention in order to improve on the following deficits Abnormal gait;Impaired flexibility;Impaired  sensation;Obesity;Decreased activity tolerance;Decreased balance;Decreased knowledge of use of DME;Decreased strength;Decreased mobility   Rehab Potential Good   PT Frequency 2x / week   PT Duration 4 weeks   PT Treatment/Interventions ADLs/Self Care Home Management;Gait training;Neuromuscular re-education;Stair training;Biofeedback;Functional mobility training;Patient/family education;Therapeutic activities;Cryotherapy;Electrical Stimulation;Therapeutic exercise;Manual techniques;DME Instruction;Balance training   PT Next Visit Plan Standing strengthening as tolerated, and continue dynamic gait/balance training.   Consulted and Agree with Plan of Care Patient        Problem List Patient Active Problem List  Diagnosis Date Noted  . Tachycardia   . Prediabetes   . MS (multiple sclerosis)   . Multiple sclerosis   . Weakness   . Obesity, Class III, BMI 40-49.9 (morbid obesity) 10/16/2014  . Smoker 10/16/2014  . OSA on CPAP 10/16/2014  . Acute CVA (cerebrovascular accident) 10/16/2014  . Stroke 10/16/2014  . Cough 03/19/2013  . HTN (hypertension) 01/14/2012  . Anemia 01/14/2012  . Sickle cell trait 01/14/2012    Miller,Jennifer L 11/18/2014, 9:12 AM  [facility] [address] [place], [place], [address] Phone: [phone]   Fax:  [phone]   Geoffry Paradise, PT,DPT 11/18/2014 9:12 AM Phone: [phone] Fax: [phone]

## 2014-11-19 ENCOUNTER — Encounter: Payer: Self-pay | Admitting: Emergency Medicine

## 2014-11-19 ENCOUNTER — Telehealth: Payer: Self-pay | Admitting: Radiology

## 2014-11-19 NOTE — Telephone Encounter (Signed)
I have spoken to Dr Everlene Farrier and also to [name]. She has asked to return to work full hours. Dr Everlene Farrier has advised she can return full hours, but 4 hours must be seated work. Note provided per Dr Everlene Farrier.

## 2014-11-20 ENCOUNTER — Ambulatory Visit: Payer: 59

## 2014-11-24 NOTE — Telephone Encounter (Signed)
[employer] sent back FMLA ppw wanting revisions to be made. I printed out the blank form in Angies chart, and refilled out the information, making adjustments where [name] and I believed revisions were needed. I highlighted where changes were made for review by Dr. Everlene Farrier. Question #5 has been left blank for Dr. Caren Griffins input. Will place this in Dr. Caren Griffins box, may need new signature with date. Please return to Disability Department or [name] when complete.

## 2014-11-24 NOTE — Telephone Encounter (Signed)
I called [employer] to get clarification on what else is needed for patient's FMLA paperwork. Pennie Banter, who is the examiner for this case, states that [name] needs specific end dates for how long patient will need to work 4 hour days, 2 days a week. Start date for patient's restrictions began on 11/11/2014 and [facility] requires an ending date (even if it is just an estimate) as opposed to saying "unknown". I am working tomorrow and will be happy to sit down with you and write in the dates that you think patient will need to be on restrictions. I am holding on to the old FMLA paperwork and will come to the appointment center tomorrow to discuss the dates.  Thanks, [employer]

## 2014-11-25 ENCOUNTER — Ambulatory Visit: Payer: 59

## 2014-11-25 NOTE — Telephone Encounter (Signed)
If you could come down to 104 tomorrow and help me with this I would greatly appreciated

## 2014-11-26 ENCOUNTER — Other Ambulatory Visit: Payer: Self-pay | Admitting: Family Medicine

## 2014-11-26 DIAGNOSIS — I10 Essential (primary) hypertension: Secondary | ICD-10-CM

## 2014-11-26 MED ORDER — TELMISARTAN-HCTZ 80-12.5 MG PO TABS: ORAL_TABLET | ORAL | Status: DC

## 2014-11-27 ENCOUNTER — Encounter: Payer: Self-pay | Admitting: Emergency Medicine

## 2014-11-27 ENCOUNTER — Ambulatory Visit: Payer: 59

## 2014-12-01 ENCOUNTER — Encounter: Payer: Self-pay | Admitting: Diagnostic Neuroimaging

## 2014-12-01 ENCOUNTER — Telehealth: Payer: Self-pay | Admitting: *Deleted

## 2014-12-01 ENCOUNTER — Ambulatory Visit (INDEPENDENT_AMBULATORY_CARE_PROVIDER_SITE_OTHER): Payer: 59 | Admitting: Diagnostic Neuroimaging

## 2014-12-01 VITALS — BP 139/100 | HR 122 | Temp 97.6°F | Ht 63.0 in | Wt 302.2 lb

## 2014-12-01 DIAGNOSIS — G35 Multiple sclerosis: Secondary | ICD-10-CM

## 2014-12-01 DIAGNOSIS — M25561 Pain in right knee: Secondary | ICD-10-CM

## 2014-12-01 NOTE — Patient Instructions (Signed)
We will proceed with tysabri.

## 2014-12-01 NOTE — Progress Notes (Signed)
[facility]  PATIENT: [name] DOB: 07/07/1971  REFERRING CLINICIAN:  HISTORY FROM: patient and mother REASON FOR VISIT: follow up   HISTORICAL  CHIEF COMPLAINT:  Chief Complaint  Patient presents with  . Follow-up    multiple sclerosis     HISTORY OF PRESENT ILLNESS:   UPDATE 12/01/14: Since last visit, left side weakness has improved. Now having more problems with right knee pain, esp walking longer distances. Awaiting tysabri scheduling.   PRIOR HPI (10/29/14): 44 year old right-handed female here for evaluation of multiple sclerosis. 10/15/14 patient developed staggering to the left side. The next day she noted left arm and left leg heaviness and weakness. Patient was in the hospital and had evaluation. MRI of the brain demonstrated multiple brain lesions suspicious for demyelinating disease. Patient was treated with IV steroids and her left sided symptoms have almost completely resolved. Patient continues to have a slight limp with left leg difficulty. Also has some slight incoordination with left hand. In the past patient has had intermittent headaches. Patient has also had intermittent episodes of knee pain and intermittent vertigo attacks. Patient has family history of multiple sclerosis in maternal uncle and maternal cousin.    REVIEW OF SYSTEMS: Full 14 system review of systems performed and notable only for shortness of breath snoring sleep apnea on CPAP.  ALLERGIES: Allergies  Allergen Reactions  . Iron Shortness Of Breath    IV only   . Delsym [Dextromethorphan Polistirex Er] Other (See Comments)    nightmares  . Hydrocodone-Guaifenesin Other (See Comments)    nightmares  . Doxycycline Other (See Comments)    Bad taste to patient    HOME MEDICATIONS: Outpatient Prescriptions Prior to Visit  Medication Sig Dispense Refill  . aspirin EC 81 MG tablet Take 1 tablet (81 mg total) by mouth daily. 30 tablet 0  . atorvastatin (LIPITOR) 40 MG  tablet Take 1 tablet (40 mg total) by mouth daily at 6 PM. 30 tablet 0  . calcium carbonate (TUMS - DOSED IN MG ELEMENTAL CALCIUM) 500 MG chewable tablet Chew 1 tablet by mouth as needed for indigestion or heartburn.    . ergocalciferol (DRISDOL) 50000 UNITS capsule Take 1 capsule (50,000 Units total) by mouth once a week. 4 capsule 5  . furosemide (LASIX) 20 MG tablet Take 20 mg by mouth as needed for fluid.    Marland Kitchen ibuprofen (ADVIL,MOTRIN) 800 MG tablet Take 800 mg by mouth every 8 (eight) hours as needed.    . predniSONE (DELTASONE) 10 MG tablet Take 6 tomorrow, then 5,4,3,2,1 21 tablet 0  . rizatriptan (MAXALT) 10 MG tablet Take 1 tablet (10 mg total) by mouth as needed for migraine. May repeat in 2 hours if needed 10 tablet 0  . telmisartan-hydrochlorothiazide (MICARDIS HCT) 80-12.5 MG per tablet 1 tab po qd 90 tablet 3  . zolpidem (AMBIEN) 5 MG tablet Take 1 tablet (5 mg total) by mouth at bedtime as needed for sleep. 15 tablet 0   Facility-Administered Medications Prior to Visit  Medication Dose Route Frequency Provider Last Rate Last Dose  . prednisoLONE (PRELONE) 15 MG/5ML SOLN 60 mg  60 mg Oral Once [name], MD        PAST MEDICAL HISTORY: Past Medical History  Diagnosis Date  . Hypertension   . Smoker   . Allergy   . Anemia   . Depression   . Anxiety   . Sickle cell trait   . Iron overload, transfusional   .  Sickle cell anemia     trait  . Clotting disorder     from beeing anemic  . Migraine     PAST SURGICAL HISTORY: Past Surgical History  Procedure Laterality Date  . Cervical ablation    . Tubal ligation    . Cholecystectomy    . Cesarean section      3 times    FAMILY HISTORY: Family History  Problem Relation Age of Onset  . Mental illness Mother   . Hyperlipidemia Mother   . Hyperthyroidism Mother   . Mental retardation Mother   . ADD / ADHD Son   . Diabetes Father   . Cancer Father   . Cancer Paternal Aunt     breast  . Arthritis Maternal  Grandmother   . Diabetes Maternal Grandmother   . Hearing loss Maternal Grandmother     left hear when she was a child  . Hyperlipidemia Maternal Grandmother   . Hypertension Maternal Grandmother   . Alcohol abuse Maternal Grandfather   . Early death Maternal Grandfather   . Cancer Paternal Aunt     breast  . Multiple sclerosis Cousin     SOCIAL HISTORY:  History   Social History  . Marital Status: Single    Spouse Name: N/A    Number of Children: 3  . Years of Education: 12+   Occupational History  . Not on file.   Social History Main Topics  . Smoking status: Former Smoker -- 0.25 packs/day for 20 years    Types: Cigarettes    Quit date: 10/16/2014  . Smokeless tobacco: Never Used     Comment: smoke 5 cigarettes/day or prn  . Alcohol Use: 0.0 oz/week    0 Not specified per week     Comment: once maybe a month if that  . Drug Use: No  . Sexual Activity:    Partners: Male    Birth Control/ Protection: Condom   Other Topics Concern  . Not on file   Social History Narrative   Patient lives at home with her family.   Caffeine Use: 1 cup daily     PHYSICAL EXAM  Filed Vitals:   12/01/14 1328  BP: 139/100  Pulse: 122  Temp: 97.6 F (36.4 C)  TempSrc: Oral  Height: 5\' 3"  (1.6 m)  Weight: 302 lb 3.2 oz (137.077 kg)    Body mass index is 53.55 kg/(m^2).   Visual Acuity Screening   Right eye Left eye Both eyes  Without correction: 20/30 20/30   With correction:       No flowsheet data found.  GENERAL EXAM: Patient is in no distress; well developed, nourished and groomed; neck is supple  CARDIOVASCULAR: Regular rate and rhythm, no murmurs, no carotid bruits  NEUROLOGIC: MENTAL STATUS: awake, alert, language fluent, comprehension intact, naming intact, fund of knowledge appropriate CRANIAL NERVE: no papilledema on fundoscopic exam, pupils equal and reactive to light, visual fields full to confrontation, extraocular muscles intact, no nystagmus,  facial sensation and strength symmetric, hearing intact, palate elevates symmetrically, uvula midline, shoulder shrug symmetric, tongue midline. MOTOR: normal bulk and tone, full strength in the RUE, RLE; SLIGHTLY DECR COORDINATION IN LUE AND LLE SENSORY: normal and symmetric to light touch, pinprick, temperature, vibration and proprioception COORDINATION: finger-nose-finger, fine finger movements SLOW ON LEFT SIDE; NORMAL ON RIGHT.  REFLEXES: deep tendon reflexes present and symmetric GAIT/STATION: narrow based gait; SLOW CAREFUL GAIT; SLIGHTL LIMP WITH LEFT LEG    DIAGNOSTIC DATA (LABS, IMAGING,  TESTING) - I reviewed patient records, labs, notes, testing and imaging myself where available.  Lab Results  Component Value Date   WBC 17.8* 10/22/2014   HGB 11.8* 10/22/2014   HCT 36.3 10/22/2014   MCV 73.3* 10/22/2014   PLT 229 10/22/2014      Component Value Date/Time   NA 141 10/20/2014 0625   K 4.0 10/20/2014 0625   CL 101 10/20/2014 0625   CO2 25 10/20/2014 0625   GLUCOSE 146* 10/20/2014 0625   BUN 23 10/20/2014 0625   CREATININE 0.81 10/20/2014 0625   CREATININE 0.61 08/21/2014 1440   CALCIUM 10.6* 10/20/2014 0625   PROT 7.7 10/16/2014 1557   ALBUMIN 3.9 10/16/2014 1557   AST 17 10/16/2014 1557   ALT 24 10/16/2014 1557   ALKPHOS 87 10/16/2014 1557   BILITOT 0.2* 10/16/2014 1557   GFRNONAA 86* 10/20/2014 0625   GFRAA >90 10/20/2014 0625   Lab Results  Component Value Date   CHOL 170 10/17/2014   HDL 36* 10/17/2014   LDLCALC 97 10/17/2014   TRIG 185* 10/17/2014   CHOLHDL 4.7 10/17/2014   Lab Results  Component Value Date   HGBA1C 6.1* 10/17/2014   No results found for: [id number] Lab Results  Component Value Date   TSH 0.904 12/12/2013   VIT D, 25-HYDROXY  Date Value Ref Range Status  08/21/2014 12* 30 - 89 ng/mL Final    Comment:    This assay accurately quantifies Vitamin D, which is the sum of the 25-Hydroxy forms of Vitamin D2 and D3.  Studies have  shown that the optimum concentration of 25-Hydroxy Vitamin D is 30 ng/mL or higher.  Concentrations of Vitamin D between 20 and 29 ng/mL are considered to be insufficient and concentrations less than 20 ng/mL are considered to be deficient for Vitamin D.   I reviewed images myself and agree with interpretation. -VRP  10/17/14 MRI brain (without)  1. Multiple T2/FLAIR hyperintense lesions with associated restricted diffusion involving the white matter of both cerebral hemispheres, right greater than left, most consistent with active demyelinating disease. 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 Follow-up examination with postcontrast imaging could be performed for complete evaluation. 2. No other findings to suggest acute intracranial infarct or other process.  10/17/14 MRI cervical spine (with and without)  - Motion degraded exam demonstrating no convincing evidence for spinal MS.  Certainly there are no areas of T2 hyperintensity or cord enlargement. No disc protrusion or spinal stenosis.  10/18/14 MRI brain (with) - Findings consistent with acute demyelination in the RIGHT posterior frontal subcortical white matter in this patient with suspected multiple sclerosis. Multiple other areas of T2 signal hyperintensity on the previous study do not display similar postcontrast enhancement. 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   10/21/14 CSF opening pressure 19cm H2O - (WBC 18, RBC 3545, protein 50, glucose 110, cryptococcal ag negative, IgG index 7.1 (normal), OCB > 5 well defined gamma restriction bands)  10/29/14 JCV ab - negative     ASSESSMENT AND PLAN  44 y.o. 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 Diagnosis, prognosis and treatment options reviewed with patient and family.  Will check additional blood testing to rule out mimicking conditions. Will check JC virus antibody status to see if she qualifies for Tysabri.  PLAN: - start tysabri (process already initiated)  Return in about 3 months (around 03/02/2015).    Penni Bombard, MD 0/02/5996, 7:41 PM Certified in Neurology, Neurophysiology and Neuroimaging  [facility] [address] [place],  [address] [phone]

## 2014-12-04 NOTE — Telephone Encounter (Signed)
Encounter complete. 

## 2014-12-09 ENCOUNTER — Telehealth: Payer: Self-pay

## 2014-12-09 ENCOUNTER — Encounter: Payer: Self-pay | Admitting: Emergency Medicine

## 2014-12-09 NOTE — Telephone Encounter (Signed)
Updated work status request was faxed.  Did we receive this?  If not call Katie at [phone]

## 2014-12-09 NOTE — Telephone Encounter (Signed)
I have received and faxed the note

## 2014-12-09 NOTE — Telephone Encounter (Signed)
Dr. Everlene Farrier did you receive this?

## 2014-12-17 ENCOUNTER — Ambulatory Visit (INDEPENDENT_AMBULATORY_CARE_PROVIDER_SITE_OTHER): Payer: 59 | Admitting: Emergency Medicine

## 2014-12-17 ENCOUNTER — Ambulatory Visit (INDEPENDENT_AMBULATORY_CARE_PROVIDER_SITE_OTHER): Payer: 59

## 2014-12-17 VITALS — BP 138/88 | HR 118 | Temp 99.3°F | Resp 17 | Ht 63.5 in | Wt 303.0 lb

## 2014-12-17 DIAGNOSIS — D72829 Elevated white blood cell count, unspecified: Secondary | ICD-10-CM

## 2014-12-17 DIAGNOSIS — B349 Viral infection, unspecified: Secondary | ICD-10-CM

## 2014-12-17 LAB — POCT INFLUENZA A/B
Influenza A, POC: NEGATIVE
Influenza B, POC: NEGATIVE

## 2014-12-17 LAB — POCT CBC
Granulocyte percent: 77.9 %G (ref 37–80)
HEMATOCRIT: 37.8 % (ref 37.7–47.9)
Hemoglobin: 11.8 g/dL — AB (ref 12.2–16.2)
LYMPH, POC: 2.6 (ref 0.6–3.4)
MCH, POC: 23.6 pg — AB (ref 27–31.2)
MCHC: 31.3 g/dL — AB (ref 31.8–35.4)
MCV: 75.3 fL — AB (ref 80–97)
MID (CBC): 0.9 (ref 0–0.9)
MPV: 10.2 fL (ref 0–99.8)
POC Granulocyte: 12.3 — AB (ref 2–6.9)
POC LYMPH %: 16.3 % (ref 10–50)
POC MID %: 5.8 % (ref 0–12)
Platelet Count, POC: 275 10*3/uL (ref 142–424)
RBC: 5.02 M/uL (ref 4.04–5.48)
RDW, POC: 17.4 %
WBC: 15.8 10*3/uL — AB (ref 4.6–10.2)

## 2014-12-17 NOTE — Progress Notes (Signed)
Subjective:    Patient ID: [name], female    DOB: 20-Aug-1971, 44 y.o.   MRN: [id number]   PCP: Jenny Reichmann, MD  Chief Complaint  Patient presents with  . Headache  . Generalized Body Aches  . Dizziness  . Nasal Congestion  . Sore Throat  . Chills    Allergies  Allergen Reactions  . Iron Shortness Of Breath    IV only   . Delsym [Dextromethorphan Polistirex Er] Other (See Comments)    nightmares  . Hydrocodone-Guaifenesin Other (See Comments)    nightmares  . Doxycycline Other (See Comments)    Bad taste to patient    Patient Active Problem List   Diagnosis Date Noted  . Prediabetes   . MS (multiple sclerosis)   . Obesity, Class III, BMI 40-49.9 (morbid obesity) 10/16/2014  . Smoker 10/16/2014  . OSA on CPAP 10/16/2014  . HTN (hypertension) 01/14/2012  . Sickle cell trait 01/14/2012    Prior to Admission medications   Medication Sig Start Date End Date Taking? Authorizing Provider  aspirin EC 81 MG tablet Take 1 tablet (81 mg total) by mouth daily. 10/22/14  Yes Elberta Leatherwood, MD  ergocalciferol (DRISDOL) 50000 UNITS capsule Take 1 capsule (50,000 Units total) by mouth once a week. 08/25/14 08/25/15 Yes Barton Fanny, MD  telmisartan-hydrochlorothiazide (MICARDIS HCT) 80-12.5 MG per tablet 1 tab po qd 11/26/14  Yes [name], MD  zolpidem (AMBIEN) 5 MG tablet Take 1 tablet (5 mg total) by mouth at bedtime as needed for sleep. 11/11/14  Yes Elby Beck, FNP    Medical, Surgical, Family and Social History reviewed and updated.  HPI  Yesterday at work she was feeling tired and a little dizzy, common with her recent diagnosis of MS. Upon arriving home, she went to bed, and developed chills, then fever, achiness in her joints. She then developed congestion, HA, drainage and sore throat. Took OTC Theraflu helped some, and so she came on to work this morning. After arriving at work, her symptoms recurred, and she presents for evaluation.  No  nausea, vomiting, diarrhea. Increased urinary frequency (she read this can be an effect of MS), and darker in color. But no no urgency or dysuria, hematuria. Skin feels sensitive.  No cough. No blurred vision. No double vision.  Review of Systems As above.    Objective:   Physical Exam  Constitutional: She is oriented to person, place, and time. Vital signs are normal. She appears well-developed and well-nourished. No distress.  BP 138/88 mmHg  Pulse 118  Temp(Src) 99.3 F (37.4 C) (Oral)  Resp 17  Ht 5' 3.5" (1.613 m)  Wt 303 lb (137.44 kg)  BMI 52.83 kg/m2  SpO2 98%  LMP 11/25/2014   HENT:  Head: Normocephalic and atraumatic.  Right Ear: Hearing, tympanic membrane, external ear and ear canal normal.  Left Ear: Hearing, tympanic membrane, external ear and ear canal normal.  Nose: Mucosal edema and rhinorrhea present.  No foreign bodies. Right sinus exhibits no maxillary sinus tenderness and no frontal sinus tenderness. Left sinus exhibits no maxillary sinus tenderness and no frontal sinus tenderness.  Mouth/Throat: Uvula is midline, oropharynx is clear and moist and mucous membranes are normal. No uvula swelling. No oropharyngeal exudate.  Eyes: Conjunctivae and EOM are normal. Pupils are equal, round, and reactive to light. Right eye exhibits no discharge. Left eye exhibits no discharge. No scleral icterus.  Neck: Trachea normal, normal range of motion and  full passive range of motion without pain. Neck supple. No thyroid mass and no thyromegaly present.  Cardiovascular: Regular rhythm and normal heart sounds.   No extrasystoles are present. Tachycardia present.   Pulses:      Radial pulses are 2+ on the right side, and 2+ on the left side.  Pulmonary/Chest: Effort normal and breath sounds normal.  Lymphadenopathy:       Head (right side): No submandibular, no tonsillar, no preauricular, no posterior auricular and no occipital adenopathy present.       Head (left side): No  submandibular, no tonsillar, no preauricular and no occipital adenopathy present.    She has no cervical adenopathy.       Right: No supraclavicular adenopathy present.       Left: No supraclavicular adenopathy present.  Neurological: She is alert and oriented to person, place, and time. She has normal strength. No cranial nerve deficit or sensory deficit.  Skin: Skin is warm, dry and intact. No rash noted.  Psychiatric: She has a normal mood and affect. Her speech is normal and behavior is normal.    Results for orders placed or performed in visit on 12/17/14  POCT Influenza A/B  Result Value Ref Range   Influenza A, POC Negative    Influenza B, POC Negative   POCT CBC  Result Value Ref Range   WBC 15.8 (A) 4.6 - 10.2 K/uL   Lymph, poc 2.6 0.6 - 3.4   POC LYMPH PERCENT 16.3 10 - 50 %L   MID (cbc) 0.9 0 - 0.9   POC MID % 5.8 0 - 12 %M   POC Granulocyte 12.3 (A) 2 - 6.9   Granulocyte percent 77.9 37 - 80 %G   RBC 5.02 4.04 - 5.48 M/uL   Hemoglobin 11.8 (A) 12.2 - 16.2 g/dL   HCT, POC 37.8 37.7 - 47.9 %   MCV 75.3 (A) 80 - 97 fL   MCH, POC 23.6 (A) 27 - 31.2 pg   MCHC 31.3 (A) 31.8 - 35.4 g/dL   RDW, POC 17.4 %   Platelet Count, POC 275 142 - 424 K/uL   MPV 10.2 0 - 99.8 fL   CXR: [facility] reading (PRIMARY) by  Dr. Everlene Farrier. No infiltrate or other acute process.        Assessment & Plan:  1. Viral syndrome - POCT Influenza A/B - POCT CBC  2. Leucocytosis - DG Chest 2 View; Future  Suspect this is a viral syndrome, but leucocytosis raises the concern for bacterial process evolving. Elect against Tamiflu due to potential of neurologic side effects that would be difficult to distinguish from MS symptoms. She'll use OTC Theraflu, since it helped last night, rest and drink plenty of fluids.  She'll be in close contact with me if she develops any new symptoms. She may RTW on Friday 2/05 if her symptoms have resolved.  Fara Chute, PA-C Physician Assistant-Certified [facility]

## 2014-12-17 NOTE — Patient Instructions (Signed)
Get plenty of rest and drink at least 64 ounces of water daily.  Use the Theraflu, since it worked well last night. Contact me if you develop any new symptoms.

## 2014-12-18 ENCOUNTER — Ambulatory Visit (INDEPENDENT_AMBULATORY_CARE_PROVIDER_SITE_OTHER): Payer: 59 | Admitting: Family Medicine

## 2014-12-18 VITALS — BP 120/90 | HR 116 | Temp 99.3°F | Resp 16

## 2014-12-18 DIAGNOSIS — J029 Acute pharyngitis, unspecified: Secondary | ICD-10-CM

## 2014-12-18 LAB — POCT RAPID STREP A (OFFICE): Rapid Strep A Screen: NEGATIVE

## 2014-12-18 MED ORDER — AMOXICILLIN 875 MG PO TABS: 875.0000 mg | ORAL_TABLET | Freq: Two times a day (BID) | ORAL | Status: DC

## 2014-12-18 NOTE — Patient Instructions (Addendum)
Drink plenty of fluids and get enough rest  Take Tylenol or ibuprofen for the pain and fever  Take amoxicillin one twice daily. If strep culture comes back negative you can discontinue it  Return if worse at any time  Pharyngitis Pharyngitis is redness, pain, and swelling (inflammation) of your pharynx.  CAUSES  Pharyngitis is usually caused by infection. Most of the time, these infections are from viruses (viral) and are part of a cold. However, sometimes pharyngitis is caused by bacteria (bacterial). Pharyngitis can also be caused by allergies. Viral pharyngitis may be spread from person to person by coughing, sneezing, and personal items or utensils (cups, forks, spoons, toothbrushes). Bacterial pharyngitis may be spread from person to person by more intimate contact, such as kissing.  SIGNS AND SYMPTOMS  Symptoms of pharyngitis include:   Sore throat.   Tiredness (fatigue).   Low-grade fever.   Headache.  Joint pain and muscle aches.  Skin rashes.  Swollen lymph nodes.  Plaque-like film on throat or tonsils (often seen with bacterial pharyngitis). DIAGNOSIS  Your health care provider will ask you questions about your illness and your symptoms. Your medical history, along with a physical exam, is often all that is needed to diagnose pharyngitis. Sometimes, a rapid strep test is done. Other lab tests may also be done, depending on the suspected cause.  TREATMENT  Viral pharyngitis will usually get better in 3-4 days without the use of medicine. Bacterial pharyngitis is treated with medicines that kill germs (antibiotics).  HOME CARE INSTRUCTIONS   Drink enough water and fluids to keep your urine clear or pale yellow.   Only take over-the-counter or prescription medicines as directed by your health care provider:   If you are prescribed antibiotics, make sure you finish them even if you start to feel better.   Do not take aspirin.   Get lots of rest.   Gargle  with 8 oz of salt water ( tsp of salt per 1 qt of water) as often as every 1-2 hours to soothe your throat.   Throat lozenges (if you are not at risk for choking) or sprays may be used to soothe your throat. SEEK MEDICAL CARE IF:   You have large, tender lumps in your neck.  You have a rash.  You cough up green, yellow-brown, or bloody spit. SEEK IMMEDIATE MEDICAL CARE IF:   Your neck becomes stiff.  You drool or are unable to swallow liquids.  You vomit or are unable to keep medicines or liquids down.  You have severe pain that does not go away with the use of recommended medicines.  You have trouble breathing (not caused by a stuffy nose). MAKE SURE YOU:   Understand these instructions.  Will watch your condition.  Will get help right away if you are not doing well or get worse. Document Released: 10/31/2005 Document Revised: 08/21/2013 Document Reviewed: 07/08/2013 [facility] Patient Information 2015 [place], [place]. This information is not intended to replace advice given to you by your health care provider. Make sure you discuss any questions you have with your health care provider.

## 2014-12-18 NOTE — Progress Notes (Signed)
Subjective: Patient was here yesterday and had a high white count but negative flu test. It was thought that she had a viral illness. She has developed worsening sore throat and now has red tonsils and exudate so she came back in. She has the label of MS now, and is scheduled to start a medication for that soon.  Objective: TMs are normal. Nose does not seem very congested but she says it has been some. Her throat is erythematous with clumps of white exudate on the tonsils. Strep test was taken as well as a cold. Her neck is tender with left submandibular nodes greater than the right. Her chest is clear to auscultation. Heart regular.  Assessment: Pharyngitis, clinically appears to be strep  Plan: Strep test Plan to treat even if this is negative until the culture comes back if we have to send 1  Results for orders placed or performed in visit on 12/18/14  POCT rapid strep A  Result Value Ref Range   Rapid Strep A Screen Negative Negative   Clinical appearance is that of strep with a high white count. We will treat her.

## 2014-12-20 LAB — CULTURE, GROUP A STREP: Organism ID, Bacteria: NORMAL

## 2014-12-22 ENCOUNTER — Telehealth: Payer: Self-pay | Admitting: Diagnostic Neuroimaging

## 2014-12-22 NOTE — Telephone Encounter (Signed)
Patient is calling about IV medication Tysabri.  [facility] and insurance company is waiting for order to start medication.  Please call.

## 2014-12-24 ENCOUNTER — Encounter: Payer: Self-pay | Admitting: *Deleted

## 2015-01-02 ENCOUNTER — Other Ambulatory Visit: Payer: Self-pay | Admitting: *Deleted

## 2015-01-02 ENCOUNTER — Telehealth: Payer: Self-pay | Admitting: *Deleted

## 2015-01-02 DIAGNOSIS — G35 Multiple sclerosis: Secondary | ICD-10-CM

## 2015-01-02 NOTE — Telephone Encounter (Signed)
Called and spoke with pts daughter who gave me the pts cell phone number. Called to inform her that I got her first tysabri infusion schedule at [facility] for March 14th at 12pm. Asked her to call me back.

## 2015-01-06 NOTE — Telephone Encounter (Signed)
Patient returning Samantha's, RN call.  Gave patient appointment time and date for Tysabri Infusion.

## 2015-01-21 ENCOUNTER — Telehealth: Payer: Self-pay

## 2015-01-21 NOTE — Telephone Encounter (Signed)
[employer] is requesting updated paperwork be filled out for Patient. Will put form in Dr. Perfecto Kingdom box for completion.

## 2015-01-26 ENCOUNTER — Encounter (HOSPITAL_COMMUNITY): Payer: Self-pay

## 2015-01-26 ENCOUNTER — Encounter (HOSPITAL_COMMUNITY)
Admission: RE | Admit: 2015-01-26 | Discharge: 2015-01-26 | Disposition: A | Discharge status: Home / Self Care | Payer: 59 | Source: Ambulatory Visit | Attending: Diagnostic Neuroimaging | Admitting: Diagnostic Neuroimaging

## 2015-01-26 VITALS — BP 125/93 | HR 84 | Temp 98.2°F | Resp 18

## 2015-01-26 DIAGNOSIS — G35 Multiple sclerosis: Secondary | ICD-10-CM | POA: Insufficient documentation

## 2015-01-26 MED ORDER — SODIUM CHLORIDE 0.9 % IV SOLN
Freq: Once | INTRAVENOUS | Status: AC
  Administered 2015-01-26: 12:00:00 via INTRAVENOUS

## 2015-01-26 MED ORDER — LORATADINE 10 MG PO TABS
10.0000 mg | ORAL_TABLET | Freq: Every day | ORAL | Status: DC
  Administered 2015-01-26: 10 mg via ORAL
  Filled 2015-01-26: qty 1

## 2015-01-26 MED ORDER — ACETAMINOPHEN 500 MG PO TABS
1000.0000 mg | ORAL_TABLET | Freq: Once | ORAL | Status: AC
  Administered 2015-01-26: 1000 mg via ORAL
  Filled 2015-01-26: qty 2

## 2015-01-26 MED ORDER — SODIUM CHLORIDE 0.9 % IV SOLN
300.0000 mg | INTRAVENOUS | Status: DC
  Administered 2015-01-26: 300 mg via INTRAVENOUS
  Filled 2015-01-26: qty 15

## 2015-01-26 NOTE — Progress Notes (Signed)
First infusion of Tysabri completed and pt tolerated well.  Pt stayed for the 1 hour post infusion observation.  Pt d/c home ambulatory.

## 2015-01-26 NOTE — Discharge Instructions (Signed)

## 2015-02-16 ENCOUNTER — Encounter: Payer: Self-pay | Admitting: Family Medicine

## 2015-02-16 ENCOUNTER — Ambulatory Visit (INDEPENDENT_AMBULATORY_CARE_PROVIDER_SITE_OTHER): Payer: 59 | Admitting: Family Medicine

## 2015-02-16 ENCOUNTER — Telehealth: Payer: Self-pay | Admitting: Emergency Medicine

## 2015-02-16 VITALS — BP 130/86 | HR 104 | Temp 99.1°F | Resp 16 | Ht 63.0 in | Wt 296.8 lb

## 2015-02-16 DIAGNOSIS — R41840 Attention and concentration deficit: Secondary | ICD-10-CM

## 2015-02-16 DIAGNOSIS — R32 Unspecified urinary incontinence: Secondary | ICD-10-CM | POA: Diagnosis not present

## 2015-02-16 LAB — POCT URINALYSIS DIPSTICK
Bilirubin, UA: NEGATIVE
Glucose, UA: NEGATIVE
Ketones, UA: NEGATIVE
LEUKOCYTES UA: NEGATIVE
Nitrite, UA: NEGATIVE
PROTEIN UA: NEGATIVE
Spec Grav, UA: 1.02
Urobilinogen, UA: 0.2
pH, UA: 5

## 2015-02-16 LAB — POCT UA - MICROSCOPIC ONLY
CASTS, UR, LPF, POC: NEGATIVE
CRYSTALS, UR, HPF, POC: NEGATIVE
Mucus, UA: NEGATIVE
YEAST UA: NEGATIVE

## 2015-02-16 NOTE — Telephone Encounter (Signed)
[employer] wants to know the date that the employee can return to work with no restrictions? Currently she is working 4 hours sitting and 4 hours standing totaling to a full 8 hour day. [employer] also wants to know if the 4 hours sitting and 4 hours standing is extended beyond 03/10/2015. I put the form in your box. Just sign and let me know about the other stuff.   Thanks, [employer]
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
## 2015-02-16 NOTE — Patient Instructions (Signed)
Kegel Exercises The goal of Kegel exercises is to isolate and exercise your pelvic floor muscles. These muscles act as a hammock that supports the rectum, vagina, small intestine, and uterus. As the muscles weaken, the hammock sags and these organs are displaced from their normal positions. Kegel exercises can strengthen your pelvic floor muscles and help you to improve bladder and bowel control, improve sexual response, and help reduce many problems and some discomfort during pregnancy. Kegel exercises can be done anywhere and at any time. HOW TO PERFORM KEGEL EXERCISES 1. Locate your pelvic floor muscles. To do this, squeeze (contract) the muscles that you use when you try to stop the flow of urine. You will feel a tightness in the vaginal area (women) and a tight lift in the rectal area (men and women). 2. When you begin, contract your pelvic muscles tight for 2-5 seconds, then relax them for 2-5 seconds. This is one set. Do 4-5 sets with a short pause in between. 3. Contract your pelvic muscles for 8-10 seconds, then relax them for 8-10 seconds. Do 4-5 sets. If you cannot contract your pelvic muscles for 8-10 seconds, try 5-7 seconds and work your way up to 8-10 seconds. Your goal is 4-5 sets of 10 contractions each day. Keep your stomach, buttocks, and legs relaxed during the exercises. Perform sets of both short and long contractions. Vary your positions. Perform these contractions 3-4 times per day. Perform sets while you are:   Lying in bed in the morning.  Standing at lunch.  Sitting in the late afternoon.  Lying in bed at night. You should do 40-50 contractions per day. Do not perform more Kegel exercises per day than recommended. Overexercising can cause muscle fatigue. Continue these exercises for for at least 15-20 weeks or as directed by your caregiver. Document Released: 10/17/2012 Document Reviewed: 10/17/2012 Exit[employer] Patient Information 2015 [employer]. This information is  not intended to replace advice given to you by your health care provider. Make sure you discuss any questions you have with your health care provider.  

## 2015-02-17 NOTE — Telephone Encounter (Signed)
Form has been taken care of and faxed.

## 2015-02-18 ENCOUNTER — Telehealth: Payer: Self-pay | Admitting: Diagnostic Neuroimaging

## 2015-02-18 NOTE — Telephone Encounter (Signed)
Pt is calling stating that one of her providers at her job wanted to know if she could start Adderall. It should not interfere with her Tysabri. Please call and advise.

## 2015-02-20 NOTE — Telephone Encounter (Signed)
Spoke to the pt on the phone and asked her to clarify what it was that she needed the adderall for, MS or ADHD. She stated that she needed it for ADHD and then explained to me that her provider at her job would write the prescription, she just wanted Dr. Leta Baptist to state that it would be ok for her to take adderall with tysabri. Dr. Leta Baptist told me 02/19/15 that it would not hurt anything for her to take adderall with tysabri. Pt stated a thank you

## 2015-02-23 ENCOUNTER — Encounter (HOSPITAL_COMMUNITY): Payer: 59

## 2015-03-03 ENCOUNTER — Encounter: Payer: Self-pay | Admitting: Diagnostic Neuroimaging

## 2015-03-03 ENCOUNTER — Ambulatory Visit (INDEPENDENT_AMBULATORY_CARE_PROVIDER_SITE_OTHER): Payer: 59 | Admitting: Diagnostic Neuroimaging

## 2015-03-03 ENCOUNTER — Ambulatory Visit: Payer: 59 | Admitting: Diagnostic Neuroimaging

## 2015-03-03 VITALS — BP 139/97 | HR 109 | Ht 63.5 in | Wt 297.4 lb

## 2015-03-03 DIAGNOSIS — G35 Multiple sclerosis: Secondary | ICD-10-CM

## 2015-03-03 DIAGNOSIS — G35D Multiple sclerosis, unspecified: Secondary | ICD-10-CM

## 2015-03-03 NOTE — Patient Instructions (Signed)
Continue tysabri. 

## 2015-03-03 NOTE — Progress Notes (Addendum)
[facility]  PATIENT: [name] [name] [name] [name] [name] DOB: 1971-11-02  REFERRING CLINICIAN:  HISTORY FROM: patient  REASON FOR VISIT: follow up   HISTORICAL  CHIEF COMPLAINT:  Chief Complaint  Patient presents with  . Follow-up    Multiple Sclerosis     HISTORY OF PRESENT ILLNESS:   UPDATE 03/03/15: Since last visit, more right hip and low back pain. Intermittent. Had tysabri on March 14 and Apr 11. Overall c/o fatigue, focusing issues. 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  UPDATE 12/01/14: Since last visit, left side weakness has improved. Now having more problems with right knee pain, esp walking longer distances. Awaiting tysabri scheduling.   PRIOR HPI (10/29/14): 44 year old right-handed female here for evaluation of multiple sclerosis. 10/15/14 patient developed staggering to the left side. The next day she noted left arm and left leg heaviness and weakness. Patient was in the hospital and had evaluation. MRI of the brain demonstrated multiple brain lesions suspicious for demyelinating disease. Patient was treated with IV steroids and her left sided symptoms have almost completely resolved. Patient continues to have a slight limp with left leg difficulty. Also has some slight incoordination with left hand. In the past patient has had intermittent headaches. Patient has also had intermittent episodes of knee pain and intermittent vertigo attacks. Patient has family history of multiple sclerosis in maternal uncle and maternal cousin.    REVIEW OF SYSTEMS: Full 14 system review of systems performed and notable only for fatigue shortness of breath blurred vision joint pain back pain cramps walking diff dizziness headache numbness snoring apnea daytime sleepiness.   ALLERGIES: Allergies  Allergen Reactions  . Iron Shortness Of Breath    IV only   . Delsym [Dextromethorphan Polistirex Er] Other (See Comments)    nightmares  . Hydrocodone-Guaifenesin Other (See Comments)     nightmares  . Doxycycline Other (See Comments)    Bad taste to patient    HOME MEDICATIONS: Outpatient Prescriptions Prior to Visit  Medication Sig Dispense Refill  . ergocalciferol (DRISDOL) 50000 UNITS capsule Take 1 capsule (50,000 Units total) by mouth once a week. 4 capsule 5  . Natalizumab (TYSABRI IV) Inject 300 mg into the vein every 30 (thirty) days.    Marland Kitchen telmisartan-hydrochlorothiazide (MICARDIS HCT) 80-12.5 MG per tablet 1 tab po qd 90 tablet 3  . aspirin EC 81 MG tablet Take 1 tablet (81 mg total) by mouth daily. 30 tablet 0  . zolpidem (AMBIEN) 5 MG tablet Take 1 tablet (5 mg total) by mouth at bedtime as needed for sleep. 15 tablet 0   No facility-administered medications prior to visit.    PAST MEDICAL HISTORY: Past Medical History  Diagnosis Date  . Hypertension   . Smoker   . Allergy   . Anemia   . Depression   . Anxiety   . Sickle cell trait   . Iron overload, transfusional   . Sickle cell anemia     trait  . Clotting disorder     from beeing anemic  . Migraine   . Tachycardia     PAST SURGICAL HISTORY: Past Surgical History  Procedure Laterality Date  . Cervical ablation    . Tubal ligation    . Cholecystectomy    . Cesarean section      3 times    FAMILY HISTORY: Family History  Problem Relation Age of Onset  . Mental illness Mother   . Hyperlipidemia Mother   . Hyperthyroidism Mother   .  Mental retardation Mother   . ADD / ADHD Son   . Diabetes Father   . Cancer Father   . Cancer Paternal Aunt     breast  . Arthritis Maternal Grandmother   . Diabetes Maternal Grandmother   . Hearing loss Maternal Grandmother     left hear when she was a child  . Hyperlipidemia Maternal Grandmother   . Hypertension Maternal Grandmother   . Alcohol abuse Maternal Grandfather   . Early death Maternal Grandfather   . Cancer Paternal Aunt     breast  . Multiple sclerosis Cousin     SOCIAL HISTORY:  History   Social History  . Marital  Status: Single    Spouse Name: N/A  . Number of Children: 3  . 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 Smoking status: Current Every Day Smoker -- 0.25 packs/day for 25 years    Types: Cigarettes    Last Attempt to Quit: 10/16/2014  . Smokeless tobacco: Never Used     Comment: smoke 5 cigarettes/day or prn  . Alcohol Use: 0.0 oz/week    0 Standard drinks or equivalent per week     Comment: once maybe a month if that  . Drug Use: No  . Sexual Activity:    Partners: Male    Birth Control/ Protection: Condom   Other Topics Concern  . Not on file   Social History Narrative   Patient lives at home with her family.   Caffeine Use: 1 cup daily     PHYSICAL EXAM  Filed Vitals:   03/03/15 1306  BP: 139/97  Pulse: 109  Height: 5' 3.5" (1.613 [name])  Weight: 297 lb 6.4 oz (134.9 kg)    Body mass index is 51.85 kg/([name]^2).   Visual Acuity Screening   Right eye Left eye Both eyes  Without correction: 20/30 20/30   With correction:       No flowsheet data found.  GENERAL EXAM: Patient is in no distress; well developed, nourished and groomed; neck is supple  CARDIOVASCULAR: Regular rate and rhythm, no murmurs, no carotid bruits  NEUROLOGIC: MENTAL STATUS: awake, alert, language fluent, comprehension intact, naming intact, fund of knowledge appropriate CRANIAL NERVE: pupils equal and reactive to light, visual fields full to confrontation, extraocular muscles intact, no nystagmus, facial sensation and strength symmetric, hearing intact, palate elevates symmetrically, uvula midline, shoulder shrug symmetric, tongue midline. MOTOR: normal bulk and tone, full strength in the BUE, BLE SENSORY: normal and symmetric to light touch COORDINATION: finger-nose-finger, fine finger movements SLOW ON LEFT SIDE; NORMAL ON RIGHT.  REFLEXES: deep tendon reflexes present and symmetric GAIT/STATION: narrow based gait; SLOW CAREFUL GAIT;  SLIGHTLY LIMP WITH LEFT LEG     DIAGNOSTIC DATA (LABS, IMAGING, TESTING) - I reviewed patient records, labs, notes, testing and imaging myself where available.  Lab Results  Component Value Date   WBC 15.8* 12/17/2014   HGB 11.8* 12/17/2014   HCT 37.8 12/17/2014   MCV 75.3* 12/17/2014   PLT 229 10/22/2014      Component Value Date/Time   NA 141 10/20/2014 0625   K 4.0 10/20/2014 0625   CL 101 10/20/2014 0625   CO2 25 10/20/2014 0625   GLUCOSE 146* 10/20/2014 0625   BUN 23 10/20/2014 0625   CREATININE 0.81 10/20/2014 0625   CREATININE 0.61 08/21/2014 1440   CALCIUM 10.6* 10/20/2014 0625   PROT  7.7 10/16/2014 1557   ALBUMIN 3.9 10/16/2014 1557   AST 17 10/16/2014 1557   ALT 24 10/16/2014 1557   ALKPHOS 87 10/16/2014 1557   BILITOT 0.2* 10/16/2014 1557   GFRNONAA 86* 10/20/2014 0625   GFRAA >90 10/20/2014 0625   Lab Results  Component Value Date   CHOL 170 10/17/2014   HDL 36* 10/17/2014   LDLCALC 97 10/17/2014   TRIG 185* 10/17/2014   CHOLHDL 4.7 10/17/2014   Lab Results  Component Value Date   HGBA1C 6.1* 10/17/2014   No results found for: [id number] Lab Results  Component Value Date   TSH 0.904 12/12/2013   VIT D, 25-HYDROXY  Date Value Ref Range Status  08/21/2014 12* 30 - 89 ng/mL Final    Comment:    This assay accurately quantifies Vitamin D, which is the sum of the 25-Hydroxy forms of Vitamin D2 and D3.  Studies have shown that the optimum concentration of 25-Hydroxy Vitamin D is 30 ng/mL or higher.  Concentrations of Vitamin D between 20 and 29 ng/mL are considered to be insufficient and concentrations less than 20 ng/mL are considered to be deficient for Vitamin D.   I reviewed images myself and agree with interpretation. -VRP  10/17/14 MRI brain (without)  1. Multiple T2/FLAIR hyperintense lesions with associated restricted diffusion involving the white matter of both cerebral hemispheres, right greater than left, most consistent with active  demyelinating disease. 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 Follow-up examination with postcontrast imaging could be performed for complete evaluation. 2. No other findings to suggest acute intracranial infarct or other process.  10/17/14 MRI cervical spine (with and without)  - Motion degraded exam demonstrating no convincing evidence for spinal MS.  Certainly there are no areas of T2 hyperintensity or cord enlargement. No disc protrusion or spinal stenosis.  10/18/14 MRI brain (with) - Findings consistent with acute demyelination in the RIGHT posterior frontal subcortical white matter in this patient with suspected multiple sclerosis. Multiple other areas of T2 signal hyperintensity on the previous study do not display similar postcontrast enhancement. 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   10/21/14 CSF opening pressure 19cm H2O - (WBC 18, RBC 3545, protein 50, glucose 110, cryptococcal ag negative, IgG index 7.1 (normal), OCB > 5 well defined gamma restriction bands)  10/29/14 JCV ab - negative     ASSESSMENT AND PLAN  44 y.o. 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 Now on tysabri.    PLAN: - continue tysabri - continue physical activity, PT exercises - tylenol prn back / right hip pain - repeat MRI and JCV ab in Sept 2016  Return in about 3 months (around 06/02/2015).  I spent 25 minutes of face to face time with patient. Greater than 50% of time was spent in counseling and coordination of care with patient.       Penni Bombard, MD 3/88/8280, 0:34 PM Certified in Neurology, Neurophysiology and Neuroimaging  [facility] [address], [place],  [address] [phone]

## 2015-03-03 NOTE — Addendum Note (Signed)
Addended byAndrey Spearman on: 03/03/2015 01:58 PM   Modules accepted: Level of Service

## 2015-03-05 ENCOUNTER — Ambulatory Visit (INDEPENDENT_AMBULATORY_CARE_PROVIDER_SITE_OTHER): Payer: 59 | Admitting: Emergency Medicine

## 2015-03-05 ENCOUNTER — Encounter: Payer: Self-pay | Admitting: Emergency Medicine

## 2015-03-05 VITALS — BP 108/72 | HR 102 | Temp 98.8°F | Resp 16 | Ht 63.5 in | Wt 296.0 lb

## 2015-03-05 DIAGNOSIS — R269 Unspecified abnormalities of gait and mobility: Secondary | ICD-10-CM

## 2015-03-05 DIAGNOSIS — R3989 Other symptoms and signs involving the genitourinary system: Secondary | ICD-10-CM | POA: Diagnosis not present

## 2015-03-05 DIAGNOSIS — R739 Hyperglycemia, unspecified: Secondary | ICD-10-CM | POA: Diagnosis not present

## 2015-03-05 DIAGNOSIS — G4733 Obstructive sleep apnea (adult) (pediatric): Secondary | ICD-10-CM

## 2015-03-05 DIAGNOSIS — R2689 Other abnormalities of gait and mobility: Secondary | ICD-10-CM

## 2015-03-05 DIAGNOSIS — D573 Sickle-cell trait: Secondary | ICD-10-CM | POA: Diagnosis not present

## 2015-03-05 DIAGNOSIS — R41840 Attention and concentration deficit: Secondary | ICD-10-CM

## 2015-03-05 DIAGNOSIS — E559 Vitamin D deficiency, unspecified: Secondary | ICD-10-CM | POA: Diagnosis not present

## 2015-03-05 DIAGNOSIS — Z9989 Dependence on other enabling machines and devices: Secondary | ICD-10-CM

## 2015-03-05 LAB — POCT URINALYSIS DIPSTICK
Bilirubin, UA: NEGATIVE
Blood, UA: NEGATIVE
Glucose, UA: NEGATIVE
Ketones, UA: NEGATIVE
Leukocytes, UA: NEGATIVE
Nitrite, UA: NEGATIVE
PH UA: 5.5
Protein, UA: NEGATIVE
SPEC GRAV UA: 1.02
UROBILINOGEN UA: 0.2

## 2015-03-05 LAB — CBC WITH DIFFERENTIAL/PLATELET
BASOS PCT: 1 % (ref 0–1)
Basophils Absolute: 0.1 10*3/uL (ref 0.0–0.1)
EOS ABS: 0.3 10*3/uL (ref 0.0–0.7)
EOS PCT: 3 % (ref 0–5)
HCT: 36.4 % (ref 36.0–46.0)
Hemoglobin: 11.4 g/dL — ABNORMAL LOW (ref 12.0–15.0)
Lymphocytes Relative: 35 % (ref 12–46)
Lymphs Abs: 3.7 10*3/uL (ref 0.7–4.0)
MCH: 22.8 pg — ABNORMAL LOW (ref 26.0–34.0)
MCHC: 31.3 g/dL (ref 30.0–36.0)
MCV: 72.8 fL — AB (ref 78.0–100.0)
MONO ABS: 0.7 10*3/uL (ref 0.1–1.0)
MONOS PCT: 7 % (ref 3–12)
NEUTROS PCT: 54 % (ref 43–77)
Neutro Abs: 5.7 10*3/uL (ref 1.7–7.7)
Platelets: 298 10*3/uL (ref 150–400)
RBC: 5 MIL/uL (ref 3.87–5.11)
RDW: 17.6 % — AB (ref 11.5–15.5)
WBC: 10.6 10*3/uL — ABNORMAL HIGH (ref 4.0–10.5)

## 2015-03-05 LAB — VITAMIN D 25 HYDROXY (VIT D DEFICIENCY, FRACTURES): Vit D, 25-Hydroxy: 20 ng/mL — ABNORMAL LOW (ref 30–100)

## 2015-03-05 LAB — POCT GLYCOSYLATED HEMOGLOBIN (HGB A1C): Hemoglobin A1C: 6

## 2015-03-05 LAB — GLUCOSE, POCT (MANUAL RESULT ENTRY): POC GLUCOSE: 94 mg/dL (ref 70–99)

## 2015-03-05 MED ORDER — ATOMOXETINE HCL 60 MG PO CAPS: 60.0000 mg | ORAL_CAPSULE | Freq: Every day | ORAL | Status: DC

## 2015-03-05 NOTE — Patient Instructions (Signed)

## 2015-03-05 NOTE — Progress Notes (Signed)
Subjective:    Patient ID: [name], female    DOB: Jan 29, 1971, 44 y.o.   MRN: [id number] This chart was scribed for Arlyss Queen, MD by [name], Medical Scribe. This patient was seen in room 21 and the patient's care was started at 11:34 AM.   HPI HPI Comments: [name] is a 44 y.o. female with a hx of MS and ADHD who [facility] for a follow-up for MS. Patient states she has been doing great this past week. She still has dizziness every day, but she states she only has this when she moves around a lot. She also reports fatigue with ambulating and burning pain to her back, hips, and legs. Patient has been taking Tylenol for pain. She has some difficulty ambulating for longer periods of time. Patient has had 2 treatments so far, with her last treatment 10 days ago. She notes she had her menstrual periods when she had her last treatment. She has been sleeping at 4:00 PM until 7:00 PM, then sleeping again around 11:30 PM for the rest of the night. Patient notes having some increased irritability recently. She does smoke.  Patient notes that her urine has been darker.  Patient has a lot of stress due to work and family. She works as a Technical brewer here.  Review of Systems  Constitutional: Positive for fatigue.  Musculoskeletal: Positive for back pain.  Neurological: Positive for dizziness.  Psychiatric/Behavioral: Positive for agitation.       Objective:   Physical Exam CONSTITUTIONAL: Well developed/well nourished HEAD: Normocephalic/atraumatic EYES: EOM/PERRL ENMT: Mucous membranes moist NECK: supple no meningeal signs SPINE: entire spine nontender CV: S1/S2 noted, no murmurs/rubs/gallops noted LUNGS: Lungs are clear to auscultation bilaterally, no apparent distress ABDOMEN: soft, nontender, no rebound or guarding GU: no cva tenderness NEURO: Cranial nerves intact. No weakness. Minimal gait abnormality. Romberg not ataxic. EXTREMITIES:  pulses normal, full ROM SKIN: warm, color normal PSYCH: no abnormalities of mood noted Results for orders placed or performed in visit on 03/05/15  Vitamin D, 25-hydroxy  Result Value Ref Range   Vit D, 25-Hydroxy 20 (L) 30 - 100 ng/mL  CBC with Differential/Platelet  Result Value Ref Range   WBC  4.0 - 10.5 K/uL   RBC  3.87 - 5.11 MIL/uL   Hemoglobin  12.0 - 15.0 g/dL   HCT  36.0 - 46.0 %   MCV  78.0 - 100.0 fL   MCH  26.0 - 34.0 pg   MCHC  30.0 - 36.0 g/dL   RDW  11.5 - 15.5 %   Platelets  150 - 400 K/uL   MPV  8.6 - 12.4 fL   Neutrophils Relative %  43 - 77 %   Neutro Abs  1.7 - 7.7 K/uL   Lymphocytes Relative  12 - 46 %   Lymphs Abs  0.7 - 4.0 K/uL   Monocytes Relative  3 - 12 %   Monocytes Absolute  0.1 - 1.0 K/uL   Eosinophils Relative  0 - 5 %   Eosinophils Absolute  0.0 - 0.7 K/uL   Basophils Relative  0 - 1 %   Basophils Absolute  0.0 - 0.1 K/uL   Smear Review    POCT urinalysis dipstick  Result Value Ref Range   Color, UA yellow    Clarity, UA clear    Glucose, UA neg    Bilirubin, UA neg    Ketones, UA neg  Spec Grav, UA 1.020    Blood, UA neg    pH, UA 5.5    Protein, UA neg    Urobilinogen, UA 0.2    Nitrite, UA neg    Leukocytes, UA Negative   POCT glucose (manual entry)  Result Value Ref Range   POC Glucose 94 70 - 99 mg/dl  POCT glycosylated hemoglobin (Hb A1C)  Result Value Ref Range   Hemoglobin A1C 6.0          Assessment & Plan:   Patient looks good. Labs were done today. 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 She still has incredible fatigue at the end of her workday requiring 2-3 hours of rest. She is getting more sleep and using her CPAP.I personally performed the services described in this documentation, which was scribed in my presence. The recorded information has been reviewed and is accurate.

## 2015-03-17 ENCOUNTER — Other Ambulatory Visit: Payer: Self-pay

## 2015-03-17 VITALS — BP 130/86 | HR 84 | Ht 63.0 in | Wt 295.8 lb

## 2015-03-17 DIAGNOSIS — I10 Essential (primary) hypertension: Secondary | ICD-10-CM

## 2015-03-17 NOTE — Patient Instructions (Signed)

## 2015-03-17 NOTE — Patient Outreach (Signed)
[name]) Care Management   03/17/2015  [name] 12-30-1970 [id number]  [name] is an 44 y.o. female.   Member seen for follow up office visit for Link to Wellness program for self management of Hypertension.  Subjective: Member states that she is feeling stronger and walking better since her last injection of Tysabri.  States that she did not pick up the blood pressure monitor from the pharmacy.  States that she does check her blood pressure at her office sometimes. States that her pressures have been improved.  States that her work is allowing her to do some desk work so she is not on her feet all day.  States she has fatigue at the end of the day.  States she has been doing stretching exercises that therapy taught her which is helping with her leg strength and pain in her legs.  States she is following a low sodium diet but she has not been able to lose any weight.    Objective:   Review of Systems  All other systems reviewed and are negative.   Physical Exam  Filed Vitals:   03/17/15 0851  BP: 130/86  Pulse: 84   Filed Weights   03/17/15 0851  Weight: 295 lb 12.8 oz (134.174 kg)    Current Medications:   Current Outpatient Prescriptions  Medication Sig Dispense Refill  . ergocalciferol (DRISDOL) 50000 UNITS capsule Take 1 capsule (50,000 Units total) by mouth once a week. 4 capsule 5  . Natalizumab (TYSABRI IV) Inject 300 mg into the vein every 30 (thirty) days.    Marland Kitchen telmisartan-hydrochlorothiazide (MICARDIS HCT) 80-12.5 MG per tablet 1 tab po qd 90 tablet 3  . atomoxetine (STRATTERA) 60 MG capsule Take 1 capsule (60 mg total) by mouth daily. (Patient not taking: Reported on 03/17/2015) 90 capsule 1   No current facility-administered medications for this visit.    Functional Status:   In your present state of health, do you have any difficulty performing the following activities: 03/17/2015 01/26/2015  Hearing? N N  Vision? N N  Difficulty  concentrating or making decisions? N N  Walking or climbing stairs? N N  Dressing or bathing? N N  Doing errands, shopping? N -    Fall/Depression Screening:    PHQ 2/9 Scores 03/17/2015 03/05/2015 02/16/2015 10/27/2014  PHQ - 2 Score 1 0 0 1   [facility] CM Care Plan        Most Recent Value   Problem One    Care Plan Problem One  Potential for elevated blood presures related to dx of hypertension   Role Documenting the Problem One  Care Management Coordinator   [facility] CM Care Plan Problem One     Care Plan for Problem One  Active   Patient Has Long Term Goal?  Yes   THN Long Term Goal (31-90 days)  Member will monitor B/P 5 times a week with goal of less than 140/90 80% of readings as evidenced by log readings for the next 90 days   THN Long Term Goal Start Date  03/17/15   Interventions for Problem One Long Term Goal  Instructed to pick up blood pressure monitor at pharmacy, Reinforced to write readings on log and bring to next visit,  Reinforced  to follow a low sodium diet, Discussed importance of weight loss to help with her HTN and  MS,  Given phone applications that are free that can help with weight loss, Reviewed  medications  and   encouraged to discuss with pharmacy if there any medicaitons that are on the free list that  MD could change her  if desired   Problem One Short Term Goals    Short Term Goal #1    Short Term Goal #2    Short Term Goal #3    Short Term Goal #4    Short Tern Goal #5    Problem Two    [facility] CM Care Plan Problem Two    Problem Two Short Term Goals    Short Term Goal #1    Short Term Goal #2    Short Term Goal #3    Short Term Goal #4    Short Term Goal #5    Problem Three    [facility] CM Care Plan Problem Three    Problem Three Short Term Goals    Short Term Goal #1    Short Term Goal #2    Short Term Goal #3    Short Term Goal #4    Short Term Goal #5        Assessment:  Member seen for Link to Wellness program for hypertension, Member has not been checking  her blood pressures at home and she did not pick up blood pressure monitor, Member has been checking B/P at work, She reports she is following a low sodium diet but has not been trying to lose weight, Reports that she has been walking for 20 minutes and then rest for 3 minutes 2-3 times a week  Plan:  Plan to check blood pressure 5 times a week and keep a log.            Plan to pick up blood pressure cuff at outpatient pharmacy.            Plan to try using My Fitness Pal to help lose weight            Plan to return to Link to [facility]   June 15, 2015 8:30AM  Peter Garter RN, [facility] Care Management Coordinator-Link to [place] [facility] [phone]

## 2015-03-23 ENCOUNTER — Encounter (HOSPITAL_COMMUNITY): Payer: 59

## 2015-03-23 IMAGING — CR DG CHEST 2V
2 series · 2 of 2 positions shown · non-contrast
Comparison: 12/18/2012

CLINICAL DATA: Fatigue, dizziness, chills, fever and congestion.

EXAM:
CHEST  2 VIEW

[PA]
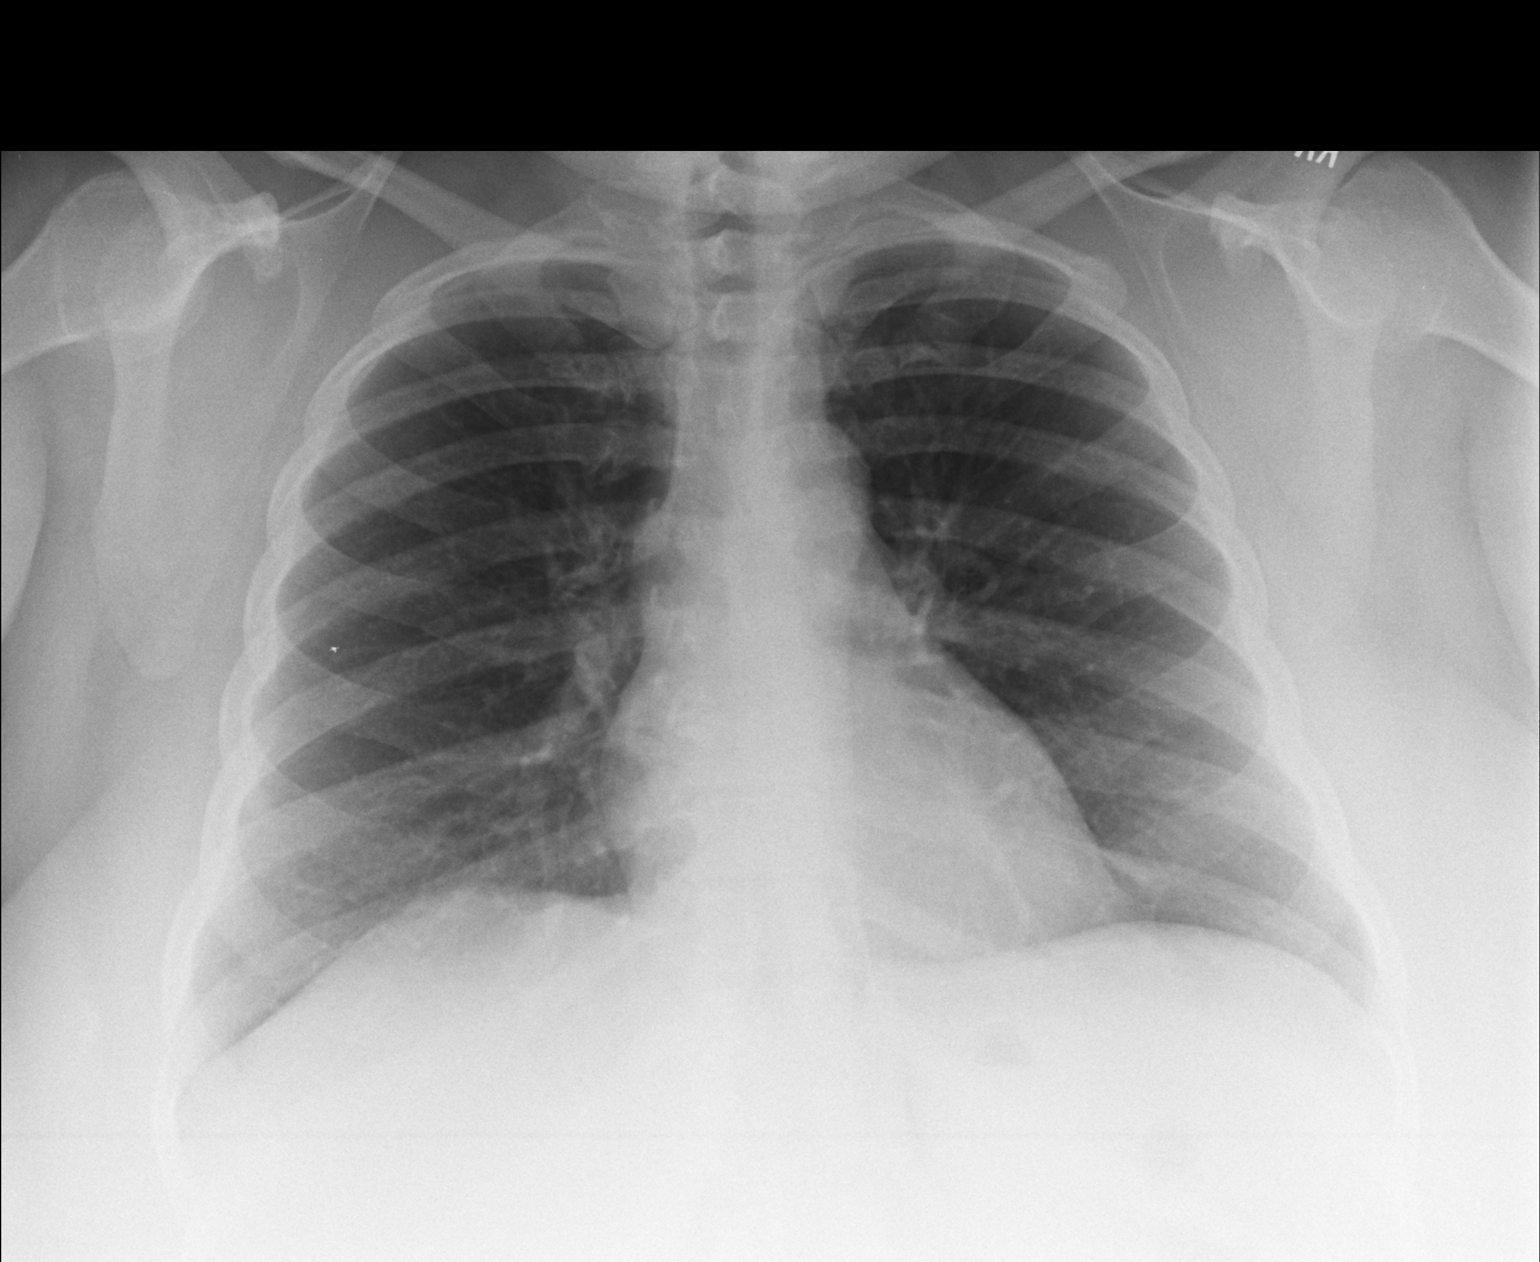

[lateral]
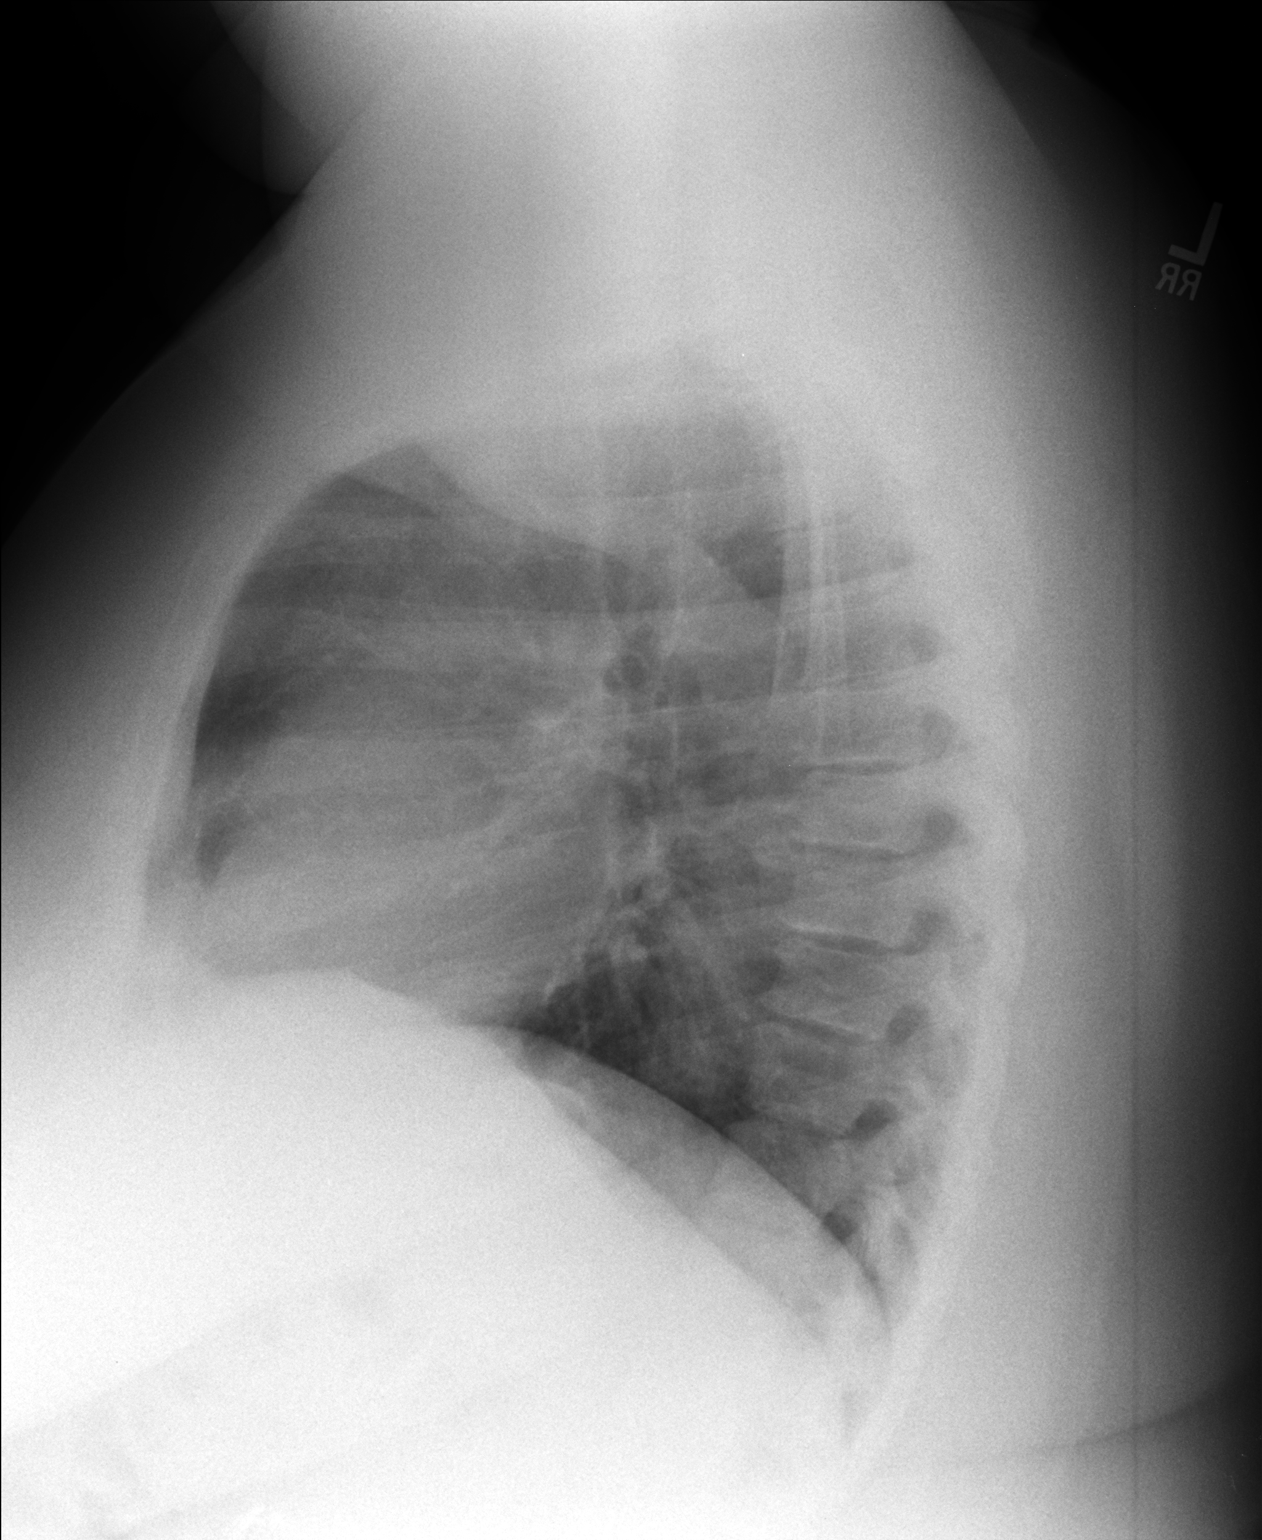

[2 of 2 positions shown; findings below may reference images not displayed]

FINDINGS: The heart size and mediastinal contours are within normal limits.
Both lungs are clear. The visualized skeletal structures are
unremarkable.
IMPRESSION: No active cardiopulmonary disease.

## 2015-04-20 ENCOUNTER — Encounter (HOSPITAL_COMMUNITY): Payer: 59

## 2015-04-21 ENCOUNTER — Telehealth: Payer: Self-pay | Admitting: *Deleted

## 2015-04-21 ENCOUNTER — Other Ambulatory Visit: Payer: Self-pay | Admitting: Emergency Medicine

## 2015-04-21 MED ORDER — ERGOCALCIFEROL 1.25 MG (50000 UT) PO CAPS: 50000.0000 [IU] | ORAL_CAPSULE | ORAL | Status: AC

## 2015-04-21 NOTE — Telephone Encounter (Signed)
Pt would like refills on her vitamin D

## 2015-04-21 NOTE — Telephone Encounter (Signed)
Prescription sent

## 2015-05-07 ENCOUNTER — Encounter: Payer: Self-pay | Admitting: *Deleted

## 2015-05-19 ENCOUNTER — Encounter (HOSPITAL_COMMUNITY): Payer: 59

## 2015-05-21 ENCOUNTER — Ambulatory Visit (INDEPENDENT_AMBULATORY_CARE_PROVIDER_SITE_OTHER): Payer: 59 | Admitting: Family Medicine

## 2015-05-21 VITALS — BP 125/83 | HR 83 | Temp 98.2°F | Resp 16 | Ht 63.0 in | Wt 297.0 lb

## 2015-05-21 DIAGNOSIS — G35 Multiple sclerosis: Secondary | ICD-10-CM | POA: Diagnosis not present

## 2015-05-21 DIAGNOSIS — M5440 Lumbago with sciatica, unspecified side: Secondary | ICD-10-CM

## 2015-05-21 DIAGNOSIS — M5430 Sciatica, unspecified side: Secondary | ICD-10-CM | POA: Diagnosis not present

## 2015-05-21 DIAGNOSIS — M545 Low back pain: Secondary | ICD-10-CM

## 2015-05-21 DIAGNOSIS — G43001 Migraine without aura, not intractable, with status migrainosus: Secondary | ICD-10-CM

## 2015-05-21 MED ORDER — TRAMADOL HCL 50 MG PO TABS: 50.0000 mg | ORAL_TABLET | Freq: Three times a day (TID) | ORAL | Status: DC | PRN

## 2015-05-21 MED ORDER — KETOROLAC TROMETHAMINE 60 MG/2ML IM SOLN
60.0000 mg | Freq: Once | INTRAMUSCULAR | Status: AC
  Administered 2015-05-21: 60 mg via INTRAMUSCULAR

## 2015-05-21 MED ORDER — METAXALONE 800 MG PO TABS: 800.0000 mg | ORAL_TABLET | Freq: Three times a day (TID) | ORAL | Status: DC

## 2015-05-21 NOTE — Patient Instructions (Signed)
You will receive a shot of 60 mg of ketorolac (Toradol) here in the office  Take Skelaxin (metaxalone) one half to one pill 3 times daily for muscle relaxant  Take tramadol 1 every 6 or 8 hours if needed for pain. Taking Tylenol along with tramadol helps make it a more potent pain reliever. Do not exceed 6 of the 500 mg Tylenol in 24 hours.  If you are worse at any time return or go to the emergency room  If the sciatica is not improving over the next few days then we may need to try a course of prednisone.

## 2015-05-21 NOTE — Progress Notes (Signed)
  Subjective:  Patient ID: [name], female    DOB: 1971/05/28  Age: 44 y.o. 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 She has a history of MS. Prior to diagnosis that she has a long history of headaches. She also has been having problems with pains in the back off and on. However those items, the headaches and back pain, had been doing better. She's been getting monthly infusions for her MS, and things seem to have stabilized. She is scheduled to see her neurologist in the next few weeks and get an MRI after that. 2 days ago she started having a bad left sided headache behind her eye, also up around over it. She took Tylenol and ibuprofen without relief. She developed low back pain yesterday and today in the lumbar region but primarily the pain was in the left leg sciatica type pain down to her left calf. This morning when she got up it was halfway down the right leg also. Dr. Everlene Farrier who is her primary care doctor asked me to go ahead and see her for him today.   Objective:   Pleasant lady well-known to me. Morbidly obese. Eyes PERRLA. Fundi benign. TMs normal. Range of motion of neck good. Chest clear. Heart regular without murmurs. Straight leg raising test positive at about 40 on the left, 80 on the right. Deep tendon reflexes trace, symmetrical. Sensory grossly intact. No calf tenderness. She has a little tenderness in her lumbar spine area, but the leg seems to be where she has most of the pain. She is alert and oriented.  Assessment & Plan:   Assessment: Migraine headache Lumbar sciatica with radiculopathy Multiple sclerosis  Plan: I think that this is probably not from the MS, but from her old migraines and the sciatica from her obesity and no back pain problems. Will treat symptomatically initially, but if symptoms worsen or are not improving then she will require further workup. She understands this. Patient Instructions  You will receive a  shot of 60 mg of ketorolac (Toradol) here in the office  Take Skelaxin (metaxalone) one half to one pill 3 times daily for muscle relaxant  Take tramadol 1 every 6 or 8 hours if needed for pain. Taking Tylenol along with tramadol helps make it a more potent pain reliever. Do not exceed 6 of the 500 mg Tylenol in 24 hours.  If you are worse at any time return or go to the emergency room  If the sciatica is not improving over the next few days then we may need to try a course of prednisone.    Karron Alvizo, MD 05/21/2015

## 2015-06-02 ENCOUNTER — Encounter: Payer: Self-pay | Admitting: Diagnostic Neuroimaging

## 2015-06-02 ENCOUNTER — Ambulatory Visit (INDEPENDENT_AMBULATORY_CARE_PROVIDER_SITE_OTHER): Payer: 59 | Admitting: Diagnostic Neuroimaging

## 2015-06-02 VITALS — BP 121/84 | HR 93 | Ht 63.0 in | Wt 294.8 lb

## 2015-06-02 DIAGNOSIS — G35 Multiple sclerosis: Secondary | ICD-10-CM | POA: Diagnosis not present

## 2015-06-02 DIAGNOSIS — G35D Multiple sclerosis, unspecified: Secondary | ICD-10-CM

## 2015-06-02 NOTE — Patient Instructions (Signed)
MRI brain and lab testing in September 2016.

## 2015-06-02 NOTE — Progress Notes (Signed)
[facility]  PATIENT: [name] DOB: 01-16-1971  REFERRING CLINICIAN:  HISTORY FROM: patient  REASON FOR VISIT: follow up   HISTORICAL  CHIEF COMPLAINT:  Chief Complaint  Patient presents with  . MS    rm 7  . Follow-up    HISTORY OF PRESENT ILLNESS:   UPDATE 06/02/15: Doing well. Strength good. No numbness. Fatigue better. Concentration is better. Intermittent word finding diff (scanning speech). Tolerating tysabri infusions. 3 weeks ago noted fine small bumps on arms, mild itching. Some int headaches in temporal regions, mild.   UPDATE 03/03/15: Since last visit, more right hip and low back pain. Intermittent. Had tysabri on March 14 and Apr 11. Overall c/o fatigue, focusing issues. 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  UPDATE 12/01/14: Since last visit, left side weakness has improved. Now having more problems with right knee pain, esp walking longer distances. Awaiting tysabri scheduling.   PRIOR HPI (10/29/14): 44 year old right-handed female here for evaluation of multiple sclerosis. 10/15/14 patient developed staggering to the left side. The next day she noted left arm and left leg heaviness and weakness. Patient was in the hospital and had evaluation. MRI of the brain demonstrated multiple brain lesions suspicious for demyelinating disease. Patient was treated with IV steroids and her left sided symptoms have almost completely resolved. Patient continues to have a slight limp with left leg difficulty. Also has some slight incoordination with left hand. In the past patient has had intermittent headaches. Patient has also had intermittent episodes of knee pain and intermittent vertigo attacks. Patient has family history of multiple sclerosis in maternal uncle and maternal cousin.    REVIEW OF SYSTEMS: Full 14 system review of systems performed and notable only for rash in arms x 3 weeks headaches.    ALLERGIES: Allergies  Allergen Reactions  .  Iron Shortness Of Breath    IV only   . Delsym [Dextromethorphan Polistirex Er] Other (See Comments)    nightmares  . Hydrocodone-Guaifenesin Other (See Comments)    nightmares  . Doxycycline Other (See Comments)    Bad taste to patient    HOME MEDICATIONS: Outpatient Prescriptions Prior to Visit  Medication Sig Dispense Refill  . ergocalciferol (DRISDOL) 50000 UNITS capsule Take 1 capsule (50,000 Units total) by mouth once a week. 12 capsule 3  . Natalizumab (TYSABRI IV) Inject 300 mg into the vein every 30 (thirty) days.    Marland Kitchen telmisartan-hydrochlorothiazide (MICARDIS HCT) 80-12.5 MG per tablet 1 tab po qd 90 tablet 3  . metaxalone (SKELAXIN) 800 MG tablet Take 1 tablet (800 mg total) by mouth 3 (three) times daily. (Patient not taking: Reported on 06/02/2015) 20 tablet 0  . traMADol (ULTRAM) 50 MG tablet Take 1 tablet (50 mg total) by mouth every 8 (eight) hours as needed. (Patient not taking: Reported on 06/02/2015) 20 tablet 0  . atomoxetine (STRATTERA) 60 MG capsule Take 1 capsule (60 mg total) by mouth daily. (Patient not taking: Reported on 05/21/2015) 90 capsule 1   No facility-administered medications prior to visit.    PAST MEDICAL HISTORY: Past Medical History  Diagnosis Date  . Hypertension   . Smoker   . Allergy   . Anemia   . Depression   . Anxiety   . Sickle cell trait   . Iron overload, transfusional   . Sickle cell anemia     trait  . Clotting disorder     from beeing anemic  . Migraine   . Tachycardia  PAST SURGICAL HISTORY: Past Surgical History  Procedure Laterality Date  . Cervical ablation    . Tubal ligation    . Cholecystectomy    . Cesarean section      3 times    FAMILY HISTORY: Family History  Problem Relation Age of Onset  . Mental illness Mother   . Hyperlipidemia Mother   . Hyperthyroidism Mother   . Mental retardation Mother   . ADD / ADHD Son   . Diabetes Father   . Cancer Father   . Cancer Paternal Aunt     breast  .  Arthritis Maternal Grandmother   . Diabetes Maternal Grandmother   . Hearing loss Maternal Grandmother     left hear when she was a child  . Hyperlipidemia Maternal Grandmother   . Hypertension Maternal Grandmother   . Alcohol abuse Maternal Grandfather   . Early death Maternal Grandfather   . Cancer Paternal Aunt     breast  . Multiple sclerosis Cousin     SOCIAL HISTORY:  History   Social History  . Marital Status: Single    Spouse Name: N/A  . Number of Children: 3  . 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 Smoking status: Current Every Day Smoker -- 0.25 packs/day for 25 years    Types: Cigarettes    Last Attempt to Quit: 10/16/2014  . Smokeless tobacco: Never Used     Comment: smoke 5 cigarettes/day or prn  . Alcohol Use: 0.0 oz/week    0 Standard drinks or equivalent per week     Comment: once maybe a month if that  . Drug Use: No  . Sexual Activity:    Partners: Male    Birth Control/ Protection: Condom   Other Topics Concern  . Not on file   Social History Narrative   Patient lives at home with her family.   Caffeine Use: 1 cup daily     PHYSICAL EXAM  Filed Vitals:   06/02/15 0817  BP: 121/84  Pulse: 93  Height: 5\' 3"  (1.6 m)  Weight: 294 lb 12.8 oz (133.72 kg)    Body mass index is 52.23 kg/(m^2).   Visual Acuity Screening   Right eye Left eye Both eyes  Without correction: 20/30 20/40   With correction:       No flowsheet data found.  GENERAL EXAM: Patient is in no distress; well developed, nourished and groomed; neck is supple; FINE SKIN TONE PAPULES ON ARMS BELOW ELBOWS  CARDIOVASCULAR: Regular rate and rhythm, no murmurs, no carotid bruits  NEUROLOGIC: MENTAL STATUS: awake, alert, language fluent, comprehension intact, naming intact, fund of knowledge appropriate CRANIAL NERVE: pupils equal and reactive to light, visual fields full to confrontation, extraocular muscles  intact, no nystagmus, facial sensation and strength symmetric, hearing intact, palate elevates symmetrically, uvula midline, shoulder shrug symmetric, tongue midline. MOTOR: normal bulk and tone, full strength in the BUE, BLE SENSORY: normal and symmetric to light touch COORDINATION: finger-nose-finger, fine finger movements normal REFLEXES: deep tendon reflexes present and symmetric GAIT/STATION: narrow based gait; STABLE, SMOOTH; ABLE TO TANDEM     DIAGNOSTIC DATA (LABS, IMAGING, TESTING) - I reviewed patient records, labs, notes, testing and imaging myself where available.  Lab Results  Component Value Date   WBC 10.6* 03/05/2015   HGB 11.4* 03/05/2015   HCT 36.4 03/05/2015   MCV 72.8* 03/05/2015   PLT  298 03/05/2015      Component Value Date/Time   NA 141 10/20/2014 0625   K 4.0 10/20/2014 0625   CL 101 10/20/2014 0625   CO2 25 10/20/2014 0625   GLUCOSE 146* 10/20/2014 0625   BUN 23 10/20/2014 0625   CREATININE 0.81 10/20/2014 0625   CREATININE 0.61 08/21/2014 1440   CALCIUM 10.6* 10/20/2014 0625   PROT 7.7 10/16/2014 1557   ALBUMIN 3.9 10/16/2014 1557   AST 17 10/16/2014 1557   ALT 24 10/16/2014 1557   ALKPHOS 87 10/16/2014 1557   BILITOT 0.2* 10/16/2014 1557   GFRNONAA 86* 10/20/2014 0625   GFRAA >90 10/20/2014 0625   Lab Results  Component Value Date   CHOL 170 10/17/2014   HDL 36* 10/17/2014   LDLCALC 97 10/17/2014   TRIG 185* 10/17/2014   CHOLHDL 4.7 10/17/2014   Lab Results  Component Value Date   HGBA1C 6.0 03/05/2015   No results found for: VITAMINB12 Lab Results  Component Value Date   TSH 0.904 12/12/2013   VIT D, 25-HYDROXY  Date Value Ref Range Status  03/05/2015 20* 30 - 100 ng/mL Final    Comment:    Vitamin D Status           25-OH Vitamin D        Deficiency                <20 ng/mL        Insufficiency         20 - 29 ng/mL        Optimal             > or = 30 ng/mL   For 25-OH Vitamin D testing on patients on D2-supplementation  and patients for whom quantitation of D2 and D3 fractions is required, the QuestAssureD 25-OH VIT D, (D2,D3), LC/MS/MS is recommended: order code [id number] (patients > 2 yrs).     10/17/14 MRI brain (without)  1. Multiple T2/FLAIR hyperintense lesions with associated restricted diffusion involving the white matter of both cerebral hemispheres, right greater than left, most consistent with active demyelinating disease. 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 Follow-up examination with postcontrast imaging could be performed for complete evaluation. 2. No other findings to suggest acute intracranial infarct or other process.  10/17/14 MRI cervical spine (with and without)  - Motion degraded exam demonstrating no convincing evidence for spinal MS.  Certainly there are no areas of T2 hyperintensity or cord enlargement. No disc protrusion or spinal stenosis.  10/18/14 MRI brain (with) - Findings consistent with acute demyelination in the RIGHT posterior frontal subcortical white matter in this patient with suspected multiple sclerosis. Multiple other areas of T2 signal hyperintensity on the previous study do not display similar postcontrast enhancement. 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   10/21/14 CSF opening pressure 19cm H2O - (WBC 18, RBC 3545, protein 50, glucose 110, cryptococcal ag negative, IgG index 7.1 (normal), OCB > 5 well defined gamma restriction bands)  10/29/14 JCV ab - negative     ASSESSMENT AND PLAN  44 y.o. 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 Now on tysabri.  PLAN: I spent 25 minutes of face to face time with patient. Greater than 50% of time was spent in counseling and coordination of care with patient. In summary we discussed: - continue tysabri - continue physical activity, PT exercises - repeat MRI and JCV ab every 6 months (next in Sept 2016) - follow up with PCP or dermatology re: rash on arms  Orders Placed This Encounter  Procedures  . MR Brain W Wo Contrast  . CBC with Differential/Platelet  . Comprehensive metabolic panel  . Stratify JCV Antibody Test (Quest)   Return in about 3 months (around 09/02/2015).      Penni Bombard, MD 07/09/4157, 3:09 AM Certified in Neurology, Neurophysiology and Neuroimaging  [facility] [address], [place], [place] [address] [phone]

## 2015-06-08 ENCOUNTER — Ambulatory Visit (INDEPENDENT_AMBULATORY_CARE_PROVIDER_SITE_OTHER): Payer: 59 | Admitting: Family Medicine

## 2015-06-08 VITALS — BP 120/83 | HR 95 | Temp 98.1°F | Resp 16

## 2015-06-08 DIAGNOSIS — R21 Rash and other nonspecific skin eruption: Secondary | ICD-10-CM | POA: Diagnosis not present

## 2015-06-08 MED ORDER — TRIAMCINOLONE ACETONIDE 0.1 % EX CREA: 1.0000 "application " | TOPICAL_CREAM | Freq: Two times a day (BID) | CUTANEOUS | Status: DC

## 2015-06-08 NOTE — Progress Notes (Signed)
[facility] [address], [place] [address] [phone]  Date:  06/08/2015   Name:  [name]   DOB:  06/25/1971   MRN:  [id number]  PCP:  Jenny Reichmann, MD    Chief Complaint: Rash   History of Present Illness:  [name] is a 44 y.o. very pleasant female patient who presents with the following:  She has a history of MS.  On 7/4 she noted a rash on her left forearm.  This seems to be getting worse, it itching and rasied now.  She does not have any other rash Her neurologist Dr. Leta Baptist wondered if this could be related to her Tysabri infusions, but she does not have any other rash or skin reaction.  Perhaps this is some sort of photosensitivity that appears on the forearm that is exposed to sun while driving  She did use some different shower gel recently - no other exposures to new products or drugs She tried an OTC itch cream which did not help that much She has been tested with KOH and it was negative for hyphae She OW feels well  Patient Active Problem List   Diagnosis Date Noted  . Prediabetes   . MS (multiple sclerosis)   . Obesity, Class III, BMI 40-49.9 (morbid obesity) 10/16/2014  . Smoker 10/16/2014  . OSA on CPAP 10/16/2014  . HTN (hypertension) 01/14/2012  . Sickle cell trait 01/14/2012    Past Medical History  Diagnosis Date  . Hypertension   . Smoker   . Allergy   . Anemia   . Depression   . Anxiety   . Sickle cell trait   . Iron overload, transfusional   . Sickle cell anemia     trait  . Clotting disorder     from beeing anemic  . Migraine   . Tachycardia     Past Surgical History  Procedure Laterality Date  . Cervical ablation    . Tubal ligation    . Cholecystectomy    . Cesarean section      3 times    History  Substance Use Topics  . Smoking status: Current Every Day Smoker -- 0.25 packs/day for 25 years    Types: Cigarettes    Last Attempt to Quit: 10/16/2014  . Smokeless tobacco: Never Used      Comment: smoke 5 cigarettes/day or prn  . Alcohol Use: 0.0 oz/week    0 Standard drinks or equivalent per week     Comment: once maybe a month if that    Family History  Problem Relation Age of Onset  . Mental illness Mother   . Hyperlipidemia Mother   . Hyperthyroidism Mother   . Mental retardation Mother   . ADD / ADHD Son   . Diabetes Father   . Cancer Father   . Cancer Paternal Aunt     breast  . Arthritis Maternal Grandmother   . Diabetes Maternal Grandmother   . Hearing loss Maternal Grandmother     left hear when she was a child  . Hyperlipidemia Maternal Grandmother   . Hypertension Maternal Grandmother   . Alcohol abuse Maternal Grandfather   . Early death Maternal Grandfather   . Cancer Paternal Aunt     breast  . Multiple sclerosis Cousin     Allergies  Allergen Reactions  . Iron Shortness Of Breath    IV only   . Delsym [Dextromethorphan Polistirex Er] Other (See  Comments)    nightmares  . Hydrocodone-Guaifenesin Other (See Comments)    nightmares  . Doxycycline Other (See Comments)    Bad taste to patient    Medication list has been reviewed and updated.  Current Outpatient Prescriptions on File Prior to Visit  Medication Sig Dispense Refill  . ergocalciferol (DRISDOL) 50000 UNITS capsule Take 1 capsule (50,000 Units total) by mouth once a week. 12 capsule 3  . Natalizumab (TYSABRI IV) Inject 300 mg into the vein every 30 (thirty) days.    Marland Kitchen telmisartan-hydrochlorothiazide (MICARDIS HCT) 80-12.5 MG per tablet 1 tab po qd 90 tablet 3  . metaxalone (SKELAXIN) 800 MG tablet Take 1 tablet (800 mg total) by mouth 3 (three) times daily. (Patient not taking: Reported on 06/02/2015) 20 tablet 0  . traMADol (ULTRAM) 50 MG tablet Take 1 tablet (50 mg total) by mouth every 8 (eight) hours as needed. (Patient not taking: Reported on 06/02/2015) 20 tablet 0   No current facility-administered medications on file prior to visit.    Review of Systems:  As per  HPI- otherwise negative.   Physical Examination: Filed Vitals:   06/08/15 1109  BP: 120/83  Pulse: 95  Temp: 98.1 F (36.7 C)  Resp: 16   There were no vitals filed for this visit. There is no weight on file to calculate BMI. Ideal Body Weight:    GEN: WDWN, NAD, Non-toxic, A & O x 3, obese, looks well HEENT: Atraumatic, Normocephalic. Neck supple. No masses, No LAD. Ears and Nose: No external deformity. CV: RRR, No M/G/R. No JVD. No thrill. No extra heart sounds. PULM: CTA B, no wheezes, crackles, rhonchi. No retractions. No resp. distress. No accessory muscle use. EXTR: No c/c/e NEURO Normal gait.  PSYCH: Normally interactive. Conversant. Not depressed or anxious appearing.  Calm demeanor.  Left arm: there is a mildly raised, silvery scaled mild rash on the dorsal surface of the left forearm.  No other rash noted, no oral lesions  No ulcers or wound.  No redness or heat  Assessment and Plan: Rash and nonspecific skin eruption - Plan: triamcinolone cream (KENALOG) 0.1 %  Uncertain cause of rash.  May be photosen vs other contact.  Negative KOH already done per pt Will treat with triamcinolone cream and she will let me know if not improved soon- Sooner if worse.     Signed Lamar Blinks, MD

## 2015-06-08 NOTE — Patient Instructions (Signed)
Try the triamcinolone cream twice a day for 1-2 weeks. If this does not get rid of your rash please let me know!

## 2015-06-15 ENCOUNTER — Ambulatory Visit: Payer: 59

## 2015-07-08 ENCOUNTER — Ambulatory Visit (HOSPITAL_COMMUNITY)
Admission: RE | Admit: 2015-07-08 | Discharge: 2015-07-08 | Disposition: A | Discharge status: Home / Self Care | Payer: 59 | Source: Ambulatory Visit | Attending: Diagnostic Neuroimaging | Admitting: Diagnostic Neuroimaging

## 2015-07-08 DIAGNOSIS — G35 Multiple sclerosis: Secondary | ICD-10-CM | POA: Diagnosis present

## 2015-07-08 DIAGNOSIS — R930 Abnormal findings on diagnostic imaging of skull and head, not elsewhere classified: Secondary | ICD-10-CM | POA: Diagnosis not present

## 2015-07-08 LAB — CREATININE, SERUM
Creatinine, Ser: 0.88 mg/dL (ref 0.44–1.00)
GFR calc non Af Amer: >60 mL/min (ref >60)

## 2015-07-08 MED ORDER — GADOBENATE DIMEGLUMINE 529 MG/ML IV SOLN
20.0000 mL | Freq: Once | INTRAVENOUS | Status: AC
  Administered 2015-07-08: 20 mL via INTRAVENOUS

## 2015-07-09 ENCOUNTER — Telehealth: Payer: Self-pay

## 2015-07-09 ENCOUNTER — Encounter: Payer: Self-pay | Admitting: *Deleted

## 2015-07-09 NOTE — Telephone Encounter (Signed)
Spoke to patient. Gave MRI results. Patient verbalized understanding.  

## 2015-07-09 NOTE — Telephone Encounter (Signed)
-----   Message from Penni Bombard, MD sent at 07/09/2015  8:21 AM EDT ----- I reviewed images myself. Agree with report. MS plaques have decreased in size. Continue current plan. -VRP

## 2015-07-22 ENCOUNTER — Telehealth: Payer: Self-pay | Admitting: Diagnostic Neuroimaging

## 2015-07-22 NOTE — Telephone Encounter (Signed)
[name] with [employer] called stating re-auth for Tysabri. Needs before 07/27/15. Please call and advise. She can be reached at [phone].

## 2015-07-23 ENCOUNTER — Telehealth: Payer: Self-pay | Admitting: *Deleted

## 2015-07-23 ENCOUNTER — Other Ambulatory Visit: Payer: 59

## 2015-07-23 NOTE — Telephone Encounter (Signed)
Patient returned your call.

## 2015-07-23 NOTE — Telephone Encounter (Addendum)
Office closed, opens at 8:30 am ET. Spoke with [employer] rep who states they need correct fax number. Provided her with this RN's pod fax #. She stated she would fax re-auth form for Dr Leta Baptist to complete.  Paper completed, signed and successfully faxed to [place].

## 2015-07-23 NOTE — Telephone Encounter (Signed)
Spoke with patient who requests to have labs drawn on 08/10/15 because she will be coming to this office for her Tysabri infusion. Informed her that would be fine but to have labs drawn prior to receiving her infusion. She verbalized understanding.

## 2015-07-23 NOTE — Telephone Encounter (Signed)
Left vm requesting patient call back today, re: labs Dr Leta Baptist ordered and Tysabri infusion site. Left this caller's name and number.

## 2015-08-10 ENCOUNTER — Other Ambulatory Visit (INDEPENDENT_AMBULATORY_CARE_PROVIDER_SITE_OTHER): Payer: Self-pay

## 2015-08-10 DIAGNOSIS — G35 Multiple sclerosis: Secondary | ICD-10-CM

## 2015-08-10 DIAGNOSIS — Z0289 Encounter for other administrative examinations: Secondary | ICD-10-CM

## 2015-08-11 LAB — COMPREHENSIVE METABOLIC PANEL
A/G RATIO: 1.5 (ref 1.1–2.5)
ALT: 23 IU/L (ref 0–32)
AST: 18 IU/L (ref 0–40)
Albumin: 4 g/dL (ref 3.5–5.5)
Alkaline Phosphatase: 84 IU/L (ref 39–117)
BILIRUBIN TOTAL: 0.3 mg/dL (ref 0.0–1.2)
BUN / CREAT RATIO: 12 (ref 9–23)
BUN: 9 mg/dL (ref 6–24)
CALCIUM: 10.2 mg/dL (ref 8.7–10.2)
CO2: 23 mmol/L (ref 18–29)
Chloride: 103 mmol/L (ref 97–108)
Creatinine, Ser: 0.75 mg/dL (ref 0.57–1.00)
GFR calc Af Amer: 111 mL/min/{1.73_m2} (ref >59)
GFR, EST NON AFRICAN AMERICAN: 97 mL/min/{1.73_m2} (ref >59)
Globulin, Total: 2.6 g/dL (ref 1.5–4.5)
Glucose: 109 mg/dL — ABNORMAL HIGH (ref 65–99)
Potassium: 4 mmol/L (ref 3.5–5.2)
SODIUM: 140 mmol/L (ref 134–144)
Total Protein: 6.6 g/dL (ref 6.0–8.5)

## 2015-08-11 LAB — CBC WITH DIFFERENTIAL/PLATELET
Basophils Absolute: 0 10*3/uL (ref 0.0–0.2)
Basos: 0 %
EOS (ABSOLUTE): 0.4 10*3/uL (ref 0.0–0.4)
Eos: 4 %
Hematocrit: 36.1 % (ref 34.0–46.6)
Hemoglobin: 11.1 g/dL (ref 11.1–15.9)
IMMATURE GRANS (ABS): 0 10*3/uL (ref 0.0–0.1)
IMMATURE GRANULOCYTES: 0 %
LYMPHS: 30 %
Lymphocytes Absolute: 3.2 10*3/uL — ABNORMAL HIGH (ref 0.7–3.1)
MCH: 22 pg — AB (ref 26.6–33.0)
MCHC: 30.7 g/dL — ABNORMAL LOW (ref 31.5–35.7)
MCV: 72 fL — ABNORMAL LOW (ref 79–97)
Monocytes Absolute: 0.6 10*3/uL (ref 0.1–0.9)
Monocytes: 6 %
NEUTROS PCT: 60 %
Neutrophils Absolute: 6.3 10*3/uL (ref 1.4–7.0)
PLATELETS: 243 10*3/uL (ref 150–379)
RBC: 5.04 x10E6/uL (ref 3.77–5.28)
RDW: 18.3 % — AB (ref 12.3–15.4)
WBC: 10.6 10*3/uL (ref 3.4–10.8)

## 2015-08-18 ENCOUNTER — Encounter: Payer: Self-pay | Admitting: Emergency Medicine

## 2015-08-31 ENCOUNTER — Telehealth: Payer: Self-pay | Admitting: *Deleted

## 2015-08-31 NOTE — Telephone Encounter (Signed)
Left VM requesting patient call back for lab results. Left this caller's name, number.

## 2015-09-01 NOTE — Telephone Encounter (Signed)
Pt called returning MaryClare's call.  Pt can be reached at (w) [phone] until 4:30

## 2015-09-01 NOTE — Telephone Encounter (Signed)
Per Dr Leta Baptist, spoke with patient and informed her that her JCV test was negative. She verbalized understanding, appreciation.

## 2015-09-07 ENCOUNTER — Ambulatory Visit (INDEPENDENT_AMBULATORY_CARE_PROVIDER_SITE_OTHER): Payer: 59 | Admitting: Diagnostic Neuroimaging

## 2015-09-07 ENCOUNTER — Encounter: Payer: Self-pay | Admitting: Diagnostic Neuroimaging

## 2015-09-07 VITALS — BP 124/83 | HR 92 | Ht 63.0 in | Wt 298.0 lb

## 2015-09-07 DIAGNOSIS — G35 Multiple sclerosis: Secondary | ICD-10-CM

## 2015-09-07 NOTE — Patient Instructions (Signed)
Thank you for coming to see [place] at [facility]. I hope we have been able to provide you high quality care today.  You may receive a patient satisfaction survey over the next few weeks. We would appreciate your feedback and comments so that we may continue to improve ourselves and the health of our patients.  - continue tysabri - follow up in 3 months   ~~~~~~~~~~~~~~~~~~~~~~~~~~~~~~~~~~~~~~~~~~~~~~~~~~~~~~~~~~~~~~~~~  DR. PENUMALLI'S GUIDE TO HAPPY AND HEALTHY LIVING These are some of my general health and wellness recommendations. Some of them may apply to you better than others. Please use common sense as you try these suggestions and feel free to ask me any questions.   ACTIVITY/FITNESS Mental, social, emotional and physical stimulation are very important for brain and body health. Try learning a new activity (arts, music, language, sports, games).  Keep moving your body to the best of your abilities. You can do this at home, inside or outside, the park, community center, gym or anywhere you like. Consider a physical therapist or personal trainer to get started. Consider the app Sworkit. Fitness trackers such as smart-watches, smart-phones or Fitbits can help as well.   NUTRITION Eat more plants: colorful vegetables, nuts, seeds and berries.  Eat less sugar, salt, preservatives and processed foods.  Avoid toxins such as cigarettes and alcohol.  Drink water when you are thirsty. Warm water with a slice of lemon is an excellent morning drink to start the day.  Consider these websites for more information The Nutrition Source (https://www.henry-hernandez.biz/) Precision Nutrition (WindowBlog.ch)   RELAXATION Consider practicing mindfulness meditation or other relaxation techniques such as deep breathing, prayer, yoga, tai chi, massage. See website mindful.org or the apps Headspace or Calm to help get  started.   SLEEP Try to get at least 7-8+ hours sleep per day. Regular exercise and reduced caffeine will help you sleep better. Practice good sleep hygeine techniques. See website sleep.org for more information.   PLANNING Prepare estate planning, living will, healthcare POA documents. Sometimes this is best planned with the help of an attorney. Theconversationproject.org and agingwithdignity.org are excellent resources.

## 2015-09-07 NOTE — Progress Notes (Signed)
[facility]  PATIENT: [name] DOB: [birth year]  REFERRING CLINICIAN:  HISTORY FROM: patient  REASON FOR VISIT: follow up   HISTORICAL  CHIEF COMPLAINT:  Chief Complaint  Patient presents with  . Multiple Sclerosis    rm 7  . Follow-up    3 month    HISTORY OF PRESENT ILLNESS:   UPDATE 09/07/15: Since last visit, doing well. Tolerating tysabri. No new events neurologically. Some more leg swelling lately, esp with sitting for long time at work. 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  UPDATE 06/02/15: Doing well. Strength good. No numbness. Fatigue better. Concentration is better. Intermittent word finding diff (scanning speech). Tolerating tysabri infusions. 3 weeks ago noted fine small bumps on arms, mild itching. Some int headaches in temporal regions, mild.   UPDATE 03/03/15: Since last visit, more right hip and low back pain. Intermittent. Had tysabri on March 14 and Apr 11. Overall c/o fatigue, focusing issues. 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  UPDATE 12/01/14: Since last visit, left side weakness has improved. Now having more problems with right knee pain, esp walking longer distances. Awaiting tysabri scheduling.   PRIOR HPI (10/29/14): 44 year old right-handed female here for evaluation of multiple sclerosis. 10/15/14 patient developed staggering to the left side. The next day she noted left arm and left leg heaviness and weakness. Patient was in the hospital and had evaluation. MRI of the brain demonstrated multiple brain lesions suspicious for demyelinating disease. Patient was treated with IV steroids and her left sided symptoms have almost completely resolved. Patient continues to have a slight limp with left leg difficulty. Also has some slight incoordination with left hand. In the past patient has had intermittent headaches. Patient has also had intermittent episodes of knee pain  and intermittent vertigo attacks. Patient has family history of multiple sclerosis in maternal uncle and maternal cousin.    REVIEW OF SYSTEMS: Full 14 system review of systems performed and notable only as per HPI.   ALLERGIES: Allergies  Allergen Reactions  . Iron Shortness Of Breath    IV only   . Delsym [Dextromethorphan Polistirex Er] Other (See Comments)    nightmares  . Hydrocodone-Guaifenesin Other (See Comments)    nightmares  . Doxycycline Other (See Comments)    Bad taste to patient    HOME MEDICATIONS: Outpatient Prescriptions Prior to Visit  Medication Sig Dispense Refill  . ergocalciferol (DRISDOL) 50000 UNITS capsule Take 1 capsule (50,000 Units total) by mouth once a week. 12 capsule 3  . Natalizumab (TYSABRI IV) Inject 300 mg into the vein every 30 (thirty) days.    Marland Kitchen telmisartan-hydrochlorothiazide (MICARDIS HCT) 80-12.5 MG per tablet 1 tab po qd 90 tablet 3  . triamcinolone cream (KENALOG) 0.1 % Apply 1 application topically 2 (two) times daily. Use as needed 45 g 0   No facility-administered medications prior to visit.    PAST MEDICAL HISTORY: Past Medical History  Diagnosis Date  . Hypertension   . Smoker   . Allergy   . Anemia   . Depression   . Anxiety   . Sickle cell trait ([place])   . Iron overload, transfusional   . Sickle cell anemia (HCC)     trait  . Clotting disorder ([place])     from beeing anemic  . Migraine   . Tachycardia     PAST SURGICAL HISTORY: Past Surgical History  Procedure  Laterality Date  . Cervical ablation    . Tubal ligation    . Cholecystectomy    . Cesarean section      3 times    FAMILY HISTORY: Family History  Problem Relation Age of Onset  . Mental illness Mother   . Hyperlipidemia Mother   . Hyperthyroidism Mother   . Mental retardation Mother   . ADD / ADHD Son   . Diabetes Father   . Cancer Father   . Cancer Paternal Aunt     breast  . Arthritis Maternal Grandmother   . Diabetes Maternal Grandmother    . Hearing loss Maternal Grandmother     left hear when she was a child  . Hyperlipidemia Maternal Grandmother   . Hypertension Maternal Grandmother   . Alcohol abuse Maternal Grandfather   . Early death Maternal Grandfather   . Cancer Paternal Aunt     breast  . Multiple sclerosis Cousin     SOCIAL HISTORY:  Social History   Social History  . Marital Status: Single    Spouse Name: N/A  . Number of Children: 3  . 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 Smoking status: Current Every Day Smoker -- 0.25 packs/day for 25 years    Types: Cigarettes    Last Attempt to Quit: 10/16/2014  . Smokeless tobacco: Never Used     Comment: smoke 5 cigarettes/day or prn  . Alcohol Use: 0.0 oz/week    0 Standard drinks or equivalent per week     Comment: once maybe a month if that  . Drug Use: No  . Sexual Activity:    Partners: Male    Birth Control/ Protection: Condom   Other Topics Concern  . Not on file   Social History Narrative   Patient lives at home with her family.   Caffeine Use: 1 cup daily     PHYSICAL EXAM  Filed Vitals:   09/07/15 0805  BP: 124/83  Pulse: 92  Height: 5\' 3"  (1.6 m)  Weight: 298 lb (135.172 kg)    Body mass index is 52.8 kg/(m^2).  No exam data present  No flowsheet data found.  GENERAL EXAM: Patient is in no distress; well developed, nourished and groomed; neck is supple; FINE SKIN TONE PAPULES ON ARMS BELOW ELBOWS  CARDIOVASCULAR: Regular rate and rhythm, no murmurs, no carotid bruits  NEUROLOGIC: MENTAL STATUS: awake, alert, language fluent, comprehension intact, naming intact, fund of knowledge appropriate CRANIAL NERVE: pupils equal and reactive to light, visual fields full to confrontation, extraocular muscles intact, no nystagmus, facial sensation and strength symmetric, hearing intact, palate elevates symmetrically, uvula midline, shoulder shrug symmetric, tongue  midline. MOTOR: normal bulk and tone, full strength in the BUE, BLE SENSORY: normal and symmetric to light touch COORDINATION: finger-nose-finger, fine finger movements normal REFLEXES: deep tendon reflexes present and symmetric GAIT/STATION: narrow based gait; STABLE, SMOOTH     DIAGNOSTIC DATA (LABS, IMAGING, TESTING) - I reviewed patient records, labs, notes, testing and imaging myself where available.  Lab Results  Component Value Date   WBC 10.6 08/10/2015   HGB 11.4* 03/05/2015   HCT 36.1 08/10/2015   MCV 72.8* 03/05/2015   PLT 298 03/05/2015      Component Value Date/Time   NA 140 08/10/2015 0818   NA 141 10/20/2014 0625   K 4.0 08/10/2015 0818   CL 103 08/10/2015 0818  CO2 23 08/10/2015 0818   GLUCOSE 109* 08/10/2015 0818   GLUCOSE 146* 10/20/2014 0625   BUN 9 08/10/2015 0818   BUN 23 10/20/2014 0625   CREATININE 0.75 08/10/2015 0818   CREATININE 0.61 08/21/2014 1440   CALCIUM 10.2 08/10/2015 0818   PROT 6.6 08/10/2015 0818   PROT 7.7 10/16/2014 1557   ALBUMIN 4.0 08/10/2015 0818   ALBUMIN 3.9 10/16/2014 1557   AST 18 08/10/2015 0818   ALT 23 08/10/2015 0818   ALKPHOS 84 08/10/2015 0818   BILITOT 0.3 08/10/2015 0818   BILITOT 0.2* 10/16/2014 1557   GFRNONAA 97 08/10/2015 0818   GFRAA 111 08/10/2015 0818   Lab Results  Component Value Date   CHOL 170 10/17/2014   HDL 36* 10/17/2014   LDLCALC 97 10/17/2014   TRIG 185* 10/17/2014   CHOLHDL 4.7 10/17/2014   Lab Results  Component Value Date   HGBA1C 6.0 03/05/2015   No results found for: VITAMINB12 Lab Results  Component Value Date   TSH 0.904 12/12/2013   VIT D, 25-HYDROXY  Date Value Ref Range Status  03/05/2015 20* 30 - 100 ng/mL Final    Comment:    Vitamin D Status           25-OH Vitamin D        Deficiency                <20 ng/mL        Insufficiency         20 - 29 ng/mL        Optimal             > or = 30 ng/mL   For 25-OH Vitamin D testing on patients on D2-supplementation  and patients for whom quantitation of D2 and D3 fractions is required, the QuestAssureD 25-OH VIT D, (D2,D3), LC/MS/MS is recommended: order code [id number] (patients > 2 yrs).     10/17/14 MRI brain (without)  1. Multiple T2/FLAIR hyperintense lesions with associated restricted diffusion involving the white matter of both cerebral hemispheres, right greater than left, most consistent with active demyelinating disease. 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 Follow-up examination with postcontrast imaging could be performed for complete evaluation. 2. No other findings to suggest acute intracranial infarct or other process.  10/17/14 MRI cervical spine (with and without)  - Motion degraded exam demonstrating no convincing evidence for spinal MS.  Certainly there are no areas of T2 hyperintensity or cord enlargement. No disc protrusion or spinal stenosis.  10/18/14 MRI brain (with) - Findings consistent with acute demyelination in the RIGHT posterior frontal subcortical white matter in this patient with suspected multiple sclerosis. Multiple other areas of T2 signal hyperintensity on the previous study do not display similar postcontrast enhancement. 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  07/08/15 MRI brain [I reviewed images myself and agree with interpretation. -VRP]  1. Regression of multiple sclerosis since December 2015. No active demyelination. 2. Tiny 5 mm right posterior convexity meningioma versus benign dural thickening. This is stable and appears inconsequential.   10/21/14 CSF opening pressure 19cm H2O - (WBC 18, RBC 3545, protein 50, glucose 110, cryptococcal ag negative, IgG index 7.1 (normal), OCB > 5  well defined gamma restriction bands)  10/29/14 JCV ab - negative  08/28/15 JCV ab - negative     ASSESSMENT AND PLAN  44 y.o. 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 Now on tysabri and doing very well.   PLAN: - continue tysabri - continue physical activity, PT exercises - repeat MRI and JCV ab every 6 months (next in Mar 2017)  Return in about 3 months (around 12/08/2015).      Penni Bombard, MD 38/25/0539, 7:67 AM Certified in Neurology, Neurophysiology and Neuroimaging  [facility] [address], [place], [place] [address] [phone]

## 2015-09-18 NOTE — Therapy (Signed)
[facility] [address] [place], [place], [address] Phone: [phone]   Fax:  [phone]  Patient Details  Name: [name] MRN: [id number] Date of Birth: 1971/07/07 Referring Provider:  No ref. provider found  Encounter Date: 09/18/2015  PHYSICAL THERAPY DISCHARGE SUMMARY  Visits from Start of Care: 5  Current functional level related to goals / functional outcomes: PT LONG TERM GOAL #1    Title Pt will be independent in HEP to improve functional mobility and strength. Target date: 11/26/14.   Status On-going   PT LONG TERM GOAL #2   Title Perform BERG and write goal if appropriate. Target date: 11/26/14.   Status Achieved   PT LONG TERM GOAL #3   Title Pt will be able to ambulate >400' over even/uneven terrain, independently without LOB, in order to improve functional mobility. Target date: 11/26/14.   Status On-going   PT LONG TERM GOAL #4   Title Pt will verbalize plans to join gym at PT d/c, to exercise with her daughter and maintain strength gains made in PT. Target date: 11/26/14.   Status On-going   PT LONG TERM GOAL #5   Title Pt will improve ABC score by 10% to improve quality of life. Target date: 11/26/14.   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 Target date: 11/26/14.   Status On-going   PT LONG TERM GOAL #7   Title Perform DGI and write goal if appropriate. Target date: 11/26/14.   Baseline Not appropriate as pt scored 20/24.   Status Achieved   PT LONG TERM GOAL #8   Title Pt will improve BERG score to >/=53/56 to decrease falls risk. Target date: 11/26/14.   Status New                    Remaining deficits: Unknown, as pt did not return since last visit.   Education / Equipment: HEP  Plan: Patient agrees to discharge.  Patient goals were  partially met. Patient is being discharged due to not returning since the last visit.  ?????       Jaymian Bogart L 09/18/2015, 12:39 PM  [facility] [address] [place], [place], [address] Phone: [phone]   Fax:  [phone]   Geoffry Paradise, PT,DPT 09/18/2015 12:41 PM Phone: [phone] Fax: [phone]

## 2015-11-11 ENCOUNTER — Ambulatory Visit (INDEPENDENT_AMBULATORY_CARE_PROVIDER_SITE_OTHER): Payer: 59

## 2015-11-11 ENCOUNTER — Ambulatory Visit (INDEPENDENT_AMBULATORY_CARE_PROVIDER_SITE_OTHER): Payer: 59 | Admitting: Emergency Medicine

## 2015-11-11 VITALS — BP 126/80 | HR 103 | Temp 98.9°F | Resp 16

## 2015-11-11 DIAGNOSIS — R6883 Chills (without fever): Secondary | ICD-10-CM | POA: Diagnosis not present

## 2015-11-11 DIAGNOSIS — R739 Hyperglycemia, unspecified: Secondary | ICD-10-CM

## 2015-11-11 DIAGNOSIS — R062 Wheezing: Secondary | ICD-10-CM

## 2015-11-11 DIAGNOSIS — R059 Cough, unspecified: Secondary | ICD-10-CM

## 2015-11-11 DIAGNOSIS — I10 Essential (primary) hypertension: Secondary | ICD-10-CM | POA: Diagnosis not present

## 2015-11-11 DIAGNOSIS — R05 Cough: Secondary | ICD-10-CM

## 2015-11-11 LAB — LIPID PANEL
CHOL/HDL RATIO: 4.5 ratio (ref <=5.0)
CHOLESTEROL: 166 mg/dL (ref 125–200)
HDL: 37 mg/dL — AB (ref >=46)
LDL Cholesterol: 105 mg/dL (ref <130)
Triglycerides: 119 mg/dL (ref <150)
VLDL: 24 mg/dL (ref <30)

## 2015-11-11 LAB — COMPLETE METABOLIC PANEL WITH GFR
ALT: 21 U/L (ref 6–29)
AST: 18 U/L (ref 10–35)
Albumin: 3.9 g/dL (ref 3.6–5.1)
Alkaline Phosphatase: 82 U/L (ref 33–115)
BUN: 9 mg/dL (ref 7–25)
CO2: 23 mmol/L (ref 20–31)
CREATININE: 0.75 mg/dL (ref 0.50–1.10)
Calcium: 10 mg/dL (ref 8.6–10.2)
Chloride: 107 mmol/L (ref 98–110)
Glucose, Bld: 100 mg/dL — ABNORMAL HIGH (ref 65–99)
Potassium: 4.7 mmol/L (ref 3.5–5.3)
Sodium: 141 mmol/L (ref 135–146)
Total Bilirubin: 0.2 mg/dL (ref 0.2–1.2)
Total Protein: 7 g/dL (ref 6.1–8.1)

## 2015-11-11 LAB — POCT CBC
Granulocyte percent: 64.8 %G (ref 37–80)
HEMATOCRIT: 35.3 % — AB (ref 37.7–47.9)
Hemoglobin: 11.2 g/dL — AB (ref 12.2–16.2)
LYMPH, POC: 3.2 (ref 0.6–3.4)
MCH, POC: 22.4 pg — AB (ref 27–31.2)
MCHC: 31.7 g/dL — AB (ref 31.8–35.4)
MCV: 70.6 fL — AB (ref 80–97)
MID (cbc): 0.3 (ref 0–0.9)
MPV: 10.5 fL (ref 0–99.8)
PLATELET COUNT, POC: 259 10*3/uL (ref 142–424)
POC GRANULOCYTE: 6.5 (ref 2–6.9)
POC LYMPH PERCENT: 32.5 %L (ref 10–50)
POC MID %: 2.7 %M (ref 0–12)
RBC: 4.99 M/uL (ref 4.04–5.48)
RDW, POC: 16.7 %
WBC: 10 10*3/uL (ref 4.6–10.2)

## 2015-11-11 LAB — POCT RAPID STREP A (OFFICE): RAPID STREP A SCREEN: NEGATIVE

## 2015-11-11 LAB — GLUCOSE, POCT (MANUAL RESULT ENTRY): POC Glucose: 115 mg/dl — AB (ref 70–99)

## 2015-11-11 LAB — POCT INFLUENZA A/B
Influenza A, POC: NEGATIVE
Influenza B, POC: NEGATIVE

## 2015-11-11 LAB — POCT GLYCOSYLATED HEMOGLOBIN (HGB A1C): HEMOGLOBIN A1C: 6

## 2015-11-11 MED ORDER — AZITHROMYCIN 250 MG PO TABS: ORAL_TABLET | ORAL | Status: DC

## 2015-11-11 MED ORDER — ALBUTEROL SULFATE (2.5 MG/3ML) 0.083% IN NEBU
2.5000 mg | INHALATION_SOLUTION | Freq: Once | RESPIRATORY_TRACT | Status: AC
  Administered 2015-11-11: 2.5 mg via RESPIRATORY_TRACT

## 2015-11-11 MED ORDER — ALBUTEROL SULFATE HFA 108 (90 BASE) MCG/ACT IN AERS: 2.0000 | INHALATION_SPRAY | RESPIRATORY_TRACT | Status: DC | PRN

## 2015-11-11 MED ORDER — BENZONATATE 100 MG PO CAPS: 100.0000 mg | ORAL_CAPSULE | Freq: Three times a day (TID) | ORAL | Status: DC | PRN

## 2015-11-11 NOTE — Progress Notes (Addendum)
Patient ID: [name], female   DOB: May 03, 1971, 44 y.o.   MRN: [id number]     By signing my name below, I, [name], attest that this documentation has been prepared under the direction and in the presence of Jodi Queen, MD.  Electronically Signed: Zola [name], Medical Scribe. 11/11/2015. 8:13 AM.   Chief Complaint:  Chief Complaint  Patient presents with  . Cough  . Headache  . Dizziness    HPI: [name] is a 44 y.o. female who reports to [facility] today complaining of gradual onset productive cough that started 2 days ago. Patient also reports having intermittent wheeze, congestion, and rhinorrhea. Last night, she began having chills, lightheadedness, and fatigue. She has been having difficulty sleeping due to her symptoms. She does not currently have an inhaler at home. Patient denies sore throat and fever. She admits to smoking 5-6 cigarettes a day. She did receive her flu shot this year.  Past Medical History  Diagnosis Date  . Hypertension   . Smoker   . Allergy   . Anemia   . Depression   . Anxiety   . Sickle cell trait ([place])   . Iron overload, transfusional   . Sickle cell anemia (HCC)     trait  . Clotting disorder ([place])     from beeing anemic  . Migraine   . Tachycardia    Past Surgical History  Procedure Laterality Date  . Cervical ablation    . Tubal ligation    . Cholecystectomy    . Cesarean section      3 times   Social History   Social History  . Marital Status: Single    Spouse Name: N/A  . Number of Children: 3  . 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 Smoking status: Current Every Day Smoker -- 0.25 packs/day for 25 years    Types: Cigarettes    Last Attempt to Quit: 10/16/2014  . Smokeless tobacco: Never Used     Comment: smoke 5 cigarettes/day or prn  . Alcohol Use: 0.0 oz/week    0 Standard drinks or equivalent per week     Comment: once maybe a month if that  .  Drug Use: No  . Sexual Activity:    Partners: Male    Birth Control/ Protection: Condom   Other Topics Concern  . None   Social History Narrative   Patient lives at home with her family.   Caffeine Use: 1 cup daily   Family History  Problem Relation Age of Onset  . Mental illness Mother   . Hyperlipidemia Mother   . Hyperthyroidism Mother   . Mental retardation Mother   . ADD / ADHD Son   . Diabetes Father   . Cancer Father   . Cancer Paternal Aunt     breast  . Arthritis Maternal Grandmother   . Diabetes Maternal Grandmother   . Hearing loss Maternal Grandmother     left hear when she was a child  . Hyperlipidemia Maternal Grandmother   . Hypertension Maternal Grandmother   . Alcohol abuse Maternal Grandfather   . Early death Maternal Grandfather   . Cancer Paternal Aunt     breast  . Multiple sclerosis Cousin    Allergies  Allergen Reactions  . Iron Shortness Of Breath    IV only   . Delsym [Dextromethorphan Polistirex Er]  Other (See Comments)    nightmares  . Hydrocodone-Guaifenesin Other (See Comments)    nightmares  . Doxycycline Other (See Comments)    Bad taste to patient   Prior to Admission medications   Medication Sig Start Date End Date Taking? Authorizing Provider  aspirin 81 MG tablet Take 81 mg by mouth daily.   Yes Historical Provider, MD  ergocalciferol (DRISDOL) 50000 UNITS capsule Take 1 capsule (50,000 Units total) by mouth once a week. 04/21/15 04/20/16 Yes [name], MD  Natalizumab (TYSABRI IV) Inject 300 mg into the vein every 30 (thirty) days.   Yes Historical Provider, MD  telmisartan-hydrochlorothiazide (MICARDIS HCT) 80-12.5 MG per tablet 1 tab po qd 11/26/14  Yes [name], MD     ROS: The patient denies fevers, unintentional weight loss, chest pain, palpitations, dyspnea on exertion, nausea, vomiting, abdominal pain, dysuria, hematuria, melena.   All other systems have been reviewed and were otherwise negative with the exception  of those mentioned in the HPI and as above.    PHYSICAL EXAM: Filed Vitals:   11/11/15 0810  BP: 126/80  Pulse: 103  Temp: 98.9 F (37.2 C)  Resp: 16   There is no weight on file to calculate BMI.   General: Alert, no acute distress. Ill, but not toxic appearing. HEENT:  Normocephalic, atraumatic, oropharynx patent. Throat slightly red. Ears normal. Eye: [facility], [facility] Cardiovascular:  Regular rate and rhythm, no rubs murmurs or gallops.  No Carotid bruits, radial pulse intact. No pedal edema.  Respiratory: Occasional expiratory wheezes. Good air exchange. No rales. Abdominal: No organomegaly, abdomen is soft and non-tender, positive bowel sounds.  No masses. Musculoskeletal: Gait intact. No edema, tenderness Skin: No rashes. Neurologic: Facial musculature symmetric. Psychiatric: Patient acts appropriately throughout our interaction. Lymphatic: No cervical or submandibular lymphadenopathy       LABS:  Results for orders placed or performed in visit on 11/11/15  POCT CBC  Result Value Ref Range   WBC 10.0 4.6 - 10.2 K/uL   Lymph, poc 3.2 0.6 - 3.4   POC LYMPH PERCENT 32.5 10 - 50 %L   MID (cbc) 0.3 0 - 0.9   POC MID % 2.7 0 - 12 %M   POC Granulocyte 6.5 2 - 6.9   Granulocyte percent 64.8 37 - 80 %G   RBC 4.99 4.04 - 5.48 M/uL   Hemoglobin 11.2 (A) 12.2 - 16.2 g/dL   HCT, POC 35.3 (A) 37.7 - 47.9 %   MCV 70.6 (A) 80 - 97 fL   MCH, POC 22.4 (A) 27 - 31.2 pg   MCHC 31.7 (A) 31.8 - 35.4 g/dL   RDW, POC 16.7 %   Platelet Count, POC 259 142 - 424 K/uL   MPV 10.5 0 - 99.8 fL  POCT rapid strep A  Result Value Ref Range   Rapid Strep A Screen Negative Negative  POCT glucose (manual entry)  Result Value Ref Range   POC Glucose 115 (A) 70 - 99 mg/dl  POCT glycosylated hemoglobin (Hb A1C)  Result Value Ref Range   Hemoglobin A1C 6.0     EKG/XRAY:   Primary read interpreted by Dr. Everlene Farrier at [facility].no acute disease no infiltrates seen. Heart size  normal.   ASSESSMENT/PLAN: We'll treat with Z-Pak and Tessalon Perles. She states she cannot afford an albuterol inhaler. She will force f recheck 48 hours if not improving.I personally performed the services described in this documentation, which was scribed in my presence. The recorded information  has been reviewed and is accurate. We'll put her blood pressure medicine o She was instructed to force fluids. She stated she would not get her inhaler filled.  She was having a headache she has a history of migraines frequently associated with illness.I personally performed the services described in this documentation, which was scribed in my presence. The recorded information has been reviewed and is accurate.   Gross sideeffects, risk and benefits, and alternatives of medications d/w patient. Patient is aware that all medications have potential sideeffects and we are unable to predict every sideeffect or drug-drug interaction that may occur.  Jodi Queen MD 11/11/2015 8:13 AM

## 2015-11-11 NOTE — Patient Instructions (Signed)

## 2015-11-17 ENCOUNTER — Encounter: Payer: Self-pay | Admitting: Emergency Medicine

## 2015-11-17 ENCOUNTER — Ambulatory Visit (INDEPENDENT_AMBULATORY_CARE_PROVIDER_SITE_OTHER): Payer: 59 | Admitting: Emergency Medicine

## 2015-11-17 VITALS — BP 135/85 | HR 82 | Temp 98.7°F | Resp 16 | Ht 63.0 in | Wt 296.0 lb

## 2015-11-17 DIAGNOSIS — G43001 Migraine without aura, not intractable, with status migrainosus: Secondary | ICD-10-CM | POA: Diagnosis not present

## 2015-11-17 DIAGNOSIS — R059 Cough, unspecified: Secondary | ICD-10-CM

## 2015-11-17 DIAGNOSIS — R05 Cough: Secondary | ICD-10-CM

## 2015-11-17 DIAGNOSIS — I10 Essential (primary) hypertension: Secondary | ICD-10-CM | POA: Diagnosis not present

## 2015-11-17 NOTE — Progress Notes (Addendum)
Subjective:  This chart was scribed for Jodi Russian, MD by Tamsen Roers, at [facility].  This patient was seen in room 23 and the patient's care was started at 11:05 AM.    Patient ID: [name], female    DOB: 11/06/1971, 45 y.o.   MRN: [id number] Chief Complaint  Patient presents with  . Follow-up  . Neurologic Problem    HPI HPI Comments: [name] is a 45 y.o. female who presents to the [facility] for a follow up. She got a call regarding her test results and MRI which came back normal. (She will have another MRI in 6 months).  Results show that her lesions shrank and her MS looks better. She states that she still has headaches which seem to not go away- feels it mostly behind her eyes.  Patient is still smoking.   Patient states that she is feeling much better after her last visit and came back to work yesterday.    Patient Active Problem List   Diagnosis Date Noted  . Prediabetes   . MS (multiple sclerosis) ([place])   . Obesity, Class III, BMI 40-49.9 (morbid obesity) ([place]) 10/16/2014  . Smoker 10/16/2014  . OSA on CPAP 10/16/2014  . HTN (hypertension) 01/14/2012  . Sickle cell trait ([place]) 01/14/2012   Past Medical History  Diagnosis Date  . Hypertension   . Smoker   . Allergy   . Anemia   . Depression   . Anxiety   . Sickle cell trait ([place])   . Iron overload, transfusional   . Sickle cell anemia (H[facility])     trait  . Clotting disorder ([place])     from beeing anemic  . Migraine   . Tachycardia    Past Surgical History  Procedure Laterality Date  . Cervical ablation    . Tubal ligation    . Cholecystectomy    . Cesarean section      3 times   Allergies  Allergen Reactions  . Iron Shortness Of Breath    IV only   . Delsym [Dextromethorphan Polistirex Er] Other (See Comments)    nightmares  . Hydrocodone-Guaifenesin Other (See Comments)    nightmares  . Doxycycline Other (See Comments)    Bad taste  to patient   Prior to Admission medications   Medication Sig Start Date End Date Taking? Authorizing Provider  aspirin 81 MG tablet Take 81 mg by mouth daily.    Historical Provider, MD  benzonatate (TESSALON) 100 MG capsule Take 1-2 capsules (100-200 mg total) by mouth 3 (three) times daily as needed for cough. 11/11/15   Jodi Russian, MD  ergocalciferol (DRISDOL) 50000 UNITS capsule Take 1 capsule (50,000 Units total) by mouth once a week. 04/21/15 04/20/16  Jodi Russian, MD  Natalizumab (TYSABRI IV) Inject 300 mg into the vein every 30 (thirty) days.    Historical Provider, MD  telmisartan-hydrochlorothiazide (MICARDIS HCT) 80-12.5 MG per tablet 1 tab po qd 11/26/14   Jodi Russian, MD   Social History   Social History  . Marital Status: Single    Spouse Name: [name]  . Number of Children: 3  . 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 Smoking status: Current Every Day Smoker -- 0.25 packs/day for 25 years    Types: Cigarettes    Last Attempt  to Quit: 10/16/2014  . Smokeless tobacco: Never Used     Comment: smoke 5 cigarettes/day or prn  . Alcohol Use: 0.0 oz/week    0 Standard drinks or equivalent per week     Comment: once maybe a month if that  . Drug Use: No  . Sexual Activity:    Partners: Male    Birth Control/ Protection: Condom   Other Topics Concern  . Not on file   Social History Narrative   Patient lives at home with her family.   Caffeine Use: 1 cup daily       Review of Systems  Constitutional: Negative for fever and chills.  Eyes: Negative for redness, itching and visual disturbance.  Respiratory: Negative for cough and shortness of breath.   Gastrointestinal: Negative for nausea and vomiting.  Musculoskeletal: Negative for neck pain and neck stiffness.  Skin: Negative for color change.  Neurological: Positive for headaches. Negative for syncope and speech difficulty.       Objective:    Physical Exam   Filed Vitals:   11/17/15 1055  BP: 135/85  Pulse: 82  Temp: 98.7 F (37.1 C)  Resp: 16  Height: 5\' 3"  (1.6 m)  Weight: 296 lb (134.265 kg)     CONSTITUTIONAL: Well developed/well nourished, Cooperative HEAD: Normocephalic/atraumatic EYES: EOMI/PERRL SPINE/BACK:entire spine nontender CV: S1/S2 noted, no murmurs/rubs/gallops noted LUNGS: Lungs are clear to auscultation bilaterally, no apparent distress NEURO: Pt is awake/alert/appropriate, moves all extremitiesx4.  No facial droop.   EXTREMITIES: pulses normal/equal, full ROM SKIN: warm, color normal PSYCH: no abnormalities of mood noted, alert and oriented to situation\ Nuerological: unchanged       Assessment & Plan:  Patient much improved from her last visit. Her recent MRI is significantly better with decrease in size of the MS lesions. She has recovered from her respiratory illness. She was again advised to stop smoking. She was advised to discuss the possible use of Topamax or Depakote for prophylaxis against migraine headaches with her neurologist.I personally performed the services described in this documentation, which was scribed in my presence. The recorded information has been reviewed and is accurate.Nena Jordan MD last hemoglobin A1c was 6.0.

## 2015-11-27 ENCOUNTER — Other Ambulatory Visit: Payer: Self-pay

## 2015-11-27 ENCOUNTER — Telehealth: Payer: Self-pay

## 2015-11-27 NOTE — Patient Outreach (Signed)
[facility]) Care Management  11/27/2015  KRIS NIU 07-03-1971 [id number]  Member contacted [employer] to inform that she no longer wishes to be in the [employer] to Wellness program.  Case closed as member has withdrawn from the program. Peter Garter RN, [facility] Care Management Coordinator-Link to [place] Management [phone]

## 2015-12-02 DIAGNOSIS — G35 Multiple sclerosis: Secondary | ICD-10-CM | POA: Diagnosis not present

## 2015-12-09 ENCOUNTER — Ambulatory Visit: Payer: 59 | Admitting: Diagnostic Neuroimaging

## 2015-12-10 ENCOUNTER — Encounter: Payer: Self-pay | Admitting: Diagnostic Neuroimaging

## 2015-12-11 DIAGNOSIS — G4733 Obstructive sleep apnea (adult) (pediatric): Secondary | ICD-10-CM | POA: Diagnosis not present

## 2015-12-30 DIAGNOSIS — G35 Multiple sclerosis: Secondary | ICD-10-CM | POA: Diagnosis not present

## 2016-01-01 ENCOUNTER — Telehealth: Payer: Self-pay

## 2016-01-01 DIAGNOSIS — I10 Essential (primary) hypertension: Secondary | ICD-10-CM

## 2016-01-01 MED ORDER — TELMISARTAN-HCTZ 80-12.5 MG PO TABS: ORAL_TABLET | ORAL | Status: DC

## 2016-01-01 NOTE — Telephone Encounter (Signed)
Pt called needing refill on telmisartan-hydrochlorothiazide. I will send in 30 day supply and pt will have Dr. Everlene Farrier refill the rest when he returns.

## 2016-01-04 ENCOUNTER — Telehealth: Payer: Self-pay

## 2016-01-04 NOTE — Telephone Encounter (Signed)
Patient needs FMLA filled out and faxed back to [employer]. I have filled out what I could from her old FMLA forms but some of the things I was not sure about. I have highlighted what needs to be completed, please fill out and place in the FMLA/Disability box at the 102 check out desk with in 5-7 business days.  i will place in Dr Perfecto Kingdom box on 01/04/16.

## 2016-01-05 NOTE — Telephone Encounter (Signed)
I will work on forms today.

## 2016-01-22 ENCOUNTER — Ambulatory Visit (INDEPENDENT_AMBULATORY_CARE_PROVIDER_SITE_OTHER): Payer: 59 | Admitting: Diagnostic Neuroimaging

## 2016-01-22 ENCOUNTER — Encounter: Payer: Self-pay | Admitting: Diagnostic Neuroimaging

## 2016-01-22 VITALS — BP 117/81 | HR 85 | Ht 63.0 in | Wt 295.2 lb

## 2016-01-22 DIAGNOSIS — G35 Multiple sclerosis: Secondary | ICD-10-CM | POA: Diagnosis not present

## 2016-01-22 DIAGNOSIS — R5383 Other fatigue: Secondary | ICD-10-CM | POA: Diagnosis not present

## 2016-01-22 DIAGNOSIS — M2550 Pain in unspecified joint: Secondary | ICD-10-CM

## 2016-01-22 NOTE — Progress Notes (Signed)
[facility]  PATIENT: [name] DOB: [birth year]  REFERRING CLINICIAN:  HISTORY FROM: patient and daughter REASON FOR VISIT: follow up   HISTORICAL  CHIEF COMPLAINT:  Chief Complaint  Patient presents with  . Multiple Sclerosis    rm 7, Tysabri, MRI 06/2015, JCV neg- 08/2015 , dgtr- [place], "some pain on top of right foot-feels better with pressure of shoe on, night vision difficult due to glare  . Follow-up    3 month    HISTORY OF PRESENT ILLNESS:   UPDATE 01/22/16: Since last visit, doing well. Some fatigue on busy work days (Mon and Fri). No new neuro sxs. Tolerating tysabri. Some joint aches pains in arms and wrists.   UPDATE 09/07/15: Since last visit, doing well. Tolerating tysabri. No   ew events neurologically. Some more leg swelling lately, esp with sitting for long time at work. 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  UPDATE 06/02/15: Doing well. Strength good. No numbness. Fatigue better. Concentration is better. Intermittent word finding diff (scanning speech). Tolerating tysabri infusions. 3 weeks ago noted fine small bumps on arms, mild itching. Some int headaches in temporal regions, mild.   UPDATE 03/03/15: Since last visit, more right hip and low back pain. Intermittent. Had tysabri on March 14 and Apr 11. Overall c/o fatigue, focusing issues. 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  UPDATE 12/01/14: Since last visit, left side weakness has improved. Now having more problems with right knee pain, esp walking longer distances. Awaiting tysabri scheduling.   PRIOR HPI (10/29/14): 45 year old right-handed female here for evaluation of multiple sclerosis. 10/15/14 patient developed staggering to the left side. The next day she noted left arm and left leg heaviness and weakness. Patient was in the hospital and had evaluation. MRI of the brain demonstrated multiple brain lesions  suspicious for demyelinating disease. Patient was treated with IV steroids and her left sided symptoms have almost completely resolved. Patient continues to have a slight limp with left leg difficulty. Also has some slight incoordination with left hand. In the past patient has had intermittent headaches. Patient has also had intermittent episodes of knee pain and intermittent vertigo attacks. Patient has family history of multiple sclerosis in maternal uncle and maternal cousin.    REVIEW OF SYSTEMS: Full 14 system review of systems performed and notable only as per HPI.   ALLERGIES: Allergies  Allergen Reactions  . Iron Shortness Of Breath    IV only   . Delsym [Dextromethorphan Polistirex Er] Other (See Comments)    nightmares  . Hydrocodone-Guaifenesin Other (See Comments)    nightmares  . Doxycycline Other (See Comments)    Bad taste to patient    HOME MEDICATIONS: Outpatient Prescriptions Prior to Visit  Medication Sig Dispense Refill  . aspirin 81 MG tablet Take 81 mg by mouth daily.    . ergocalciferol (DRISDOL) 50000 UNITS capsule Take 1 capsule (50,000 Units total) by mouth once a week. 12 capsule 3  . Natalizumab (TYSABRI IV) Inject 300 mg into the vein every 30 (thirty) days.    Marland Kitchen telmisartan-hydrochlorothiazide (MICARDIS HCT) 80-12.5 MG tablet 1 tab po qd 30 tablet 0  . benzonatate (TESSALON) 100 MG capsule Take 1-2 capsules (100-200 mg total) by mouth 3 (three) times daily as needed for cough. 40 capsule 0   No facility-administered medications prior to visit.    PAST MEDICAL HISTORY: Past Medical History  Diagnosis Date  . Hypertension   . Smoker   . Allergy   . Anemia   . Depression   . Anxiety   . Sickle cell trait ([place])   . Iron overload, transfusional   . Sickle cell anemia ([facility])     trait  . Clotting disorder ([place])     from beeing anemic  . Migraine   . Tachycardia   . OSA on CPAP     PAST SURGICAL HISTORY: Past Surgical History  Procedure  Laterality Date  . Cervical ablation    . Tubal ligation    . Cholecystectomy    . Cesarean section      3 times    FAMILY HISTORY: Family History  Problem Relation Age of Onset  . Mental illness Mother   . Hyperlipidemia Mother   . Hyperthyroidism Mother   . Mental retardation Mother   . ADD / ADHD Son   . Diabetes Father   . Cancer Father   . Cancer Paternal Aunt     breast  . Arthritis Maternal Grandmother   . Diabetes Maternal Grandmother   . Hearing loss Maternal Grandmother     left hear when she was a child  . Hyperlipidemia Maternal Grandmother   . Hypertension Maternal Grandmother   . Alcohol abuse Maternal Grandfather   . Early death Maternal Grandfather   . Cancer Paternal Aunt     breast  . Multiple sclerosis Cousin     SOCIAL HISTORY:  Social History   Social History  . Marital Status: Single    Spouse Name: N/A  . Number of Children: 3  . 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 Smoking status: Current Every Day Smoker -- 0.25 packs/day for 25 years    Types: Cigarettes    Last Attempt to Quit: 10/16/2014  . Smokeless tobacco: Never Used     Comment: smoke 5 cigarettes/day or prn  . Alcohol Use: 0.0 oz/week    0 Standard drinks or equivalent per week     Comment: once maybe a month if that  . Drug Use: No  . Sexual Activity:    Partners: Male    Birth Control/ Protection: Condom   Other Topics Concern  . Not on file   Social History Narrative   Patient lives at home with her family.   Caffeine Use: 1 cup daily     PHYSICAL EXAM  Filed Vitals:   01/22/16 0818  BP: 117/81  Pulse: 85  Height: 5\' 3"  (1.6 m)  Weight: 295 lb 3.2 oz (133.902 kg)   Wt Readings from Last 3 Encounters:  01/22/16 295 lb 3.2 oz (133.902 kg)  11/17/15 296 lb (134.265 kg)  09/07/15 298 lb (135.172 kg)   Body mass index is 52.31 kg/(m^2).   Visual Acuity Screening   Right eye Left eye Both eyes   Without correction: 20/30 20/40   With correction:       No flowsheet data found.  GENERAL EXAM: Patient is in no distress; well developed, nourished and groomed; neck is supple; FINE SKIN TONE PAPULES ON ARMS BELOW ELBOWS  CARDIOVASCULAR: Regular rate and rhythm, no murmurs, no carotid bruits  NEUROLOGIC: MENTAL STATUS: awake, alert, language fluent, comprehension intact, naming intact, fund of knowledge appropriate CRANIAL NERVE: pupils equal and reactive to light, visual fields full to confrontation, extraocular muscles intact, no nystagmus, facial sensation  and strength symmetric, hearing intact, palate elevates symmetrically, uvula midline, shoulder shrug symmetric, tongue midline. MOTOR: normal bulk and tone, full strength in the BUE, BLE SENSORY: normal and symmetric to light touch COORDINATION: finger-nose-finger, fine finger movements normal REFLEXES: deep tendon reflexes present and symmetric GAIT/STATION: narrow based gait; STABLE, SMOOTH     DIAGNOSTIC DATA (LABS, IMAGING, TESTING) - I reviewed patient records, labs, notes, testing and imaging myself where available.  Lab Results  Component Value Date   WBC 10.0 11/11/2015   HGB 11.2* 11/11/2015   HCT 35.3* 11/11/2015   MCV 70.6* 11/11/2015   PLT 243 08/10/2015      Component Value Date/Time   NA 141 11/11/2015 0828   NA 140 08/10/2015 0818   K 4.7 11/11/2015 0828   CL 107 11/11/2015 0828   CO2 23 11/11/2015 0828   GLUCOSE 100* 11/11/2015 0828   GLUCOSE 109* 08/10/2015 0818   BUN 9 11/11/2015 0828   BUN 9 08/10/2015 0818   CREATININE 0.75 11/11/2015 0828   CREATININE 0.75 08/10/2015 0818   CALCIUM 10.0 11/11/2015 0828   PROT 7.0 11/11/2015 0828   PROT 6.6 08/10/2015 0818   ALBUMIN 3.9 11/11/2015 0828   ALBUMIN 4.0 08/10/2015 0818   AST 18 11/11/2015 0828   ALT 21 11/11/2015 0828   ALKPHOS 82 11/11/2015 0828   BILITOT 0.2 11/11/2015 0828   BILITOT 0.3 08/10/2015 0818   GFRNONAA >89 11/11/2015  0828   GFRNONAA 97 08/10/2015 0818   GFRAA >89 11/11/2015 0828   GFRAA 111 08/10/2015 0818   Lab Results  Component Value Date   CHOL 166 11/11/2015   HDL 37* 11/11/2015   LDLCALC 105 11/11/2015   TRIG 119 11/11/2015   CHOLHDL 4.5 11/11/2015   Lab Results  Component Value Date   HGBA1C 6.0 11/11/2015   No results found for: [id number] Lab Results  Component Value Date   TSH 0.904 12/12/2013   VIT D, 25-HYDROXY  Date Value Ref Range Status  03/05/2015 20* 30 - 100 ng/mL Final    Comment:    Vitamin D Status           25-OH Vitamin D        Deficiency                <20 ng/mL        Insufficiency         20 - 29 ng/mL        Optimal             > or = 30 ng/mL   For 25-OH Vitamin D testing on patients on D2-supplementation and patients for whom quantitation of D2 and D3 fractions is required, the QuestAssureD 25-OH VIT D, (D2,D3), LC/MS/MS is recommended: order code [id number] (patients > 2 yrs).     10/17/14 MRI brain (without)  1. Multiple T2/FLAIR hyperintense lesions with associated restricted diffusion involving the white matter of both cerebral hemispheres, right greater than left, most consistent with active demyelinating disease. 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 Follow-up examination with postcontrast imaging could be performed for complete evaluation. 2. No other findings to suggest acute intracranial infarct or other process.  10/17/14 MRI cervical spine (with and without)  - Motion degraded exam demonstrating no convincing evidence for spinal MS.  Certainly there are no areas of T2 hyperintensity or cord enlargement. No disc protrusion or spinal stenosis.  10/18/14 MRI brain (with) - Findings consistent with acute demyelination in the RIGHT posterior frontal subcortical white matter in this patient with suspected multiple sclerosis.  Multiple other areas of T2 signal hyperintensity on the previous study do not display similar postcontrast enhancement. 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  07/08/15 MRI brain [I reviewed images myself and agree with interpretation. -[facility]]  1. Regression of multiple sclerosis since December 2015. No active demyelination. 2. Tiny 5 mm right posterior convexity meningioma versus benign dural thickening. This is stable and appears inconsequential.  10/21/14 CSF opening pressure 19cm H2O - (WBC 18, RBC 3545, protein 50, glucose 110, cryptococcal ag negative, IgG index 7.1 (normal), OCB > 5 well defined gamma restriction bands)  10/29/14 JCV ab - negative  08/28/15 JCV ab - negative     ASSESSMENT AND PLAN  45 y.o. 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 Now on tysabri and doing very well.  Dx:  MS (multiple sclerosis) ([place]) - Plan: VITAMIN D 25 Hydroxy (Vit-D Deficiency, Fractures), CBC with Differential/Platelet, Comprehensive metabolic panel, Stratify JCV Antibody Test (Quest)  Other fatigue - Plan: VITAMIN D 25 Hydroxy (Vit-D Deficiency, Fractures), CBC with Differential/Platelet, Comprehensive metabolic panel, Stratify JCV Antibody Test (Quest)  Joint pain    PLAN: - for multiple sclerosis -->  - continue tysabri - continue physical activity, PT exercises - repeat labs today - repeat MRI in Aug 2017 (normally would check now, but will hold off for financial reasons)  - for joint aches/pains --> - consider low dose fish oil capsule (1-2 per day OTC) for joint pains and anti-inflammatory effect - consider occasional ibuprofen as  needed for joint pain - consider yoga or water therapy - reduce sugar / carbohydrate intake  Orders Placed This Encounter  Procedures  . VITAMIN D 25 Hydroxy (Vit-D Deficiency, Fractures)  . CBC with Differential/Platelet  . Comprehensive metabolic panel  . Stratify JCV Antibody Test (Quest)   Return in about 3 months (around 04/23/2016).     Penni Bombard, MD [id number], [id number] AM Certified in Neurology, Neurophysiology and Neuroimaging  [facility] [address] [place], [place] [address] [phone]

## 2016-01-22 NOTE — Patient Instructions (Signed)
Thank you for coming to see [place] at [facility]. I hope we have been able to provide you high quality care today.  You may receive a patient satisfaction survey over the next few weeks. We would appreciate your feedback and comments so that we may continue to improve ourselves and the health of our patients.  - consider low dose fish oil capsule (1-2 per day OTC) for joint pains and anti-inflammatory effect - consider occasional ibuprofen as needed for joint pain - consider yoga or water therapy - reduce sugar / carbohydrate intake    ~~~~~~~~~~~~~~~~~~~~~~~~~~~~~~~~~~~~~~~~~~~~~~~~~~~~~~~~~~~~~~~~~  DR. Seferina Brokaw'S GUIDE TO HAPPY AND HEALTHY LIVING These are some of my general health and wellness recommendations. Some of them may apply to you better than others. Please use common sense as you try these suggestions and feel free to ask me any questions.   ACTIVITY/FITNESS Mental, social, emotional and physical stimulation are very important for brain and body health. Try learning a new activity (arts, music, language, sports, games).  Keep moving your body to the best of your abilities. You can do this at home, inside or outside, the park, community center, gym or anywhere you like. Consider a physical therapist or personal trainer to get started. Consider the app Sworkit. Fitness trackers such as smart-watches, smart-phones or Fitbits can help as well.   NUTRITION Eat more plants: colorful vegetables, nuts, seeds and berries.  Eat less sugar, salt, preservatives and processed foods.  Avoid toxins such as cigarettes and alcohol.  Drink water when you are thirsty. Warm water with a slice of lemon is an excellent morning drink to start the day.  Consider these websites for more information The Nutrition Source (https://www.henry-hernandez.biz/) Precision Nutrition (WindowBlog.ch)   RELAXATION Consider practicing  mindfulness meditation or other relaxation techniques such as deep breathing, prayer, yoga, tai chi, massage. See website mindful.org or the apps Headspace or Calm to help get started.   SLEEP Try to get at least 7-8+ hours sleep per day. Regular exercise and reduced caffeine will help you sleep better. Practice good sleep hygeine techniques. See website sleep.org for more information.   PLANNING Prepare estate planning, living will, healthcare POA documents. Sometimes this is best planned with the help of an attorney. Theconversationproject.org and agingwithdignity.org are excellent resources.

## 2016-01-23 LAB — CBC WITH DIFFERENTIAL/PLATELET
BASOS: 0 %
Basophils Absolute: 0 10*3/uL (ref 0.0–0.2)
EOS (ABSOLUTE): 0.3 10*3/uL (ref 0.0–0.4)
EOS: 4 %
HEMOGLOBIN: 12.2 g/dL (ref 11.1–15.9)
Hematocrit: 39.6 % (ref 34.0–46.6)
IMMATURE GRANS (ABS): 0 10*3/uL (ref 0.0–0.1)
IMMATURE GRANULOCYTES: 0 %
Lymphocytes Absolute: 2.3 10*3/uL (ref 0.7–3.1)
Lymphs: 26 %
MCH: 21.6 pg — ABNORMAL LOW (ref 26.6–33.0)
MCHC: 30.8 g/dL — ABNORMAL LOW (ref 31.5–35.7)
MCV: 70 fL — AB (ref 79–97)
Monocytes Absolute: 0.5 10*3/uL (ref 0.1–0.9)
Monocytes: 6 %
NEUTROS PCT: 64 %
Neutrophils Absolute: 5.5 10*3/uL (ref 1.4–7.0)
Platelets: 247 10*3/uL (ref 150–379)
RBC: 5.65 x10E6/uL — ABNORMAL HIGH (ref 3.77–5.28)
RDW: 19 % — ABNORMAL HIGH (ref 12.3–15.4)
WBC: 8.7 10*3/uL (ref 3.4–10.8)

## 2016-01-23 LAB — VITAMIN D 25 HYDROXY (VIT D DEFICIENCY, FRACTURES): VIT D 25 HYDROXY: 25.3 ng/mL — AB (ref 30.0–100.0)

## 2016-01-23 LAB — COMPREHENSIVE METABOLIC PANEL
A/G RATIO: 1.5 (ref 1.1–2.5)
ALBUMIN: 4.1 g/dL (ref 3.5–5.5)
ALT: 18 IU/L (ref 0–32)
AST: 17 IU/L (ref 0–40)
Alkaline Phosphatase: 88 IU/L (ref 39–117)
BUN/Creatinine Ratio: 10 (ref 9–23)
BUN: 8 mg/dL (ref 6–24)
Bilirubin Total: 0.3 mg/dL (ref 0.0–1.2)
CALCIUM: 10.6 mg/dL — AB (ref 8.7–10.2)
CO2: 23 mmol/L (ref 18–29)
Chloride: 103 mmol/L (ref 96–106)
Creatinine, Ser: 0.77 mg/dL (ref 0.57–1.00)
GFR, EST AFRICAN AMERICAN: 107 mL/min/{1.73_m2} (ref >59)
GFR, EST NON AFRICAN AMERICAN: 93 mL/min/{1.73_m2} (ref >59)
GLOBULIN, TOTAL: 2.7 g/dL (ref 1.5–4.5)
Glucose: 102 mg/dL — ABNORMAL HIGH (ref 65–99)
POTASSIUM: 4.7 mmol/L (ref 3.5–5.2)
SODIUM: 139 mmol/L (ref 134–144)
TOTAL PROTEIN: 6.8 g/dL (ref 6.0–8.5)

## 2016-01-25 ENCOUNTER — Telehealth: Payer: Self-pay | Admitting: *Deleted

## 2016-01-25 NOTE — Telephone Encounter (Signed)
Spoke with patient and informed her that her labs are mildly abnormal bu tot continue current medications. She takes Vit D 50000 units weekly; advised she continue.  She vernbalized understanding, appreciation.

## 2016-01-27 ENCOUNTER — Telehealth: Payer: Self-pay | Admitting: *Deleted

## 2016-01-27 ENCOUNTER — Telehealth: Payer: Self-pay | Admitting: Diagnostic Neuroimaging

## 2016-01-27 DIAGNOSIS — G35 Multiple sclerosis: Secondary | ICD-10-CM | POA: Diagnosis not present

## 2016-01-27 NOTE — Telephone Encounter (Signed)
Per Dr Leta Baptist, spoke with patient and informed her that her JCV results are positive. Informed her that this is nothing to be immediately concerned about, but she can schedule earlier FU or Dr Leta Baptist will speak with her at her next infusion apt.  She stated she would speak with him at her next infusion in one month. She verbalized understanding, appreciation.

## 2016-01-27 NOTE — Telephone Encounter (Signed)
LVM requesting call back re: PA for tysabri.

## 2016-01-27 NOTE — Telephone Encounter (Signed)
[employer] called to say that the authorization has ended for Tysabri . May call [phone] ext: [phone]

## 2016-02-01 ENCOUNTER — Encounter: Payer: Self-pay | Admitting: *Deleted

## 2016-02-02 NOTE — Telephone Encounter (Signed)
Spoke with Moji, [employer] who stated they received the information they needed re: Tysabri. She verbalized appreciation for call back.

## 2016-02-10 ENCOUNTER — Encounter: Payer: Self-pay | Admitting: Family Medicine

## 2016-02-10 ENCOUNTER — Ambulatory Visit (INDEPENDENT_AMBULATORY_CARE_PROVIDER_SITE_OTHER): Payer: 59 | Admitting: Family Medicine

## 2016-02-10 VITALS — BP 123/81 | HR 100 | Temp 98.7°F | Resp 20

## 2016-02-10 DIAGNOSIS — G35 Multiple sclerosis: Secondary | ICD-10-CM

## 2016-02-10 DIAGNOSIS — J111 Influenza due to unidentified influenza virus with other respiratory manifestations: Secondary | ICD-10-CM

## 2016-02-10 LAB — POCT CBC
GRANULOCYTE PERCENT: 67.5 % (ref 37–80)
HCT, POC: 35.7 % — AB (ref 37.7–47.9)
HEMOGLOBIN: 12 g/dL — AB (ref 12.2–16.2)
Lymph, poc: 3.1 (ref 0.6–3.4)
MCH, POC: 23 pg — AB (ref 27–31.2)
MCHC: 33.5 g/dL (ref 31.8–35.4)
MCV: 68.7 fL — AB (ref 80–97)
MID (cbc): 0.3 (ref 0–0.9)
MPV: 11.3 fL (ref 0–99.8)
PLATELET COUNT, POC: 263 10*3/uL (ref 142–424)
POC Granulocyte: 7 — AB (ref 2–6.9)
POC LYMPH %: 29.6 % (ref 10–50)
POC MID %: 2.9 %M (ref 0–12)
RBC: 5.2 M/uL (ref 4.04–5.48)
RDW, POC: 19.1 %
WBC: 10.4 10*3/uL — AB (ref 4.6–10.2)

## 2016-02-10 LAB — POCT INFLUENZA A/B
INFLUENZA A, POC: NEGATIVE
INFLUENZA B, POC: NEGATIVE

## 2016-02-10 MED ORDER — PSEUDOEPHEDRINE HCL ER 120 MG PO TB12: 120.0000 mg | ORAL_TABLET | Freq: Two times a day (BID) | ORAL | Status: DC | PRN

## 2016-02-10 MED ORDER — OSELTAMIVIR PHOSPHATE 75 MG PO CAPS: 75.0000 mg | ORAL_CAPSULE | Freq: Two times a day (BID) | ORAL | Status: DC

## 2016-02-10 NOTE — Patient Instructions (Signed)

## 2016-02-10 NOTE — Progress Notes (Signed)
Subjective:    Patient ID: [name], female    DOB: October 13, 1971, 45 y.o.   MRN: [id number]  02/10/2016  Influenza   HPI This 45 y.o. female presents for evaluation of cough, sneezing, flu like symptoms.  Two days ago, started feeling funny; +nasal congestion.  +HA occipital. Checked temperature 98.7.  Staying normal since.  +nasal congestion; +chills; slight headache; irritated throat with onset; +fatigued.  No work yesterday.  Felt better last night so planned to work today.  +coughing a lot; no SOB. +wheezing mild.  +sputum clear; +nasal discharge clear.  No v/d.  Taking Nyquil allergy flu cold with improvement.   S/p flu vaccine.    Stratify JCV antibody Ilisa + on 01/22/2016   Review of Systems  Constitutional: Negative for fever, chills, diaphoresis and fatigue.  Eyes: Negative for visual disturbance.  Respiratory: Negative for cough and shortness of breath.   Cardiovascular: Negative for chest pain, palpitations and leg swelling.  Gastrointestinal: Negative for nausea, vomiting, abdominal pain, diarrhea and constipation.  Endocrine: Negative for cold intolerance, heat intolerance, polydipsia, polyphagia and polyuria.  Neurological: Negative for dizziness, tremors, seizures, syncope, facial asymmetry, speech difficulty, weakness, light-headedness, numbness and headaches.    Past Medical History  Diagnosis Date  . Hypertension   . Smoker   . Allergy   . Anemia   . Depression   . Anxiety   . Sickle cell trait ([place])   . Iron overload, transfusional   . Sickle cell anemia (HCC)     trait  . Clotting disorder ([place])     from beeing anemic  . Migraine   . Tachycardia   . OSA on CPAP    Past Surgical History  Procedure Laterality Date  . Cervical ablation    . Tubal ligation    . Cholecystectomy    . Cesarean section      3 times   Allergies  Allergen Reactions  . Iron Shortness Of Breath    IV only   . Delsym [Dextromethorphan Polistirex Er] Other (See  Comments)    nightmares  . Hydrocodone-Guaifenesin Other (See Comments)    nightmares  . Doxycycline Other (See Comments)    Bad taste to patient   Current Outpatient Prescriptions  Medication Sig Dispense Refill  . aspirin 81 MG tablet Take 81 mg by mouth daily.    . ergocalciferol (DRISDOL) 50000 UNITS capsule Take 1 capsule (50,000 Units total) by mouth once a week. 12 capsule 3  . Natalizumab (TYSABRI IV) Inject 300 mg into the vein every 30 (thirty) days.    Marland Kitchen telmisartan-hydrochlorothiazide (MICARDIS HCT) 80-12.5 MG tablet 1 tab po qd 30 tablet 0  . oseltamivir (TAMIFLU) 75 MG capsule Take 1 capsule (75 mg total) by mouth 2 (two) times daily. 10 capsule 0  . pseudoephedrine (SUDAFED) 120 MG 12 hr tablet Take 1 tablet (120 mg total) by mouth every 12 (twelve) hours as needed for congestion. 20 tablet 0   No current facility-administered medications for this visit.   Social History   Social History  . Marital Status: Single    Spouse Name: N/A  . Number of Children: 3  . 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 Smoking status: Current Every Day Smoker -- 0.25 packs/day for 25 years    Types: Cigarettes    Last Attempt to Quit: 10/16/2014  . Smokeless tobacco: Never Used  Comment: smoke 5 cigarettes/day or prn  . Alcohol Use: 0.0 oz/week    0 Standard drinks or equivalent per week     Comment: once maybe a month if that  . Drug Use: No  . Sexual Activity:    Partners: Male    Birth Control/ Protection: Condom   Other Topics Concern  . Not on file   Social History Narrative   Patient lives at home with her family.   Caffeine Use: 1 cup daily   Family History  Problem Relation Age of Onset  . Mental illness Mother   . Hyperlipidemia Mother   . Hyperthyroidism Mother   . Mental retardation Mother   . ADD / ADHD Son   . Diabetes Father   . Cancer Father   . Cancer Paternal Aunt     breast  .  Arthritis Maternal Grandmother   . Diabetes Maternal Grandmother   . Hearing loss Maternal Grandmother     left hear when she was a child  . Hyperlipidemia Maternal Grandmother   . Hypertension Maternal Grandmother   . Alcohol abuse Maternal Grandfather   . Early death Maternal Grandfather   . Cancer Paternal Aunt     breast  . Multiple sclerosis Cousin        Objective:    BP 123/81 mmHg  Pulse 100  Temp(Src) 98.7 F (37.1 C) (Oral)  Resp 20  SpO2 98% Physical Exam  Constitutional: She is oriented to person, place, and time. She appears well-developed and well-nourished. No distress.  HENT:  Head: Normocephalic and atraumatic.  Right Ear: External ear normal.  Left Ear: External ear normal.  Nose: Nose normal.  Mouth/Throat: Oropharynx is clear and moist.  Eyes: Conjunctivae and EOM are normal. Pupils are equal, round, and reactive to light.  Neck: Normal range of motion. Neck supple. Carotid bruit is not present. No thyromegaly present.  Cardiovascular: Normal rate, regular rhythm, normal heart sounds and intact distal pulses.  Exam reveals no gallop and no friction rub.   No murmur heard. Pulmonary/Chest: Effort normal and breath sounds normal. She has no wheezes. She has no rales.  Abdominal: Soft. Bowel sounds are normal. She exhibits no distension and no mass. There is no tenderness. There is no rebound and no guarding.  Lymphadenopathy:    She has no cervical adenopathy.  Neurological: She is alert and oriented to person, place, and time. No cranial nerve deficit.  Skin: Skin is warm and dry. No rash noted. She is not diaphoretic. No erythema. No pallor.  Psychiatric: She has a normal mood and affect. Her behavior is normal.   Results for orders placed or performed in visit on 02/10/16  POCT CBC  Result Value Ref Range   WBC 10.4 (A) 4.6 - 10.2 K/uL   Lymph, poc 3.1 0.6 - 3.4   POC LYMPH PERCENT 29.6 10 - 50 %L   MID (cbc) 0.3 0 - 0.9   POC MID % 2.9 0 - 12 %M     POC Granulocyte 7.0 (A) 2 - 6.9   Granulocyte percent 67.5 37 - 80 %G   RBC 5.20 4.04 - 5.48 M/uL   Hemoglobin 12.0 (A) 12.2 - 16.2 g/dL   HCT, POC 35.7 (A) 37.7 - 47.9 %   MCV 68.7 (A) 80 - 97 fL   MCH, POC 23.0 (A) 27 - 31.2 pg   MCHC 33.5 31.8 - 35.4 g/dL   RDW, POC 19.1 %   Platelet Count, POC 263  142 - 424 K/uL   MPV 11.3 0 - 99.8 fL  POCT Influenza A/B  Result Value Ref Range   Influenza A, POC Negative Negative   Influenza B, POC Negative Negative       Assessment & Plan:   1. Influenza   2. MS (multiple sclerosis) ([place])     Orders Placed This Encounter  Procedures  . POCT CBC  . POCT Influenza A/B   Meds ordered this encounter  Medications  . DISCONTD: oseltamivir (TAMIFLU) 75 MG capsule    Sig: Take 1 capsule (75 mg total) by mouth 2 (two) times daily.    Dispense:  10 capsule    Refill:  0  . DISCONTD: pseudoephedrine (SUDAFED) 120 MG 12 hr tablet    Sig: Take 1 tablet (120 mg total) by mouth every 12 (twelve) hours as needed for congestion.    Dispense:  20 tablet    Refill:  0  . oseltamivir (TAMIFLU) 75 MG capsule    Sig: Take 1 capsule (75 mg total) by mouth 2 (two) times daily.    Dispense:  10 capsule    Refill:  0  . pseudoephedrine (SUDAFED) 120 MG 12 hr tablet    Sig: Take 1 tablet (120 mg total) by mouth every 12 (twelve) hours as needed for congestion.    Dispense:  20 tablet    Refill:  0    No Follow-up on file.    Tyiesha Brackney Elayne Guerin, M.D. [facility] [address] [place], [place]  [id number] [phone] phone [phone] fax

## 2016-03-02 DIAGNOSIS — G35 Multiple sclerosis: Secondary | ICD-10-CM | POA: Diagnosis not present

## 2016-03-08 ENCOUNTER — Ambulatory Visit (INDEPENDENT_AMBULATORY_CARE_PROVIDER_SITE_OTHER): Payer: 59 | Admitting: Emergency Medicine

## 2016-03-08 VITALS — BP 121/84 | HR 85 | Temp 98.4°F | Resp 16

## 2016-03-08 DIAGNOSIS — G4489 Other headache syndrome: Secondary | ICD-10-CM | POA: Diagnosis not present

## 2016-03-08 DIAGNOSIS — G35 Multiple sclerosis: Secondary | ICD-10-CM

## 2016-03-08 MED ORDER — KETOROLAC TROMETHAMINE 60 MG/2ML IM SOLN
60.0000 mg | Freq: Once | INTRAMUSCULAR | Status: AC
  Administered 2016-03-08: 60 mg via INTRAMUSCULAR

## 2016-03-08 NOTE — Progress Notes (Signed)
[name]  MRN: [id number] DOB: 1971-02-03  Subjective:  Pt presents to clinic with body aches that she gets from her MS flairs and esp with the weather and the last 4 days of rain.  Her headache is middle of head with som emild photophobia but no nausea.  She has tried motrin 800mg  without any relief. All of her joints hurt which is normal for her with her MS and she does not want to go on prednisone currently - she knows that when the weather gets better she will get better.  Headache - has one daily but then when her MS gets worse the HA which she is currently dealing with  Last MS treatment - last week - and she knows that helps her pain a lot  Patient Active Problem List   Diagnosis Date Noted  . Prediabetes   . MS (multiple sclerosis) ([place])   . Obesity, Class III, BMI 40-49.9 (morbid obesity) ([place]) 10/16/2014  . Smoker 10/16/2014  . OSA on CPAP 10/16/2014  . HTN (hypertension) 01/14/2012  . Sickle cell trait ([place]) 01/14/2012    Current Outpatient Prescriptions on File Prior to Visit  Medication Sig Dispense Refill  . aspirin 81 MG tablet Take 81 mg by mouth daily.    . ergocalciferol (DRISDOL) 50000 UNITS capsule Take 1 capsule (50,000 Units total) by mouth once a week. 12 capsule 3  . Natalizumab (TYSABRI IV) Inject 300 mg into the vein every 30 (thirty) days.    Marland Kitchen telmisartan-hydrochlorothiazide (MICARDIS HCT) 80-12.5 MG tablet 1 tab po qd 30 tablet 0   No current facility-administered medications on file prior to visit.    Allergies  Allergen Reactions  . Iron Shortness Of Breath    IV only   . Delsym [Dextromethorphan Polistirex Er] Other (See Comments)    nightmares  . Hydrocodone-Guaifenesin Other (See Comments)    nightmares  . Doxycycline Other (See Comments)    Bad taste to patient    Review of Systems  Constitutional: Negative for fever and chills.  Eyes: Positive for photophobia (mild).  Neurological: Positive for headaches. Negative for  weakness.   Objective:  BP 121/84 mmHg  Pulse 85  Temp(Src) 98.4 F (36.9 C) (Oral)  Resp 16  Ht   Wt   SpO2 98%  LMP 03/01/2016  Physical Exam  Constitutional: She is oriented to person, place, and time and well-developed, well-nourished, and in no distress.  HENT:  Head: Normocephalic and atraumatic.  Right Ear: Hearing and external ear normal.  Left Ear: Hearing and external ear normal.  Eyes: Conjunctivae, EOM and lids are normal. Pupils are equal, round, and reactive to light.  No photophobia  Neck: Normal range of motion.  Cardiovascular: Normal rate, regular rhythm and normal heart sounds.   No murmur heard. Pulmonary/Chest: Effort normal and breath sounds normal. She has no wheezes.  Neurological: She is alert and oriented to person, place, and time. She has normal sensation, normal strength, normal reflexes and intact cranial nerves. She displays no weakness, facial symmetry and normal reflexes. No sensory deficit. Gait (normal for patient) abnormal. Coordination normal.  Skin: Skin is warm and dry.  Psychiatric: Mood, memory, affect and judgment normal.  Vitals reviewed.   Assessment and Plan :  Other headache syndrome - Plan: ketorolac (TORADOL) injection 60 mg  MS (multiple sclerosis) (H[facility])   Pt declines prednisone for her MS related pain - we will treat her pain - we may want to consider giving her an  Ultram Rx for at home in the future.  Windell Hummingbird PA-C  [facility] 03/08/2016 10:36 AM

## 2016-03-12 ENCOUNTER — Ambulatory Visit (INDEPENDENT_AMBULATORY_CARE_PROVIDER_SITE_OTHER): Payer: 59 | Admitting: Family Medicine

## 2016-03-12 VITALS — BP 138/105 | HR 98 | Temp 98.0°F | Resp 20 | Ht 63.0 in

## 2016-03-12 DIAGNOSIS — L918 Other hypertrophic disorders of the skin: Secondary | ICD-10-CM | POA: Diagnosis not present

## 2016-03-12 NOTE — Progress Notes (Signed)
Subjective:  By signing my name below, I, [name], attest that this documentation has been prepared under the direction and in the presence of Delman Cheadle, MD.  Electronically Signed: Thea Alken, ED Scribe. 03/12/2016. 11:22 AM.   Patient ID: [name], female    DOB: 1971-04-17, 46 y.o.   MRN: [id number]  HPI Chief Complaint  Patient presents with  . Nevus   HPI Comments: [name] is a 45 y.o. female who presents to the [facility] complaining of nevi to left neck. She reports some irritation to nevi especially with wearing jewelry. Pt has had nevi removed in the past but states they have grown back. Pt would like them removed today.   Patient Active Problem List   Diagnosis Date Noted  . Prediabetes   . MS (multiple sclerosis) ([place])   . Obesity, Class III, BMI 40-49.9 (morbid obesity) ([place]) 10/16/2014  . Smoker 10/16/2014  . OSA on CPAP 10/16/2014  . HTN (hypertension) 01/14/2012  . Sickle cell trait ([place]) 01/14/2012   Past Medical History  Diagnosis Date  . Hypertension   . Smoker   . Allergy   . Anemia   . Depression   . Anxiety   . Sickle cell trait ([place])   . Iron overload, transfusional   . Sickle cell anemia (H[facility])     trait  . Clotting disorder ([place])     from beeing anemic  . Migraine   . Tachycardia   . OSA on CPAP    Past Surgical History  Procedure Laterality Date  . Cervical ablation    . Tubal ligation    . Cholecystectomy    . Cesarean section      3 times   Allergies  Allergen Reactions  . Iron Shortness Of Breath    IV only   . Delsym [Dextromethorphan Polistirex Er] Other (See Comments)    nightmares  . Hydrocodone-Guaifenesin Other (See Comments)    nightmares  . Doxycycline Other (See Comments)    Bad taste to patient   Prior to Admission medications   Medication Sig Start Date End Date Taking? Authorizing Provider  aspirin 81 MG tablet Take 81 mg by mouth daily.   Yes Historical Provider, MD    ergocalciferol (DRISDOL) 50000 UNITS capsule Take 1 capsule (50,000 Units total) by mouth once a week. 04/21/15 04/20/16 Yes [name], MD  Natalizumab (TYSABRI IV) Inject 300 mg into the vein every 30 (thirty) days.   Yes Historical Provider, MD  telmisartan-hydrochlorothiazide (MICARDIS HCT) 80-12.5 MG tablet 1 tab po qd 01/01/16  Yes [name], MD   Social History   Social History  . Marital Status: Single    Spouse Name: N/A  . Number of Children: 3  . 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 Smoking status: Current Every Day Smoker -- 0.25 packs/day for 25 years    Types: Cigarettes    Last Attempt to Quit: 10/16/2014  . Smokeless tobacco: Never Used     Comment: smoke 5 cigarettes/day or prn  . Alcohol Use: 0.0 oz/week    0 Standard drinks or equivalent per week     Comment: once maybe a month if that  . Drug Use: No  . Sexual Activity:    Partners: Male    Birth Control/ Protection: Condom   Other Topics Concern  .  Not on file   Social History Narrative   Patient lives at home with her family.   Caffeine Use: 1 cup daily   Review of Systems  Musculoskeletal: Negative for neck pain and neck stiffness.  Skin: Negative for color change, rash and wound.  Hematological: Negative for adenopathy. Does not bruise/bleed easily.    Objective:   Physical Exam  Constitutional: She is oriented to person, place, and time. She appears well-developed and well-nourished. No distress.  HENT:  Head: Normocephalic and atraumatic.  Eyes: Conjunctivae and EOM are normal.  Neck: Neck supple.  Cardiovascular: Normal rate.   Pulmonary/Chest: Effort normal.  Musculoskeletal: Normal range of motion.  Neurological: She is alert and oriented to person, place, and time.  Skin: Skin is warm and dry.  5 49mm hyperpigmented skin tag over left neck and 1 on right. No erythema friability.    Psychiatric: She has a normal mood and  affect. Her behavior is normal.  Nursing note and vitals reviewed.   Filed Vitals:   03/12/16 1106  BP: 138/105  Pulse: 98  Temp: 98 F (36.7 C)  TempSrc: Oral  Resp: 20  Height: 5\' 3"  (1.6 m)  SpO2: 98%   Procedure. Skin tag cleansed with alcohol and removed with scissors without complications. Pin point of blood from one site on left neck.  EDL microscopic. No complication.    Assessment & Plan:   1. Skin tag      I personally performed the services described in this documentation, which was scribed in my presence. The recorded information has been reviewed and considered, and addended by me as needed.  Delman Cheadle, MD MPH

## 2016-03-12 NOTE — Patient Instructions (Signed)
     IF you received an x-ray today, you will receive an invoice from [facility]. Please contact [facility] at [phone] with questions or concerns regarding your invoice.   IF you received labwork today, you will receive an invoice from [facility]. Please contact [facility] at [phone] with questions or concerns regarding your invoice.   Our billing staff will not be able to assist you with questions regarding bills from these companies.  You will be contacted with the lab results as soon as they are available. The fastest way to get your results is to activate your My Chart account. Instructions are located on the last page of this paperwork. If you have not heard from us regarding the results in 2 weeks, please contact this office.      

## 2016-03-28 ENCOUNTER — Ambulatory Visit (INDEPENDENT_AMBULATORY_CARE_PROVIDER_SITE_OTHER): Payer: 59 | Admitting: Emergency Medicine

## 2016-03-28 VITALS — BP 130/82 | HR 83 | Temp 98.1°F | Resp 18 | Ht 63.25 in

## 2016-03-28 DIAGNOSIS — R05 Cough: Secondary | ICD-10-CM | POA: Diagnosis not present

## 2016-03-28 DIAGNOSIS — J209 Acute bronchitis, unspecified: Secondary | ICD-10-CM | POA: Diagnosis not present

## 2016-03-28 DIAGNOSIS — B373 Candidiasis of vulva and vagina: Secondary | ICD-10-CM | POA: Diagnosis not present

## 2016-03-28 DIAGNOSIS — B3731 Acute candidiasis of vulva and vagina: Secondary | ICD-10-CM

## 2016-03-28 DIAGNOSIS — J029 Acute pharyngitis, unspecified: Secondary | ICD-10-CM

## 2016-03-28 DIAGNOSIS — R300 Dysuria: Secondary | ICD-10-CM | POA: Diagnosis not present

## 2016-03-28 DIAGNOSIS — R059 Cough, unspecified: Secondary | ICD-10-CM

## 2016-03-28 DIAGNOSIS — R062 Wheezing: Secondary | ICD-10-CM

## 2016-03-28 LAB — POCT CBC
GRANULOCYTE PERCENT: 64.5 % (ref 37–80)
HEMATOCRIT: 35.2 % — AB (ref 37.7–47.9)
Hemoglobin: 11.5 g/dL — AB (ref 12.2–16.2)
LYMPH, POC: 2.7 (ref 0.6–3.4)
MCH, POC: 22.7 pg — AB (ref 27–31.2)
MCHC: 32.8 g/dL (ref 31.8–35.4)
MCV: 69.2 fL — AB (ref 80–97)
MID (CBC): 0.7 (ref 0–0.9)
MPV: 10.7 fL (ref 0–99.8)
PLATELET COUNT, POC: 208 10*3/uL (ref 142–424)
POC GRANULOCYTE: 6.1 (ref 2–6.9)
POC LYMPH %: 28.5 % (ref 10–50)
POC MID %: 7 %M (ref 0–12)
RBC: 5.08 M/uL (ref 4.04–5.48)
RDW, POC: 18.4 %
WBC: 9.4 10*3/uL (ref 4.6–10.2)

## 2016-03-28 LAB — POCT WET + KOH PREP
TRICH BY WET PREP: ABSENT
Yeast by wet prep: ABSENT

## 2016-03-28 LAB — POCT INFLUENZA A/B
INFLUENZA A, POC: NEGATIVE
Influenza B, POC: NEGATIVE

## 2016-03-28 LAB — POCT URINALYSIS DIP (MANUAL ENTRY)
Bilirubin, UA: NEGATIVE
GLUCOSE UA: NEGATIVE
Ketones, POC UA: NEGATIVE
Leukocytes, UA: NEGATIVE
Nitrite, UA: NEGATIVE
PROTEIN UA: NEGATIVE
SPEC GRAV UA: 1.02
UROBILINOGEN UA: 0.2
pH, UA: 5.5

## 2016-03-28 LAB — POC MICROSCOPIC URINALYSIS (UMFC)

## 2016-03-28 LAB — POCT RAPID STREP A (OFFICE): RAPID STREP A SCREEN: NEGATIVE

## 2016-03-28 MED ORDER — ALBUTEROL SULFATE HFA 108 (90 BASE) MCG/ACT IN AERS: 2.0000 | INHALATION_SPRAY | RESPIRATORY_TRACT | Status: DC | PRN

## 2016-03-28 MED ORDER — IPRATROPIUM BROMIDE 0.02 % IN SOLN
0.5000 mg | Freq: Once | RESPIRATORY_TRACT | Status: AC
  Administered 2016-03-28: 0.5 mg via RESPIRATORY_TRACT

## 2016-03-28 MED ORDER — AZITHROMYCIN 250 MG PO TABS: ORAL_TABLET | ORAL | Status: DC

## 2016-03-28 MED ORDER — FLUCONAZOLE 150 MG PO TABS: 150.0000 mg | ORAL_TABLET | Freq: Once | ORAL | Status: DC

## 2016-03-28 MED ORDER — ALBUTEROL SULFATE (2.5 MG/3ML) 0.083% IN NEBU
2.5000 mg | INHALATION_SOLUTION | Freq: Once | RESPIRATORY_TRACT | Status: AC
  Administered 2016-03-28: 2.5 mg via RESPIRATORY_TRACT

## 2016-03-28 NOTE — Patient Instructions (Signed)
     IF you received an x-ray today, you will receive an invoice from [facility]. Please contact [facility] at [phone] with questions or concerns regarding your invoice.   IF you received labwork today, you will receive an invoice from [facility]. Please contact [facility] at [phone] with questions or concerns regarding your invoice.   Our billing staff will not be able to assist you with questions regarding bills from these companies.  You will be contacted with the lab results as soon as they are available. The fastest way to get your results is to activate your My Chart account. Instructions are located on the last page of this paperwork. If you have not heard from us regarding the results in 2 weeks, please contact this office.      

## 2016-03-28 NOTE — Progress Notes (Addendum)
Patient ID: [name], female   DOB: [birth year], 45 y.o.   MRN: [id number]      By signing my name below I, Tereasa Coop, attest that this documentation has been prepared under the direction and in the presence of Arlyss Queen, MD. Electonically Signed. Tereasa Coop, Scribe 03/28/2016 at 9:01 AM   Chief Complaint:  Chief Complaint  Patient presents with  . Sore Throat  . Cough    x 5 days    HPI: [name] is a 45 y.o. female who reports to [facility] today complaining of sore throat and sinus drainage for the past 5 days. Pt has also started developing a worsening cough that causes a burning CP when she coughs. Pt denies any fever. Pt takes Natalizumab. Pt has been taking theraflu, zyrtec, mucinex, and OTC cold medication with minimal relief.   Pt is an active smoker.  Pt states that she has had a tubal ligation.  Pt also reports having a darker than usual urine.   Pt just started her period.   Past Medical History  Diagnosis Date  . Hypertension   . Smoker   . Allergy   . Anemia   . Depression   . Anxiety   . Sickle cell trait ([place])   . Iron overload, transfusional   . Sickle cell anemia ([facility])     trait  . Clotting disorder ([place])     from beeing anemic  . Migraine   . Tachycardia   . OSA on CPAP    Past Surgical History  Procedure Laterality Date  . Cervical ablation    . Tubal ligation    . Cholecystectomy    . Cesarean section      3 times   Social History   Social History  . Marital Status: Single    Spouse Name: N/A  . Number of Children: 3  . 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 Smoking status: Current Every Day Smoker -- 0.25 packs/day for 25 years    Types: Cigarettes    Last Attempt to Quit: 10/16/2014  . Smokeless tobacco: Never Used     Comment: smoke 5 cigarettes/day or prn  . Alcohol Use: 0.0 oz/week    0 Standard drinks or equivalent per week     Comment: once maybe a  month if that  . Drug Use: No  . Sexual Activity:    Partners: Male    Birth Control/ Protection: Condom   Other Topics Concern  . None   Social History Narrative   Patient lives at home with her family.   Caffeine Use: 1 cup daily   Family History  Problem Relation Age of Onset  . Mental illness Mother   . Hyperlipidemia Mother   . Hyperthyroidism Mother   . Mental retardation Mother   . ADD / ADHD Son   . Diabetes Father   . Cancer Father   . Cancer Paternal Aunt     breast  . Arthritis Maternal Grandmother   . Diabetes Maternal Grandmother   . Hearing loss Maternal Grandmother     left hear when she was a child  . Hyperlipidemia Maternal Grandmother   . Hypertension Maternal Grandmother   . Alcohol abuse Maternal Grandfather   . Early death Maternal Grandfather   . Cancer Paternal Aunt     breast  . Multiple sclerosis Cousin  Allergies  Allergen Reactions  . Iron Shortness Of Breath    IV only   . Delsym [Dextromethorphan Polistirex Er] Other (See Comments)    nightmares  . Hydrocodone-Guaifenesin Other (See Comments)    nightmares  . Doxycycline Other (See Comments)    Bad taste to patient   Prior to Admission medications   Medication Sig Start Date End Date Taking? Authorizing Provider  aspirin 81 MG tablet Take 81 mg by mouth daily.   Yes Historical Provider, MD  ergocalciferol (DRISDOL) 50000 UNITS capsule Take 1 capsule (50,000 Units total) by mouth once a week. 04/21/15 04/20/16 Yes [name], MD  Natalizumab (TYSABRI IV) Inject 300 mg into the vein every 30 (thirty) days.   Yes Historical Provider, MD  telmisartan-hydrochlorothiazide (MICARDIS HCT) 80-12.5 MG tablet 1 tab po qd 01/01/16  Yes [name], MD     ROS: The patient denies fevers, chills, night sweats, unintentional weight loss, palpitations, wheezing, dyspnea on exertion, nausea, vomiting, abdominal pain, dysuria, hematuria, melena, numbness, weakness, or tingling. Pt is positive for  cough, sore throat, and sinus congestion.   All other systems have been reviewed and were otherwise negative with the exception of those mentioned in the HPI and as above.    PHYSICAL EXAM: Filed Vitals:   03/28/16 0812  BP: 130/82  Pulse: 83  Temp: 98.1 F (36.7 C)  Resp: 18   There is no weight on file to calculate BMI.   General: Alert, no acute distress, ill and non toxic HEENT:  Normocephalic, atraumatic, oropharynx patent, Pt has clear rhinorrhea. Eye: Juliette Mangle [facility] Cardiovascular:  Regular rate and rhythm, no rubs murmurs or gallops.  No Carotid bruits, radial pulse intact. No pedal edema.  Respiratory:   No wheezes or rales. Pt has coarse breath sounds bilat and rhonchi bilat. Breath sounds equal on both sides. No cyanosis, no use of accessory musculature Abdominal: No organomegaly, abdomen is soft and non-tender, positive bowel sounds.  No masses. Musculoskeletal: Gait intact. No edema, tenderness Skin: No rashes. Neurologic: Facial musculature symmetric. Psychiatric: Patient acts appropriately throughout our interaction. Lymphatic: No cervical or submandibular lymphadenopathy  LABS: Results for orders placed or performed in visit on 03/28/16  POCT CBC  Result Value Ref Range   WBC 9.4 4.6 - 10.2 K/uL   Lymph, poc 2.7 0.6 - 3.4   POC LYMPH PERCENT 28.5 10 - 50 %L   MID (cbc) 0.7 0 - 0.9   POC MID % 7.0 0 - 12 %M   POC Granulocyte 6.1 2 - 6.9   Granulocyte percent 64.5 37 - 80 %G   RBC 5.08 4.04 - 5.48 M/uL   Hemoglobin 11.5 (A) 12.2 - 16.2 g/dL   HCT, POC 35.2 (A) 37.7 - 47.9 %   MCV 69.2 (A) 80 - 97 fL   MCH, POC 22.7 (A) 27 - 31.2 pg   MCHC 32.8 31.8 - 35.4 g/dL   RDW, POC 18.4 %   Platelet Count, POC 208 142 - 424 K/uL   MPV 10.7 0 - 99.8 fL  POCT rapid strep A  Result Value Ref Range   Rapid Strep A Screen Negative Negative  POCT Influenza A/B  Result Value Ref Range   Influenza A, POC Negative Negative   Influenza B, POC Negative Negative  POCT  Microscopic Urinalysis ([employer])  Result Value Ref Range   WBC,UR,HPF,POC None None WBC/hpf   RBC,UR,HPF,POC Few (A) None RBC/hpf   Bacteria None None, Too numerous to count  Mucus Present (A) Absent   Epithelial Cells, UR Per Microscopy Many (A) None, Too numerous to count cells/hpf  POCT urinalysis dipstick  Result Value Ref Range   Color, UA yellow yellow   Clarity, UA clear clear   Glucose, UA negative negative   Bilirubin, UA negative negative   Ketones, POC UA negative negative   Spec Grav, UA 1.020    Blood, UA moderate (A) negative   pH, UA 5.5    Protein Ur, POC negative negative   Urobilinogen, UA 0.2    Nitrite, UA Negative Negative   Leukocytes, UA Negative Negative  POCT Wet + KOH Prep  Result Value Ref Range   Yeast by KOH Present Present, Absent   Yeast by wet prep Absent Present, Absent   WBC by wet prep None None, Few, Too numerous to count   Clue Cells Wet Prep HPF POC Few (A) None, Too numerous to count   Trich by wet prep Absent Present, Absent   Bacteria Wet Prep HPF POC None None, Few, Too numerous to count   Epithelial Cells By [employer] Pref ([facility]) Few None, Few, Too numerous to count   RBC,UR,HPF,POC Few (A) None RBC/hpf      EKG/XRAY:   Primary read interpreted by Dr. Everlene Farrier at [facility].   ASSESSMENT/PLAN:  Patient seems to started with allergies. Her testing is essentially normal except for use on her wet prep. She will be treated with a Z-Pak and albuterol inhaler. In 10 days she will take her diflucan . She will not take it while on the Zithromax. She was given a note for 48 hours.I personally performed the services described in this documentation, which was scribed in my presence. The recorded information has been reviewed and is accurate.    Gross sideeffects, risk and benefits, and alternatives of medications d/w patient. Patient is aware that all medications have potential sideeffects and we are unable to predict every sideeffect or drug-drug  interaction that may occur.  Arlyss Queen MD 03/28/2016 8:33 AM

## 2016-03-30 ENCOUNTER — Telehealth: Payer: Self-pay | Admitting: Emergency Medicine

## 2016-03-30 ENCOUNTER — Emergency Department (HOSPITAL_COMMUNITY)
Admission: EM | Admit: 2016-03-30 | Discharge: 2016-03-30 | Disposition: A | Discharge status: Home / Self Care | Payer: 59 | Attending: Emergency Medicine | Admitting: Emergency Medicine

## 2016-03-30 ENCOUNTER — Encounter (HOSPITAL_COMMUNITY): Payer: Self-pay | Admitting: Emergency Medicine

## 2016-03-30 DIAGNOSIS — Z7982 Long term (current) use of aspirin: Secondary | ICD-10-CM | POA: Diagnosis not present

## 2016-03-30 DIAGNOSIS — T50995A Adverse effect of other drugs, medicaments and biological substances, initial encounter: Secondary | ICD-10-CM | POA: Diagnosis not present

## 2016-03-30 DIAGNOSIS — F17210 Nicotine dependence, cigarettes, uncomplicated: Secondary | ICD-10-CM | POA: Insufficient documentation

## 2016-03-30 DIAGNOSIS — I10 Essential (primary) hypertension: Secondary | ICD-10-CM | POA: Diagnosis not present

## 2016-03-30 DIAGNOSIS — Z8659 Personal history of other mental and behavioral disorders: Secondary | ICD-10-CM | POA: Insufficient documentation

## 2016-03-30 DIAGNOSIS — R059 Cough, unspecified: Secondary | ICD-10-CM

## 2016-03-30 DIAGNOSIS — R42 Dizziness and giddiness: Secondary | ICD-10-CM | POA: Diagnosis not present

## 2016-03-30 DIAGNOSIS — T50905A Adverse effect of unspecified drugs, medicaments and biological substances, initial encounter: Secondary | ICD-10-CM

## 2016-03-30 DIAGNOSIS — Z79899 Other long term (current) drug therapy: Secondary | ICD-10-CM | POA: Insufficient documentation

## 2016-03-30 DIAGNOSIS — Z8639 Personal history of other endocrine, nutritional and metabolic disease: Secondary | ICD-10-CM | POA: Diagnosis not present

## 2016-03-30 DIAGNOSIS — R05 Cough: Secondary | ICD-10-CM | POA: Diagnosis not present

## 2016-03-30 DIAGNOSIS — Z862 Personal history of diseases of the blood and blood-forming organs and certain disorders involving the immune mechanism: Secondary | ICD-10-CM | POA: Insufficient documentation

## 2016-03-30 DIAGNOSIS — G4733 Obstructive sleep apnea (adult) (pediatric): Secondary | ICD-10-CM | POA: Diagnosis not present

## 2016-03-30 DIAGNOSIS — Z760 Encounter for issue of repeat prescription: Secondary | ICD-10-CM | POA: Diagnosis not present

## 2016-03-30 DIAGNOSIS — R6883 Chills (without fever): Secondary | ICD-10-CM | POA: Diagnosis not present

## 2016-03-30 DIAGNOSIS — G35 Multiple sclerosis: Secondary | ICD-10-CM | POA: Diagnosis not present

## 2016-03-30 DIAGNOSIS — R69 Illness, unspecified: Secondary | ICD-10-CM | POA: Diagnosis not present

## 2016-03-30 LAB — CULTURE, GROUP A STREP: ORGANISM ID, BACTERIA: NORMAL

## 2016-03-30 NOTE — Discharge Instructions (Signed)
Please discontinue your z-pack. Return to the emergency room immediately for new or worsening symptoms. Please call your primary care provider to schedule a follow up appointment as soon as possible.

## 2016-03-30 NOTE — Telephone Encounter (Signed)
Patient having riders well over to get her treatment for MS. She will be transported via EMS for evaluation at the hospital at [facility].

## 2016-03-30 NOTE — ED Notes (Signed)
Pt brought in by [facility] from [facility] where patient receives MS infusions. Pt has been receiving these same infusions for "years." today while receiving infusion she began shaking and coughing with dizziness. The office treated her with benadryl, solucortef, and tylenol. Upon receiving medications patient symptoms subsided. Pt arrives with 24 g to [facility] in [place]. Ambulatory to restroom.

## 2016-03-30 NOTE — ED Provider Notes (Signed)
CSN: [id number]     Arrival date & time 03/30/16  1013 History   First MD Initiated Contact with Patient 03/30/16 1019     Chief Complaint  Patient presents with  . Medication Reaction    HPI  [name] is an 45 y.o. female with history of MS, HTN, anemia, who presents to the ED for evaluation of a possible medication reaction. She states she was at [facility] receiving her monthly MS infusion when she suddenly had intense shaking chills all over her body but also felt very hot. She states that she started coughing and felt a little dizzy but not lightheaded and did not think she was going to pass out. She states she started to feel a little better but could not stop shaking. She states the office members gave her benadryl, tylenol, and solucortef. States upon medication administration her symptoms resolved. She states now in the ED she feels back to baseline but drowsy from benadryl. She denies throat swelling, itching, rash, chest pain, difficulty breathing. She states she was recently prescribed a z-pack for a cough and states that [facility] thought that the z-pack could have had an interaction with her MS medication. She states she feels back to baseline and declines any kind of workup in the ED today. She is amenable to observation and re-assessment.   Past Medical History  Diagnosis Date  . Hypertension   . Smoker   . Allergy   . Anemia   . Depression   . Anxiety   . Sickle cell trait ([place])   . Iron overload, transfusional   . Sickle cell anemia ([facility])     trait  . Clotting disorder ([place])     from beeing anemic  . Migraine   . Tachycardia   . OSA on CPAP    Past Surgical History  Procedure Laterality Date  . Cervical ablation    . Tubal ligation    . Cholecystectomy    . Cesarean section      3 times   Family History  Problem Relation Age of Onset  . Mental illness Mother   . Hyperlipidemia Mother   . Hyperthyroidism Mother   . Mental retardation Mother    . ADD / ADHD Son   . Diabetes Father   . Cancer Father   . Cancer Paternal Aunt     breast  . Arthritis Maternal Grandmother   . Diabetes Maternal Grandmother   . Hearing loss Maternal Grandmother     left hear when she was a child  . Hyperlipidemia Maternal Grandmother   . Hypertension Maternal Grandmother   . Alcohol abuse Maternal Grandfather   . Early death Maternal Grandfather   . Cancer Paternal Aunt     breast  . Multiple sclerosis Cousin    Social History  Substance Use Topics  . Smoking status: Current Every Day Smoker -- 0.25 packs/day for 25 years    Types: Cigarettes    Last Attempt to Quit: 10/16/2014  . Smokeless tobacco: Never Used     Comment: smoke 5 cigarettes/day or prn  . Alcohol Use: 0.0 oz/week    0 Standard drinks or equivalent per week     Comment: once maybe a month if that   OB History    No data available     Review of Systems  All other systems reviewed and are negative.     Allergies  Iron; Delsym; Hydrocodone-guaifenesin; and Doxycycline  Home Medications  Prior to Admission medications   Medication Sig Start Date End Date Taking? Authorizing Provider  aspirin 81 MG tablet Take 81 mg by mouth daily.   Yes Historical Provider, MD  azithromycin (ZITHROMAX) 250 MG tablet Take 2 tabs PO x 1 dose, then 1 tab PO QD x 4 days 03/28/16  Yes [name], MD  benzonatate (TESSALON) 100 MG capsule Take 100 mg by mouth 3 (three) times daily as needed for cough.   Yes Historical Provider, MD  ergocalciferol (DRISDOL) 50000 UNITS capsule Take 1 capsule (50,000 Units total) by mouth once a week. 04/21/15 04/20/16 Yes [name], MD  Natalizumab (TYSABRI IV) Inject 300 mg into the vein every 30 (thirty) days.   Yes Historical Provider, MD  telmisartan-hydrochlorothiazide (MICARDIS HCT) 80-12.5 MG tablet 1 tab po qd 01/01/16  Yes [name], MD  albuterol (PROVENTIL HFA;VENTOLIN HFA) 108 (90 Base) MCG/ACT inhaler Inhale 2 puffs into the lungs every  4 (four) hours as needed for wheezing or shortness of breath (cough, shortness of breath or wheezing.). Patient not taking: Reported on 03/30/2016 03/28/16   [name], MD  fluconazole (DIFLUCAN) 150 MG tablet Take 1 tablet (150 mg total) by mouth once. Repeat if needed 03/28/16   [name], MD   BP 147/105 mmHg  Pulse 120  Temp(Src) 99.2 F (37.3 C) (Oral)  Resp 18  SpO2 100%  LMP 03/30/2016 Physical Exam  Constitutional: She is oriented to person, place, and time. No distress.  HENT:  Right Ear: External ear normal.  Left Ear: External ear normal.  Nose: Nose normal.  Mouth/Throat: Oropharynx is clear and moist. No oropharyngeal exudate.  Eyes: Conjunctivae and EOM are normal. Pupils are equal, round, and reactive to light.  Neck: Normal range of motion. Neck supple.  Cardiovascular: Normal rate, regular rhythm, normal heart sounds and intact distal pulses.   Pulmonary/Chest: Effort normal and breath sounds normal. No respiratory distress. She has no wheezes.  Abdominal: Soft. Bowel sounds are normal. She exhibits no distension. There is no tenderness.  Musculoskeletal: She exhibits no edema.  Neurological: She is alert and oriented to person, place, and time. No cranial nerve deficit.  Skin: Skin is warm and dry. She is not diaphoretic.  Psychiatric: She has a normal mood and affect.  Nursing note and vitals reviewed.  Filed Vitals:   03/30/16 1025 03/30/16 1104  BP: 147/105 135/89  Pulse: 120 95  Temp: 99.2 F (37.3 C)   TempSrc: Oral   Resp: 18 18  SpO2: 100% 100%     ED Course  Procedures (including critical care time) Labs Review Labs Reviewed - No data to display  Imaging Review No results found. I have personally reviewed and evaluated these images and lab results as part of my medical decision-making.   EKG Interpretation None      MDM   Final diagnoses:  Medication reaction, initial encounter  Cough    Pt refused any kind of lab workup or  imaging. Her exam is nonfocal. Initial tachycardia improved. She is breathing comfortably on room air with no hypoxia. No increased WOB or tachypnea. She is tolerating secretions with no evidence of angioedema. She remains asympttomatic her entire ED stay. It is certainly possible her symptoms were due to an interaction between azithromycin and her MS medication. INstructed to d/c z-pack. She states her URI symptoms are resolving. Instructed close PCP f/u. Strict ER return precautions given.    Anne Ng, PA-C 03/31/16 1019  Fredia Sorrow, MD 04/02/16 [phone]

## 2016-03-30 NOTE — Addendum Note (Signed)
Addended by: Arlyss Queen A on: 03/30/2016 10:01 AM   Modules accepted: Miquel Dunn

## 2016-04-01 ENCOUNTER — Other Ambulatory Visit: Payer: Self-pay

## 2016-04-01 NOTE — Patient Outreach (Signed)
[facility]) Care Management  04/01/2016  TYRIA MARLETTE 1971-05-13 [id number]   Phone call for high cost referral assessment.  No answer and message left. Peter Garter RN, [facility] Care Management Coordinator-Link to [place] Management [phone]

## 2016-04-05 ENCOUNTER — Other Ambulatory Visit: Payer: Self-pay | Admitting: Emergency Medicine

## 2016-04-05 ENCOUNTER — Other Ambulatory Visit: Payer: Self-pay

## 2016-04-05 DIAGNOSIS — B3731 Acute candidiasis of vulva and vagina: Secondary | ICD-10-CM

## 2016-04-05 DIAGNOSIS — B373 Candidiasis of vulva and vagina: Secondary | ICD-10-CM

## 2016-04-05 MED ORDER — FLUCONAZOLE 150 MG PO TABS
150.0000 mg | ORAL_TABLET | Freq: Once | ORAL | Status: DC
Start: 2016-04-05 — End: 2016-04-29

## 2016-04-05 NOTE — Patient Outreach (Signed)
[facility]) Care Management  04/05/2016  KATALIYAH PRINZO 04/20/1971 [id number]  Phone call for high cost referral assessment.  Member has MS and HTN. Member states that she has been doing good other than she had a reaction last week with her bronchitis medication and her infusion for her MS.  States she is now OK and back to work.  States her B/P at home has been stable.  Denies needing any help with ADL/IADLs.  Denies needing any case management services at this time. Member had been seen in [facility] program for HTN in the past and is familiar with Psychologist, educational services. Reinforced to call in the future if her needs change.  Member has information on how to contact [employer] if needed. Member assessed with no further interventions needed. Peter Garter RN, [facility] Care Management Coordinator-Link to [place] Management [phone]

## 2016-04-05 NOTE — Telephone Encounter (Signed)
Patient requested to send her Rx that was printed sent to her pharmacy.

## 2016-04-06 ENCOUNTER — Telehealth: Payer: Self-pay | Admitting: *Deleted

## 2016-04-06 ENCOUNTER — Other Ambulatory Visit: Payer: Self-pay | Admitting: Physician Assistant

## 2016-04-06 DIAGNOSIS — N898 Other specified noninflammatory disorders of vagina: Secondary | ICD-10-CM

## 2016-04-06 MED ORDER — TINIDAZOLE 500 MG PO TABS: 2.0000 g | ORAL_TABLET | Freq: Every day | ORAL | Status: AC

## 2016-04-06 NOTE — Telephone Encounter (Signed)
Pt states that she thinks she may have bv and would like something sent in.  She just started on fluconazole yesterday.  She has vaginal odor.

## 2016-04-06 NOTE — Telephone Encounter (Signed)
Patient had recent abx.  Malodorous odor without abdominal pain, fever, nausea, or urinary symptoms.  She was treated for yeast infection within 10 days.  We will give her tinidazole at this time for 2 day 2gm regimen.

## 2016-04-27 ENCOUNTER — Ambulatory Visit (INDEPENDENT_AMBULATORY_CARE_PROVIDER_SITE_OTHER): Payer: 59 | Admitting: Family Medicine

## 2016-04-27 ENCOUNTER — Telehealth: Payer: Self-pay | Admitting: Diagnostic Neuroimaging

## 2016-04-27 VITALS — BP 152/104 | HR 94 | Temp 98.6°F | Resp 16

## 2016-04-27 DIAGNOSIS — T7840XA Allergy, unspecified, initial encounter: Secondary | ICD-10-CM

## 2016-04-27 DIAGNOSIS — T8092XA Unspecified transfusion reaction, initial encounter: Secondary | ICD-10-CM | POA: Diagnosis not present

## 2016-04-27 DIAGNOSIS — G35 Multiple sclerosis: Secondary | ICD-10-CM | POA: Diagnosis not present

## 2016-04-27 MED ORDER — METHYLPREDNISOLONE SODIUM SUCC 125 MG IJ SOLR
125.0000 mg | Freq: Once | INTRAMUSCULAR | Status: AC
  Administered 2016-04-27: 125 mg via INTRAVENOUS

## 2016-04-27 MED ORDER — DIPHENHYDRAMINE HCL 50 MG/ML IJ SOLN
12.5000 mg | Freq: Once | INTRAMUSCULAR | Status: AC
  Administered 2016-04-27: 12.5 mg via INTRAVENOUS

## 2016-04-27 NOTE — Progress Notes (Addendum)
Subjective:  By signing my name below, I, Moises Blood, attest that this documentation has been prepared under the direction and in the presence of Delman Cheadle, MD. Electronically Signed: Moises Blood, [place]. 04/27/2016 , 10:07 AM .  By signing my name below, I, Mesha Guinyard, attest that this documentation has been prepared under the direction and in the presence of Delman Cheadle, MD.  Electronically Signed: Verlee Monte, Medical Scribe. 04/27/2016. 10:07 AM.  Patient was seen in Room 7 .   Patient ID: [name], female    DOB: [birth year], 45 y.o.   MRN: [id number] Chief Complaint  Patient presents with  . Allergic Reaction    Tysabri infusion reaction    HPI [name] is a 45 y.o. female with a PMx of MS. Patient was brought back emergently with dizziness, leg pain, tremors and heavy breathing. She had an effusion of Tysabri this morning and only did half of the bag and stopped after she felt like she was developing some joint pain. She walked out of the clinic at 9:24AM today. Pt developed shaking rigors 3 mins from her home so they turned around and came straight here. She thought she was having a seizure. She has an appt with neurology Dr. Leta Baptist in 2 days to discuss further treatment. She's had this reaction to the Tysabri as well last mo 03/30/16 when she developed chills, hot flashes, and dizziness.  She was treated with Solucortef, tylenol, and benadryl though was still sent to the ED for additional monitoring. She hasn't taken anything for this episode prior to arrival of than the 1/2 of the tysabri infusion.   Patient Active Problem List   Diagnosis Date Noted  . Prediabetes   . MS (multiple sclerosis) ([place])   . Obesity, Class III, BMI 40-49.9 (morbid obesity) ([place]) 10/16/2014  . Smoker 10/16/2014  . OSA on CPAP 10/16/2014  . HTN (hypertension) 01/14/2012  . Sickle cell trait ([place]) 01/14/2012   Past Medical History  Diagnosis Date  . Hypertension   . Smoker     . Allergy   . Anemia   . Depression   . Anxiety   . Sickle cell trait ([place])   . Iron overload, transfusional   . Sickle cell anemia ([facility])     trait  . Clotting disorder ([place])     from beeing anemic  . Migraine   . Tachycardia   . OSA on CPAP    Past Surgical History  Procedure Laterality Date  . Cervical ablation    . Tubal ligation    . Cholecystectomy    . Cesarean section      3 times   Allergies  Allergen Reactions  . Iron Shortness Of Breath    IV only   . Delsym [Dextromethorphan Polistirex Er] Other (See Comments)    nightmares  . Hydrocodone-Guaifenesin Other (See Comments)    nightmares  . Doxycycline Other (See Comments)    Bad taste to patient   Prior to Admission medications   Medication Sig Start Date End Date Taking? Authorizing Provider  albuterol (PROVENTIL HFA;VENTOLIN HFA) 108 (90 Base) MCG/ACT inhaler Inhale 2 puffs into the lungs every 4 (four) hours as needed for wheezing or shortness of breath (cough, shortness of breath or wheezing.). Patient not taking: Reported on 03/30/2016 03/28/16   [name], MD  aspirin 81 MG tablet Take 81 mg by mouth daily.    Historical Provider, MD  azithromycin (ZITHROMAX) 250 MG tablet Take 2  tabs PO x 1 dose, then 1 tab PO QD x 4 days 03/28/16   [name], MD  benzonatate (TESSALON) 100 MG capsule Take 100 mg by mouth 3 (three) times daily as needed for cough.    Historical Provider, MD  fluconazole (DIFLUCAN) 150 MG tablet Take 1 tablet (150 mg total) by mouth once. Repeat if needed 04/05/16   [name], MD  Natalizumab (TYSABRI IV) Inject 300 mg into the vein every 30 (thirty) days.    Historical Provider, MD  telmisartan-hydrochlorothiazide (MICARDIS HCT) 80-12.5 MG tablet TAKE 1 TABLET BY MOUTH ONCE DAILY 04/05/16   [name], MD   Social History   Social History  . Marital Status: Single    Spouse Name: N/A  . Number of Children: 3  . 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 Smoking status: Current Every Day Smoker -- 0.25 packs/day for 25 years    Types: Cigarettes    Last Attempt to Quit: 10/16/2014  . Smokeless tobacco: Never Used     Comment: smoke 5 cigarettes/day or prn  . Alcohol Use: 0.0 oz/week    0 Standard drinks or equivalent per week     Comment: once maybe a month if that  . Drug Use: No  . Sexual Activity:    Partners: Male    Birth Control/ Protection: Condom   Other Topics Concern  . Not on file   Social History Narrative   Patient lives at home with her family.   Caffeine Use: 1 cup daily   Depression screen [facility] 2/9 03/28/2016 03/12/2016 03/08/2016 11/17/2015 06/08/2015  Decreased Interest 0 0 0 0 0  Down, Depressed, Hopeless 0 0 0 0 0  PHQ - 2 Score 0 0 0 0 0    Review of Systems  Respiratory: Positive for shortness of breath.   Gastrointestinal: Negative for nausea and vomiting.  Musculoskeletal: Positive for myalgias.  Neurological: Positive for dizziness and tremors.    Objective:   Physical Exam  Constitutional: She is oriented to person, place, and time. She appears well-developed and well-nourished. No distress.  HENT:  Head: Normocephalic and atraumatic.  Right Ear: External ear normal.  Left Ear: External ear normal.  Eyes: Conjunctivae are normal. No scleral icterus.  Neck: Normal range of motion. Neck supple. No thyromegaly present.  Cardiovascular: Normal rate, regular rhythm, normal heart sounds and intact distal pulses.   Pulmonary/Chest: Effort normal and breath sounds normal. No respiratory distress.  Musculoskeletal: She exhibits no edema.  Lymphadenopathy:    She has no cervical adenopathy.  Neurological: She is alert and oriented to person, place, and time.  Skin: Skin is warm and dry. She is not diaphoretic. No erythema.  Psychiatric: She has a normal mood and affect. Her behavior is normal.    Filed Vitals:   04/27/16 0959 04/27/16 1002 04/27/16 1013  BP:  169/121 130/88 130/102  Pulse: 120  91  Resp: 20    SpO2: 96%  99%   BP 152/104 mmHg  Pulse 94  Temp(Src) 98.6 F (37 C) (Oral)  Resp 16  SpO2 96%  LMP 03/30/2016  [10:55 PM] Symptoms have improved  Assessment & Plan:   1. Allergic reaction, initial encounter   Transfusion reaction from her Tysabri - spoke with Dr. Leta Baptist who advised monitoring pt for 30-60 min - should not need any further immed treatment after  VS and sxs have resolved. No sig concern for recurrence of sxs in the next few hrs. Pt will f/u with Dr. Leta Baptist in [place] in 48 hrs to discuss further trx.   Meds ordered this encounter  Medications  . methylPREDNISolone sodium succinate (SOLU-MEDROL) 125 mg/2 mL injection 125 mg    Sig:   . diphenhydrAMINE (BENADRYL) injection 12.5 mg    Sig:   given IV  I personally performed the services described in this documentation, which was scribed in my presence. The recorded information has been reviewed and considered, and addended by me as needed.   Delman Cheadle, M.D.  [facility] [address] [place],  [address] [phone] phone [phone] fax  04/27/2016 12:08 PM

## 2016-04-27 NOTE — Telephone Encounter (Signed)
Call this number -daughter with patient at Urgent Care reaction to infusion [phone]

## 2016-04-27 NOTE — Telephone Encounter (Signed)
Patient is having a reaction to the infusion - she is at Urgent care. Patient's cell phone is [phone]

## 2016-04-27 NOTE — Telephone Encounter (Signed)
I called Dr. Brigitte Pulse @ 10:20am, and recommend monitoring for 1 hour until patient symptoms resolved. I will see patient this Friday to discuss new medication options. -VRP

## 2016-04-27 NOTE — Telephone Encounter (Signed)
Returned call and spoke with Dr Brigitte Pulse. She was inquiring about patient's last Tysabri infusion and her Tysabri infusion today. Informed her that Tina infusion RN has that information and offered for her to speak with Otila Kluver. Dr Brigitte Pulse asked to speak with Dr Leta Baptist. Dr Leta Baptist discussed patient's infusions, patient was stable, but  Per Dr Leta Baptist she was to be monitored for one hour at [facility].

## 2016-04-29 ENCOUNTER — Ambulatory Visit (INDEPENDENT_AMBULATORY_CARE_PROVIDER_SITE_OTHER): Payer: 59 | Admitting: Diagnostic Neuroimaging

## 2016-04-29 ENCOUNTER — Encounter: Payer: Self-pay | Admitting: Diagnostic Neuroimaging

## 2016-04-29 VITALS — BP 143/95 | HR 90 | Ht 63.25 in | Wt 290.4 lb

## 2016-04-29 DIAGNOSIS — Z5181 Encounter for therapeutic drug level monitoring: Secondary | ICD-10-CM

## 2016-04-29 DIAGNOSIS — Z79899 Other long term (current) drug therapy: Secondary | ICD-10-CM | POA: Diagnosis not present

## 2016-04-29 DIAGNOSIS — G35 Multiple sclerosis: Secondary | ICD-10-CM

## 2016-04-29 NOTE — Patient Instructions (Signed)
-   for multiple sclerosis -->  - STOP tysabri - start copaxone within next 2-4 weeks - continue physical activity, PT exercises - repeat labs - repeat MRI  - for joint aches/pains --> - consider low dose fish oil capsule (1-2 per day OTC) for joint pains and anti-inflammatory effect - consider occasional ibuprofen as needed for joint pain - consider yoga or water therapy - reduce sugar / carbohydrate intake

## 2016-04-29 NOTE — Progress Notes (Signed)
[facility]  PATIENT: [name] DOB: 1971/03/31  REFERRING CLINICIAN:  HISTORY FROM: patient REASON FOR VISIT: follow up   HISTORICAL  CHIEF COMPLAINT:  Chief Complaint  Patient presents with  . Multiple Sclerosis    rm 7, Tysabri, 06/2015 MRI brain, 01/2016 JCV 0.46-neg  . Follow-up    3 month    HISTORY OF PRESENT ILLNESS:   UPDATE 04/29/16: Since last visit, had possible infusion reaction on 03/30/16 here at infusion center (shaking, chills, malaise), in setting of URI and recently starting z-pack. Given benedryl and solumedrol, then went to ER for observation. Had full recovery. This past week, had tysabri infusion last Wednesday (04/27/16) and was doing well initially, until she developed joint pain (knees, shoulder). Then infusion stopped. Then patient returned home, then developed shaking chills, and went to urgent care for observation. Now doing well. Here to discuss other MS med options.  UPDATE 01/22/16: Since last visit, doing well. Some fatigue on busy work days (Mon and Fri). No new neuro sxs. Tolerating tysabri. Some joint aches pains in arms and wrists.   UPDATE 09/07/15: Since last visit, doing well. Tolerating tysabri. No new attacks.  ew events neurologically. Some more leg swelling lately, esp with sitting for long time at work. 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  UPDATE 06/02/15: Doing well. Strength good. No numbness. Fatigue better. Concentration is better. Intermittent word finding diff (scanning speech). Tolerating tysabri infusions. 3 weeks ago noted fine small bumps on arms, mild itching. Some int headaches in temporal regions, mild.   UPDATE 03/03/15: Since last visit, more right hip and low back pain. Intermittent. Had tysabri on March 14 and Apr 11. Overall c/o fatigue, focusing issues. 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  UPDATE 12/01/14: Since last visit, left  side weakness has improved. Now having more problems with right knee pain, esp walking longer distances. Awaiting tysabri scheduling.   PRIOR HPI (10/29/14): 45 year old right-handed female here for evaluation of multiple sclerosis. 10/15/14 patient developed staggering to the left side. The next day she noted left arm and left leg heaviness and weakness. Patient was in the hospital and had evaluation. MRI of the brain demonstrated multiple brain lesions suspicious for demyelinating disease. Patient was treated with IV steroids and her left sided symptoms have almost completely resolved. Patient continues to have a slight limp with left leg difficulty. Also has some slight incoordination with left hand. In the past patient has had intermittent headaches. Patient has also had intermittent episodes of knee pain and intermittent vertigo attacks. Patient has family history of multiple sclerosis in maternal uncle and maternal cousin.    REVIEW OF SYSTEMS: Full 14 system review of systems performed and negative except: rash aching muscles headache cramps.   ALLERGIES: Allergies  Allergen Reactions  . Iron Shortness Of Breath    IV only   . Delsym [Dextromethorphan Polistirex Er] Other (See Comments)    nightmares  . Hydrocodone-Guaifenesin Other (See Comments)    nightmares  . Doxycycline Other (See Comments)    Bad taste to patient    HOME MEDICATIONS: Outpatient Prescriptions Prior to Visit  Medication Sig Dispense Refill  . aspirin 81 MG tablet Take 81 mg by mouth daily.    . Natalizumab (TYSABRI IV) Inject 300 mg into the vein every 30 (thirty) days.    Marland Kitchen telmisartan-hydrochlorothiazide (MICARDIS HCT) 80-12.5 MG tablet TAKE 1 TABLET  BY MOUTH ONCE DAILY 30 tablet 0  . albuterol (PROVENTIL HFA;VENTOLIN HFA) 108 (90 Base) MCG/ACT inhaler Inhale 2 puffs into the lungs every 4 (four) hours as needed for wheezing or shortness of breath (cough, shortness of breath or wheezing.). (Patient not taking:  Reported on 03/30/2016) 1 Inhaler 1  . azithromycin (ZITHROMAX) 250 MG tablet Take 2 tabs PO x 1 dose, then 1 tab PO QD x 4 days 6 tablet 0  . benzonatate (TESSALON) 100 MG capsule Take 100 mg by mouth 3 (three) times daily as needed for cough.    . fluconazole (DIFLUCAN) 150 MG tablet Take 1 tablet (150 mg total) by mouth once. Repeat if needed 2 tablet 0   No facility-administered medications prior to visit.    PAST MEDICAL HISTORY: Past Medical History  Diagnosis Date  . Hypertension   . Smoker   . Allergy   . Anemia   . Depression   . Anxiety   . Sickle cell trait ([place])   . Iron overload, transfusional   . Sickle cell anemia ([facility])     trait  . Clotting disorder ([place])     from beeing anemic  . Migraine   . Tachycardia   . OSA on CPAP     PAST SURGICAL HISTORY: Past Surgical History  Procedure Laterality Date  . Cervical ablation    . Tubal ligation    . Cholecystectomy    . Cesarean section      3 times    FAMILY HISTORY: Family History  Problem Relation Age of Onset  . Mental illness Mother   . Hyperlipidemia Mother   . Hyperthyroidism Mother   . Mental retardation Mother   . ADD / ADHD Son   . Diabetes Father   . Cancer Father   . Cancer Paternal Aunt     breast  . Arthritis Maternal Grandmother   . Diabetes Maternal Grandmother   . Hearing loss Maternal Grandmother     left hear when she was a child  . Hyperlipidemia Maternal Grandmother   . Hypertension Maternal Grandmother   . Alcohol abuse Maternal Grandfather   . Early death Maternal Grandfather   . Cancer Paternal Aunt     breast  . Multiple sclerosis Cousin     SOCIAL HISTORY:  Social History   Social History  . Marital Status: Single    Spouse Name: N/A  . Number of Children: 3  . 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 Smoking status: Current Every Day Smoker -- 0.25 packs/day for 25 years    Types: Cigarettes    Last  Attempt to Quit: 10/16/2014  . Smokeless tobacco: Never Used     Comment: smoke 5 cigarettes/day or prn  . Alcohol Use: 0.0 oz/week    0 Standard drinks or equivalent per week     Comment: once maybe a month if that  . Drug Use: No  . Sexual Activity:    Partners: Male    Birth Control/ Protection: Condom   Other Topics Concern  . Not on file   Social History Narrative   Patient lives at home with her family.   Caffeine Use: 1 cup daily     PHYSICAL EXAM  Filed Vitals:   04/29/16 0831  BP: 143/95  Pulse: 90  Height: 5' 3.25" (1.607 m)  Weight: 290 lb 6.4 oz (131.725 kg)  Wt Readings from Last 3 Encounters:  04/29/16 290 lb 6.4 oz (131.725 kg)  01/22/16 295 lb 3.2 oz (133.902 kg)  11/17/15 296 lb (134.265 kg)   Body mass index is 51.01 kg/(m^2).   Visual Acuity Screening   Right eye Left eye Both eyes  Without correction: 20/30 20/40   With correction:       No flowsheet data found.  GENERAL EXAM: Patient is in no distress; well developed, nourished and groomed; neck is supple; FINE SKIN TONE PAPULES ON ARMS BELOW ELBOWS  CARDIOVASCULAR: Regular rate and rhythm, no murmurs, no carotid bruits  NEUROLOGIC: MENTAL STATUS: awake, alert, language fluent, comprehension intact, naming intact, fund of knowledge appropriate CRANIAL NERVE: pupils equal and reactive to light, visual fields full to confrontation, extraocular muscles intact, no nystagmus, facial sensation and strength symmetric, hearing intact, palate elevates symmetrically, uvula midline, shoulder shrug symmetric, tongue midline. MOTOR: normal bulk and tone, full strength in the BUE, BLE SENSORY: normal and symmetric to light touch COORDINATION: finger-nose-finger, fine finger movements normal REFLEXES: deep tendon reflexes present and symmetric GAIT/STATION: narrow based gait; STABLE, SMOOTH     DIAGNOSTIC DATA (LABS, IMAGING, TESTING) - I reviewed patient records, labs, notes, testing and imaging  myself where available.  Lab Results  Component Value Date   WBC 9.4 03/28/2016   HGB 11.5* 03/28/2016   HCT 35.2* 03/28/2016   MCV 69.2* 03/28/2016   PLT 247 01/22/2016      Component Value Date/Time   NA 139 01/22/2016 0900   NA 141 11/11/2015 0828   K 4.7 01/22/2016 0900   CL 103 01/22/2016 0900   CO2 23 01/22/2016 0900   GLUCOSE 102* 01/22/2016 0900   GLUCOSE 100* 11/11/2015 0828   BUN 8 01/22/2016 0900   BUN 9 11/11/2015 0828   CREATININE 0.77 01/22/2016 0900   CREATININE 0.75 11/11/2015 0828   CALCIUM 10.6* 01/22/2016 0900   PROT 6.8 01/22/2016 0900   PROT 7.0 11/11/2015 0828   ALBUMIN 4.1 01/22/2016 0900   ALBUMIN 3.9 11/11/2015 0828   AST 17 01/22/2016 0900   ALT 18 01/22/2016 0900   ALKPHOS 88 01/22/2016 0900   BILITOT 0.3 01/22/2016 0900   BILITOT 0.2 11/11/2015 0828   GFRNONAA 93 01/22/2016 0900   GFRNONAA >89 11/11/2015 0828   GFRAA 107 01/22/2016 0900   GFRAA >89 11/11/2015 0828   Lab Results  Component Value Date   CHOL 166 11/11/2015   HDL 37* 11/11/2015   LDLCALC 105 11/11/2015   TRIG 119 11/11/2015   CHOLHDL 4.5 11/11/2015   Lab Results  Component Value Date   HGBA1C 6.0 11/11/2015   No results found for: [id number] Lab Results  Component Value Date   TSH 0.904 12/12/2013   VIT D, 25-HYDROXY  Date Value Ref Range Status  01/22/2016 25.3* 30.0 - 100.0 ng/mL Final    Comment:    Vitamin D deficiency has been defined by the Institute of Medicine and an Endocrine Society practice guideline as a level of serum 25-OH vitamin D less than 20 ng/mL (1,2). The Endocrine Society went on to further define vitamin D insufficiency as a level between 21 and 29 ng/mL (2). 1. IOM (Institute of Medicine). 2010. Dietary reference    intakes for calcium and D. [place]: The    [employer]. 2. Holick MF, Binkley Bellflower, Bischoff-Ferrari HA, et al.    Evaluation, treatment, and prevention of vitamin D    deficiency: an Endocrine Society  clinical practice  guideline. JCEM. 2011 Jul; 96(7):1911-30.     10/17/14 MRI brain (without)  1. Multiple T2/FLAIR hyperintense lesions with associated restricted diffusion involving the white matter of both cerebral hemispheres, right greater than left, most consistent with active demyelinating disease. 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 Follow-up examination with postcontrast imaging could be performed for complete evaluation. 2. No other findings to suggest acute intracranial infarct or other process.  10/17/14 MRI cervical spine (with and without)  - Motion degraded exam demonstrating no convincing evidence for spinal MS.  Certainly there are no areas of T2 hyperintensity or cord enlargement. No disc protrusion or spinal stenosis.  10/18/14 MRI brain (with) - Findings consistent with acute demyelination in the RIGHT posterior frontal subcortical white matter in this patient with suspected multiple sclerosis. Multiple other areas of T2 signal hyperintensity on the previous study do not display similar postcontrast enhancement. 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  07/08/15 MRI brain [I reviewed images myself and agree with interpretation. -VRP]  1. Regression of multiple sclerosis since December 2015. No active demyelination. 2. Tiny 5 mm right posterior convexity meningioma versus benign dural thickening. This is stable and appears inconsequential.  10/21/14 CSF opening pressure 19cm H2O - (WBC 18, RBC 3545, protein 50, glucose 110, cryptococcal ag negative, IgG index 7.1 (normal), OCB > 5 well defined gamma restriction bands)  10/29/14 JCV ab - negative  08/28/15 JCV ab -  negative     ASSESSMENT AND PLAN  45 y.o. 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 Was on tysabri since Jan 2016 and doing very well, until 2 possible infusion reactions in May 2017 and Jun 2017.    Dx:  MS (multiple sclerosis) ([place]) - Plan: MR Brain W Wo Contrast, CBC with Differential/Platelet, Comprehensive metabolic panel, Stratify JCV Antibody Test ([facility]), Anti-Tysabri (Natalizumab) Ab  Encounter for monitoring natalizumab therapy     PLAN: - for multiple sclerosis -->  - STOP tysabri - start copaxone within next 2-4 weeks (patient does not want oral meds right now) - continue physical activity, PT exercises - repeat labs today - repeat MRI  - for joint aches/pains --> - consider low dose fish oil capsule (1-2 per day OTC) for joint pains and anti-inflammatory effect - consider occasional ibuprofen as needed for joint pain - consider yoga or water therapy - reduce sugar / carbohydrate intake  Orders Placed This Encounter  Procedures  . MR Brain W Wo Contrast  . CBC with Differential/Platelet  . Comprehensive metabolic panel  . Stratify JCV Antibody Test ([facility])  . Anti-Tysabri (Natalizumab) Ab   Return in about 3 months (around 07/30/2016).  I reviewed images, labs, notes, records myself. I summarized findings and reviewed with patient, for this high risk condition (multiple sclerosis, infusion reaction) requiring high complexity decision making.      Penni Bombard, MD [id number], [id number] AM Certified in Neurology, Neurophysiology and Neuroimaging  [facility] [address], [place], [place] [address] [phone]

## 2016-05-05 LAB — CBC WITH DIFFERENTIAL/PLATELET
BASOS: 0 %
Basophils Absolute: 0.1 10*3/uL (ref 0.0–0.2)
EOS (ABSOLUTE): 0.2 10*3/uL (ref 0.0–0.4)
EOS: 2 %
HEMATOCRIT: 36.9 % (ref 34.0–46.6)
HEMOGLOBIN: 10.9 g/dL — AB (ref 11.1–15.9)
Immature Grans (Abs): 0 10*3/uL (ref 0.0–0.1)
Immature Granulocytes: 0 %
LYMPHS ABS: 3.2 10*3/uL — AB (ref 0.7–3.1)
Lymphs: 26 %
MCH: 21.4 pg — AB (ref 26.6–33.0)
MCHC: 29.5 g/dL — AB (ref 31.5–35.7)
MCV: 72 fL — AB (ref 79–97)
MONOCYTES: 5 %
Monocytes Absolute: 0.6 10*3/uL (ref 0.1–0.9)
NEUTROS ABS: 8.2 10*3/uL — AB (ref 1.4–7.0)
Neutrophils: 67 %
Platelets: 295 10*3/uL (ref 150–379)
RBC: 5.1 x10E6/uL (ref 3.77–5.28)
RDW: 18.5 % — ABNORMAL HIGH (ref 12.3–15.4)
WBC: 12.3 10*3/uL — ABNORMAL HIGH (ref 3.4–10.8)

## 2016-05-05 LAB — COMPREHENSIVE METABOLIC PANEL
ALBUMIN: 3.9 g/dL (ref 3.5–5.5)
ALK PHOS: 87 IU/L (ref 39–117)
ALT: 18 IU/L (ref 0–32)
AST: 13 IU/L (ref 0–40)
Albumin/Globulin Ratio: 1.4 (ref 1.2–2.2)
BUN / CREAT RATIO: 14 (ref 9–23)
BUN: 11 mg/dL (ref 6–24)
CO2: 25 mmol/L (ref 18–29)
CREATININE: 0.81 mg/dL (ref 0.57–1.00)
Calcium: 10.3 mg/dL — ABNORMAL HIGH (ref 8.7–10.2)
Chloride: 103 mmol/L (ref 96–106)
GFR calc Af Amer: 101 mL/min/{1.73_m2} (ref >59)
GFR calc non Af Amer: 87 mL/min/{1.73_m2} (ref >59)
GLOBULIN, TOTAL: 2.8 g/dL (ref 1.5–4.5)
Glucose: 96 mg/dL (ref 65–99)
Potassium: 4.5 mmol/L (ref 3.5–5.2)
SODIUM: 142 mmol/L (ref 134–144)
Total Protein: 6.7 g/dL (ref 6.0–8.5)

## 2016-05-05 LAB — ANTI-TYSABRI (NATALIZUMAB) AB: ANTI-TYSABRI(NATALIZUMAB) AB: POSITIVE — AB

## 2016-05-09 ENCOUNTER — Telehealth: Payer: Self-pay

## 2016-05-09 ENCOUNTER — Telehealth: Payer: Self-pay | Admitting: *Deleted

## 2016-05-09 NOTE — Telephone Encounter (Signed)
Per Dr Leta Baptist, LVM for patient informing her that her labs results indicate that she most likely has had allergic reaction to Tysabri. Otherwise her results are unremarkable. The medication was discontinued as of last office visit.  Left name, number for any questions.

## 2016-05-09 NOTE — Telephone Encounter (Signed)
Patient needs her FMLA updated for [employer], I have completed what I could from the [place] notes and highlighted the areas that need to be completed. I will place these forms in Dr. Perfecto Kingdom box on 05/09/16 if you could please return them to the FMLA/Disability box at the [employer] checkout desk within 5-7 business days. Thank you!

## 2016-05-11 NOTE — Telephone Encounter (Signed)
Paperwork scanned and faxed to [employer] and given to patient on 05/11/16

## 2016-05-12 ENCOUNTER — Telehealth: Payer: Self-pay | Admitting: Diagnostic Neuroimaging

## 2016-05-12 NOTE — Telephone Encounter (Signed)
Called patient to ask if she had a preference on where she has her MRI performed. If she calls back please ask her and if she would like a [place] facility please inform her that she can go ahead and call to schedule. Thanks!

## 2016-05-25 ENCOUNTER — Ambulatory Visit (INDEPENDENT_AMBULATORY_CARE_PROVIDER_SITE_OTHER): Payer: 59 | Admitting: Physician Assistant

## 2016-05-25 ENCOUNTER — Telehealth: Payer: Self-pay | Admitting: *Deleted

## 2016-05-25 VITALS — BP 130/70 | HR 109 | Temp 98.5°F | Resp 18 | Ht 63.25 in | Wt 291.0 lb

## 2016-05-25 DIAGNOSIS — R0600 Dyspnea, unspecified: Secondary | ICD-10-CM | POA: Diagnosis not present

## 2016-05-25 DIAGNOSIS — R42 Dizziness and giddiness: Secondary | ICD-10-CM | POA: Diagnosis not present

## 2016-05-25 DIAGNOSIS — Z5181 Encounter for therapeutic drug level monitoring: Secondary | ICD-10-CM

## 2016-05-25 DIAGNOSIS — G35 Multiple sclerosis: Secondary | ICD-10-CM

## 2016-05-25 DIAGNOSIS — T8090XA Unspecified complication following infusion and therapeutic injection, initial encounter: Secondary | ICD-10-CM

## 2016-05-25 NOTE — Telephone Encounter (Signed)
Spoke with patient re: [place] lab result from June. She stated she was already aware of result. She stated she is receiving her first Copaxone injection today at Dr Everlene Farrier, PCP's office. She also reported she is going to schedule the MRI  Dr Leta Baptist ordered in June. She verbalized understanding, appreciation of call.

## 2016-05-25 NOTE — Patient Instructions (Signed)
     IF you received an x-ray today, you will receive an invoice from [facility]. Please contact [facility] at [phone] with questions or concerns regarding your invoice.   IF you received labwork today, you will receive an invoice from [facility]. Please contact [facility] at [phone] with questions or concerns regarding your invoice.   Our billing staff will not be able to assist you with questions regarding bills from these companies.  You will be contacted with the lab results as soon as they are available. The fastest way to get your results is to activate your My Chart account. Instructions are located on the last page of this paperwork. If you have not heard from us regarding the results in 2 weeks, please contact this office.      

## 2016-06-02 ENCOUNTER — Other Ambulatory Visit: Payer: Self-pay | Admitting: *Deleted

## 2016-06-02 ENCOUNTER — Telehealth: Payer: Self-pay | Admitting: *Deleted

## 2016-06-02 ENCOUNTER — Other Ambulatory Visit: Payer: Self-pay | Admitting: Physician Assistant

## 2016-06-02 DIAGNOSIS — N898 Other specified noninflammatory disorders of vagina: Secondary | ICD-10-CM

## 2016-06-02 DIAGNOSIS — B9689 Other specified bacterial agents as the cause of diseases classified elsewhere: Secondary | ICD-10-CM

## 2016-06-02 DIAGNOSIS — N760 Acute vaginitis: Principal | ICD-10-CM

## 2016-06-02 LAB — POCT WET + KOH PREP
TRICH BY WET PREP: ABSENT
YEAST BY KOH: ABSENT
YEAST BY WET PREP: ABSENT

## 2016-06-02 MED ORDER — METRONIDAZOLE 500 MG PO TABS: 500.0000 mg | ORAL_TABLET | Freq: Two times a day (BID) | ORAL | Status: AC

## 2016-06-02 NOTE — Telephone Encounter (Signed)
Pt states that she thinks that she have BV again.  It just not smelling well down there.   Can you send her something in or can we do a lab only visit?

## 2016-06-02 NOTE — Telephone Encounter (Signed)
I have put an orders only in for the patient.

## 2016-06-05 NOTE — Progress Notes (Signed)
[facility] [address], [place] [address] [phone]  Date:  05/25/2016   Name:  [name]   DOB:  1971/07/27   MRN:  [id number]  PCP:  Jodi Reichmann, MD   History of Present Illness:  [name] is a 45 y.o. female patient who presents to [facility] for monitoring. Patient will start copaxone to treat her diagnosis of MS.  She has had a hx of reaction to Tysabri with leg pain, dizziness, tremors, and dyspnea.  This reaction to the transfusion started within hours of administration at the neurologist.  She was given solumedrol and benadryl for successful relief. She is here today with advising nurse to educate of administration. Educator states that exhibiting symptoms of dizziness, pain, and sob can initially occur and resolve within minutes of administration...   Patient Active Problem List   Diagnosis Date Noted  . Prediabetes   . MS (multiple sclerosis) ([place])   . Obesity, Class III, BMI 40-49.9 (morbid obesity) ([place]) 10/16/2014  . Smoker 10/16/2014  . OSA on CPAP 10/16/2014  . HTN (hypertension) 01/14/2012  . Sickle cell trait ([place]) 01/14/2012    Past Medical History:  Diagnosis Date  . Allergy   . Anemia   . Anxiety   . Clotting disorder ([place])    from beeing anemic  . Depression   . Hypertension   . Iron overload, transfusional   . Migraine   . OSA on CPAP   . Sickle cell anemia (HCC)    trait  . Sickle cell trait ([place])   . Smoker   . Tachycardia     Past Surgical History:  Procedure Laterality Date  . CERVICAL ABLATION    . CESAREAN SECTION     3 times  . CHOLECYSTECTOMY    . TUBAL LIGATION      Social History  Substance Use Topics  . Smoking status: Current Every Day Smoker    Packs/day: 0.25    Years: 25.00    Types: Cigarettes    Last attempt to quit: 10/16/2014  . Smokeless tobacco: Never Used     Comment: smoke 5 cigarettes/day or prn  . Alcohol use 0.0 oz/week     Comment: once maybe a month if that     Family History  Problem Relation Age of Onset  . Mental illness Mother   . Hyperlipidemia Mother   . Hyperthyroidism Mother   . Mental retardation Mother   . ADD / ADHD Son   . Diabetes Father   . Cancer Father   . Cancer Paternal Aunt     breast  . Arthritis Maternal Grandmother   . Diabetes Maternal Grandmother   . Hearing loss Maternal Grandmother     left hear when she was a child  . Hyperlipidemia Maternal Grandmother   . Hypertension Maternal Grandmother   . Alcohol abuse Maternal Grandfather   . Early death Maternal Grandfather   . Cancer Paternal Aunt     breast  . Multiple sclerosis Cousin     Allergies  Allergen Reactions  . Iron Shortness Of Breath    IV only   . Tysabri [Natalizumab] Other (See Comments)    Shortness of breath, joint pain, tremors  . Delsym [Dextromethorphan Polistirex Er] Other (See Comments)    nightmares  . Hydrocodone-Guaifenesin Other (See Comments)    nightmares  . Doxycycline Other (See Comments)    Bad taste to patient    Medication list has  been reviewed and updated.  Current Outpatient Prescriptions on File Prior to Visit  Medication Sig Dispense Refill  . aspirin 81 MG tablet Take 81 mg by mouth daily.    Marland Kitchen COPAXONE 40 MG/ML SOSY Inject 40 mg into the skin. 05/25/16 first injection today at PCP's office  11  . telmisartan-hydrochlorothiazide (MICARDIS HCT) 80-12.5 MG tablet TAKE 1 TABLET BY MOUTH ONCE DAILY 30 tablet 0  . Vitamin D, Ergocalciferol, (DRISDOL) 50000 units CAPS capsule Take 50,000 Units by mouth every 7 (seven) days.     No current facility-administered medications on file prior to visit.     ROS ROS otherwise unremarkable unless listed above.  Physical Examination: BP 130/70 (BP Location: Right Arm, Cuff Size: Large)   Pulse (!) 109   Temp 98.5 F (36.9 C) (Oral)   Resp 18   Ht 5' 3.25" (1.607 m)   Wt 291 lb (132 kg)   SpO2 99%   BMI 51.14 kg/m  Ideal Body Weight: Weight in (lb) to have BMI =  25: 142  Physical Exam  Constitutional: She is oriented to person, place, and time. She appears well-developed and well-nourished. No distress.  HENT:  Head: Normocephalic and atraumatic.  Right Ear: External ear normal.  Left Ear: External ear normal.  Eyes: Conjunctivae and EOM are normal. Pupils are equal, round, and reactive to light.  Cardiovascular: Normal rate.   Pulmonary/Chest: Effort normal. No respiratory distress.  Neurological: She is alert and oriented to person, place, and time.  Skin: She is not diaphoretic.  Psychiatric: She has a normal mood and affect. Her behavior is normal.     Assessment and Plan: [name] is a 45 y.o. female who is here today for observation of infusion of copaxone.   --this was successful.  --she will continue at home therapy. Encounter for medication monitoring  Infusion reaction, initial encounter  Ivar Drape, PA-C [facility] 06/05/2016 10:07 AM

## 2016-06-10 ENCOUNTER — Ambulatory Visit (INDEPENDENT_AMBULATORY_CARE_PROVIDER_SITE_OTHER): Payer: 59 | Admitting: Physician Assistant

## 2016-06-10 VITALS — BP 142/100 | HR 106 | Temp 98.7°F | Resp 18 | Ht 63.25 in | Wt 291.0 lb

## 2016-06-10 DIAGNOSIS — I10 Essential (primary) hypertension: Secondary | ICD-10-CM

## 2016-06-10 DIAGNOSIS — G44219 Episodic tension-type headache, not intractable: Secondary | ICD-10-CM

## 2016-06-10 DIAGNOSIS — G35 Multiple sclerosis: Secondary | ICD-10-CM | POA: Diagnosis not present

## 2016-06-10 MED ORDER — KETOROLAC TROMETHAMINE 60 MG/2ML IM SOLN
60.0000 mg | Freq: Once | INTRAMUSCULAR | Status: AC
  Administered 2016-06-10: 60 mg via INTRAMUSCULAR

## 2016-06-10 MED ORDER — TELMISARTAN-HCTZ 80-12.5 MG PO TABS: 1.0000 | ORAL_TABLET | Freq: Every day | ORAL | 0 refills | Status: DC

## 2016-06-10 NOTE — Progress Notes (Signed)
[name]  MRN: [id number] DOB: 1971-08-30  Subjective:  Pt presents to clinic for headache that did not allow her to sleep last night. 3 days ago she had a bad day with a headache and weakness from her MS, then yesterday she started having aching and weakness in her right arm and then she developed a headache - this is a similar headache that she gets from her MS and it caused her to not sleep last night - it is aching in the middle of her head and she is having some light sensitivity.  She started her new MS medications 2 weeks ago and she thinks she is tolerating it ok.  She feels like this is an MS flair.  She has recently had some increased stress with her job and at home - her boyfriend moved in with her and she is not sleeping because he does not sleep at night so he turns on the TV and that disrupts her sleep.  She is also stressed because her daughter understand her and her limitations and are very supportive but her boyfriend does not understand MS and he does not seem to want to understand what she is going through and that has been hard for her and stressful for her.  She started trying to exercise for stress relief and her neurologist told her the stronger she can stay to longer she will walk but she is unable to use the treadmill because her left leg starts to get weak.  She was been encouraged to swim but she is afraid of the water.  She has found that she is short tempered lately and that is upsetting her also.    Review of Systems  Constitutional: Negative for chills and fever.  Neurological: Positive for weakness (no change from her norm with her MS) and headaches. Negative for dizziness and light-headedness.    Patient Active Problem List   Diagnosis Date Noted  . Prediabetes   . MS (multiple sclerosis) ([place])   . Obesity, Class III, BMI 40-49.9 (morbid obesity) ([place]) 10/16/2014  . Smoker 10/16/2014  . OSA on CPAP 10/16/2014  . HTN (hypertension) 01/14/2012  . Sickle  cell trait ([place]) 01/14/2012    Current Outpatient Prescriptions on File Prior to Visit  Medication Sig Dispense Refill  . aspirin 81 MG tablet Take 81 mg by mouth daily.    Marland Kitchen COPAXONE 40 MG/ML SOSY Inject 40 mg into the skin. 05/25/16 first injection today at PCP's office  11  . Vitamin D, Ergocalciferol, (DRISDOL) 50000 units CAPS capsule Take 50,000 Units by mouth every 7 (seven) days.     No current facility-administered medications on file prior to visit.     Allergies  Allergen Reactions  . Iron Shortness Of Breath    IV only   . Tysabri [Natalizumab] Other (See Comments)    Shortness of breath, joint pain, tremors  . Delsym [Dextromethorphan Polistirex Er] Other (See Comments)    nightmares  . Hydrocodone-Guaifenesin Other (See Comments)    nightmares  . Doxycycline Other (See Comments)    Bad taste to patient    Objective:  BP (!) 142/100   Pulse (!) 106   Temp 98.7 F (37.1 C) (Oral)   Resp 18   Ht 5' 3.25" (1.607 m)   Wt 291 lb (132 kg)   LMP 05/26/2016   SpO2 98%   BMI 51.14 kg/m   Physical Exam  Constitutional: She is oriented to person, place, and time  and well-developed, well-nourished, and in no distress.  HENT:  Head: Normocephalic and atraumatic.  Right Ear: Hearing and external ear normal.  Left Ear: Hearing and external ear normal.  Eyes: Conjunctivae are normal.  Neck: Normal range of motion.  Cardiovascular: Normal rate, regular rhythm and normal heart sounds.   No murmur heard. Pulmonary/Chest: Effort normal and breath sounds normal. She has no wheezes.  Neurological: She is alert and oriented to person, place, and time. She has normal sensation, normal reflexes and intact cranial nerves. Gait normal.  Slight decrease in strength but normal for patient  Skin: Skin is warm and dry.  Psychiatric: Mood, memory, affect and judgment normal.  Vitals reviewed.   Assessment and Plan :  Essential hypertension - Plan: telmisartan-hydrochlorothiazide  (MICARDIS HCT) 80-12.5 MG tablet - gave refill of HTN meds - she will check her BP when she is feeling well as I think her HA and her decrease recent sleep has caused an elevation in her BP today.  MS (multiple sclerosis) (HCC) -   Episodic tension-type headache, not intractable - Plan: ketorolac (TORADOL) injection 60 mg   Stress - have her a simple HO for her boyfriend to read about MS - we discussed removing the TV from the bedroom so he does not disrupt her sleep as sleep is importance for both her MS as well as her mental health and without it I expect to have more problems - she has decreased her hours at work because she cannot do it and she is thinking that she might have to decrease them more than what she is currently doing because of fatigue.   Windell Hummingbird PA-C  [facility] 06/10/2016 10:44 AM

## 2016-06-10 NOTE — Patient Instructions (Addendum)
     IF you received an x-ray today, you will receive an invoice from [facility]. Please contact [facility] at [phone] with questions or concerns regarding your invoice.   IF you received labwork today, you will receive an invoice from [employer]. Please contact [facility] at [phone] with questions or concerns regarding your invoice.   Our billing staff will not be able to assist you with questions regarding bills from these companies.  You will be contacted with the lab results as soon as they are available. The fastest way to get your results is to activate your My Chart account. Instructions are located on the last page of this paperwork. If you have not heard from [place] regarding the results in 2 weeks, please contact this office.    Multiple Sclerosis Multiple sclerosis (MS) is a disease of the central nervous system. It leads to the loss of the insulating covering of the nerves (myelin sheath) of your brain. When this happens, brain signals do not get sent properly or may not get sent at all. The age of onset of MS varies.  CAUSES The cause of MS is unknown. However, it is more common in the [place] than in the [place]. RISK FACTORS There is a higher number of women with MS than men. MS is not an illness that is passed down to you from your family members (inherited). However, your risk of MS is higher if you have a relative with MS. SIGNS AND SYMPTOMS  The symptoms of MS occur in episodes or attacks. These attacks may last weeks to months. There may be long periods of almost no symptoms between attacks. The symptoms of MS vary. This is because of the many different ways it affects the central nervous system. The main symptoms of MS include:  Vision problems and eye pain.  Numbness.  Weakness.  Inability to move your arms, hands, feet, or legs (paralysis).  Balance problems.  Tremors. DIAGNOSIS   Your health care provider can diagnose MS with the help of imaging exams and lab tests. These may include specialized X-ray exams and spinal fluid tests. The best imaging exam to confirm a diagnosis of MS is an MRI. TREATMENT  There is no known cure for MS, but there are medicines that can decrease the number and frequency of attacks. Steroids are often used for short-term relief. Physical and occupational therapy may also help. There are also many new alternative or complementary treatments available to help control the symptoms of MS. Ask your health care provider if any of these other options are right for you. HOME CARE INSTRUCTIONS   Take medicines as directed by your health care provider.  Exercise as directed by your health care provider. SEEK MEDICAL CARE IF: You begin to feel depressed. SEEK IMMEDIATE MEDICAL CARE IF:  You develop paralysis.  You have problems with bladder, bowel, or sexual function.  You develop mental changes, such as forgetfulness or mood swings.  You have a period of uncontrolled movements (seizure).   This information is not intended to replace advice given to you by your health care provider. Make sure you discuss any questions you have with your health care provider.   Document Released: 10/28/2000 Document Revised: 11/05/2013 Document Reviewed: 07/08/2013 Elsevier Interactive Patient Education [employer].

## 2016-06-22 DIAGNOSIS — G4733 Obstructive sleep apnea (adult) (pediatric): Secondary | ICD-10-CM | POA: Diagnosis not present

## 2016-07-01 ENCOUNTER — Ambulatory Visit (HOSPITAL_COMMUNITY): Admission: RE | Admit: 2016-07-01 | Payer: 59 | Source: Ambulatory Visit

## 2016-07-03 ENCOUNTER — Ambulatory Visit (HOSPITAL_COMMUNITY): Admission: RE | Admit: 2016-07-03 | Payer: 59 | Source: Ambulatory Visit

## 2016-07-09 ENCOUNTER — Ambulatory Visit (HOSPITAL_COMMUNITY)
Admission: RE | Admit: 2016-07-09 | Discharge: 2016-07-09 | Disposition: A | Discharge status: Home / Self Care | Payer: 59 | Source: Ambulatory Visit | Attending: Diagnostic Neuroimaging | Admitting: Diagnostic Neuroimaging

## 2016-07-09 DIAGNOSIS — G379 Demyelinating disease of central nervous system, unspecified: Secondary | ICD-10-CM | POA: Insufficient documentation

## 2016-07-09 DIAGNOSIS — R9082 White matter disease, unspecified: Secondary | ICD-10-CM | POA: Diagnosis not present

## 2016-07-09 DIAGNOSIS — G35 Multiple sclerosis: Secondary | ICD-10-CM | POA: Insufficient documentation

## 2016-07-09 DIAGNOSIS — D329 Benign neoplasm of meninges, unspecified: Secondary | ICD-10-CM | POA: Diagnosis not present

## 2016-07-09 LAB — POCT I-STAT CREATININE: CREATININE: 0.9 mg/dL (ref 0.44–1.00)

## 2016-07-09 MED ORDER — GADOBENATE DIMEGLUMINE 529 MG/ML IV SOLN
20.0000 mL | Freq: Once | INTRAVENOUS | Status: AC | PRN
  Administered 2016-07-09: 20 mL via INTRAVENOUS

## 2016-07-13 DIAGNOSIS — G4733 Obstructive sleep apnea (adult) (pediatric): Secondary | ICD-10-CM | POA: Diagnosis not present

## 2016-07-14 ENCOUNTER — Telehealth: Payer: Self-pay | Admitting: Neurology

## 2016-07-14 NOTE — Telephone Encounter (Addendum)
Addendum to previous note: patient stated she is at work today, is feeling better, but she inquired about anything else she can do. Advised her to be sure she is well hydrated and when she goes home, to try and stay in a position most comfortable. She stated she is still slightly nauseated, inquired if she should ask for medication for nausea from her employer,  [employer]. Advised that if she feels she needs medication she can request. She inquired about her MRI results; advised her report is in but Dr Leta Baptist needs to review. Informed her this RN will discuss with Dr Leta Baptist her concerns and MRI result and call her back no later than tomorrow before this office closes. Offered to schedule earlier FU, but she stated she will wait to hear back from this call first. She verbalized understanding, appreciation of call.

## 2016-07-14 NOTE — Telephone Encounter (Signed)
Spoke with patient and gave her Dr Gladstone Lighter detailed message, reply. She stated she would rather keep her current follow up 08/05/16 because she has the day off already. Advised her to get medication for nausea at her PCP if she feels she needs it and to call back for any needs, concerns. Reminded her this office has a dr on call over the weekend if she needs the service. She verbalized understanding,appreciaiton of call back.

## 2016-07-14 NOTE — Telephone Encounter (Signed)
LVM requesting patient call this RN back with her status update. Reminded her she has FU on 08/05/16 which can be moved to a sooner date. Left name, number and requested she call back today.

## 2016-07-14 NOTE — Telephone Encounter (Signed)
Patient returned [facility] call, please call [phone] until 4:30pm, after 4:30 call cell.

## 2016-07-14 NOTE — Telephone Encounter (Signed)
LVM requesting call back to answer her questions. Left name, number.

## 2016-07-14 NOTE — Telephone Encounter (Signed)
Patient called after hours call center around 10:18 PM last night and I called her back at the time. She reported vertigo symptoms when laying on her left side which caused her to feel nauseated and unable to lay down without having symptoms. She was sitting up and felt better. I advised her that it is difficult to say for sure if this was a MS flare up or actual positional peripheral vertigo. I explained to her that it would be best if I forward her information and concerns to her treating neurologist, she was advised in the interim overnight to gauge whether she can sleep slightly upright or turned to the right side and I would check with Dr. Mamie Nick. And his nurse if she needs to come in for a sooner appointment. I also told her if she were to feel suddenly worse overnight, she should go ahead and proceed to the emergency room (daughter would then take her).

## 2016-07-14 NOTE — Telephone Encounter (Signed)
No acute plaques. 1 new plaque has developed in the last 1 year. Can discuss continuing copaxone or changing therapy at next office visit.   In terms of new symptoms, she should get eval'd at PCP office and can also come in to our office sooner for work-in visit with me.   Penni Bombard, MD [id number], [id number] PM Certified in Neurology, Neurophysiology and Neuroimaging  [facility] [address] [place], [place] [address] [phone]

## 2016-07-14 NOTE — Telephone Encounter (Signed)
Received call back from patient. She stated she is feeling much better.

## 2016-07-19 ENCOUNTER — Telehealth: Payer: Self-pay | Admitting: *Deleted

## 2016-07-19 ENCOUNTER — Telehealth: Payer: Self-pay

## 2016-07-19 ENCOUNTER — Other Ambulatory Visit: Payer: Self-pay | Admitting: Emergency Medicine

## 2016-07-19 MED ORDER — ALPRAZOLAM 0.5 MG PO TABS: ORAL_TABLET | ORAL | 1 refills | Status: DC

## 2016-07-19 NOTE — Telephone Encounter (Signed)
Patient previously called: Spoke with patient and gave her Dr Gladstone Lighter detailed message, reply. She stated she would rather keep her current follow up 08/05/16 because she has the day off already. Advised her to get medication for nausea at her PCP if she feels she needs it and to call back for any needs, concerns. Reminded her this office has a dr on call over the weekend if she needs the service. She verbalized understanding,appreciaiton of call back.

## 2016-07-19 NOTE — Telephone Encounter (Signed)
Dr. Everlene Farrier, pt would like a RX for Xanax to my pharmacy. Pt's mom passed on 06-Mar-2023 and needs something please. Please advise.

## 2016-07-29 ENCOUNTER — Ambulatory Visit (INDEPENDENT_AMBULATORY_CARE_PROVIDER_SITE_OTHER): Payer: 59 | Admitting: Physician Assistant

## 2016-07-29 VITALS — BP 142/84 | HR 80 | Temp 98.3°F | Resp 20 | Ht 63.0 in | Wt 289.4 lb

## 2016-07-29 DIAGNOSIS — G44209 Tension-type headache, unspecified, not intractable: Secondary | ICD-10-CM

## 2016-07-29 MED ORDER — KETOROLAC TROMETHAMINE 60 MG/2ML IM SOLN
60.0000 mg | Freq: Once | INTRAMUSCULAR | Status: AC
  Administered 2016-07-29: 60 mg via INTRAMUSCULAR

## 2016-07-29 NOTE — Patient Instructions (Signed)
     IF you received an x-ray today, you will receive an invoice from [facility]. Please contact [facility] at [phone] with questions or concerns regarding your invoice.   IF you received labwork today, you will receive an invoice from [facility]. Please contact [facility] at [phone] with questions or concerns regarding your invoice.   Our billing staff will not be able to assist you with questions regarding bills from these companies.  You will be contacted with the lab results as soon as they are available. The fastest way to get your results is to activate your My Chart account. Instructions are located on the last page of this paperwork. If you have not heard from us regarding the results in 2 weeks, please contact this office.      

## 2016-07-29 NOTE — Progress Notes (Signed)
07/29/2016 10:52 AM   DOB: [birth year] / MRN: [id number]  SUBJECTIVE:  [name] is a 45 y.o. female presenting for frontal-temporal HA that started last night and is worsening.  She denies weakness, vision changes, presyncope and paresthesia.  Has tried 1000 mg of tylenol and this did not help.  She feels the HA is worsening.  She has a history of HTN and is not missing doses of her medication.  She typically receives Toradol when she has a persistent HA and this always works.  She would like Toradol today. She has a history of MS managed by neurology.    She is allergic to iron; tysabri [natalizumab]; delsym [dextromethorphan polistirex er]; hydrocodone-guaifenesin; and doxycycline.   She  has a past medical history of Allergy; Anemia; Anxiety; Clotting disorder ([place]); Depression; Hypertension; Iron overload, transfusional; Migraine; OSA on CPAP; Sickle cell anemia ([place]); Sickle cell trait ([place]); Smoker; and Tachycardia.    She  reports that she has been smoking Cigarettes.  She has a 6.25 pack-year smoking history. She has never used smokeless tobacco. She reports that she drinks alcohol. She reports that she does not use drugs. She  reports that she currently engages in sexual activity and has had female partners. She reports using the following method of birth control/protection: Condom. The patient  has a past surgical history that includes Cervical ablation; Tubal ligation; Cholecystectomy; and Cesarean section.  Her family history includes ADD / ADHD in her son; Alcohol abuse in her maternal grandfather; Arthritis in her maternal grandmother; Cancer in her father, paternal aunt, and paternal aunt; Diabetes in her father and maternal grandmother; Early death in her maternal grandfather; Hearing loss in her maternal grandmother; Hyperlipidemia in her maternal grandmother and mother; Hypertension in her maternal grandmother; Hyperthyroidism in her mother; Mental illness in her mother; Mental  retardation in her mother; Multiple sclerosis in her cousin.  Review of Systems  Constitutional: Negative for chills and fever.  HENT: Negative for congestion, hearing loss, nosebleeds, sore throat and tinnitus.   Respiratory: Negative for cough.   Cardiovascular: Negative for chest pain.  Musculoskeletal: Negative for myalgias and neck pain.  Skin: Negative for itching and rash.  Neurological: Positive for headaches. Negative for dizziness, tingling, tremors, sensory change, speech change, focal weakness, seizures and loss of consciousness.    The problem list and medications were reviewed and updated by myself where necessary and exist elsewhere in the encounter.   OBJECTIVE:  BP (!) 142/84   Pulse 80   Temp 98.3 F (36.8 C) (Oral)   Resp 20   Ht 5\' 3"  (1.6 m)   Wt 289 lb 6.4 oz (131.3 kg)   SpO2 99%   BMI 51.26 kg/m   Physical Exam  Constitutional: She is oriented to person, place, and time. She appears well-developed and well-nourished. No distress.  Eyes: Pupils are equal, round, and reactive to light.  Cardiovascular: Normal rate and regular rhythm.   Pulmonary/Chest: Effort normal and breath sounds normal.  Musculoskeletal: Normal range of motion. She exhibits no edema or tenderness.  Neurological: She is alert and oriented to person, place, and time. She displays normal reflexes. No cranial nerve deficit. Coordination normal.  Skin: Skin is warm and dry. No rash noted. She is not diaphoretic. No erythema. No pallor.  Psychiatric: She has a normal mood and affect. Her behavior is normal. Judgment and thought content normal.  Vitals reviewed.   No results found for this or any previous visit (from the past  72 hour(s)).  No results found.  ASSESSMENT AND PLAN  [name] was seen today for headache.  Diagnoses and all orders for this visit:  Tension-type headache, not intractable, unspecified chronicity pattern -     ketorolac (TORADOL) injection 60 mg; Inject 2 mLs  (60 mg total) into the muscle once.    The patient is advised to call or return to clinic if she does not see an improvement in symptoms, or to seek the care of the closest emergency department if she worsens with the above plan.   Philis Fendt, MHS, PA-C [facility] 07/29/2016 10:52 AM

## 2016-08-05 ENCOUNTER — Encounter: Payer: Self-pay | Admitting: Diagnostic Neuroimaging

## 2016-08-05 ENCOUNTER — Ambulatory Visit (INDEPENDENT_AMBULATORY_CARE_PROVIDER_SITE_OTHER): Payer: 59 | Admitting: Diagnostic Neuroimaging

## 2016-08-05 VITALS — BP 155/97 | HR 101 | Ht 63.0 in | Wt 289.6 lb

## 2016-08-05 DIAGNOSIS — Z9989 Dependence on other enabling machines and devices: Secondary | ICD-10-CM

## 2016-08-05 DIAGNOSIS — R5383 Other fatigue: Secondary | ICD-10-CM | POA: Diagnosis not present

## 2016-08-05 DIAGNOSIS — G35 Multiple sclerosis: Secondary | ICD-10-CM | POA: Diagnosis not present

## 2016-08-05 DIAGNOSIS — G43009 Migraine without aura, not intractable, without status migrainosus: Secondary | ICD-10-CM | POA: Diagnosis not present

## 2016-08-05 DIAGNOSIS — G4733 Obstructive sleep apnea (adult) (pediatric): Secondary | ICD-10-CM

## 2016-08-05 MED ORDER — RIZATRIPTAN BENZOATE 10 MG PO TBDP: 10.0000 mg | ORAL_TABLET | ORAL | 11 refills | Status: DC | PRN

## 2016-08-05 MED ORDER — TOPIRAMATE 50 MG PO TABS: 50.0000 mg | ORAL_TABLET | Freq: Two times a day (BID) | ORAL | 12 refills | Status: DC

## 2016-08-05 NOTE — Progress Notes (Signed)
[facility]  PATIENT: [name] DOB: 12/13/1970  REFERRING CLINICIAN:  HISTORY FROM: patient REASON FOR VISIT: follow up   HISTORICAL  CHIEF COMPLAINT:  Chief Complaint  Patient presents with  . Multiple Sclerosis    She has noticed mild weakness in her right arm for the last weeks. She would like to review her MRI results.    HISTORY OF PRESENT ILLNESS:   UPDATE 08/05/16: Since last visit, has transitioned to copaxone since Aug 2017. MRI brain reviewed. Having some intermittent HA, esp with weather changes. HA --> bitemporal, retro-orbital, aching, + photophobia, - phonophobia, -N/V; last for 1-2 days; happening 1 per week. These HA have been present since 2014. Never been on preventative meds. Has tried maxalt in 2014 with some benefit.  UPDATE 04/29/16: Since last visit, had possible infusion reaction on 03/30/16 here at infusion center (shaking, chills, malaise), in setting of URI and recently starting z-pack. Given benedryl and solumedrol, then went to ER for observation. Had full recovery. This past week, had tysabri infusion last Wednesday (04/27/16) and was doing well initially, until she developed joint pain (knees, shoulder). Then infusion stopped. Then patient returned home, then developed shaking chills, and went to urgent care for observation. Now doing well. Here to discuss other MS med options.  UPDATE 01/22/16: Since last visit, doing well. Some fatigue on busy work days (Mon and Fri). No new neuro sxs. Tolerating tysabri. Some joint aches pains in arms and wrists.   UPDATE 09/07/15: Since last visit, doing well. Tolerating tysabri. No new events neurologically. Some more leg swelling lately, esp with sitting for long time at work. 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  UPDATE 06/02/15: Doing well. Strength good. No numbness. Fatigue better. Concentration is better. Intermittent word  finding diff (scanning speech). Tolerating tysabri infusions. 3 weeks ago noted fine small bumps on arms, mild itching. Some int headaches in temporal regions, mild.   UPDATE 03/03/15: Since last visit, more right hip and low back pain. Intermittent. Had tysabri on March 14 and Apr 11. Overall c/o fatigue, focusing issues. 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  UPDATE 12/01/14: Since last visit, left side weakness has improved. Now having more problems with right knee pain, esp walking longer distances. Awaiting tysabri scheduling.   PRIOR HPI (10/29/14): 45 year old right-handed female here for evaluation of multiple sclerosis. 10/15/14 patient developed staggering to the left side. The next day she noted left arm and left leg heaviness and weakness. Patient was in the hospital and had evaluation. MRI of the brain demonstrated multiple brain lesions suspicious for demyelinating disease. Patient was treated with IV steroids and her left sided symptoms have almost completely resolved. Patient continues to have a slight limp with left leg difficulty. Also has some slight incoordination with left hand. In the past patient has had intermittent headaches. Patient has also had intermittent episodes of knee pain and intermittent vertigo attacks. Patient has family history of multiple sclerosis in maternal uncle and maternal cousin.    REVIEW OF SYSTEMS: Full 14 system review of systems performed and negative except: rash aching muscles headache cramps.   ALLERGIES: Allergies  Allergen Reactions  . Iron Shortness Of Breath    IV only   . Tysabri [Natalizumab] Other (See Comments)    Shortness of breath, joint pain, tremors  . Delsym [Dextromethorphan Polistirex Er] Other (See Comments)    nightmares  .  Hydrocodone-Guaifenesin Other (See Comments)    nightmares  . Doxycycline Other (See Comments)    Bad taste to patient    HOME MEDICATIONS: Outpatient Medications Prior to Visit  Medication Sig  Dispense Refill  . aspirin 81 MG tablet Take 81 mg by mouth daily.    Marland Kitchen COPAXONE 40 MG/ML SOSY Inject 40 mg into the skin. 05/25/16 first injection today at PCP's office  11  . telmisartan-hydrochlorothiazide (MICARDIS HCT) 80-12.5 MG tablet Take 1 tablet by mouth daily. 90 tablet 0  . Vitamin D, Ergocalciferol, (DRISDOL) 50000 units CAPS capsule Take 50,000 Units by mouth every 7 (seven) days.    . ALPRAZolam (XANAX) 0.5 MG tablet Take 1 tablet every 6-8 hours as needed for stress 20 tablet 1   No facility-administered medications prior to visit.     PAST MEDICAL HISTORY: Past Medical History:  Diagnosis Date  . Allergy   . Anemia   . Anxiety   . Clotting disorder ([place])    from beeing anemic  . Depression   . Hypertension   . Iron overload, transfusional   . Migraine   . OSA on CPAP   . Sickle cell anemia (H[facility])    trait  . Sickle cell trait ([place])   . Smoker   . Tachycardia     PAST SURGICAL HISTORY: Past Surgical History:  Procedure Laterality Date  . CERVICAL ABLATION    . CESAREAN SECTION     3 times  . CHOLECYSTECTOMY    . TUBAL LIGATION      FAMILY HISTORY: Family History  Problem Relation Age of Onset  . Mental illness Mother   . Hyperlipidemia Mother   . Hyperthyroidism Mother   . Mental retardation Mother   . ADD / ADHD Son   . Diabetes Father   . Cancer Father   . Cancer Paternal Aunt     breast  . Arthritis Maternal Grandmother   . Diabetes Maternal Grandmother   . Hearing loss Maternal Grandmother     left hear when she was a child  . Hyperlipidemia Maternal Grandmother   . Hypertension Maternal Grandmother   . Alcohol abuse Maternal Grandfather   . Early death Maternal Grandfather   . Cancer Paternal Aunt     breast  . Multiple sclerosis Cousin     SOCIAL HISTORY:  Social History   Social History  . Marital status: Single    Spouse name: N/A  . Number of children: 3  . Years of education: 12+   Occupational History  . CMA      [employer]    Social History Main Topics  . Smoking status: Current Every Day Smoker    Packs/day: 0.25    Years: 25.00    Types: Cigarettes    Last attempt to quit: 10/16/2014  . Smokeless tobacco: Never Used     Comment: smoke 5 cigarettes/day or prn  . Alcohol use 0.0 oz/week     Comment: once maybe a month if that  . Drug use: No  . Sexual activity: Yes    Partners: Male    Birth control/ protection: Condom   Other Topics Concern  . Not on file   Social History Narrative   Patient lives at home with her family.   Caffeine Use: 1 cup daily     PHYSICAL EXAM  Vitals:   08/05/16 0831  BP: (!) 155/97  Pulse: (!) 101  Weight: 289 lb 9.6 oz (131.4 kg)  Height: 5\' 3"  (  1.6 m)   Wt Readings from Last 3 Encounters:  08/05/16 289 lb 9.6 oz (131.4 kg)  07/29/16 289 lb 6.4 oz (131.3 kg)  06/10/16 291 lb (132 kg)   Body mass index is 51.3 kg/m.   Visual Acuity Screening   Right eye Left eye Both eyes  Without correction: 20/20-1 20/30-1   With correction:       No flowsheet data found.  GENERAL EXAM: Patient is in no distress; well developed, nourished and groomed; neck is supple  CARDIOVASCULAR: Regular rate and rhythm, no murmurs, no carotid bruits  NEUROLOGIC: MENTAL STATUS: awake, alert, language fluent, comprehension intact, naming intact, fund of knowledge appropriate CRANIAL NERVE: pupils equal and reactive to light, visual fields full to confrontation, extraocular muscles intact, no nystagmus, facial sensation and strength symmetric, hearing intact, palate elevates symmetrically, uvula midline, shoulder shrug symmetric, tongue midline. MOTOR: normal bulk and tone, full strength in the BUE, BLE SENSORY: normal and symmetric to light touch COORDINATION: finger-nose-finger, fine finger movements normal REFLEXES: deep tendon reflexes present and symmetric GAIT/STATION: narrow based gait; STABLE, SMOOTH     DIAGNOSTIC DATA (LABS, IMAGING, TESTING) - I reviewed  patient records, labs, notes, testing and imaging myself where available.  Lab Results  Component Value Date   WBC 12.3 (H) 04/29/2016   HGB 11.5 (A) 03/28/2016   HCT 36.9 04/29/2016   MCV 72 (L) 04/29/2016   PLT 295 04/29/2016      Component Value Date/Time   NA 142 04/29/2016 0918   K 4.5 04/29/2016 0918   CL 103 04/29/2016 0918   CO2 25 04/29/2016 0918   GLUCOSE 96 04/29/2016 0918   GLUCOSE 100 (H) 11/11/2015 0828   BUN 11 04/29/2016 0918   CREATININE 0.90 07/09/2016 0806   CREATININE 0.75 11/11/2015 0828   CALCIUM 10.3 (H) 04/29/2016 0918   PROT 6.7 04/29/2016 0918   ALBUMIN 3.9 04/29/2016 0918   AST 13 04/29/2016 0918   ALT 18 04/29/2016 0918   ALKPHOS 87 04/29/2016 0918   BILITOT <0.2 04/29/2016 0918   GFRNONAA 87 04/29/2016 0918   GFRNONAA >89 11/11/2015 0828   GFRAA 101 04/29/2016 0918   GFRAA >89 11/11/2015 0828   Lab Results  Component Value Date   CHOL 166 11/11/2015   HDL 37 (L) 11/11/2015   LDLCALC 105 11/11/2015   TRIG 119 11/11/2015   CHOLHDL 4.5 11/11/2015   Lab Results  Component Value Date   HGBA1C 6.0 11/11/2015   No results found for: [id number] Lab Results  Component Value Date   TSH 0.904 12/12/2013   Vit D, 25-Hydroxy  Date Value Ref Range Status  01/22/2016 25.3 (L) 30.0 - 100.0 ng/mL Final    Comment:    Vitamin D deficiency has been defined by the [place] and an Endocrine Society practice guideline as a level of serum 25-OH vitamin D less than 20 ng/mL (1,2). The Endocrine Society went on to further define vitamin D insufficiency as a level between 21 and 29 ng/mL (2). 1. IOM (Institute of Medicine). 2010. Dietary reference    intakes for calcium and D. [place]: The    [employer]. 2. Holick MF, Binkley [place], Bischoff-Ferrari HA, et al.    Evaluation, treatment, and prevention of vitamin D    deficiency: an Endocrine Society clinical practice    guideline. JCEM. 2011 Jul; 96(7):1911-30.      10/17/14 MRI brain (without)  1. Multiple T2/FLAIR hyperintense lesions with associated restricted diffusion involving the white  matter of both cerebral hemispheres, right greater than left, most consistent with active demyelinating disease. 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 Follow-up examination with postcontrast imaging could be performed for complete evaluation. 2. No other findings to suggest acute intracranial infarct or other process.  10/17/14 MRI cervical spine (with and without)  - Motion degraded exam demonstrating no convincing evidence for spinal MS.  Certainly there are no areas of T2 hyperintensity or cord enlargement. No disc protrusion or spinal stenosis.  10/18/14 MRI brain (with) - Findings consistent with acute demyelination in the RIGHT posterior frontal subcortical white matter in this patient with suspected multiple sclerosis. Multiple other areas of T2 signal hyperintensity on the previous study do not display similar postcontrast enhancement. 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  07/08/15 MRI brain [I reviewed images myself and agree with interpretation. -VRP]  1. Regression of multiple sclerosis since December 2015. No active demyelination. 2. Tiny 5 mm right posterior convexity meningioma versus benign dural thickening. This is stable and appears inconsequential.  07/09/16 MRI brain [I reviewed images myself and agree with interpretation. -VRP]  - Progression of white matter hyperintensity in the right temporoparietal lobe consistent with progressive demyelinating disease. New area of demyelination in the left posterior frontal lobe is small measuring 5 mm. No evidence of acute  demyelinization. - Stable small posterior right parietal meningioma.  10/21/14 CSF opening pressure 19cm H2O - (WBC 18, RBC 3545, protein 50, glucose 110, cryptococcal ag negative, IgG index 7.1 (normal), OCB > 5 well defined gamma restriction bands)  10/29/14 JCV ab - negative  08/28/15 JCV ab - negative     ASSESSMENT AND PLAN  45 y.o. 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 Was on tysabri since Jan 2016 and doing very well, until 2 infusion reactions in May 2017 and Jun 2017.    Dx:  MS (multiple sclerosis) ([place])  Other fatigue  OSA on CPAP  Migraine without aura and without status migrainosus, not intractable     PLAN: - for multiple sclerosis -->  - continue copaxone; may consider gilenya or tecfidera in future - continue physical activity, PT exercises  - for migraine headaches -->   - start topiramate + rizatriptan  - for joint aches/pains --> - consider low dose fish oil capsule (1-2 per day OTC) for joint pains and anti-inflammatory effect - consider occasional ibuprofen as needed for joint pain - consider yoga or water therapy - reduce sugar / carbohydrate intake  Meds ordered this encounter  Medications  . topiramate (TOPAMAX) 50 MG tablet    Sig: Take 1 tablet (50 mg total) by mouth 2 (two) times daily.    Dispense:  60 tablet    Refill:  12  . rizatriptan (MAXALT-MLT) 10 MG disintegrating tablet    Sig: Take 1 tablet (10 mg total) by mouth as needed for migraine. May repeat in 2 hours if needed    Dispense:  9 tablet    Refill:  11   Orders Placed This Encounter  Procedures  . CBC with Differential/Platelet  . CMP  . VITAMIN D 25 Hydroxy (Vit-D Deficiency, Fractures)   Return  in about 3 months (around 11/04/2016).  I reviewed images, labs,  notes, records myself. I summarized findings and reviewed with patient, for this high risk condition (multiple sclerosis) requiring high complexity decision making.      Penni Bombard, MD [id number], [id number] AM Certified in Neurology, Neurophysiology and Neuroimaging  [facility] [address] [place], [place] [address] [phone]

## 2016-08-05 NOTE — Patient Instructions (Signed)
Thank you for coming to see [place] at  [facility]. I hope we have been able to provide you high quality care today.  You may receive a patient satisfaction survey over the next few weeks. We would appreciate your feedback and comments so that we may continue to improve ourselves and the health of our patients.  MULTIPLE SCLEROSIS TREATMENT - continue copaxone  MIGRAINE PREVENTION - start topiramate '50mg'$  at bedtime; after 1 week increase to twice a day; drink plenty of water  MIGRAINE RESCUE - rizatriptan '10mg'$  as needed for breakthrough headache; may repeat x 1 after 2 hours; max 2 tabs per day or 8 per month    ~~~~~~~~~~~~~~~~~~~~~~~~~~~~~~~~~~~~~~~~~~~~~~~~~~~~~~~~~~~~~~~~~  DR. Vincent Ehrler'S GUIDE TO HAPPY AND HEALTHY LIVING These are some of my general health and wellness recommendations. Some of them may apply to you better than others. Please use common sense as you try these suggestions and feel free to ask me any questions.   ACTIVITY/FITNESS Mental, social, emotional and physical stimulation are very important for brain and body health. Try learning a new activity (arts, music, language, sports, games).  Keep moving your body to the best of your abilities. You can do this at home, inside or outside, the park, community center, gym or anywhere you like. Consider a physical therapist or personal trainer to get started. Consider the app Sworkit. Fitness trackers such as smart-watches, smart-phones or Fitbits can help as well.   NUTRITION Eat more plants: colorful vegetables, nuts, seeds and berries.  Eat less sugar, salt, preservatives and processed foods.  Avoid toxins such as cigarettes and alcohol.  Drink water when you are thirsty. Warm water with a slice of lemon is an excellent morning drink to start the day.  Consider these websites for more information The Nutrition Source (https://www.henry-hernandez.biz/) Precision Nutrition  (WindowBlog.ch)   RELAXATION Consider practicing mindfulness meditation or other relaxation techniques such as deep breathing, prayer, yoga, tai chi, massage. See website mindful.org or the apps Headspace or Calm to help get started.   SLEEP Try to get at least 7-8+ hours sleep per day. Regular exercise and reduced caffeine will help you sleep better. Practice good sleep hygeine techniques. See website sleep.org for more information.   PLANNING Prepare estate planning, living will, healthcare POA documents. Sometimes this is best planned with the help of an attorney. Theconversationproject.org and agingwithdignity.org are excellent resources.

## 2016-08-06 LAB — COMPREHENSIVE METABOLIC PANEL
ALBUMIN: 4 g/dL (ref 3.5–5.5)
ALK PHOS: 98 IU/L (ref 39–117)
ALT: 17 IU/L (ref 0–32)
AST: 16 IU/L (ref 0–40)
Albumin/Globulin Ratio: 1.3 (ref 1.2–2.2)
BUN/Creatinine Ratio: 11 (ref 9–23)
BUN: 9 mg/dL (ref 6–24)
Bilirubin Total: <0.2 mg/dL (ref 0.0–1.2)
CO2: 22 mmol/L (ref 18–29)
CREATININE: 0.81 mg/dL (ref 0.57–1.00)
Calcium: 10.5 mg/dL — ABNORMAL HIGH (ref 8.7–10.2)
Chloride: 102 mmol/L (ref 96–106)
GFR calc Af Amer: 101 mL/min/{1.73_m2} (ref >59)
GFR calc non Af Amer: 87 mL/min/{1.73_m2} (ref >59)
GLUCOSE: 94 mg/dL (ref 65–99)
Globulin, Total: 3 g/dL (ref 1.5–4.5)
Potassium: 4.5 mmol/L (ref 3.5–5.2)
Sodium: 140 mmol/L (ref 134–144)
Total Protein: 7 g/dL (ref 6.0–8.5)

## 2016-08-06 LAB — VITAMIN D 25 HYDROXY (VIT D DEFICIENCY, FRACTURES): VIT D 25 HYDROXY: 21.3 ng/mL — AB (ref 30.0–100.0)

## 2016-08-06 LAB — CBC WITH DIFFERENTIAL/PLATELET
BASOS ABS: 0 10*3/uL (ref 0.0–0.2)
Basos: 0 %
EOS (ABSOLUTE): 0.3 10*3/uL (ref 0.0–0.4)
Eos: 3 %
HEMOGLOBIN: 11.3 g/dL (ref 11.1–15.9)
Hematocrit: 36.4 % (ref 34.0–46.6)
IMMATURE GRANULOCYTES: 0 %
Immature Grans (Abs): 0 10*3/uL (ref 0.0–0.1)
LYMPHS ABS: 2.2 10*3/uL (ref 0.7–3.1)
Lymphs: 22 %
MCH: 22.2 pg — ABNORMAL LOW (ref 26.6–33.0)
MCHC: 31 g/dL — AB (ref 31.5–35.7)
MCV: 72 fL — ABNORMAL LOW (ref 79–97)
MONOCYTES: 4 %
Monocytes Absolute: 0.4 10*3/uL (ref 0.1–0.9)
NEUTROS PCT: 71 %
Neutrophils Absolute: 7.3 10*3/uL — ABNORMAL HIGH (ref 1.4–7.0)
Platelets: 285 10*3/uL (ref 150–379)
RBC: 5.08 x10E6/uL (ref 3.77–5.28)
RDW: 17.9 % — AB (ref 12.3–15.4)
WBC: 10.3 10*3/uL (ref 3.4–10.8)

## 2016-08-12 ENCOUNTER — Telehealth: Payer: Self-pay | Admitting: *Deleted

## 2016-08-12 NOTE — Telephone Encounter (Signed)
Per Dr Leta Baptist,  LVM informing patient her labs stable. Advised she has borderline anemia, a slightly high calcium, and low Vit D, but overall stable. Advised per Dr Leta Baptist to continue on vitamin D 2000 units per day.  Her record indicates she takes Vit D 50000 units weekly, but advised she continue with vit D. Left number, advised office is now closed, opens Monday at 8 am.

## 2016-08-17 ENCOUNTER — Encounter: Payer: Self-pay | Admitting: *Deleted

## 2016-08-22 ENCOUNTER — Ambulatory Visit (INDEPENDENT_AMBULATORY_CARE_PROVIDER_SITE_OTHER): Payer: 59 | Admitting: Family Medicine

## 2016-08-22 ENCOUNTER — Encounter: Payer: Self-pay | Admitting: Family Medicine

## 2016-08-22 VITALS — BP 157/103 | HR 114 | Temp 98.0°F | Resp 16

## 2016-08-22 DIAGNOSIS — I10 Essential (primary) hypertension: Secondary | ICD-10-CM | POA: Diagnosis not present

## 2016-08-22 DIAGNOSIS — N760 Acute vaginitis: Secondary | ICD-10-CM | POA: Diagnosis not present

## 2016-08-22 LAB — POCT WET PREP WITH KOH
KOH Prep POC: NEGATIVE
Trichomonas, UA: NEGATIVE

## 2016-08-22 MED ORDER — METRONIDAZOLE 500 MG PO TABS: 500.0000 mg | ORAL_TABLET | Freq: Two times a day (BID) | ORAL | 0 refills | Status: DC

## 2016-08-22 NOTE — Progress Notes (Signed)
Patient ID: [name], female    DOB: [birth year], 45 y.o.   MRN: [id number]  PCP: Jenny Reichmann, MD  Chief Complaint  Patient presents with  . Vaginitis    Subjective:   HPI Presents for evaluation of vaginal discharge and odor times several days.  45 year old female, currently receiving treatment for multiple sclerosis with Copaxone injections under the care of neurologist, Dr. Leta Baptist. She reports that since starting this medication she has suffered from frequent episodes of developing bacterial vaginosis which respond to treatment with metronidazole. She reports a "fishy, strong, odor", with white discharge.  Hypertension Patient reports that she hasn't taken her blood pressure medication as she socializing with friends and drinking alcohol. She reports that the prior weekend was a special occasion and she doesn't drink alcohol on a regular basis and doesn't like to take medication while drinking.  Social History   Social History  . Marital status: Single    Spouse name: N/A  . Number of children: 3  . 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 Smoking status: Current Every Day Smoker    Packs/day: 0.25    Years: 25.00    Types: Cigarettes    Last attempt to quit: 10/16/2014  . Smokeless tobacco: Never Used     Comment: smoke 5 cigarettes/day or prn  . Alcohol use 0.0 oz/week     Comment: once maybe a month if that  . Drug use: No  . Sexual activity: Yes    Partners: Male    Birth control/ protection: Condom   Other Topics Concern  . Not on file   Social History Narrative   Patient lives at home with her family.   Caffeine Use: 1 cup daily   Family History  Problem Relation Age of Onset  . Mental illness Mother   . Hyperlipidemia Mother   . Hyperthyroidism Mother   . Mental retardation Mother   . ADD / ADHD Son   . Diabetes Father   . Cancer Father   . Cancer Paternal Aunt     breast  .  Arthritis Maternal Grandmother   . Diabetes Maternal Grandmother   . Hearing loss Maternal Grandmother     left hear when she was a child  . Hyperlipidemia Maternal Grandmother   . Hypertension Maternal Grandmother   . Alcohol abuse Maternal Grandfather   . Early death Maternal Grandfather   . Cancer Paternal Aunt     breast  . Multiple sclerosis Cousin     Review of Systems See HPI   Patient Active Problem List   Diagnosis Date Noted  . Other fatigue 08/05/2016  . Migraine without aura and without status migrainosus, not intractable 08/05/2016  . Prediabetes   . MS (multiple sclerosis) ([place])   . Obesity, Class III, BMI 40-49.9 (morbid obesity) ([place]) 10/16/2014  . Smoker 10/16/2014  . OSA on CPAP 10/16/2014  . HTN (hypertension) 01/14/2012  . Sickle cell trait ([place]) 01/14/2012     Prior to Admission medications   Medication Sig Start Date End Date Taking? Authorizing Provider  aspirin 81 MG tablet Take 81 mg by mouth daily.   Yes Historical Provider, MD  COPAXONE 40 MG/ML SOSY Inject 40 mg into the skin. 05/25/16 first injection today at PCP's office 05/03/16  Yes Historical Provider, MD  rizatriptan (MAXALT-MLT) 10 MG disintegrating tablet Take 1 tablet (10  mg total) by mouth as needed for migraine. May repeat in 2 hours if needed 08/05/16  Yes Penni Bombard, MD  telmisartan-hydrochlorothiazide (MICARDIS HCT) 80-12.5 MG tablet Take 1 tablet by mouth daily. 06/10/16  Yes Mancel Bale, PA-C  topiramate (TOPAMAX) 50 MG tablet Take 1 tablet (50 mg total) by mouth 2 (two) times daily. 08/05/16  Yes Penni Bombard, MD  Vitamin D, Ergocalciferol, (DRISDOL) 50000 units CAPS capsule Take 50,000 Units by mouth every 7 (seven) days.   Yes Historical Provider, MD     Allergies  Allergen Reactions  . Iron Shortness Of Breath    IV only   . Tysabri [Natalizumab] Other (See Comments)    Shortness of breath, joint pain, tremors  . Delsym [Dextromethorphan Polistirex Er] Other  (See Comments)    nightmares  . Hydrocodone-Guaifenesin Other (See Comments)    nightmares  . Doxycycline Other (See Comments)    Bad taste to patient       Objective:  Physical Exam  Constitutional: She is oriented to person, place, and time. She appears well-developed and well-nourished.  HENT:  Head: Normocephalic and atraumatic.  Right Ear: External ear normal.  Left Ear: External ear normal.  Nose: Nose normal.  Eyes: Conjunctivae and EOM are normal. Pupils are equal, round, and reactive to light.  Neck: Normal range of motion.  Cardiovascular: Regular rhythm, normal heart sounds and intact distal pulses.   Pulmonary/Chest: Effort normal and breath sounds normal.  Abdominal: Soft.  Genitourinary: Vaginal discharge found.  Genitourinary Comments: KOH/WET Prep specimen collected.  Neurological: She is alert and oriented to person, place, and time.  Skin: Skin is warm and dry.  Psychiatric: She has a normal mood and affect. Her behavior is normal. Judgment and thought content normal.    Vitals:   08/22/16 1641  BP: (!) 157/103  Pulse: (!) 114  Resp: 16  Temp: 98 F (36.7 C)     Assessment & Plan:  1. Vaginitis and vulvovaginitis - POCT Wet Prep with KOH 2. Essential hypertension  Wet prep indicated the presence of clue cells indicating bacterial vaginosis.Discussed with patient the option of Bacterial Vaginosis suppression therapy if episodic infections continue.  Advised patient to resume blood pressure medication and follow-up for blood  pressure recheck 5-7 days.   Plan: . metroNIDAZOLE (FLAGYL) 500 MG tablet    Sig: Take 1 tablet (500 mg total) by mouth 2 (two) times daily with a meal. DO NOT CONSUME ALCOHOL WHILE TAKING THIS MEDICATION.   Follow-up as needed.  Carroll Sage. Kenton Kingfisher, MSN, FNP-C [facility]

## 2016-08-22 NOTE — Patient Instructions (Signed)
     IF you received an x-ray today, you will receive an invoice from [facility]. Please contact [facility] at [phone] with questions or concerns regarding your invoice.   IF you received labwork today, you will receive an invoice from [facility]. Please contact [facility] at [phone] with questions or concerns regarding your invoice.   Our billing staff will not be able to assist you with questions regarding bills from these companies.  You will be contacted with the lab results as soon as they are available. The fastest way to get your results is to activate your My Chart account. Instructions are located on the last page of this paperwork. If you have not heard from us regarding the results in 2 weeks, please contact this office.      

## 2016-09-07 ENCOUNTER — Telehealth: Payer: Self-pay | Admitting: Diagnostic Neuroimaging

## 2016-09-07 NOTE — Telephone Encounter (Signed)
[place] [phone] called to advise, COPAXONE 69 MG/ML SOSY has now gone generic, wants to know if patient can be switched over to generic of this medication (GLATIRAM).

## 2016-09-08 NOTE — Telephone Encounter (Signed)
Ok to switch to generic copaxone. -VRP

## 2016-09-08 NOTE — Telephone Encounter (Signed)
Per Dr Leta Baptist, spoke with Bethena Roys and advised patient may be switched to generic Copaxone. Bethena Roys verbalized understanding, appreciation.

## 2016-09-27 ENCOUNTER — Ambulatory Visit (INDEPENDENT_AMBULATORY_CARE_PROVIDER_SITE_OTHER): Payer: 59 | Admitting: Physician Assistant

## 2016-09-27 VITALS — BP 125/85 | HR 86 | Temp 98.5°F

## 2016-09-27 DIAGNOSIS — N898 Other specified noninflammatory disorders of vagina: Secondary | ICD-10-CM | POA: Diagnosis not present

## 2016-09-27 DIAGNOSIS — B9689 Other specified bacterial agents as the cause of diseases classified elsewhere: Secondary | ICD-10-CM | POA: Diagnosis not present

## 2016-09-27 DIAGNOSIS — N760 Acute vaginitis: Secondary | ICD-10-CM | POA: Diagnosis not present

## 2016-09-27 LAB — POCT WET + KOH PREP
TRICH BY WET PREP: ABSENT
Yeast by KOH: ABSENT
Yeast by wet prep: ABSENT

## 2016-09-27 MED ORDER — METRONIDAZOLE 0.75 % VA GEL: 1.0000 | Freq: Every day | VAGINAL | 0 refills | Status: DC

## 2016-09-27 MED ORDER — FLUCONAZOLE 150 MG PO TABS: 150.0000 mg | ORAL_TABLET | Freq: Once | ORAL | 0 refills | Status: AC

## 2016-09-27 NOTE — Progress Notes (Signed)
[name]  MRN: [id number] DOB: 1971/01/11  Subjective:  Pt presents to clinic with vaginal odor that comes and goes.  She has had BV multiple times in the last several months - she is sexually active with 1 partner - she mainly notices a smell that she is sensitive to - others do not smell it - she does not have a vaginal discharge - she is very self conscious of it.    Review of Systems  Genitourinary: Positive for vaginal discharge (odor). Negative for dysuria, frequency and vaginal pain.    Patient Active Problem List   Diagnosis Date Noted  . Other fatigue 08/05/2016  . Migraine without aura and without status migrainosus, not intractable 08/05/2016  . Prediabetes   . MS (multiple sclerosis) ([place])   . Obesity, Class III, BMI 40-49.9 (morbid obesity) ([place]) 10/16/2014  . Smoker 10/16/2014  . OSA on CPAP 10/16/2014  . HTN (hypertension) 01/14/2012  . Sickle cell trait ([place]) 01/14/2012    Current Outpatient Prescriptions on File Prior to Visit  Medication Sig Dispense Refill  . aspirin 81 MG tablet Take 81 mg by mouth daily.    Marland Kitchen COPAXONE 40 MG/ML SOSY Inject 40 mg into the skin. 05/25/16 first injection today at PCP's office  11  . rizatriptan (MAXALT-MLT) 10 MG disintegrating tablet Take 1 tablet (10 mg total) by mouth as needed for migraine. May repeat in 2 hours if needed 9 tablet 11  . telmisartan-hydrochlorothiazide (MICARDIS HCT) 80-12.5 MG tablet Take 1 tablet by mouth daily. 90 tablet 0  . topiramate (TOPAMAX) 50 MG tablet Take 1 tablet (50 mg total) by mouth 2 (two) times daily. 60 tablet 12  . Vitamin D, Ergocalciferol, (DRISDOL) 50000 units CAPS capsule Take 50,000 Units by mouth every 7 (seven) days.    . metroNIDAZOLE (FLAGYL) 500 MG tablet Take 1 tablet (500 mg total) by mouth 2 (two) times daily with a meal. DO NOT CONSUME ALCOHOL WHILE TAKING THIS MEDICATION. (Patient not taking: Reported on 09/27/2016) 14 tablet 0   No current facility-administered  medications on file prior to visit.     Allergies  Allergen Reactions  . Iron Shortness Of Breath    IV only   . Tysabri [Natalizumab] Other (See Comments)    Shortness of breath, joint pain, tremors  . Delsym [Dextromethorphan Polistirex Er] Other (See Comments)    nightmares  . Hydrocodone-Guaifenesin Other (See Comments)    nightmares  . Doxycycline Other (See Comments)    Bad taste to patient    Pt patients past, family and social history were reviewed and updated.   Objective:  BP 125/85 (BP Location: Right Arm, Patient Position: Sitting, Cuff Size: Large)   Pulse 86   Temp 98.5 F (36.9 C) (Oral)   Physical Exam  Constitutional: She is oriented to person, place, and time and well-developed, well-nourished, and in no distress.  HENT:  Head: Normocephalic and atraumatic.  Right Ear: Hearing and external ear normal.  Left Ear: Hearing and external ear normal.  Eyes: Conjunctivae are normal.  Neck: Normal range of motion.  Pulmonary/Chest: Effort normal.  Neurological: She is alert and oriented to person, place, and time. Gait normal.  Skin: Skin is warm and dry.  Psychiatric: Mood, memory, affect and judgment normal.  Vitals reviewed.   Results for orders placed or performed in visit on 09/27/16  POCT Wet + KOH Prep  Result Value Ref Range   Yeast by KOH Absent Present, Absent   Yeast  by wet prep Absent Present, Absent   WBC by wet prep None None, Few, Too numerous to count   Clue Cells Wet Prep HPF POC Many (A) None, Too numerous to count   Trich by wet prep Absent Present, Absent   Bacteria Wet Prep HPF POC Few None, Few, Too numerous to count   Epithelial Cells By [employer] ([facility]) Few None, Few, Too numerous to count   RBC,UR,HPF,POC None None RBC/hpf    Assessment and Plan :  Foul smelling vaginal discharge - Plan: POCT Wet + KOH Prep, fluconazole (DIFLUCAN) 150 MG tablet  BV (bacterial vaginosis) - Plan: POCT Wet + KOH Prep, metroNIDAZOLE (METROGEL  VAGINAL) 0.75 % vaginal gel   D/w pt we will treat this time but because of recent infections we will also check for cure in a week after the medications and I have placed that order to make sure that she is actually treated - depending on the results we may have to reset the vaginal pH with boric acid due to recurrent BV.  This is will done depending on her results in a week and upcoming months symptoms.  Windell Hummingbird PA-C  [facility] 09/27/2016 6:10 PM

## 2016-10-19 ENCOUNTER — Telehealth: Payer: Self-pay | Admitting: *Deleted

## 2016-10-19 NOTE — Telephone Encounter (Signed)
LVM requesting patient call back to reschedule follow up due to   Provider being out of the office. Left name, number and advised the phone staff may reschedule for her. Marland Kitchen

## 2016-10-20 NOTE — Telephone Encounter (Signed)
LVM #2 requesting patient call back and reschedule FU with phone staff. Left number.

## 2016-10-24 ENCOUNTER — Encounter: Payer: Self-pay | Admitting: *Deleted

## 2016-10-24 NOTE — Telephone Encounter (Signed)
LVM #3 advising patient to call and reschedule FU as provider will be out of office on 11/25/16. Advised the appointment will now be cancelled, and she will receive a letter as well. Left number.

## 2016-11-24 ENCOUNTER — Encounter: Payer: Self-pay | Admitting: Physician Assistant

## 2016-11-24 ENCOUNTER — Ambulatory Visit (INDEPENDENT_AMBULATORY_CARE_PROVIDER_SITE_OTHER): Payer: 59 | Admitting: Physician Assistant

## 2016-11-24 VITALS — BP 133/75 | HR 93 | Temp 98.6°F | Resp 16 | Ht 63.0 in

## 2016-11-24 DIAGNOSIS — E559 Vitamin D deficiency, unspecified: Secondary | ICD-10-CM

## 2016-11-24 DIAGNOSIS — I10 Essential (primary) hypertension: Secondary | ICD-10-CM | POA: Diagnosis not present

## 2016-11-24 DIAGNOSIS — J069 Acute upper respiratory infection, unspecified: Secondary | ICD-10-CM | POA: Diagnosis not present

## 2016-11-24 MED ORDER — HYDROCOD POLST-CPM POLST ER 10-8 MG/5ML PO SUER: 5.0000 mL | Freq: Two times a day (BID) | ORAL | 0 refills | Status: DC | PRN

## 2016-11-24 MED ORDER — TELMISARTAN-HCTZ 80-12.5 MG PO TABS: 1.0000 | ORAL_TABLET | Freq: Every day | ORAL | 0 refills | Status: DC

## 2016-11-24 MED ORDER — AZITHROMYCIN 250 MG PO TABS
ORAL_TABLET | ORAL | 0 refills | Status: AC
Start: 2016-11-24 — End: 2016-11-29

## 2016-11-24 MED ORDER — CHOLECALCIFEROL 50 MCG (2000 UT) PO CAPS: 2000.0000 [IU] | ORAL_CAPSULE | Freq: Every day | ORAL | 4 refills | Status: DC

## 2016-11-24 NOTE — Progress Notes (Signed)
[name]  MRN: [id number] DOB: 1971/05/04  Subjective:  Pt presents to clinic with cold symptoms.  Started last week - sinus pressure and cough.  She has had headaches - cough has yellow sputum and yellow rhinorrhea.  She has noticed no wheezing.  She stopped smoking 4 days ago. Not sleeping well because of the cough.  She is doing well in regards to her MS.  She would also like refills on her HTN meds.  She had lab work done in 07/2016 with neurologist.     Review of Systems  Constitutional: Negative for chills and fever.  HENT: Positive for congestion, rhinorrhea (yellow) and sore throat (worse in the am and at night). Negative for ear pain and sinus pain.   Respiratory: Positive for cough (yellow sputum). Negative for shortness of breath and wheezing.        No h/o asthma - trying to stop smoking - smoked about 1/2 ppd  Gastrointestinal: Negative.   Musculoskeletal: Positive for myalgias (gotten better).  Neurological: Positive for headaches.    Patient Active Problem List   Diagnosis Date Noted  . Other fatigue 08/05/2016  . Migraine without aura and without status migrainosus, not intractable 08/05/2016  . Prediabetes   . MS (multiple sclerosis) ([place])   . Obesity, Class III, BMI 40-49.9 (morbid obesity) ([place]) 10/16/2014  . Smoker 10/16/2014  . OSA on CPAP 10/16/2014  . HTN (hypertension) 01/14/2012  . Sickle cell trait ([place]) 01/14/2012    Current Outpatient Prescriptions on File Prior to Visit  Medication Sig Dispense Refill  . aspirin 81 MG tablet Take 81 mg by mouth daily.    Marland Kitchen COPAXONE 40 MG/ML SOSY Inject 40 mg into the skin. 05/25/16 first injection today at PCP's office  11  . topiramate (TOPAMAX) 50 MG tablet Take 1 tablet (50 mg total) by mouth 2 (two) times daily. 60 tablet 12  . Vitamin D, Ergocalciferol, (DRISDOL) 50000 units CAPS capsule Take 50,000 Units by mouth every 7 (seven) days.    . rizatriptan (MAXALT-MLT) 10 MG disintegrating tablet Take 1 tablet  (10 mg total) by mouth as needed for migraine. May repeat in 2 hours if needed (Patient not taking: Reported on 11/24/2016) 9 tablet 11   No current facility-administered medications on file prior to visit.     Allergies  Allergen Reactions  . Iron Shortness Of Breath    IV only   . Tysabri [Natalizumab] Other (See Comments)    Shortness of breath, joint pain, tremors  . Delsym [Dextromethorphan Polistirex Er] Other (See Comments)    nightmares  . Hydrocodone-Guaifenesin Other (See Comments)    nightmares  . Doxycycline Other (See Comments)    Bad taste to patient    Pt patients past, family and social history were reviewed and updated.   Objective:  BP 133/75   Pulse 93   Temp 98.6 F (37 C) (Oral)   Resp 16   Ht 5\' 3"  (1.6 m)   LMP 11/20/2016   SpO2 100%   Physical Exam  Constitutional: She is oriented to person, place, and time and well-developed, well-nourished, and in no distress.  HENT:  Head: Normocephalic and atraumatic.  Right Ear: Hearing, tympanic membrane, external ear and ear canal normal.  Left Ear: Hearing, tympanic membrane, external ear and ear canal normal.  Nose: Mucosal edema (red) present.  Mouth/Throat: Uvula is midline, oropharynx is clear and moist and mucous membranes are normal.  Eyes: Conjunctivae are normal.  Neck: Normal range  of motion.  Cardiovascular: Normal rate, regular rhythm and normal heart sounds.   No murmur heard. Pulmonary/Chest: Effort normal and breath sounds normal.  Dry cough esp with forced expiration  Neurological: She is alert and oriented to person, place, and time. Gait normal.  Skin: Skin is warm and dry.  Psychiatric: Mood, memory, affect and judgment normal.  Vitals reviewed.   Assessment and Plan :  Vitamin D deficiency - Plan: Cholecalciferol 2000 units CAPS - continue at her 9/17 visit she was still low but improved - we will continue the dose recommended by neurologist.  Essential hypertension - Plan:  telmisartan-hydrochlorothiazide (MICARDIS HCT) 80-12.5 MG tablet - well controlled - continue medication  URI with cough and congestion - Plan: chlorpheniramine-HYDROcodone (TUSSIONEX PENNKINETIC ER) 10-8 MG/5ML SUER, azithromycin (ZITHROMAX Z-PAK) 250 MG tablet - symptomatic care  Windell Hummingbird PA-C  [facility] [place] 11/24/2016 3:59 PM

## 2016-11-24 NOTE — Patient Instructions (Signed)
     IF you received an x-ray today, you will receive an invoice from [facility]. Please contact [facility] at [phone] with questions or concerns regarding your invoice.   IF you received labwork today, you will receive an invoice from [facility]. Please contact [facility] at [phone] with questions or concerns regarding your invoice.   Our billing staff will not be able to assist you with questions regarding bills from these companies.  You will be contacted with the lab results as soon as they are available. The fastest way to get your results is to activate your My Chart account. Instructions are located on the last page of this paperwork. If you have not heard from us regarding the results in 2 weeks, please contact this office.     

## 2016-11-25 ENCOUNTER — Ambulatory Visit: Payer: 59 | Admitting: Diagnostic Neuroimaging

## 2016-11-28 ENCOUNTER — Ambulatory Visit (INDEPENDENT_AMBULATORY_CARE_PROVIDER_SITE_OTHER): Payer: 59 | Admitting: Physician Assistant

## 2016-11-28 VITALS — BP 160/100 | HR 90 | Temp 98.8°F | Resp 17 | Ht 63.0 in | Wt 282.0 lb

## 2016-11-28 DIAGNOSIS — R319 Hematuria, unspecified: Secondary | ICD-10-CM

## 2016-11-28 LAB — POC MICROSCOPIC URINALYSIS (UMFC): MUCUS RE: ABSENT

## 2016-11-28 LAB — POCT URINALYSIS DIP (MANUAL ENTRY)
Glucose, UA: NEGATIVE
NITRITE UA: NEGATIVE
PH UA: 5.5
Spec Grav, UA: 1.015
Urobilinogen, UA: 1

## 2016-11-28 MED ORDER — PHENAZOPYRIDINE HCL 200 MG PO TABS: 200.0000 mg | ORAL_TABLET | Freq: Three times a day (TID) | ORAL | 0 refills | Status: DC | PRN

## 2016-11-28 MED ORDER — PHENAZOPYRIDINE HCL 100 MG PO TABS
200.0000 mg | ORAL_TABLET | Freq: Once | ORAL | Status: AC
  Administered 2016-11-28: 200 mg via ORAL

## 2016-11-28 MED ORDER — SULFAMETHOXAZOLE-TRIMETHOPRIM 800-160 MG PO TABS: 1.0000 | ORAL_TABLET | Freq: Two times a day (BID) | ORAL | 0 refills | Status: DC

## 2016-11-28 NOTE — Patient Instructions (Addendum)
Return for repeat UA in 2 weeks (when NOT menstruating)    IF you received an x-ray today, you will receive an invoice from [facility]. Please contact [facility] at [phone] with questions or concerns regarding your invoice.   IF you received labwork today, you will receive an invoice from [facility]. Please contact [facility] at [phone] with questions or concerns regarding your invoice.   Our billing staff will not be able to assist you with questions regarding bills from these companies.  You will be contacted with the lab results as soon as they are available. The fastest way to get your results is to activate your My Chart account. Instructions are located on the last page of this paperwork. If you have not heard from [place] regarding the results in 2 weeks, please contact this office.

## 2016-11-28 NOTE — Progress Notes (Signed)
Patient ID: [name], female    DOB: September 10, 1971, 46 y.o.   MRN: [id number]  PCP: Jenny Reichmann, MD  Chief Complaint  Patient presents with  . Hematuria    Onset this am   . off balance    Subjective:   Presents for evaluation of hematuria.  She awoke about midnight last night to urine, which is normal for her. However, she noted a mild burning sensation and blood on the TP when wiping. At first she thought she has started her period, but then realized that the blood was coming from a location anterior to the vagina.  She then awoke again at 4 am with urinary urgency and burning, and more bleeding, including some clots. Frequency has persisted since then and she is very uncomfortable. No back or belly pain. No nausea or vomiting. No fever or chills.  She was off balance a bit yesterday, but that has completely resolved. She is taking a zpack for recent upper respiratory infection (see notes 11/24/2016).    Review of Systems As above.    Patient Active Problem List   Diagnosis Date Noted  . Other fatigue 08/05/2016  . Migraine without aura and without status migrainosus, not intractable 08/05/2016  . Prediabetes   . MS (multiple sclerosis) ([place])   . Obesity, Class III, BMI 40-49.9 (morbid obesity) ([place]) 10/16/2014  . Smoker 10/16/2014  . OSA on CPAP 10/16/2014  . HTN (hypertension) 01/14/2012  . Sickle cell trait ([place]) 01/14/2012     Prior to Admission medications   Medication Sig Start Date End Date Taking? Authorizing Provider  aspirin 81 MG tablet Take 81 mg by mouth daily.   Yes Historical Provider, MD  azithromycin (ZITHROMAX Z-PAK) 250 MG tablet Use as directed 11/24/16 11/29/16 Yes Sarah Alleen Borne, PA-C  chlorpheniramine-HYDROcodone (TUSSIONEX PENNKINETIC ER) 10-8 MG/5ML SUER Take 5 mLs by mouth 2 (two) times daily as needed for cough. 11/24/16  Yes Mancel Bale, PA-C  Cholecalciferol 2000 units CAPS Take 1 capsule (2,000 Units total) by mouth daily.  11/24/16  Yes Sarah Alleen Borne, PA-C  COPAXONE 40 MG/ML SOSY Inject 40 mg into the skin. 05/25/16 first injection today at PCP's office 05/03/16  Yes Historical Provider, MD  rizatriptan (MAXALT-MLT) 10 MG disintegrating tablet Take 1 tablet (10 mg total) by mouth as needed for migraine. May repeat in 2 hours if needed 08/05/16  Yes Penni Bombard, MD  telmisartan-hydrochlorothiazide (MICARDIS HCT) 80-12.5 MG tablet Take 1 tablet by mouth daily. 11/24/16  Yes Mancel Bale, PA-C  topiramate (TOPAMAX) 50 MG tablet Take 1 tablet (50 mg total) by mouth 2 (two) times daily. 08/05/16  Yes Penni Bombard, MD  Vitamin D, Ergocalciferol, (DRISDOL) 50000 units CAPS capsule Take 50,000 Units by mouth every 7 (seven) days.   Yes Historical Provider, MD     Allergies  Allergen Reactions  . Iron Shortness Of Breath    IV only   . Tysabri [Natalizumab] Other (See Comments)    Shortness of breath, joint pain, tremors  . Delsym [Dextromethorphan Polistirex Er] Other (See Comments)    nightmares  . Hydrocodone-Guaifenesin Other (See Comments)    nightmares  . Doxycycline Other (See Comments)    Bad taste to patient       Objective:  Physical Exam  Constitutional: She is oriented to person, place, and time. Vital signs are normal. She appears well-developed and well-nourished. No distress.  HENT:  Head: Normocephalic and atraumatic.  Cardiovascular:  Normal rate, regular rhythm and normal heart sounds.   Pulmonary/Chest: Effort normal and breath sounds normal.  Abdominal: Soft. Normal appearance and bowel sounds are normal. She exhibits no distension and no mass. There is no hepatosplenomegaly. There is no tenderness. There is no rigidity, no rebound, no guarding, no CVA tenderness, no tenderness at McBurney's point and negative Murphy's sign. No hernia.  Musculoskeletal: Normal range of motion.       Lumbar back: Normal.  Neurological: She is alert and oriented to person, place, and time.  Skin: Skin  is warm and dry. No rash noted. She is not diaphoretic. No pallor.  Psychiatric: She has a normal mood and affect. Her speech is normal and behavior is normal. Judgment normal.    Results for orders placed or performed in visit on 11/28/16  POCT Microscopic Urinalysis ([facility])  Result Value Ref Range   WBC,UR,HPF,POC Too numerous to count  (A) None WBC/hpf   RBC,UR,HPF,POC Too numerous to count  (A) None RBC/hpf   Bacteria Moderate (A) None, Too numerous to count   Mucus Absent Absent   Epithelial Cells, UR Per Microscopy None None, Too numerous to count cells/hpf  POCT urinalysis dipstick  Result Value Ref Range   Color, UA red (A) yellow   Clarity, UA cloudy (A) clear   Glucose, UA negative negative   Bilirubin, UA moderate (A) negative   Ketones, POC UA trace (5) (A) negative   Spec Grav, UA 1.015    Blood, UA large (A) negative   pH, UA 5.5    Protein Ur, POC >=300 (A) negative   Urobilinogen, UA 1.0    Nitrite, UA Negative Negative   Leukocytes, UA large (3+) (A) Negative          Assessment & Plan:   1. Hematuria, unspecified type 1 dose of phenazopyridine in the office. Treat for presumptive UTI empirically. Recheck in 2 weeks to verify resolution of hematuria. - POCT Microscopic Urinalysis ([facility]) - POCT urinalysis dipstick - Urine culture - sulfamethoxazole-trimethoprim (BACTRIM DS,SEPTRA DS) 800-160 MG tablet; Take 1 tablet by mouth 2 (two) times daily.  Dispense: 10 tablet; Refill: 0 - phenazopyridine (PYRIDIUM) 200 MG tablet; Take 1 tablet (200 mg total) by mouth 3 (three) times daily as needed for pain.  Dispense: 10 tablet; Refill: 0   Fara Chute, PA-C Physician Assistant-Certified [facility] [place]

## 2016-11-28 NOTE — Progress Notes (Signed)
Patient ID: [name], female    DOB: 15-Nov-1970, 46 y.o.   MRN: [id number]  PCP: Jenny Reichmann, MD  Chief Complaint  Patient presents with  . Hematuria    Onset this am   . off balance    Subjective:   Presents for evaluation of hematuria.  Pt is a 46yo AA female with a history of MS who presents with hematuria x 1 day. She states that she normally gets up in the middle of the night to urinate, but last night she noticed blood on the tissue when she wiped and felt a burning sensation toward the end of her urine stream. She woke to urinate 4 more times during the night. She denies fever, chills, fatigue, nausea, vomiting, abdominal pain, or back pain. Deneis vaginal itching or discharge. She also states that she had some walking and balance issues yesterday that seem to have resolved. She was recently (1/11) prescribed a Z pak for a URI.     Review of Systems In addition to that mentioned in HPI above: Pulm: Admits to cough. Denies SOB. Abd: Denies diarrhea or constipation.    Patient Active Problem List   Diagnosis Date Noted  . Other fatigue 08/05/2016  . Migraine without aura and without status migrainosus, not intractable 08/05/2016  . Prediabetes   . MS (multiple sclerosis) ([place])   . Obesity, Class III, BMI 40-49.9 (morbid obesity) ([place]) 10/16/2014  . Smoker 10/16/2014  . OSA on CPAP 10/16/2014  . HTN (hypertension) 01/14/2012  . Sickle cell trait ([place]) 01/14/2012      Prior to Admission medications   Medication Sig Start Date End Date Taking? Authorizing Provider  aspirin 81 MG tablet Take 81 mg by mouth daily.   Yes Historical Provider, MD  azithromycin (ZITHROMAX Z-PAK) 250 MG tablet Use as directed 11/24/16 11/29/16 Yes Sarah Alleen Borne, PA-C  chlorpheniramine-HYDROcodone (TUSSIONEX PENNKINETIC ER) 10-8 MG/5ML SUER Take 5 mLs by mouth 2 (two) times daily as needed for cough. 11/24/16  Yes Mancel Bale, PA-C  Cholecalciferol 2000 units CAPS Take 1 capsule  (2,000 Units total) by mouth daily. 11/24/16  Yes Sarah Alleen Borne, PA-C  COPAXONE 40 MG/ML SOSY Inject 40 mg into the skin. 05/25/16 first injection today at PCP's office 05/03/16  Yes Historical Provider, MD  rizatriptan (MAXALT-MLT) 10 MG disintegrating tablet Take 1 tablet (10 mg total) by mouth as needed for migraine. May repeat in 2 hours if needed 08/05/16  Yes Penni Bombard, MD  telmisartan-hydrochlorothiazide (MICARDIS HCT) 80-12.5 MG tablet Take 1 tablet by mouth daily. 11/24/16  Yes Mancel Bale, PA-C  topiramate (TOPAMAX) 50 MG tablet Take 1 tablet (50 mg total) by mouth 2 (two) times daily. 08/05/16  Yes Penni Bombard, MD  Vitamin D, Ergocalciferol, (DRISDOL) 50000 units CAPS capsule Take 50,000 Units by mouth every 7 (seven) days.   Yes Historical Provider, MD     Allergies  Allergen Reactions  . Iron Shortness Of Breath    IV only   . Tysabri [Natalizumab] Other (See Comments)    Shortness of breath, joint pain, tremors  . Delsym [Dextromethorphan Polistirex Er] Other (See Comments)    nightmares  . Hydrocodone-Guaifenesin Other (See Comments)    nightmares  . Doxycycline Other (See Comments)    Bad taste to patient       Objective:  Physical Exam Pulm: Good respiratory effort; Some upper airway congestion; No wheezes, rales, or rhonchi. CV: RRR, No M/R/G Abd:  Nontender, nondistended. No masses. Mild LLQ tenderness to dee palpation and discomfort to palpation of mid lower abdomen. No hepatosplenomegaly.    Assessment & Plan:   1. Hematuria, unspecified type Urinalysis and clinical presentation support UTI. Pt advised to drink plenty of water and take medications as directed. Pt advised to return if symptoms do not resolve or if new or worrisome symptoms arise. - POCT Microscopic Urinalysis ([facility]) - POCT urinalysis dipstick - Urine culture - sulfamethoxazole-trimethoprim (BACTRIM DS,SEPTRA DS) 800-160 MG tablet; Take 1 tablet by mouth 2 (two) times daily.   Dispense: 10 tablet; Refill: 0 - phenazopyridine (PYRIDIUM) 200 MG tablet; Take 1 tablet (200 mg total) by mouth 3 (three) times daily as needed for pain.  Dispense: 10 tablet; Refill: 0 - phenazopyridine (PYRIDIUM) tablet 200 mg; Take 2 tablets (200 mg total) by mouth once.  Lorella Nimrod, PA-S

## 2016-11-30 LAB — URINE CULTURE

## 2016-12-03 ENCOUNTER — Other Ambulatory Visit: Payer: Self-pay | Admitting: Physician Assistant

## 2016-12-03 MED ORDER — AMOXICILLIN 875 MG PO TABS: 875.0000 mg | ORAL_TABLET | Freq: Two times a day (BID) | ORAL | 0 refills | Status: DC

## 2016-12-03 NOTE — Progress Notes (Signed)
Pt is still feeling bad.  Will call in amoxil for a week for the patient.  She should f/u to recheck her urine once she is finished abx.

## 2016-12-12 ENCOUNTER — Telehealth: Payer: Self-pay

## 2016-12-12 DIAGNOSIS — R319 Hematuria, unspecified: Secondary | ICD-10-CM

## 2016-12-12 MED ORDER — FLUCONAZOLE 150 MG PO TABS: 150.0000 mg | ORAL_TABLET | Freq: Once | ORAL | 0 refills | Status: AC

## 2016-12-12 NOTE — Telephone Encounter (Signed)
Meds ordered this encounter  Medications  . fluconazole (DIFLUCAN) 150 MG tablet    Sig: Take 1 tablet (150 mg total) by mouth once. Repeat if needed    Dispense:  2 tablet    Refill:  0    Order Specific Question:   Supervising Provider    Answer:   Brigitte Pulse, EVA N [[id number]]

## 2016-12-12 NOTE — Telephone Encounter (Signed)
Please rx if appropriate

## 2016-12-12 NOTE — Telephone Encounter (Signed)
Pt has been on antibiotic for 2 weeks and has a yeast infection.  Can we please call her in something for this?  [phone]

## 2016-12-13 ENCOUNTER — Telehealth: Payer: Self-pay | Admitting: *Deleted

## 2016-12-13 NOTE — Telephone Encounter (Signed)
Paper form for PA for Glatiramer (Copaxone) begun; placed on Dr [employer] desk for review, signature.

## 2016-12-14 NOTE — Telephone Encounter (Signed)
Completed PA forms faxed to [facility].

## 2016-12-17 ENCOUNTER — Ambulatory Visit: Payer: 59

## 2016-12-17 DIAGNOSIS — Z1231 Encounter for screening mammogram for malignant neoplasm of breast: Secondary | ICD-10-CM | POA: Diagnosis not present

## 2016-12-17 LAB — HM MAMMOGRAPHY

## 2016-12-20 ENCOUNTER — Encounter: Payer: Self-pay | Admitting: Physician Assistant

## 2016-12-20 ENCOUNTER — Ambulatory Visit (INDEPENDENT_AMBULATORY_CARE_PROVIDER_SITE_OTHER): Payer: 59 | Admitting: Physician Assistant

## 2016-12-20 VITALS — BP 118/77 | HR 110 | Temp 99.5°F

## 2016-12-20 DIAGNOSIS — R6889 Other general symptoms and signs: Secondary | ICD-10-CM | POA: Diagnosis not present

## 2016-12-20 DIAGNOSIS — J029 Acute pharyngitis, unspecified: Secondary | ICD-10-CM | POA: Diagnosis not present

## 2016-12-20 LAB — POCT INFLUENZA A/B
Influenza A, POC: NEGATIVE
Influenza B, POC: NEGATIVE

## 2016-12-20 LAB — POCT RAPID STREP A (OFFICE): Rapid Strep A Screen: NEGATIVE

## 2016-12-20 MED ORDER — OSELTAMIVIR PHOSPHATE 75 MG PO CAPS: 75.0000 mg | ORAL_CAPSULE | Freq: Two times a day (BID) | ORAL | 0 refills | Status: DC

## 2016-12-20 NOTE — Progress Notes (Signed)
[name]  MRN: [id number] DOB: 03-25-1971  Subjective:  Pt presents to clinic with feeling poorly.  Started with a headache about 5 days ago for 2 days and then she started her menses so she thought that as the cause - then yesterday she started to feel worse - today she has myalgias that have worsened as the day has gone on and sore throat on the right side and low grade fever.  She had her flu vaccine this year.  She has taken no medications.  Review of Systems  Constitutional: Positive for chills and fever.  HENT: Positive for congestion, postnasal drip and sore throat.   Respiratory: Positive for cough (mild).   Musculoskeletal: Positive for myalgias.  Neurological: Positive for headaches.    Patient Active Problem List   Diagnosis Date Noted  . Other fatigue 08/05/2016  . Migraine without aura and without status migrainosus, not intractable 08/05/2016  . Prediabetes   . MS (multiple sclerosis) ([place])   . Obesity, Class III, BMI 40-49.9 (morbid obesity) ([place]) 10/16/2014  . Smoker 10/16/2014  . OSA on CPAP 10/16/2014  . HTN (hypertension) 01/14/2012  . Sickle cell trait ([place]) 01/14/2012    Current Outpatient Prescriptions on File Prior to Visit  Medication Sig Dispense Refill  . amoxicillin (AMOXIL) 875 MG tablet Take 1 tablet (875 mg total) by mouth 2 (two) times daily. 20 tablet 0  . aspirin 81 MG tablet Take 81 mg by mouth daily.    . chlorpheniramine-HYDROcodone (TUSSIONEX PENNKINETIC ER) 10-8 MG/5ML SUER Take 5 mLs by mouth 2 (two) times daily as needed for cough. 70 mL 0  . Cholecalciferol 2000 units CAPS Take 1 capsule (2,000 Units total) by mouth daily. 90 each 4  . COPAXONE 40 MG/ML SOSY Inject 40 mg into the skin. 05/25/16 first injection today at PCP's office  11  . phenazopyridine (PYRIDIUM) 200 MG tablet Take 1 tablet (200 mg total) by mouth 3 (three) times daily as needed for pain. 10 tablet 0  . rizatriptan (MAXALT-MLT) 10 MG disintegrating tablet Take 1  tablet (10 mg total) by mouth as needed for migraine. May repeat in 2 hours if needed 9 tablet 11  . sulfamethoxazole-trimethoprim (BACTRIM DS,SEPTRA DS) 800-160 MG tablet Take 1 tablet by mouth 2 (two) times daily. 10 tablet 0  . telmisartan-hydrochlorothiazide (MICARDIS HCT) 80-12.5 MG tablet Take 1 tablet by mouth daily. 90 tablet 0  . topiramate (TOPAMAX) 50 MG tablet Take 1 tablet (50 mg total) by mouth 2 (two) times daily. 60 tablet 12  . Vitamin D, Ergocalciferol, (DRISDOL) 50000 units CAPS capsule Take 50,000 Units by mouth every 7 (seven) days.     No current facility-administered medications on file prior to visit.     Allergies  Allergen Reactions  . Iron Shortness Of Breath    IV only   . Tysabri [Natalizumab] Other (See Comments)    Shortness of breath, joint pain, tremors  . Delsym [Dextromethorphan Polistirex Er] Other (See Comments)    nightmares  . Hydrocodone-Guaifenesin Other (See Comments)    nightmares  . Doxycycline Other (See Comments)    Bad taste to patient    Pt patients past, family and social history were reviewed and updated.   Objective:  BP 118/77 (BP Location: Right Arm, Patient Position: Sitting, Cuff Size: Large)   Pulse (!) 110   Temp 99.5 F (37.5 C) (Oral)   LMP 11/20/2016   Physical Exam  Constitutional: She is oriented to person, place, and  time and well-developed, well-nourished, and in no distress.  Looks like she does not feel well.  HENT:  Head: Normocephalic and atraumatic.  Right Ear: Hearing, tympanic membrane, external ear and ear canal normal.  Left Ear: Hearing, tympanic membrane, external ear and ear canal normal.  Nose: Mucosal edema (red ) and rhinorrhea (clear) present.  Mouth/Throat: Uvula is midline and mucous membranes are normal. No uvula swelling. Posterior oropharyngeal erythema (mild) present. No oropharyngeal exudate, posterior oropharyngeal edema or tonsillar abscesses.    Eyes: Conjunctivae are normal.  Neck:  Normal range of motion.  Cardiovascular: Normal rate, regular rhythm and normal heart sounds.   No murmur heard. Pulmonary/Chest: Effort normal. She has no wheezes.  Neurological: She is alert and oriented to person, place, and time. Gait normal.  Skin: Skin is warm and dry.  Psychiatric: Mood, memory, affect and judgment normal.  Vitals reviewed.  Results for orders placed or performed in visit on 12/20/16  POCT Influenza A/B  Result Value Ref Range   Influenza A, POC Negative Negative   Influenza B, POC Negative Negative  POCT rapid strep A  Result Value Ref Range   Rapid Strep A Screen Negative Negative     Assessment and Plan :  Flu-like symptoms - Plan: POCT Influenza A/B, oseltamivir (TAMIFLU) 75 MG capsule  Sore throat - Plan: POCT rapid strep A   I am concerned that the patient has the flu - she is immunocompromised due to her MS and her immunosuppressive medications.  We will be aggressive and start her on tamiflu.  She has symptomatic medications at home that she will use.  She will make sure she stays hydrated.  She will f/u if she is having problems.  Windell Hummingbird PA-C  [facility] 12/20/2016 5:31 PM

## 2016-12-20 NOTE — Patient Instructions (Signed)
     IF you received an x-ray today, you will receive an invoice from [facility]. Please contact [facility] at [phone] with questions or concerns regarding your invoice.   IF you received labwork today, you will receive an invoice from [facility]. Please contact [facility] at [phone] with questions or concerns regarding your invoice.   Our billing staff will not be able to assist you with questions regarding bills from these companies.  You will be contacted with the lab results as soon as they are available. The fastest way to get your results is to activate your My Chart account. Instructions are located on the last page of this paperwork. If you have not heard from us regarding the results in 2 weeks, please contact this office.     

## 2016-12-21 ENCOUNTER — Telehealth: Payer: Self-pay | Admitting: *Deleted

## 2016-12-21 ENCOUNTER — Ambulatory Visit (INDEPENDENT_AMBULATORY_CARE_PROVIDER_SITE_OTHER): Payer: 59 | Admitting: Family Medicine

## 2016-12-21 VITALS — BP 120/78 | Temp 102.7°F | Resp 18

## 2016-12-21 DIAGNOSIS — R070 Pain in throat: Secondary | ICD-10-CM

## 2016-12-21 DIAGNOSIS — R509 Fever, unspecified: Secondary | ICD-10-CM

## 2016-12-21 DIAGNOSIS — R5081 Fever presenting with conditions classified elsewhere: Secondary | ICD-10-CM | POA: Diagnosis not present

## 2016-12-21 DIAGNOSIS — R5382 Chronic fatigue, unspecified: Secondary | ICD-10-CM

## 2016-12-21 LAB — POC MICROSCOPIC URINALYSIS (UMFC): MUCUS RE: ABSENT

## 2016-12-21 LAB — POCT CBC
Granulocyte percent: 79.1 %G (ref 37–80)
HEMATOCRIT: 36.4 % — AB (ref 37.7–47.9)
Hemoglobin: 11.8 g/dL — AB (ref 12.2–16.2)
LYMPH, POC: 2.3 (ref 0.6–3.4)
MCH, POC: 22.5 pg — AB (ref 27–31.2)
MCHC: 32.5 g/dL (ref 31.8–35.4)
MCV: 69.4 fL — AB (ref 80–97)
MID (CBC): 0.9 (ref 0–0.9)
MPV: 9.8 fL (ref 0–99.8)
POC Granulocyte: 12 — AB (ref 2–6.9)
POC LYMPH PERCENT: 15 %L (ref 10–50)
POC MID %: 5.9 % (ref 0–12)
Platelet Count, POC: 286 10*3/uL (ref 142–424)
RBC: 5.25 M/uL (ref 4.04–5.48)
RDW, POC: 17.3 %
WBC: 15.2 10*3/uL — AB (ref 4.6–10.2)

## 2016-12-21 LAB — POCT URINALYSIS DIP (MANUAL ENTRY)
Bilirubin, UA: NEGATIVE
Glucose, UA: NEGATIVE
Ketones, POC UA: NEGATIVE
NITRITE UA: NEGATIVE
PROTEIN UA: NEGATIVE
SPEC GRAV UA: 1.015
UROBILINOGEN UA: 0.2
pH, UA: 5

## 2016-12-21 MED ORDER — FLUCONAZOLE 150 MG PO TABS: 150.0000 mg | ORAL_TABLET | Freq: Once | ORAL | 0 refills | Status: DC

## 2016-12-21 MED ORDER — LEVOFLOXACIN 500 MG PO TABS: 500.0000 mg | ORAL_TABLET | Freq: Every day | ORAL | 0 refills | Status: DC

## 2016-12-21 MED ORDER — IBUPROFEN 100 MG/5ML PO SUSP
600.0000 mg | Freq: Once | ORAL | Status: AC
  Administered 2016-12-21: 600 mg via ORAL

## 2016-12-21 MED ORDER — FLUCONAZOLE 150 MG PO TABS: 150.0000 mg | ORAL_TABLET | Freq: Once | ORAL | 0 refills | Status: AC

## 2016-12-21 NOTE — Telephone Encounter (Signed)
Per Dr. Mingo Amber, advised to  cancel Rx for Levaquin and Diflucan sent to [facility].

## 2016-12-21 NOTE — Progress Notes (Signed)
[name] is a 46 y.o. female who presents to [facility] today for fever:  1.  Fever:  Patient seen here and evaluated yesterday for flulike symhe had negative strep negative results. She was treated for presumed flu and started on Tamiflu.  Since yesterday she has been feeling worse. States fevers worsened. She is not taking any Tylenol or ibuprofen today. She feels fatigued. She does have sore throat today. She is not had anything to eat today. She has been drinking and staying orally hydrated.  Came back to be re-evaluated because her temperature had gone up today.  General malaise has also worsened.   No real cough.  Some runny nose but not bad.  She does have chronic back pain, unsure if worse than usual.    Does have history of hematuria and UTI in past month.  Was told she was to have repeat U/A for hematuria today.  Has had some intermittent dysuria for past several days to week as well.    ROS as above.  Pertinently, no chest pain, palpitations, SOB, Abd pain, N/V/D.   PMH reviewed. Patient is a nonsmoker.   Past Medical History:  Diagnosis Date  . Allergy   . Anemia   . Anxiety   . Clotting disorder ([place])    from beeing anemic  . Depression   . Hypertension   . Iron overload, transfusional   . Migraine   . MS (multiple sclerosis) ([place])   . OSA on CPAP   . Sickle cell anemia ([facility])    trait  . Sickle cell trait ([place])   . Smoker   . Tachycardia    Past Surgical History:  Procedure Laterality Date  . CERVICAL ABLATION    . CESAREAN SECTION     3 times  . CHOLECYSTECTOMY    . TUBAL LIGATION      Medications reviewed. Current Outpatient Prescriptions  Medication Sig Dispense Refill  . aspirin 81 MG tablet Take 81 mg by mouth daily.    Marland Kitchen COPAXONE 40 MG/ML SOSY Inject 40 mg into the skin. 05/25/16 first injection today at PCP's office  11  . oseltamivir (TAMIFLU) 75 MG capsule Take 1 capsule (75 mg total) by mouth 2 (two) times daily. 10 capsule 0    . rizatriptan (MAXALT-MLT) 10 MG disintegrating tablet Take 1 tablet (10 mg total) by mouth as needed for migraine. May repeat in 2 hours if needed 9 tablet 11  . telmisartan-hydrochlorothiazide (MICARDIS HCT) 80-12.5 MG tablet Take 1 tablet by mouth daily. 90 tablet 0  . topiramate (TOPAMAX) 50 MG tablet Take 1 tablet (50 mg total) by mouth 2 (two) times daily. 60 tablet 12  . Vitamin D, Ergocalciferol, (DRISDOL) 50000 units CAPS capsule Take 50,000 Units by mouth every 7 (seven) days.    Marland Kitchen amoxicillin (AMOXIL) 875 MG tablet Take 1 tablet (875 mg total) by mouth 2 (two) times daily. (Patient not taking: Reported on 12/21/2016) 20 tablet 0  . chlorpheniramine-HYDROcodone (TUSSIONEX PENNKINETIC ER) 10-8 MG/5ML SUER Take 5 mLs by mouth 2 (two) times daily as needed for cough. (Patient not taking: Reported on 12/21/2016) 70 mL 0  . Cholecalciferol 2000 units CAPS Take 1 capsule (2,000 Units total) by mouth daily. (Patient not taking: Reported on 12/21/2016) 90 each 4  . phenazopyridine (PYRIDIUM) 200 MG tablet Take 1 tablet (200 mg total) by mouth 3 (three) times daily as needed for pain. (Patient not taking: Reported on 12/21/2016) 10 tablet 0  .  sulfamethoxazole-trimethoprim (BACTRIM DS,SEPTRA DS) 800-160 MG tablet Take 1 tablet by mouth 2 (two) times daily. (Patient not taking: Reported on 12/21/2016) 10 tablet 0   No current facility-administered medications for this visit.      Physical Exam:  BP 120/78   Temp (!) 102.7 F (39.3 C) (Oral)   Resp 18 Pulse 95 Gen:  Patient sitting on exam table, appears stated age.  No distress, though ill-appearing.  She experiences rigors once she took off her jacket in order to be examined.   Head: Normocephalic atraumatic Eyes: EOMI, PERRL, sclera and conjunctiva non-erythematous Ears:  Canals clear bilaterally.  TMs pearly gray bilaterally without erythema or bulging.   Nose:  Nasal turbinates grossly enlarged bilaterally. Some exudates noted.  Mouth: Mucosa  membranes moist. Tonsils +2, nonenlarged, non-erythematous.  No exudates noted.   Neck: No cervical lymphadenopathy noted Heart:  RRR, no murmurs auscultated. Pulm:  Clear to auscultation bilaterally with good air movement.  No wheezes or rales noted.   Abd:  Soft/benign/no tenderness Back:  No CVA tenderness  Results for orders placed or performed in visit on 12/21/16  POCT Microscopic Urinalysis ([facility])  Result Value Ref Range   WBC,UR,HPF,POC Moderate (A) None WBC/hpf   RBC,UR,HPF,POC Moderate (A) None RBC/hpf   Bacteria Many (A) None, Too numerous to count   Mucus Absent Absent   Epithelial Cells, UR Per Microscopy Moderate (A) None, Too numerous to count cells/hpf  POCT urinalysis dipstick  Result Value Ref Range   Color, UA yellow yellow   Clarity, UA clear clear   Glucose, UA negative negative   Bilirubin, UA negative negative   Ketones, POC UA negative negative   Spec Grav, UA 1.015    Blood, UA large (A) negative   pH, UA 5.0    Protein Ur, POC negative negative   Urobilinogen, UA 0.2    Nitrite, UA Negative Negative   Leukocytes, UA Trace (A) Negative  POCT CBC  Result Value Ref Range   WBC 15.2 (A) 4.6 - 10.2 K/uL   Lymph, poc 2.3 0.6 - 3.4   POC LYMPH PERCENT 15.0 10 - 50 %L   MID (cbc) 0.9 0 - 0.9   POC MID % 5.9 0 - 12 %M   POC Granulocyte 12.0 (A) 2 - 6.9   Granulocyte percent 79.1 37 - 80 %G   RBC 5.25 4.04 - 5.48 M/uL   Hemoglobin 11.8 (A) 12.2 - 16.2 g/dL   HCT, POC 36.4 (A) 37.7 - 47.9 %   MCV 69.4 (A) 80 - 97 fL   MCH, POC 22.5 (A) 27 - 31.2 pg   MCHC 32.5 31.8 - 35.4 g/dL   RDW, POC 17.3 %   Platelet Count, POC 286 142 - 424 K/uL   MPV 9.8 0 - 99.8 fL     Assessment and Plan:  1.  flulike symptoms: -I still believe this is mostlyeffects from the flu. I did discuss with the patient Tamiflu can reduce her symptoms by about a day. However it would not have helped her feel any better by now. -Her temperature did come down by about a point here in  the office. -She had not taken any antipyretics today. She is to continue these while at home for symptomatic relief. -She still has hematuria and bacteria in her urine. She has some urinary type symptoms. -Regular treat her today for presumed UTI. If this is contributing to her Reiter's and chills didn't this would be pyelonephritis. Levaquin would cover  for this. -Her CBC here did not have a lymphocytic predominance. This favors bacterial process, though could also just be demargination from fever.. I do not see any evidence of strep pharyngitis on her exam today. Her strep test was negative. She otherwise has no abdominal or chest symptoms. -She is to follow-up with [place] on Friday to ensure she is still doing okay.  She is able to drink/stay hydrated -- if this changes, she is to come back tomorrow or go to ED. - I will also sent in some Diflucan for her as she gets recurrent yeast infections after antibiotic use.

## 2016-12-21 NOTE — Patient Instructions (Signed)
It was good to see you again today.  I'm so sorry you're feeling so crummy!  I still think this is mostly the flu that you're experiencing. Tamiflu can reduce the symptoms by a day or so.    Keep taking ibuprofen or tylenol at home to help with the fevers and aches.    Take the Levaquin 1 pill a day for the next 7 days.This would cover for any urinary or other bacterial processes.  Come back and see [place] on Friday to check on you. Come back tomorrow if you're feeling worse.

## 2016-12-22 ENCOUNTER — Ambulatory Visit: Payer: 59

## 2016-12-22 NOTE — Telephone Encounter (Signed)
Form completed, faxed to [facility] to begin PA for Glatiramer (Copaxone).

## 2016-12-22 NOTE — Telephone Encounter (Signed)
Spoke with Aaron Edelman , pharmacist at [facility].  Requested he check on any update on PA for Glatiramer. Advised him the response this RN received from [facility] is that no patient is found. He stated information must be faxed to [facility] who is now affiliated with [facility]. He will fax over correct form.

## 2016-12-23 ENCOUNTER — Ambulatory Visit (INDEPENDENT_AMBULATORY_CARE_PROVIDER_SITE_OTHER): Payer: 59 | Admitting: Physician Assistant

## 2016-12-23 VITALS — BP 118/70 | HR 117 | Temp 98.1°F | Ht 63.0 in | Wt 272.2 lb

## 2016-12-23 DIAGNOSIS — R6889 Other general symptoms and signs: Secondary | ICD-10-CM | POA: Diagnosis not present

## 2016-12-23 DIAGNOSIS — J029 Acute pharyngitis, unspecified: Secondary | ICD-10-CM

## 2016-12-23 NOTE — Patient Instructions (Addendum)
Complete the prescribed treatment. REST.    IF you received an x-ray today, you will receive an invoice from [facility]. Please contact [facility] at [phone] with questions or concerns regarding your invoice.   IF you received labwork today, you will receive an invoice from [facility]. Please contact [facility] at [phone] with questions or concerns regarding your invoice.   Our billing staff will not be able to assist you with questions regarding bills from these companies.  You will be contacted with the lab results as soon as they are available. The fastest way to get your results is to activate your My Chart account. Instructions are located on the last page of this paperwork. If you have not heard from [place] regarding the results in 2 weeks, please contact this office.

## 2016-12-23 NOTE — Progress Notes (Signed)
Patient ID: [name], female    DOB: 1971-04-19, 46 y.o.   MRN: [id number]  PCP: Harrison Mons, PA-C  Chief Complaint  Patient presents with  . Fatigue    X 3-4 days  . Follow-up    Flu- f/u -throat still sore on leftside  . Cough    X  3 days    Subjective:   Presents for follow up of flu-like symptoms.  Pt is a 46 yo AA female who presents for follow up of flu-like symptoms. She was seen here on 12/20/2016 for fatigue, headache, sore throat, and myalgias; and on 12/21/2016 for fever, rhinorrhea, and continuing fatigue, sore throat, cough, myalgias, and headaches. Flu and rapid strep were both negative, but pt was treated with Tamiflu for suspected Flu and Levaquin for suspected UTI. She was told to follow up to assess for improvement.   Today, she states that her symptoms have improved, though she is still experiencing fatigue, sore throat, cough, and headaches. Her cough is occasional and productive with one episode of hemoptysis. Pt states that her fever broke last night. She has been taking 500 mg Tylenol for headaches, sore throat, and fever with moderate relief.   Review of Systems In addition to that stated in HPI above: Abd: Denies abdominal pain, nausea, vomiting, diarrhea, or constipation.  Skin: Denies rash.   Patient Active Problem List   Diagnosis Date Noted  . Other fatigue 08/05/2016  . Migraine without aura and without status migrainosus, not intractable 08/05/2016  . Prediabetes   . MS (multiple sclerosis) ([place])   . Obesity, Class III, BMI 40-49.9 (morbid obesity) ([place]) 10/16/2014  . Smoker 10/16/2014  . OSA on CPAP 10/16/2014  . HTN (hypertension) 01/14/2012  . Sickle cell trait ([place]) 01/14/2012     Prior to Admission medications   Medication Sig Start Date End Date Taking? Authorizing Provider  aspirin 81 MG tablet Take 81 mg by mouth daily.   Yes Historical Provider, MD  chlorpheniramine-HYDROcodone (TUSSIONEX PENNKINETIC ER) 10-8 MG/5ML  SUER Take 5 mLs by mouth 2 (two) times daily as needed for cough. 11/24/16  Yes Mancel Bale, PA-C  Cholecalciferol 2000 units CAPS Take 1 capsule (2,000 Units total) by mouth daily. 11/24/16  Yes Sarah Alleen Borne, PA-C  COPAXONE 40 MG/ML SOSY Inject 40 mg into the skin. 05/25/16 first injection today at PCP's office 05/03/16  Yes Historical Provider, MD  levofloxacin (LEVAQUIN) 500 MG tablet Take 1 tablet (500 mg total) by mouth daily. 12/21/16  Yes Alveda Reasons, MD  phenazopyridine (PYRIDIUM) 200 MG tablet Take 1 tablet (200 mg total) by mouth 3 (three) times daily as needed for pain. 11/28/16  Yes Chelle Jeffery, PA-C  rizatriptan (MAXALT-MLT) 10 MG disintegrating tablet Take 1 tablet (10 mg total) by mouth as needed for migraine. May repeat in 2 hours if needed 08/05/16  Yes Penni Bombard, MD  sulfamethoxazole-trimethoprim (BACTRIM DS,SEPTRA DS) 800-160 MG tablet Take 1 tablet by mouth 2 (two) times daily. 11/28/16  Yes Chelle Jeffery, PA-C  topiramate (TOPAMAX) 50 MG tablet Take 1 tablet (50 mg total) by mouth 2 (two) times daily. 08/05/16  Yes Penni Bombard, MD  amoxicillin (AMOXIL) 875 MG tablet Take 1 tablet (875 mg total) by mouth 2 (two) times daily. Patient not taking: Reported on 12/23/2016 12/03/16   Mancel Bale, PA-C  oseltamivir (TAMIFLU) 75 MG capsule Take 1 capsule (75 mg total) by mouth 2 (two) times daily. Patient not taking: Reported  on 12/23/2016 12/20/16 12/25/16  Mancel Bale, PA-C  telmisartan-hydrochlorothiazide (MICARDIS HCT) 80-12.5 MG tablet Take 1 tablet by mouth daily. Patient not taking: Reported on 12/23/2016 11/24/16   Mancel Bale, PA-C  Vitamin D, Ergocalciferol, (DRISDOL) 50000 units CAPS capsule Take 50,000 Units by mouth every 7 (seven) days.    Historical Provider, MD     Allergies  Allergen Reactions  . Iron Shortness Of Breath    IV only   . Tysabri [Natalizumab] Other (See Comments)    Shortness of breath, joint pain, tremors  . Delsym [Dextromethorphan  Polistirex Er] Other (See Comments)    nightmares  . Hydrocodone-Guaifenesin Other (See Comments)    nightmares  . Doxycycline Other (See Comments)    Bad taste to patient       Objective:  Physical Exam HEENT: PERRLA. Throat mildly erythematous, no exudates. Ear canals clear bilaterally, TMs intact, non-bulging. No lymphadenopathy, no tenderness to palpation of neck. Pulm: Good respiratory effort. CTAB. No wheezes, rales, or rhonchi. CV: RRR. No M/R/G. Abd: Soft, non-tender, non-distended.    Assessment & Plan:   1. Flu-like symptoms 2. Sore throat Continue therapies prescribed at last visit. Return if fever develops again or if you experience new or worsening symptoms.   Lorella Nimrod, PA-S

## 2016-12-23 NOTE — Progress Notes (Signed)
Patient ID: [name], female    DOB: Jan 17, 1971, 46 y.o.   MRN: [id number]  PCP: Harrison Mons, PA-C  Chief Complaint  Patient presents with  . Fatigue    X 3-4 days  . Follow-up    Flu- f/u -throat still sore on leftside  . Cough    X  3 days    Subjective:   Presents for evaluation of flu-like symptoms.  She initially presented here on 2/06.Rapid flu and rapid strep tests were NEGATIVE. Due to immunocompromise of MS, she was treated with Tamiflu and other supportive care.  She returned on 2/07 feeling worse. Fatigued and feverish, but sore throat resolved. She had recently had a UTI and was advised to return for repeat UA to verify clearance of hematuria, and noted that she had had several days of intermittent dysuria.  UA revealed moderate WBC and RBC and epithelial cells, with many bacteria. WBC was elevated (15.2), with a LEFT shift. Mild anemia (Hgb 11.8). She was started on Levaquin for suspected UTI.  Today she is improved, but reports persistent symptoms of sore throat, cough and headache. Cough is occasionally productive and there was some blood streaking of the mucous x 1. Fever broke overnight. Acetaminophen provides some relief of her symptoms.  Tolerating Levaquin without adverse effects.    Review of Systems As above.    Patient Active Problem List   Diagnosis Date Noted  . Other fatigue 08/05/2016  . Migraine without aura and without status migrainosus, not intractable 08/05/2016  . Prediabetes   . MS (multiple sclerosis) ([place])   . Obesity, Class III, BMI 40-49.9 (morbid obesity) ([place]) 10/16/2014  . Smoker 10/16/2014  . OSA on CPAP 10/16/2014  . HTN (hypertension) 01/14/2012  . Sickle cell trait ([place]) 01/14/2012     Prior to Admission medications   Medication Sig Start Date End Date Taking? Authorizing Provider  aspirin 81 MG tablet Take 81 mg by mouth daily.   Yes Historical Provider, MD  chlorpheniramine-HYDROcodone (TUSSIONEX  PENNKINETIC ER) 10-8 MG/5ML SUER Take 5 mLs by mouth 2 (two) times daily as needed for cough. 11/24/16  Yes Mancel Bale, PA-C  Cholecalciferol 2000 units CAPS Take 1 capsule (2,000 Units total) by mouth daily. 11/24/16  Yes Sarah Alleen Borne, PA-C  COPAXONE 40 MG/ML SOSY Inject 40 mg into the skin. 05/25/16 first injection today at PCP's office 05/03/16  Yes Historical Provider, MD  levofloxacin (LEVAQUIN) 500 MG tablet Take 1 tablet (500 mg total) by mouth daily. 12/21/16  Yes Alveda Reasons, MD  rizatriptan (MAXALT-MLT) 10 MG disintegrating tablet Take 1 tablet (10 mg total) by mouth as needed for migraine. May repeat in 2 hours if needed 08/05/16  Yes Penni Bombard, MD  topiramate (TOPAMAX) 50 MG tablet Take 1 tablet (50 mg total) by mouth 2 (two) times daily. 08/05/16  Yes Penni Bombard, MD  oseltamivir (TAMIFLU) 75 MG capsule Take 1 capsule (75 mg total) by mouth 2 (two) times daily. Patient not taking: Reported on 12/23/2016 12/20/16 12/25/16  Mancel Bale, PA-C  telmisartan-hydrochlorothiazide (MICARDIS HCT) 80-12.5 MG tablet Take 1 tablet by mouth daily. Patient not taking: Reported on 12/23/2016 11/24/16   Mancel Bale, PA-C     Allergies  Allergen Reactions  . Iron Shortness Of Breath    IV only   . Tysabri [Natalizumab] Other (See Comments)    Shortness of breath, joint pain, tremors  . Delsym [Dextromethorphan Polistirex Er] Other (See Comments)  nightmares  . Hydrocodone-Guaifenesin Other (See Comments)    nightmares  . Doxycycline Other (See Comments)    Bad taste to patient       Objective:  Physical Exam  Constitutional: She is oriented to person, place, and time. She appears well-developed and well-nourished. She is active and cooperative. No distress.  BP 118/70 (BP Location: Right Arm, Patient Position: Sitting, Cuff Size: Large)   Pulse (!) 117   Temp 98.1 F (36.7 C) (Oral)   Ht 5\' 3"  (1.6 m)   Wt 272 lb 3.2 oz (123.5 kg)   LMP 12/18/2016 (Exact Date)   SpO2  99%   BMI 48.22 kg/m   HENT:  Head: Normocephalic and atraumatic.  Right Ear: Hearing normal.  Left Ear: Hearing normal.  Eyes: Conjunctivae are normal. No scleral icterus.  Neck: Normal range of motion. Neck supple. No thyromegaly present.  Cardiovascular: Normal rate, regular rhythm and normal heart sounds.   Pulses:      Radial pulses are 2+ on the right side, and 2+ on the left side.  Pulmonary/Chest: Effort normal and breath sounds normal.  Lymphadenopathy:       Head (right side): No tonsillar, no preauricular, no posterior auricular and no occipital adenopathy present.       Head (left side): No tonsillar, no preauricular, no posterior auricular and no occipital adenopathy present.    She has no cervical adenopathy.       Right: No supraclavicular adenopathy present.       Left: No supraclavicular adenopathy present.  Neurological: She is alert and oriented to person, place, and time. No sensory deficit.  Skin: Skin is warm, dry and intact. No rash noted. No cyanosis or erythema. Nails show no clubbing.  Psychiatric: She has a normal mood and affect. Her speech is normal and behavior is normal.           Assessment & Plan:   1. Flu-like symptoms 2. Sore throat Resolving illness. Continue supportive care. Needs repeat UA in 2 weeks due to hematuria.   Fara Chute, PA-C Physician Assistant-Certified [facility]

## 2016-12-26 ENCOUNTER — Other Ambulatory Visit: Payer: Self-pay

## 2016-12-26 NOTE — Telephone Encounter (Signed)
12/26/16 Glatiramer (Copaxone) approved through [employer], [place] ref # [id number], approved for max 12 fills form 12/26/2016 through 12/25/2017 Copy of PA approval faxed to [facility].

## 2017-01-03 ENCOUNTER — Telehealth: Payer: Self-pay

## 2017-01-03 MED ORDER — AMBULATORY NON FORMULARY MEDICATION: 5 refills | Status: DC

## 2017-01-03 NOTE — Telephone Encounter (Signed)
Meds ordered this encounter  Medications  . AMBULATORY NON FORMULARY MEDICATION    Sig: Boric Acid Suppository 600 mg Insert 1 PV daily x 1 week, then twice weekly to maintain vaginal flora    Dispense:  12 suppository    Refill:  5    Order Specific Question:   Supervising Provider    Answer:   Brigitte Pulse, EVA N [[id number]]

## 2017-01-03 NOTE — Telephone Encounter (Signed)
PER Pt. "I HAVE BV AGAIN" DESCRIBES CLEAR D/C WITH SMELL PER SARAH SHE MAY BENEFIT FROM AN "ACID PILL" TO RESET Ph? PLEASE RX IF APPROPRIATE

## 2017-01-06 ENCOUNTER — Encounter: Payer: Self-pay | Admitting: Diagnostic Neuroimaging

## 2017-01-06 ENCOUNTER — Ambulatory Visit (INDEPENDENT_AMBULATORY_CARE_PROVIDER_SITE_OTHER): Payer: 59 | Admitting: Diagnostic Neuroimaging

## 2017-01-06 VITALS — BP 122/85 | HR 89 | Wt 279.0 lb

## 2017-01-06 DIAGNOSIS — Z9989 Dependence on other enabling machines and devices: Secondary | ICD-10-CM | POA: Diagnosis not present

## 2017-01-06 DIAGNOSIS — G4733 Obstructive sleep apnea (adult) (pediatric): Secondary | ICD-10-CM

## 2017-01-06 DIAGNOSIS — R5383 Other fatigue: Secondary | ICD-10-CM | POA: Diagnosis not present

## 2017-01-06 DIAGNOSIS — G35 Multiple sclerosis: Secondary | ICD-10-CM

## 2017-01-06 MED ORDER — RIZATRIPTAN BENZOATE 10 MG PO TBDP: 10.0000 mg | ORAL_TABLET | ORAL | 11 refills | Status: DC | PRN

## 2017-01-06 NOTE — Patient Instructions (Signed)

## 2017-01-06 NOTE — Progress Notes (Signed)
[facility]  PATIENT: [name] DOB: 03-09-1971  REFERRING CLINICIAN:  HISTORY FROM: patient REASON FOR VISIT: follow up   HISTORICAL  CHIEF COMPLAINT:  Chief Complaint  Patient presents with  . Multiple Sclerosis    rm 6, Generic Copaxone, "other than job related stress and sleeping difficulties, doing well"  . Follow-up    3 month    HISTORY OF PRESENT ILLNESS:   UPDATE 01/06/17: Since last visit, MS sxs stable. HA resolved, so stopped TPX. Only using rizatriptan as needed. Overall doing well. Some work related stress. Tolerating copaxone generic. Some injection site reactions in legs, so she is only injecting in arms/stomach/hips, and doing well.   UPDATE 08/05/16: Since last visit, has transitioned to copaxone since Aug 2017. MRI brain reviewed. Having some intermittent HA, esp with weather changes. HA --> bitemporal, retro-orbital, aching, + photophobia, - phonophobia, -N/V; last for 1-2 days; happening 1 per week. These HA have been present since 2014. Never been on preventative meds. Has tried maxalt in 2014 with some benefit.  UPDATE 04/29/16: Since last visit, had possible infusion reaction on 03/30/16 here at infusion center (shaking, chills, malaise), in setting of URI and recently starting z-pack. Given benedryl and solumedrol, then went to ER for observation. Had full recovery. This past week, had tysabri infusion last Wednesday (04/27/16) and was doing well initially, until she developed joint pain (knees, shoulder). Then infusion stopped. Then patient returned home, then developed shaking chills, and went to urgent care for observation. Now doing well. Here to discuss other MS med options.  UPDATE 01/22/16: Since last visit, doing well. Some fatigue on busy work days (Mon and Fri). No new neuro sxs. Tolerating tysabri. Some joint aches pains in arms and wrists.   UPDATE 09/07/15: Since last visit, doing well. Tolerating tysabri. No new events  neurologically. Some more leg swelling lately, esp with sitting for long time at work. 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  UPDATE 06/02/15: Doing well. Strength good. No numbness. Fatigue better. Concentration is better. Intermittent word finding diff (scanning speech). Tolerating tysabri infusions. 3 weeks ago noted fine small bumps on arms, mild itching. Some int headaches in temporal regions, mild.   UPDATE 03/03/15: Since last visit, more right hip and low back pain. Intermittent. Had tysabri on March 14 and Apr 11. Overall c/o fatigue, focusing issues. 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  UPDATE 12/01/14: Since last visit, left side weakness has improved. Now having more problems with right knee pain, esp walking longer distances. Awaiting tysabri scheduling.   PRIOR HPI (10/29/14): 46 year old right-handed female here for evaluation of multiple sclerosis. 10/15/14 patient developed staggering to the left side. The next day she noted left arm and left leg heaviness and weakness. Patient was in the hospital and had evaluation. MRI of the brain demonstrated multiple brain lesions suspicious for demyelinating disease. Patient was treated with IV steroids and her left sided symptoms have almost completely resolved. Patient continues to have a slight limp with left leg difficulty. Also has some slight incoordination with left hand. In the past patient has had intermittent headaches. Patient has also had intermittent episodes of knee pain and intermittent vertigo attacks. Patient has family history of multiple sclerosis in maternal uncle and maternal cousin.    REVIEW OF SYSTEMS: Full 14 system review of systems performed and negative except: insomnia freq urination.   ALLERGIES: Allergies  Allergen Reactions  . Iron Shortness Of Breath    IV only   . Tysabri [Natalizumab] Other (See Comments)    Shortness of breath, joint pain,  tremors  . Delsym [Dextromethorphan Polistirex Er] Other (See Comments)    nightmares  . Hydrocodone-Guaifenesin Other (See Comments)    nightmares  . Doxycycline Other (See Comments)    Bad taste to patient    HOME MEDICATIONS: Outpatient Medications Prior to Visit  Medication Sig Dispense Refill  . AMBULATORY NON FORMULARY MEDICATION Boric Acid Suppository 600 mg Insert 1 PV daily x 1 week, then twice weekly to maintain vaginal flora 12 suppository 5  . Cholecalciferol 2000 units CAPS Take 1 capsule (2,000 Units total) by mouth daily. 90 each 4  . COPAXONE 40 MG/ML SOSY Inject 40 mg into the skin. 05/25/16 first injection today at PCP's office  11  . rizatriptan (MAXALT-MLT) 10 MG disintegrating tablet Take 1 tablet (10 mg total) by mouth as needed for migraine. May repeat in 2 hours if needed 9 tablet 11  . telmisartan-hydrochlorothiazide (MICARDIS HCT) 80-12.5 MG tablet Take 1 tablet by mouth daily. 90 tablet 0  . topiramate (TOPAMAX) 50 MG tablet Take 1 tablet (50 mg total) by mouth 2 (two) times daily. (Patient not taking: Reported on 01/06/2017) 60 tablet 12  . chlorpheniramine-HYDROcodone (TUSSIONEX PENNKINETIC ER) 10-8 MG/5ML SUER Take 5 mLs by mouth 2 (two) times daily as needed for cough. 70 mL 0  . levofloxacin (LEVAQUIN) 500 MG tablet Take 1 tablet (500 mg total) by mouth daily. 7 tablet 0  . oseltamivir (TAMIFLU) 75 MG capsule     . phenazopyridine (PYRIDIUM) 200 MG tablet Take 1 tablet (200 mg total) by mouth 3 (three) times daily as needed for pain. 10 tablet 0   No facility-administered medications prior to visit.     PAST MEDICAL HISTORY: Past Medical History:  Diagnosis Date  . Allergy   . Anemia   . Anxiety   . Clotting disorder ([place])    from beeing anemic  . Depression   . Hypertension   . Iron overload, transfusional   . Migraine   . MS (multiple sclerosis) ([place])   . OSA on CPAP   . Sickle cell anemia ([facility])    trait  . Sickle cell trait ([place])   . Smoker    . Tachycardia     PAST SURGICAL HISTORY: Past Surgical History:  Procedure Laterality Date  . CERVICAL ABLATION    . CESAREAN SECTION     3 times  . CHOLECYSTECTOMY    . TUBAL LIGATION      FAMILY HISTORY: Family History  Problem Relation Age of Onset  . Mental illness Mother   . Hyperlipidemia Mother   . Hyperthyroidism Mother   . Mental retardation Mother   . ADD / ADHD Son   . Diabetes Father   . Cancer Father   . Cancer Paternal Aunt     breast  . Arthritis Maternal Grandmother   . Diabetes Maternal Grandmother   . Hearing loss Maternal Grandmother     left hear when she was a child  . Hyperlipidemia Maternal Grandmother   . Hypertension Maternal Grandmother   . Alcohol abuse Maternal Grandfather   . Early death Maternal Grandfather   . Cancer Paternal Aunt     breast  . Multiple sclerosis Cousin     SOCIAL HISTORY:  Social History   Social History  . Marital status: Single    Spouse name:  N/A  . Number of children: 3  . 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 Smoking status: Current Every Day Smoker    Packs/day: 0.25    Years: 25.00    Types: Cigarettes    Last attempt to quit: 10/16/2014  . Smokeless tobacco: Never Used     Comment: 01/06/17 smoke 2-3 cigarettes/day or prn  . Alcohol use 0.0 oz/week     Comment: once maybe a month if that  . Drug use: No  . Sexual activity: Yes    Partners: Male    Birth control/ protection: Condom   Other Topics Concern  . Not on file   Social History Narrative   Patient lives at home with her family.   Caffeine Use: 1 cup daily     PHYSICAL EXAM  Vitals:   01/06/17 0924  BP: 122/85  Pulse: 89  Weight: 279 lb (126.6 kg)   Wt Readings from Last 3 Encounters:  01/06/17 279 lb (126.6 kg)  12/23/16 272 lb 3.2 oz (123.5 kg)  11/28/16 282 lb (127.9 kg)   Body mass index is 49.42 kg/m.  No exam data present  No flowsheet data  found.  GENERAL EXAM: Patient is in no distress; well developed, nourished and groomed; neck is supple  CARDIOVASCULAR: Regular rate and rhythm, no murmurs, no carotid bruits  NEUROLOGIC: MENTAL STATUS: awake, alert, language fluent, comprehension intact, naming intact, fund of knowledge appropriate CRANIAL NERVE: pupils equal and reactive to light, visual fields full to confrontation, extraocular muscles intact, no nystagmus, facial sensation and strength symmetric, hearing intact, palate elevates symmetrically, uvula midline, shoulder shrug symmetric, tongue midline. MOTOR: normal bulk and tone, full strength in the BUE, BLE SENSORY: normal and symmetric to light touch COORDINATION: finger-nose-finger, fine finger movements normal REFLEXES: deep tendon reflexes present and symmetric GAIT/STATION: narrow based gait; STABLE, SMOOTH     DIAGNOSTIC DATA (LABS, IMAGING, TESTING) - I reviewed patient records, labs, notes, testing and imaging myself where available.  Lab Results  Component Value Date   WBC 15.2 (A) 12/21/2016   HGB 11.8 (A) 12/21/2016   HCT 36.4 (A) 12/21/2016   MCV 69.4 (A) 12/21/2016   PLT 285 08/05/2016      Component Value Date/Time   NA 140 08/05/2016 0956   K 4.5 08/05/2016 0956   CL 102 08/05/2016 0956   CO2 22 08/05/2016 0956   GLUCOSE 94 08/05/2016 0956   GLUCOSE 100 (H) 11/11/2015 0828   BUN 9 08/05/2016 0956   CREATININE 0.81 08/05/2016 0956   CREATININE 0.75 11/11/2015 0828   CALCIUM 10.5 (H) 08/05/2016 0956   PROT 7.0 08/05/2016 0956   ALBUMIN 4.0 08/05/2016 0956   AST 16 08/05/2016 0956   ALT 17 08/05/2016 0956   ALKPHOS 98 08/05/2016 0956   BILITOT <0.2 08/05/2016 0956   GFRNONAA 87 08/05/2016 0956   GFRNONAA >89 11/11/2015 0828   GFRAA 101 08/05/2016 0956   GFRAA >89 11/11/2015 0828   Lab Results  Component Value Date   CHOL 166 11/11/2015   HDL 37 (L) 11/11/2015   LDLCALC 105 11/11/2015   TRIG 119 11/11/2015   CHOLHDL 4.5  11/11/2015   Lab Results  Component Value Date   HGBA1C 6.0 11/11/2015   No results found for: [id number] Lab Results  Component Value Date   TSH 0.904 12/12/2013   Vit D, 25-Hydroxy  Date Value Ref Range  Status  08/05/2016 21.3 (L) 30.0 - 100.0 ng/mL Final    Comment:    Vitamin D deficiency has been defined by the [place] practice guideline as a level of serum 25-OH vitamin D less than 20 ng/mL (1,2). The Endocrine Society went on to further define vitamin D insufficiency as a level between 21 and 29 ng/mL (2). 1. IOM (Institute of Medicine). 2010. Dietary reference    intakes for calcium and D. [place]: The    [employer]. 2. Holick MF, Binkley [place], Bischoff-Ferrari HA, et al.    Evaluation, treatment, and prevention of vitamin D    deficiency: an Endocrine Society clinical practice    guideline. JCEM. 2011 Jul; 96(7):1911-30.     10/17/14 MRI brain (without)  1. Multiple T2/FLAIR hyperintense lesions with associated restricted diffusion involving the white matter of both cerebral hemispheres, right greater than left, most consistent with active demyelinating disease. 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 Follow-up examination with postcontrast imaging could be performed for complete evaluation. 2. No other findings to suggest acute intracranial infarct or other process.  10/17/14 MRI cervical spine (with and without)  - Motion degraded exam demonstrating no convincing evidence for spinal MS.  Certainly there are no areas of T2 hyperintensity or cord enlargement. No disc protrusion or spinal stenosis.  10/18/14 MRI brain (with) - Findings consistent with acute demyelination in the RIGHT posterior frontal subcortical white matter in this patient with suspected multiple sclerosis. Multiple other  areas of T2 signal hyperintensity on the previous study do not display similar postcontrast enhancement. 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  07/08/15 MRI brain [I reviewed images myself and agree with interpretation. -VRP]  1. Regression of multiple sclerosis since December 2015. No active demyelination. 2. Tiny 5 mm right posterior convexity meningioma versus benign dural thickening. This is stable and appears inconsequential.  07/09/16 MRI brain [I reviewed images myself and agree with interpretation. -VRP]  - Progression of white matter hyperintensity in the right temporoparietal lobe consistent with progressive demyelinating disease. New area of demyelination in the left posterior frontal lobe is small measuring 5 mm. No evidence of acute demyelinization. - Stable small posterior right parietal meningioma.  10/21/14 CSF opening pressure 19cm H2O - (WBC 18, RBC 3545, protein 50, glucose 110, cryptococcal ag negative, IgG index 7.1 (normal), OCB > 5 well defined gamma restriction bands)  10/29/14 JCV ab - negative  08/28/15 JCV ab - negative  04/29/16 anti-tysabri ab - POSITIVE     ASSESSMENT AND PLAN  46 y.o. 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 Was on tysabri since Jan 2016 and doing very well, until 2 infusion reactions in May 2017 and Jun 2017, with positive anti-tysabri antibodies. Now on copaxone (generic).   Dx:  MS (multiple sclerosis) ([place])  Other fatigue  OSA on CPAP     PLAN: - for multiple sclerosis -->  - continue copaxone; may consider gilenya or tecfidera in future - continue physical activity,  PT exercises - repeat MRI brain in Aug 2018  - for migraine headaches -->   - continue rizatriptan as  needed  - for joint aches/pains --> - consider low dose fish oil capsule (1-2 per day OTC) for joint pains and anti-inflammatory effect - consider occasional ibuprofen as needed for joint pain - consider yoga or water therapy - reduce sugar / carbohydrate intake  Meds ordered this encounter  Medications  . rizatriptan (MAXALT-MLT) 10 MG disintegrating tablet    Sig: Take 1 tablet (10 mg total) by mouth as needed for migraine. May repeat in 2 hours if needed    Dispense:  9 tablet    Refill:  11   Return in about 6 months (around 07/06/2017).    Penni Bombard, MD [id number], [id number] AM Certified in Neurology, Neurophysiology and Neuroimaging  [facility] [address], [place], [place] [address] [phone]

## 2017-01-24 ENCOUNTER — Telehealth: Payer: Self-pay | Admitting: Physician Assistant

## 2017-01-24 NOTE — Telephone Encounter (Signed)
Patient needs FMLA forms updated for her MS, I have completed the forms based off what Dr Everlene Farrier had put last year. I will be placing the forms in [employer] box on 01/24/17 please return them to the FMLA/Disability box at the [employer] checkout desk within 5-7 business days.  Thank you!

## 2017-01-27 NOTE — Telephone Encounter (Signed)
Done

## 2017-03-06 ENCOUNTER — Telehealth: Payer: Self-pay

## 2017-03-06 NOTE — Telephone Encounter (Signed)
Patient requested date of previous ov.  I printed it out and gave it to her.

## 2017-03-22 ENCOUNTER — Telehealth: Payer: Self-pay

## 2017-03-22 NOTE — Telephone Encounter (Signed)
Patient requests a refill of zofran for dizziness and nausea.  Patient states that she has an appt in August. Also needs a refill of xanax and would like to increase dose due to stress from accident with son. She states that she has 10 pills left.  Also needs it for sleep.  [employer]

## 2017-03-25 MED ORDER — ONDANSETRON 4 MG PO TBDP: 4.0000 mg | ORAL_TABLET | Freq: Three times a day (TID) | ORAL | 0 refills | Status: DC | PRN

## 2017-03-25 NOTE — Telephone Encounter (Signed)
I need to talk with patient about xanax - I have sent in the zofran.

## 2017-03-28 ENCOUNTER — Ambulatory Visit (INDEPENDENT_AMBULATORY_CARE_PROVIDER_SITE_OTHER): Payer: 59 | Admitting: Physician Assistant

## 2017-03-28 ENCOUNTER — Encounter: Payer: Self-pay | Admitting: Physician Assistant

## 2017-03-28 VITALS — BP 125/85 | HR 81 | Temp 98.1°F | Resp 18 | Ht 63.0 in

## 2017-03-28 DIAGNOSIS — Z13220 Encounter for screening for lipoid disorders: Secondary | ICD-10-CM | POA: Diagnosis not present

## 2017-03-28 DIAGNOSIS — E559 Vitamin D deficiency, unspecified: Secondary | ICD-10-CM | POA: Diagnosis not present

## 2017-03-28 DIAGNOSIS — F430 Acute stress reaction: Secondary | ICD-10-CM | POA: Diagnosis not present

## 2017-03-28 DIAGNOSIS — I10 Essential (primary) hypertension: Secondary | ICD-10-CM | POA: Diagnosis not present

## 2017-03-28 DIAGNOSIS — R5383 Other fatigue: Secondary | ICD-10-CM

## 2017-03-28 DIAGNOSIS — G35 Multiple sclerosis: Secondary | ICD-10-CM

## 2017-03-28 DIAGNOSIS — R7303 Prediabetes: Secondary | ICD-10-CM | POA: Diagnosis not present

## 2017-03-28 DIAGNOSIS — G35D Multiple sclerosis, unspecified: Secondary | ICD-10-CM

## 2017-03-28 DIAGNOSIS — Z131 Encounter for screening for diabetes mellitus: Secondary | ICD-10-CM | POA: Diagnosis not present

## 2017-03-28 MED ORDER — ALPRAZOLAM 0.5 MG PO TABS
0.5000 mg | ORAL_TABLET | Freq: Two times a day (BID) | ORAL | 0 refills | Status: DC | PRN
Start: 2017-03-28 — End: 2017-09-08
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
## 2017-03-28 NOTE — Progress Notes (Signed)
   [name]  MRN: [id number] DOB: [birth year]  PCP: Harrison Mons, PA-C  Chief Complaint  Patient presents with  . Follow-up    Vitamin D, HTN, MS    Subjective:  Pt presents to clinic for  Review of Systems  Patient Active Problem List   Diagnosis Date Noted  . Other fatigue 08/05/2016  . Migraine without aura and without status migrainosus, not intractable 08/05/2016  . Prediabetes   . MS (multiple sclerosis) ([place])   . Obesity, Class III, BMI 40-49.9 (morbid obesity) ([place]) 10/16/2014  . Smoker 10/16/2014  . OSA on CPAP 10/16/2014  . HTN (hypertension) 01/14/2012  . Sickle cell trait ([place]) 01/14/2012    Current Outpatient Prescriptions on File Prior to Visit  Medication Sig Dispense Refill  . AMBULATORY NON FORMULARY MEDICATION Boric Acid Suppository 600 mg Insert 1 PV daily x 1 week, then twice weekly to maintain vaginal flora 12 suppository 5  . Cholecalciferol 2000 units CAPS Take 1 capsule (2,000 Units total) by mouth daily. 90 each 4  . COPAXONE 40 MG/ML SOSY Inject 40 mg into the skin. 05/25/16 first injection today at PCP's office  11  . ondansetron (ZOFRAN ODT) 4 MG disintegrating tablet Take 1 tablet (4 mg total) by mouth every 8 (eight) hours as needed for nausea. 20 tablet 0  . rizatriptan (MAXALT-MLT) 10 MG disintegrating tablet Take 1 tablet (10 mg total) by mouth as needed for migraine. May repeat in 2 hours if needed 9 tablet 11  . telmisartan-hydrochlorothiazide (MICARDIS HCT) 80-12.5 MG tablet Take 1 tablet by mouth daily. 90 tablet 0   No current facility-administered medications on file prior to visit.     Allergies  Allergen Reactions  . Iron Shortness Of Breath    IV only   . Tysabri [Natalizumab] Other (See Comments)    Shortness of breath, joint pain, tremors  . Delsym [Dextromethorphan Polistirex Er] Other (See Comments)    nightmares  . Hydrocodone-Guaifenesin Other (See Comments)    nightmares  . Doxycycline Other (See Comments)   Bad taste to patient    Pt patients past, family and social history were reviewed and updated.   Objective:  BP 125/85   Pulse 81   Temp 98.1 F (36.7 C) (Oral)   Resp 18   Ht 5\' 3"  (1.6 m)   SpO2 99%   Physical Exam  Assessment and Plan :  No diagnosis found.  Windell Hummingbird PA-C  [facility] 03/28/2017 3:08 PM

## 2017-03-28 NOTE — Patient Instructions (Signed)
     IF you received an x-ray today, you will receive an invoice from [facility]. Please contact [facility] at [phone] with questions or concerns regarding your invoice.   IF you received labwork today, you will receive an invoice from [facility]. Please contact [facility] at [phone] with questions or concerns regarding your invoice.   Our billing staff will not be able to assist you with questions regarding bills from these companies.  You will be contacted with the lab results as soon as they are available. The fastest way to get your results is to activate your My Chart account. Instructions are located on the last page of this paperwork. If you have not heard from us regarding the results in 2 weeks, please contact this office.     

## 2017-03-29 LAB — CMP14+EGFR
ALBUMIN: 4.3 g/dL (ref 3.5–5.5)
ALK PHOS: 90 IU/L (ref 39–117)
ALT: 20 IU/L (ref 0–32)
AST: 14 IU/L (ref 0–40)
Albumin/Globulin Ratio: 1.3 (ref 1.2–2.2)
BUN / CREAT RATIO: 17 (ref 9–23)
BUN: 12 mg/dL (ref 6–24)
Bilirubin Total: <0.2 mg/dL (ref 0.0–1.2)
CALCIUM: 11.3 mg/dL — AB (ref 8.7–10.2)
CO2: 23 mmol/L (ref 18–29)
CREATININE: 0.72 mg/dL (ref 0.57–1.00)
Chloride: 104 mmol/L (ref 96–106)
GFR, EST AFRICAN AMERICAN: 115 mL/min/{1.73_m2} (ref >59)
GFR, EST NON AFRICAN AMERICAN: 100 mL/min/{1.73_m2} (ref >59)
GLOBULIN, TOTAL: 3.3 g/dL (ref 1.5–4.5)
Glucose: 91 mg/dL (ref 65–99)
Potassium: 4.7 mmol/L (ref 3.5–5.2)
Sodium: 139 mmol/L (ref 134–144)
TOTAL PROTEIN: 7.6 g/dL (ref 6.0–8.5)

## 2017-03-29 LAB — CBC WITH DIFFERENTIAL/PLATELET
Basophils Absolute: 0 10*3/uL (ref 0.0–0.2)
Basos: 0 %
EOS (ABSOLUTE): 0.5 10*3/uL — ABNORMAL HIGH (ref 0.0–0.4)
EOS: 5 %
HEMATOCRIT: 37.1 % (ref 34.0–46.6)
HEMOGLOBIN: 10.9 g/dL — AB (ref 11.1–15.9)
IMMATURE GRANS (ABS): 0 10*3/uL (ref 0.0–0.1)
Immature Granulocytes: 0 %
LYMPHS ABS: 2.8 10*3/uL (ref 0.7–3.1)
LYMPHS: 28 %
MCH: 21 pg — ABNORMAL LOW (ref 26.6–33.0)
MCHC: 29.4 g/dL — AB (ref 31.5–35.7)
MCV: 72 fL — ABNORMAL LOW (ref 79–97)
MONOCYTES: 4 %
Monocytes Absolute: 0.4 10*3/uL (ref 0.1–0.9)
NEUTROS ABS: 6.3 10*3/uL (ref 1.4–7.0)
Neutrophils: 63 %
Platelets: 306 10*3/uL (ref 150–379)
RBC: 5.19 x10E6/uL (ref 3.77–5.28)
RDW: 18 % — ABNORMAL HIGH (ref 12.3–15.4)
WBC: 10 10*3/uL (ref 3.4–10.8)

## 2017-03-29 LAB — LIPID PANEL
Chol/HDL Ratio: 3.4 ratio (ref 0.0–4.4)
Cholesterol, Total: 165 mg/dL (ref 100–199)
HDL: 48 mg/dL (ref >39)
LDL CALC: 94 mg/dL (ref 0–99)
Triglycerides: 115 mg/dL (ref 0–149)
VLDL CHOLESTEROL CAL: 23 mg/dL (ref 5–40)

## 2017-03-29 LAB — TSH: TSH: 1.49 u[IU]/mL (ref 0.450–4.500)

## 2017-03-29 LAB — VITAMIN D 25 HYDROXY (VIT D DEFICIENCY, FRACTURES): Vit D, 25-Hydroxy: 24.3 ng/mL — ABNORMAL LOW (ref 30.0–100.0)

## 2017-03-29 LAB — HEMOGLOBIN A1C
ESTIMATED AVERAGE GLUCOSE: 123 mg/dL
Hgb A1c MFr Bld: 5.9 % — ABNORMAL HIGH (ref 4.8–5.6)

## 2017-03-31 ENCOUNTER — Telehealth: Payer: Self-pay | Admitting: Physician Assistant

## 2017-03-31 NOTE — Telephone Encounter (Signed)
[name] asked if Dr. Gale Journey could take a look at her labs done from 03/28/17 please & thank you   Please Advise

## 2017-04-06 DIAGNOSIS — E559 Vitamin D deficiency, unspecified: Secondary | ICD-10-CM | POA: Diagnosis not present

## 2017-04-06 DIAGNOSIS — I10 Essential (primary) hypertension: Secondary | ICD-10-CM | POA: Diagnosis not present

## 2017-04-06 NOTE — Addendum Note (Signed)
Addended by: Gari Crown D on: 04/06/2017 12:38 PM   Modules accepted: Orders

## 2017-04-07 LAB — PTH, INTACT AND CALCIUM
Calcium: 10.7 mg/dL — ABNORMAL HIGH (ref 8.7–10.2)
PTH: 57 pg/mL (ref 15–65)

## 2017-05-04 DIAGNOSIS — G4733 Obstructive sleep apnea (adult) (pediatric): Secondary | ICD-10-CM | POA: Diagnosis not present

## 2017-05-16 ENCOUNTER — Other Ambulatory Visit: Payer: Self-pay | Admitting: Diagnostic Neuroimaging

## 2017-05-18 ENCOUNTER — Telehealth: Payer: Self-pay | Admitting: Physician Assistant

## 2017-05-18 DIAGNOSIS — I10 Essential (primary) hypertension: Secondary | ICD-10-CM

## 2017-05-18 NOTE — Telephone Encounter (Signed)
Please advise 

## 2017-05-18 NOTE — Telephone Encounter (Signed)
Pt needs a refill on her telmisartan-hydrochlorothiazide (MICARDIS HCT) 80-12.5 MG tablet for a year supply sent to [facility].

## 2017-05-19 MED ORDER — TELMISARTAN-HCTZ 80-12.5 MG PO TABS: 1.0000 | ORAL_TABLET | Freq: Every day | ORAL | 3 refills | Status: DC

## 2017-05-19 NOTE — Telephone Encounter (Signed)
Done

## 2017-05-20 ENCOUNTER — Other Ambulatory Visit: Payer: Self-pay | Admitting: Physician Assistant

## 2017-05-20 ENCOUNTER — Ambulatory Visit: Payer: 59 | Admitting: Physician Assistant

## 2017-05-20 DIAGNOSIS — R300 Dysuria: Secondary | ICD-10-CM

## 2017-05-20 DIAGNOSIS — R319 Hematuria, unspecified: Secondary | ICD-10-CM

## 2017-05-20 LAB — POC MICROSCOPIC URINALYSIS (UMFC)

## 2017-05-20 LAB — POCT URINALYSIS DIP (MANUAL ENTRY)
Bilirubin, UA: NEGATIVE
Blood, UA: NEGATIVE
Glucose, UA: NEGATIVE mg/dL
Ketones, POC UA: NEGATIVE mg/dL
LEUKOCYTES UA: NEGATIVE
NITRITE UA: NEGATIVE
PH UA: 6 (ref 5.0–8.0)
PROTEIN UA: NEGATIVE mg/dL
Spec Grav, UA: 1.02 (ref 1.010–1.025)
Urobilinogen, UA: 0.2 E.U./dL

## 2017-05-20 NOTE — Progress Notes (Signed)
Pt is worried that she might have a UTI

## 2017-06-09 ENCOUNTER — Encounter: Payer: Self-pay | Admitting: Diagnostic Neuroimaging

## 2017-06-26 ENCOUNTER — Ambulatory Visit: Payer: 59 | Admitting: Physician Assistant

## 2017-06-30 ENCOUNTER — Ambulatory Visit: Payer: 59 | Admitting: Diagnostic Neuroimaging

## 2017-07-24 ENCOUNTER — Telehealth: Payer: Self-pay | Admitting: Physician Assistant

## 2017-07-24 NOTE — Telephone Encounter (Signed)
Patient needs her FMLA forms updated for [employer] I have completed the forms based off her last set that was completed on 01/29/17. I will place the forms in [facility] box on 07/24/17 if you could please get them back to me before 07/27/17 that is when they are due to be turned in.  Marking high priority since they are due back so soon.  Thank you

## 2017-07-25 NOTE — Telephone Encounter (Signed)
Done

## 2017-07-26 NOTE — Telephone Encounter (Signed)
Paperwork scanned and faxed to [employer] on 07/26/17

## 2017-08-02 ENCOUNTER — Encounter: Payer: Self-pay | Admitting: Diagnostic Neuroimaging

## 2017-08-02 ENCOUNTER — Ambulatory Visit (INDEPENDENT_AMBULATORY_CARE_PROVIDER_SITE_OTHER): Payer: 59 | Admitting: Diagnostic Neuroimaging

## 2017-08-02 VITALS — BP 131/89 | HR 89 | Ht 63.0 in | Wt 287.2 lb

## 2017-08-02 DIAGNOSIS — G4733 Obstructive sleep apnea (adult) (pediatric): Secondary | ICD-10-CM | POA: Diagnosis not present

## 2017-08-02 DIAGNOSIS — Z9989 Dependence on other enabling machines and devices: Secondary | ICD-10-CM | POA: Diagnosis not present

## 2017-08-02 DIAGNOSIS — G35 Multiple sclerosis: Secondary | ICD-10-CM

## 2017-08-02 DIAGNOSIS — R5383 Other fatigue: Secondary | ICD-10-CM

## 2017-08-02 NOTE — Patient Instructions (Signed)
Thank you for coming to see [place] at [facility]. I hope we have been able to provide you high quality care today.  You may receive a patient satisfaction survey over the next few weeks. We would appreciate your feedback and comments so that we may continue to improve ourselves and the health of our patients.  - check MRI brain   - continue copaxone   ~~~~~~~~~~~~~~~~~~~~~~~~~~~~~~~~~~~~~~~~~~~~~~~~~~~~~~~~~~~~~~~~~  DR. PENUMALLI'S GUIDE TO HAPPY AND HEALTHY LIVING These are some of my general health and wellness recommendations. Some of them may apply to you better than others. Please use common sense as you try these suggestions and feel free to ask me any questions.   ACTIVITY/FITNESS Mental, social, emotional and physical stimulation are very important for brain and body health. Try learning a new activity (arts, music, language, sports, games).  Keep moving your body to the best of your abilities. You can do this at home, inside or outside, the park, community center, gym or anywhere you like. Consider a physical therapist or personal trainer to get started. Consider the app Sworkit. Fitness trackers such as smart-watches, smart-phones or Fitbits can help as well.   NUTRITION Eat more plants: colorful vegetables, nuts, seeds and berries.  Eat less sugar, salt, preservatives and processed foods.  Avoid toxins such as cigarettes and alcohol.  Drink water when you are thirsty. Warm water with a slice of lemon is an excellent morning drink to start the day.  Consider these websites for more information The Nutrition Source (https://www.henry-hernandez.biz/) [employer] Nutrition (WindowBlog.ch)   RELAXATION Consider practicing mindfulness meditation or other relaxation techniques such as deep breathing, prayer, yoga, tai chi, massage. See website mindful.org or the apps Headspace or Calm to help get  started.   SLEEP Try to get at least 7-8+ hours sleep per day. Regular exercise and reduced caffeine will help you sleep better. Practice good sleep hygeine techniques. See website sleep.org for more information.   PLANNING Prepare estate planning, living will, healthcare POA documents. Sometimes this is best planned with the help of an attorney. Theconversationproject.org and agingwithdignity.org are excellent resources.

## 2017-08-02 NOTE — Progress Notes (Signed)
[facility]  PATIENT: [name] DOB: [birth year]  REFERRING CLINICIAN:  HISTORY FROM: patient REASON FOR VISIT: follow up   HISTORICAL  CHIEF COMPLAINT:  Chief Complaint  Patient presents with  . Follow-up  . Multiple Sclerosis    doing  ok, has some fatigue then stumbles.  Issue with dyslexia recently    HISTORY OF PRESENT ILLNESS:   UPDATE (08/02/17, VRP): Since last visit, doing well. Tolerating copaxone. No alleviating or aggravating factors. No more headaches. Fatigue continues. Body aches are mild. Working 40+ hours per week.  UPDATE 01/06/17: Since last visit, MS sxs stable. HA resolved, so stopped TPX. Only using rizatriptan as needed. Overall doing well. Some work related stress. Tolerating copaxone generic. Some injection site reactions in legs, so she is only injecting in arms/stomach/hips, and doing well.   UPDATE 08/05/16: Since last visit, has transitioned to copaxone since Aug 2017. MRI brain reviewed. Having some intermittent HA, esp with weather changes. HA --> bitemporal, retro-orbital, aching, + photophobia, - phonophobia, -N/V; last for 1-2 days; happening 1 per week. These HA have been present since 2014. Never been on preventative meds. Has tried maxalt in 2014 with some benefit.  UPDATE 04/29/16: Since last visit, had possible infusion reaction on 03/30/16 here at infusion center (shaking, chills, malaise), in setting of URI and recently starting z-pack. Given benedryl and solumedrol, then went to ER for observation. Had full recovery. This past week, had tysabri infusion last Wednesday (04/27/16) and was doing well initially, until she developed joint pain (knees, shoulder). Then infusion stopped. Then patient returned home, then developed shaking chills, and went to urgent care for observation. Now doing well. Here to discuss other MS med options.  UPDATE 01/22/16: Since last visit, doing well. Some fatigue on busy work days (Mon and Fri). No  new neuro sxs. Tolerating tysabri. Some joint aches pains in arms and wrists.   UPDATE 09/07/15: Since last visit, doing well. Tolerating tysabri. No new events neurologically. Some more leg swelling lately, esp with sitting for long time at work. 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  UPDATE 06/02/15: Doing well. Strength good. No numbness. Fatigue better. Concentration is better. Intermittent word finding diff (scanning speech). Tolerating tysabri infusions. 3 weeks ago noted fine small bumps on arms, mild itching. Some int headaches in temporal regions, mild.   UPDATE 03/03/15: Since last visit, more right hip and low back pain. Intermittent. Had tysabri on March 14 and Apr 11. Overall c/o fatigue, focusing issues. 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  UPDATE 12/01/14: Since last visit, left side weakness has improved. Now having more problems with right knee pain, esp walking longer distances. Awaiting tysabri scheduling.   PRIOR HPI (10/29/14): 46 year old right-handed female here for evaluation of multiple sclerosis. 10/15/14 patient developed staggering to the left side. The next day she noted left arm and left leg heaviness and weakness. Patient was in the hospital and had evaluation. MRI of the brain demonstrated multiple brain lesions suspicious for demyelinating disease. Patient was treated with IV steroids and her left sided symptoms have almost completely resolved. Patient continues to have a slight limp with left leg difficulty. Also has some slight incoordination with left hand. In the past patient has had intermittent headaches. Patient has also had intermittent episodes of knee pain and intermittent vertigo attacks. Patient has family history of multiple sclerosis in maternal uncle and maternal  cousin.    REVIEW OF SYSTEMS: Full 14 system review of systems performed and negative except: apnea blurred vision in left eye.     ALLERGIES: Allergies  Allergen Reactions  . Iron Shortness Of Breath    IV only   . Tysabri [Natalizumab] Other (See Comments)    Shortness of breath, joint pain, tremors  . Delsym [Dextromethorphan Polistirex Er] Other (See Comments)    nightmares  . Hydrocodone-Guaifenesin Other (See Comments)    nightmares  . Doxycycline Other (See Comments)    Bad taste to patient    HOME MEDICATIONS: Outpatient Medications Prior to Visit  Medication Sig Dispense Refill  . ALPRAZolam (XANAX) 0.5 MG tablet Take 1-1.5 tablets (0.5-0.75 mg total) by mouth 2 (two) times daily as needed for anxiety. 60 tablet 0  . Cholecalciferol 2000 units CAPS Take 1 capsule (2,000 Units total) by mouth daily. (Patient taking differently: Take 4,000 Units by mouth daily. ) 90 each 4  . Glatiramer Acetate 40 MG/ML SOSY INJECT 1 SYRINGE (40 MG) SUB-Q 3 TIMES A WEEK 12 mL 11  . telmisartan-hydrochlorothiazide (MICARDIS HCT) 80-12.5 MG tablet Take 1 tablet by mouth daily. 90 tablet 3  . AMBULATORY NON FORMULARY MEDICATION Boric Acid Suppository 600 mg Insert 1 PV daily x 1 week, then twice weekly to maintain vaginal flora (Patient not taking: Reported on 08/02/2017) 12 suppository 5  . ondansetron (ZOFRAN ODT) 4 MG disintegrating tablet Take 1 tablet (4 mg total) by mouth every 8 (eight) hours as needed for nausea. (Patient not taking: Reported on 08/02/2017) 20 tablet 0  . rizatriptan (MAXALT-MLT) 10 MG disintegrating tablet Take 1 tablet (10 mg total) by mouth as needed for migraine. May repeat in 2 hours if needed (Patient not taking: Reported on 08/02/2017) 9 tablet 11   No facility-administered medications prior to visit.     PAST MEDICAL HISTORY: Past Medical History:  Diagnosis Date  . Allergy   . Anemia   . Anxiety   . Clotting disorder ([place])    from beeing anemic  . Depression   . Hypertension   . Iron overload, transfusional   . Migraine   . MS (multiple sclerosis) ([place])   . OSA on CPAP   .  Sickle cell anemia ([facility])    trait  . Sickle cell trait ([place])   . Smoker   . Tachycardia     PAST SURGICAL HISTORY: Past Surgical History:  Procedure Laterality Date  . CERVICAL ABLATION    . CESAREAN SECTION     3 times  . CHOLECYSTECTOMY    . TUBAL LIGATION      FAMILY HISTORY: Family History  Problem Relation Age of Onset  . Mental illness Mother   . Hyperlipidemia Mother   . Hyperthyroidism Mother   . Mental retardation Mother   . ADD / ADHD Son   . Diabetes Father   . Cancer Father   . Cancer Paternal Aunt        breast  . Arthritis Maternal Grandmother   . Diabetes Maternal Grandmother   . Hearing loss Maternal Grandmother        left hear when she was a child  . Hyperlipidemia Maternal Grandmother   . Hypertension Maternal Grandmother   . Alcohol abuse Maternal Grandfather   . Early death Maternal Grandfather   . Cancer Paternal Aunt        breast  . Multiple sclerosis Cousin     SOCIAL HISTORY:  Social History   Social  History  . Marital status: Single    Spouse name: N/A  . Number of children: 3  . 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 Smoking status: Current Every Day Smoker    Packs/day: 0.25    Years: 25.00    Types: Cigarettes    Last attempt to quit: 10/16/2014  . Smokeless tobacco: Never Used     Comment: 01/06/17 smoke 2-3 cigarettes/day or prn  . Alcohol use 0.0 oz/week     Comment: once maybe a month if that  . Drug use: No  . Sexual activity: Yes    Partners: Male    Birth control/ protection: Condom   Other Topics Concern  . Not on file   Social History Narrative   Patient lives at home with her family.   Caffeine Use: 1 cup daily   MA [facility] [place]     PHYSICAL EXAM  Vitals:   08/02/17 0822  BP: 131/89  Pulse: 89  Weight: 287 lb 3.2 oz (130.3 kg)  Height: 5\' 3"  (1.6 m)   Wt Readings from Last 3 Encounters:  08/02/17 287 lb 3.2 oz (130.3 kg)   01/06/17 279 lb (126.6 kg)  12/23/16 272 lb 3.2 oz (123.5 kg)   Body mass index is 50.88 kg/m.   Visual Acuity Screening   Right eye Left eye Both eyes  Without correction: 20/30 20/40   With correction:       No flowsheet data found.  GENERAL EXAM: Patient is in no distress; well developed, nourished and groomed; neck is supple  CARDIOVASCULAR: Regular rate and rhythm, no murmurs, no carotid bruits  NEUROLOGIC: MENTAL STATUS: awake, alert, language fluent, comprehension intact, naming intact, fund of knowledge appropriate CRANIAL NERVE: pupils equal and reactive to light, visual fields full to confrontation, extraocular muscles intact, no nystagmus, facial sensation and strength symmetric, hearing intact, palate elevates symmetrically, uvula midline, shoulder shrug symmetric, tongue midline. MOTOR: normal bulk and tone, full strength in the BUE, BLE SENSORY: normal and symmetric to light touch COORDINATION: finger-nose-finger, fine finger movements normal REFLEXES: deep tendon reflexes present and symmetric GAIT/STATION: narrow based gait; STABLE, SMOOTH     DIAGNOSTIC DATA (LABS, IMAGING, TESTING) - I reviewed patient records, labs, notes, testing and imaging myself where available.  Lab Results  Component Value Date   WBC 10.0 03/28/2017   HGB 10.9 (L) 03/28/2017   HCT 37.1 03/28/2017   MCV 72 (L) 03/28/2017   PLT 306 03/28/2017      Component Value Date/Time   NA 139 03/28/2017 1645   K 4.7 03/28/2017 1645   CL 104 03/28/2017 1645   CO2 23 03/28/2017 1645   GLUCOSE 91 03/28/2017 1645   GLUCOSE 100 (H) 11/11/2015 0828   BUN 12 03/28/2017 1645   CREATININE 0.72 03/28/2017 1645   CREATININE 0.75 11/11/2015 0828   CALCIUM 10.7 (H) 04/06/2017 1243   PROT 7.6 03/28/2017 1645   ALBUMIN 4.3 03/28/2017 1645   AST 14 03/28/2017 1645   ALT 20 03/28/2017 1645   ALKPHOS 90 03/28/2017 1645   BILITOT <0.2 03/28/2017 1645   GFRNONAA 100 03/28/2017 1645   GFRNONAA  >89 11/11/2015 0828   GFRAA 115 03/28/2017 1645   GFRAA >89 11/11/2015 0828   Lab Results  Component Value Date   CHOL 165 03/28/2017   HDL 48 03/28/2017   LDLCALC 94 03/28/2017   TRIG 115 03/28/2017  CHOLHDL 3.4 03/28/2017   Lab Results  Component Value Date   HGBA1C 5.9 (H) 03/28/2017   No results found for: [id number] Lab Results  Component Value Date   TSH 1.490 03/28/2017   Vit D, 25-Hydroxy  Date Value Ref Range Status  03/28/2017 24.3 (L) 30.0 - 100.0 ng/mL Final    Comment:    Vitamin D deficiency has been defined by the [place] practice guideline as a level of serum 25-OH vitamin D less than 20 ng/mL (1,2). The Endocrine Society went on to further define vitamin D insufficiency as a level between 21 and 29 ng/mL (2). 1. IOM (Institute of Medicine). 2010. Dietary reference    intakes for calcium and D. [place]: The    [employer]. 2. Holick MF, Binkley [place], Bischoff-Ferrari HA, et al.    Evaluation, treatment, and prevention of vitamin D    deficiency: an Endocrine Society clinical practice    guideline. JCEM. 2011 Jul; 96(7):1911-30.     10/17/14 MRI brain (without)  1. Multiple T2/FLAIR hyperintense lesions with associated restricted diffusion involving the white matter of both cerebral hemispheres, right greater than left, most consistent with active demyelinating disease. 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 Follow-up examination with postcontrast imaging could be performed for complete evaluation. 2. No other findings to suggest acute intracranial infarct or other process.  10/17/14 MRI cervical spine (with and without)  - Motion degraded exam demonstrating no convincing evidence for spinal MS.  Certainly there are no areas of T2 hyperintensity or cord enlargement. No disc  protrusion or spinal stenosis.  10/18/14 MRI brain (with) - Findings consistent with acute demyelination in the RIGHT posterior frontal subcortical white matter in this patient with suspected multiple sclerosis. Multiple other areas of T2 signal hyperintensity on the previous study do not display similar postcontrast enhancement. 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  07/08/15 MRI brain [I reviewed images myself and agree with interpretation. -VRP]  1. Regression of multiple sclerosis since December 2015. No active demyelination. 2. Tiny 5 mm right posterior convexity meningioma versus benign dural thickening. This is stable and appears inconsequential.  07/09/16 MRI brain [I reviewed images myself and agree with interpretation. -VRP]  - Progression of white matter hyperintensity in the right temporoparietal lobe consistent with progressive demyelinating disease. New area of demyelination in the left posterior frontal lobe is small measuring 5 mm. No evidence of acute demyelinization. - Stable small posterior right parietal meningioma.  10/21/14 CSF opening pressure 19cm H2O - (WBC 18, RBC 3545, protein 50, glucose 110, cryptococcal ag negative, IgG index 7.1 (normal), OCB > 5 well defined gamma restriction bands)  10/29/14 JCV ab - negative  08/28/15 JCV ab - negative  08/28/15 JCV AB: negative, 0.40  01/22/16 JCV positive 0.46  04/29/16 JCV 0.44 positive  04/29/16 anti-tysabri ab - POSITIVE     ASSESSMENT AND PLAN  46 y.o. 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 Was on tysabri since Jan 2016 and doing very well, until 2  infusion reactions in May 2017 and Jun 2017, with positive anti-tysabri antibodies. Now on copaxone (generic).  Dx:  MS (multiple sclerosis) ([place])  Other fatigue  OSA on CPAP     PLAN:  MULTIPLE SCLEROSIS  - continue copaxone; may consider gilenya or tecfidera in future - continue physical activity, PT exercises - repeat MRI brain  MIGRAINE HEADACHES - monitor as needed  JOINT ACHES / PAINS - improved; continue to optimize nutrition and fitness  Orders Placed This Encounter  Procedures  . MR BRAIN W WO CONTRAST   Return in about 6 months (around 01/30/2018).    Penni Bombard, MD 1/75/1025, 8:52 AM Certified in Neurology, Neurophysiology and Neuroimaging  [facility] [address], [place], [place] [address] [phone]

## 2017-08-03 ENCOUNTER — Ambulatory Visit (INDEPENDENT_AMBULATORY_CARE_PROVIDER_SITE_OTHER): Payer: 59 | Admitting: Physician Assistant

## 2017-08-03 VITALS — BP 130/86 | HR 82 | Temp 98.0°F | Resp 18 | Ht 63.0 in | Wt 287.0 lb

## 2017-08-03 DIAGNOSIS — B373 Candidiasis of vulva and vagina: Secondary | ICD-10-CM | POA: Diagnosis not present

## 2017-08-03 DIAGNOSIS — N760 Acute vaginitis: Secondary | ICD-10-CM | POA: Diagnosis not present

## 2017-08-03 DIAGNOSIS — B3731 Acute candidiasis of vulva and vagina: Secondary | ICD-10-CM

## 2017-08-03 DIAGNOSIS — B9689 Other specified bacterial agents as the cause of diseases classified elsewhere: Secondary | ICD-10-CM

## 2017-08-03 DIAGNOSIS — N898 Other specified noninflammatory disorders of vagina: Secondary | ICD-10-CM | POA: Diagnosis not present

## 2017-08-03 LAB — POCT WET + KOH PREP
Trich by wet prep: ABSENT
Yeast by KOH: ABSENT
Yeast by wet prep: ABSENT

## 2017-08-03 MED ORDER — FLUCONAZOLE 150 MG PO TABS: 150.0000 mg | ORAL_TABLET | Freq: Once | ORAL | 0 refills | Status: AC

## 2017-08-03 MED ORDER — METRONIDAZOLE 500 MG PO TABS: 500.0000 mg | ORAL_TABLET | Freq: Two times a day (BID) | ORAL | 0 refills | Status: DC

## 2017-08-03 NOTE — Progress Notes (Signed)
[name]  MRN: [id number] DOB: [birth year]  PCP: Mancel Bale, PA-C  Subjective:  Pt is a pleasant 46 year old female who presents to clinic for vaginal odor. She has h/o recurrent BV infections. About 6 months ago she was prescribed boric acid suppositories to reset her vaginal pH. She recently started this as she was having symptoms. She does not like the way the suppositories feel, causing her uneasiness/discomfort "it feels like it's not all the way up there and then it turns sideways". She has applied 4 doses.   She denies vaginal pain, discharge, urinary symptoms, back pain, abdominal pain.   Review of Systems  Constitutional: Negative for chills and fever.  Gastrointestinal: Negative for abdominal pain.  Genitourinary: Negative for dyspareunia, dysuria, flank pain, frequency, menstrual problem, pelvic pain, vaginal bleeding, vaginal discharge and vaginal pain.  Musculoskeletal: Negative for back pain.    Patient Active Problem List   Diagnosis Date Noted  . Other fatigue 08/05/2016  . Migraine without aura and without status migrainosus, not intractable 08/05/2016  . Prediabetes   . MS (multiple sclerosis) ([place])   . Obesity, Class III, BMI 40-49.9 (morbid obesity) ([place]) 10/16/2014  . Smoker 10/16/2014  . OSA on CPAP 10/16/2014  . HTN (hypertension) 01/14/2012  . Sickle cell trait ([place]) 01/14/2012    Current Outpatient Prescriptions on File Prior to Visit  Medication Sig Dispense Refill  . ALPRAZolam (XANAX) 0.5 MG tablet Take 1-1.5 tablets (0.5-0.75 mg total) by mouth 2 (two) times daily as needed for anxiety. 60 tablet 0  . Cholecalciferol 2000 units CAPS Take 1 capsule (2,000 Units total) by mouth daily. (Patient taking differently: Take 4,000 Units by mouth daily. ) 90 each 4  . Glatiramer Acetate 40 MG/ML SOSY INJECT 1 SYRINGE (40 MG) SUB-Q 3 TIMES A WEEK 12 mL 11  . telmisartan-hydrochlorothiazide (MICARDIS HCT) 80-12.5 MG tablet Take 1 tablet by mouth daily.  90 tablet 3   No current facility-administered medications on file prior to visit.     Allergies  Allergen Reactions  . Iron Shortness Of Breath    IV only   . Tysabri [Natalizumab] Other (See Comments)    Shortness of breath, joint pain, tremors  . Delsym [Dextromethorphan Polistirex Er] Other (See Comments)    nightmares  . Hydrocodone-Guaifenesin Other (See Comments)    nightmares  . Doxycycline Other (See Comments)    Bad taste to patient     Objective:  BP 130/86   Pulse 82   Temp 98 F (36.7 C) (Oral)   Resp 18   Ht 5\' 3"  (1.6 m)   Wt 287 lb (130.2 kg)   SpO2 97%   BMI 50.84 kg/m   Physical Exam  Constitutional: She is oriented to person, place, and time and well-developed, well-nourished, and in no distress. No distress.  Neurological: She is alert and oriented to person, place, and time. GCS score is 15.  Skin: Skin is warm and dry.  Psychiatric: Mood, memory, affect and judgment normal.  Vitals reviewed.  Results for orders placed or performed in visit on 08/03/17  POCT Wet + KOH Prep  Result Value Ref Range   Yeast by KOH Absent Absent   Yeast by wet prep Absent Absent   WBC by wet prep Few Few   Clue Cells Wet Prep HPF POC Too numerous to count  (A) None   Trich by wet prep Absent Absent   Bacteria Wet Prep HPF POC Moderate (A) Few  Epithelial Cells By [employer] Pref [facility]) Too numerous to count  None, Few, Too numerous to count   RBC,UR,HPF,POC None None RBC/hpf   Assessment and Plan :  1. Vaginal discharge 2. BV (bacterial vaginosis) - POCT Wet + KOH Prep - metroNIDAZOLE (FLAGYL) 500 MG tablet; Take 1 tablet (500 mg total) by mouth 2 (two) times daily with a meal. DO NOT CONSUME ALCOHOL WHILE TAKING THIS MEDICATION.  Dispense: 14 tablet; Refill: 0 - Positive clue cells. Will treat. Discussed with pt trying to get creative with suppository application in securing it in her vagina. She plans to try her suppositories again in the future. Consider prevention  therapy with metronidazole vaginal gel if needed.  3. Yeast vaginitis - fluconazole (DIFLUCAN) 150 MG tablet; Take 1 tablet (150 mg total) by mouth once. Repeat if needed  Dispense: 2 tablet; Refill: 0   Mercer Pod, PA-C  [facility] 08/03/2017 5:55 PM

## 2017-08-07 DIAGNOSIS — G4733 Obstructive sleep apnea (adult) (pediatric): Secondary | ICD-10-CM | POA: Diagnosis not present

## 2017-08-08 ENCOUNTER — Ambulatory Visit (HOSPITAL_COMMUNITY)
Admission: RE | Admit: 2017-08-08 | Discharge: 2017-08-08 | Disposition: A | Discharge status: Home / Self Care | Payer: 59 | Source: Ambulatory Visit | Attending: Diagnostic Neuroimaging | Admitting: Diagnostic Neuroimaging

## 2017-08-08 DIAGNOSIS — G35 Multiple sclerosis: Secondary | ICD-10-CM | POA: Diagnosis not present

## 2017-08-08 DIAGNOSIS — D320 Benign neoplasm of cerebral meninges: Secondary | ICD-10-CM | POA: Diagnosis not present

## 2017-08-08 LAB — POCT I-STAT CREATININE: CREATININE: 0.8 mg/dL (ref 0.44–1.00)

## 2017-08-08 MED ORDER — GADOBENATE DIMEGLUMINE 529 MG/ML IV SOLN
20.0000 mL | Freq: Once | INTRAVENOUS | Status: AC | PRN
  Administered 2017-08-08: 20 mL via INTRAVENOUS

## 2017-08-09 ENCOUNTER — Telehealth: Payer: Self-pay | Admitting: *Deleted

## 2017-08-09 NOTE — Telephone Encounter (Signed)
Spoke to pt and relayed her MRI brain results as per below.  Made appt for 08-15-17 at 1430 to talk about other options for MS treatment.  Pt verbalized understanding.

## 2017-08-09 NOTE — Telephone Encounter (Signed)
-----   Message from Penni Bombard, MD sent at 08/08/2017  5:03 PM EDT ----- Some new lesions on MRI on noted. I recommend to switch copaxone to ocrevus, tecfidera or gilenya. Pls setup call to discuss with me. -VRP

## 2017-08-10 ENCOUNTER — Ambulatory Visit (HOSPITAL_COMMUNITY): Payer: 59

## 2017-08-10 ENCOUNTER — Telehealth: Payer: Self-pay | Admitting: Diagnostic Neuroimaging

## 2017-08-10 NOTE — Telephone Encounter (Signed)
I spoke to pt and made appt for her on 08-15-17.  She is asking about her medication that will run out prior to appt and she is coming in for a possible change of MS treatment.  Ok to be off until started on something else or ot continue compaxone.

## 2017-08-10 NOTE — Telephone Encounter (Signed)
Continue copaxone for now. We will discuss further in office visit. -VRP

## 2017-08-10 NOTE — Telephone Encounter (Signed)
LMVM on her mobile that will continue copaxone for now.  Will see 08-15-17 at 1430 as discussed yesterday. She is to call back if questions.

## 2017-08-10 NOTE — Telephone Encounter (Signed)
Pt called in she has 2 injection of copaxone left. Is she to use those or to wait until her appt on 10/8 to discuss starting on the new medication. Please call to advise

## 2017-08-14 ENCOUNTER — Other Ambulatory Visit: Payer: Self-pay | Admitting: *Deleted

## 2017-08-14 DIAGNOSIS — N898 Other specified noninflammatory disorders of vagina: Secondary | ICD-10-CM

## 2017-08-14 MED ORDER — FLUCONAZOLE 150 MG PO TABS: 150.0000 mg | ORAL_TABLET | Freq: Once | ORAL | 0 refills | Status: AC

## 2017-08-15 ENCOUNTER — Encounter: Payer: Self-pay | Admitting: Diagnostic Neuroimaging

## 2017-08-15 ENCOUNTER — Ambulatory Visit (INDEPENDENT_AMBULATORY_CARE_PROVIDER_SITE_OTHER): Payer: 59 | Admitting: Diagnostic Neuroimaging

## 2017-08-15 VITALS — BP 127/80 | HR 89 | Wt 287.0 lb

## 2017-08-15 DIAGNOSIS — G35 Multiple sclerosis: Secondary | ICD-10-CM | POA: Diagnosis not present

## 2017-08-15 NOTE — Progress Notes (Signed)
[facility]  PATIENT: [name] DOB: 1971-05-23  REFERRING CLINICIAN:  HISTORY FROM: patient REASON FOR VISIT: follow up   HISTORICAL  CHIEF COMPLAINT:  Chief Complaint  Patient presents with  . Multiple Sclerosis    rm 7, discuss MS medications  . Follow-up    HISTORY OF PRESENT ILLNESS:   UPDATE (08/15/17, VRP): Since last visit, Had MRI of the brain which showed new chronic demyelinating plaques. Also one left temporal lesion had slight contrast enhancement. Patient was asked to come in today to discuss medication options. Patient has had some cognitive foggy sensation since hearing of knees that her MRI had progressed. Patient thinks this is related to her worrying about her MS progression. No new numbness or tingling. She has noticed her gait and balance is slightly off.  UPDATE (08/02/17, VRP): Since last visit, doing well. Tolerating copaxone. No alleviating or aggravating factors. No more headaches. Fatigue continues. Body aches are mild. Working 40+ hours per week.  UPDATE 01/06/17: Since last visit, MS sxs stable. HA resolved, so stopped TPX. Only using rizatriptan as needed. Overall doing well. Some work related stress. Tolerating copaxone generic. Some injection site reactions in legs, so she is only injecting in arms/stomach/hips, and doing well.   UPDATE 08/05/16: Since last visit, has transitioned to copaxone since Aug 2017. MRI brain reviewed. Having some intermittent HA, esp with weather changes. HA --> bitemporal, retro-orbital, aching, + photophobia, - phonophobia, -N/V; last for 1-2 days; happening 1 per week. These HA have been present since 2014. Never been on preventative meds. Has tried maxalt in 2014 with some benefit.  UPDATE 04/29/16: Since last visit, had possible infusion reaction on 03/30/16 here at infusion center (shaking, chills, malaise), in setting of URI and recently starting z-pack. Given benedryl and solumedrol, then went to ER  for observation. Had full recovery. This past week, had tysabri infusion last Wednesday (04/27/16) and was doing well initially, until she developed joint pain (knees, shoulder). Then infusion stopped. Then patient returned home, then developed shaking chills, and went to urgent care for observation. Now doing well. Here to discuss other MS med options.  UPDATE 01/22/16: Since last visit, doing well. Some fatigue on busy work days (Mon and Fri). No new neuro sxs. Tolerating tysabri. Some joint aches pains in arms and wrists.   UPDATE 09/07/15: Since last visit, doing well. Tolerating tysabri. No new events neurologically. Some more leg swelling lately, esp with sitting for long time at work. 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  UPDATE 06/02/15: Doing well. Strength good. No numbness. Fatigue better. Concentration is better. Intermittent word finding diff (scanning speech). Tolerating tysabri infusions. 3 weeks ago noted fine small bumps on arms, mild itching. Some int headaches in temporal regions, mild.   UPDATE 03/03/15: Since last visit, more right hip and low back pain. Intermittent. Had tysabri on March 14 and Apr 11. Overall c/o fatigue, focusing issues. 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  UPDATE 12/01/14: Since last visit, left side weakness has improved. Now having more problems with right knee pain, esp walking longer distances. Awaiting tysabri scheduling.   PRIOR HPI (10/29/14): 46 year old right-handed female here for evaluation of multiple sclerosis. 10/15/14 patient developed staggering to the left side. The next day she noted left arm and left leg heaviness and weakness. Patient was in the hospital and had evaluation. MRI of the brain demonstrated multiple  brain lesions suspicious for demyelinating disease. Patient was treated with IV steroids and her left sided symptoms have almost completely resolved. Patient continues to  have a slight limp with left leg difficulty. Also has some slight incoordination with left hand. In the past patient has had intermittent headaches. Patient has also had intermittent episodes of knee pain and intermittent vertigo attacks. Patient has family history of multiple sclerosis in maternal uncle and maternal cousin.    REVIEW OF SYSTEMS: Full 14 system review of systems performed and negative except: foggy sensation.    ALLERGIES: Allergies  Allergen Reactions  . Iron Shortness Of Breath    IV only   . Tysabri [Natalizumab] Other (See Comments)    Shortness of breath, joint pain, tremors  . Delsym [Dextromethorphan Polistirex Er] Other (See Comments)    nightmares  . Hydrocodone-Guaifenesin Other (See Comments)    nightmares  . Doxycycline Other (See Comments)    Bad taste to patient    HOME MEDICATIONS: Outpatient Medications Prior to Visit  Medication Sig Dispense Refill  . ALPRAZolam (XANAX) 0.5 MG tablet Take 1-1.5 tablets (0.5-0.75 mg total) by mouth 2 (two) times daily as needed for anxiety. 60 tablet 0  . Cholecalciferol 2000 units CAPS Take 1 capsule (2,000 Units total) by mouth daily. (Patient taking differently: Take 4,000 Units by mouth daily. ) 90 each 4  . telmisartan-hydrochlorothiazide (MICARDIS HCT) 80-12.5 MG tablet Take 1 tablet by mouth daily. 90 tablet 3  . Glatiramer Acetate 40 MG/ML SOSY INJECT 1 SYRINGE (40 MG) SUB-Q 3 TIMES A WEEK (Patient not taking: Reported on 08/15/2017) 12 mL 11  . metroNIDAZOLE (FLAGYL) 500 MG tablet Take 1 tablet (500 mg total) by mouth 2 (two) times daily with a meal. DO NOT CONSUME ALCOHOL WHILE TAKING THIS MEDICATION. 14 tablet 0   No facility-administered medications prior to visit.     PAST MEDICAL HISTORY: Past Medical History:  Diagnosis Date  . Allergy   . Anemia   . Anxiety   . Clotting disorder ([place])    from beeing anemic  . Depression   . Hypertension   . Iron overload, transfusional   . Migraine   . MS  (multiple sclerosis) ([place])   . OSA on CPAP   . Sickle cell anemia (H[facility])    trait  . Sickle cell trait ([place])   . Smoker   . Tachycardia     PAST SURGICAL HISTORY: Past Surgical History:  Procedure Laterality Date  . CERVICAL ABLATION    . CESAREAN SECTION     3 times  . CHOLECYSTECTOMY    . TUBAL LIGATION      FAMILY HISTORY: Family History  Problem Relation Age of Onset  . Mental illness Mother   . Hyperlipidemia Mother   . Hyperthyroidism Mother   . Mental retardation Mother   . ADD / ADHD Son   . Diabetes Father   . Cancer Father   . Cancer Paternal Aunt        breast  . Arthritis Maternal Grandmother   . Diabetes Maternal Grandmother   . Hearing loss Maternal Grandmother        left hear when she was a child  . Hyperlipidemia Maternal Grandmother   . Hypertension Maternal Grandmother   . Alcohol abuse Maternal Grandfather   . Early death Maternal Grandfather   . Cancer Paternal Aunt        breast  . Multiple sclerosis Cousin     SOCIAL HISTORY:  Social  History   Social History  . Marital status: Single    Spouse name: N/A  . Number of children: 3  . 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 Smoking status: Current Every Day Smoker    Packs/day: 0.25    Years: 25.00    Types: Cigarettes    Last attempt to quit: 10/16/2014  . Smokeless tobacco: Never Used     Comment: 08/15/17 smoke 2-3 cigarettes/day or prn  . Alcohol use 0.0 oz/week     Comment: once maybe a month if that  . Drug use: No  . Sexual activity: Yes    Partners: Male    Birth control/ protection: Condom   Other Topics Concern  . Not on file   Social History Narrative   Patient lives at home with her family.   Caffeine Use: 1 cup daily   MA [facility] [place]     PHYSICAL EXAM  Vitals:   08/15/17 1436  BP: 127/80  Pulse: 89  Weight: 287 lb (130.2 kg)   Wt Readings from Last 3 Encounters:  08/15/17 287 lb  (130.2 kg)  08/03/17 287 lb (130.2 kg)  08/02/17 287 lb 3.2 oz (130.3 kg)   Body mass index is 50.84 kg/m.   Visual Acuity Screening   Right eye Left eye Both eyes  Without correction: 20/30 20/40   With correction:       No flowsheet data found.  GENERAL EXAM: Patient is in no distress; well developed, nourished and groomed; neck is supple  CARDIOVASCULAR: Regular rate and rhythm, no murmurs, no carotid bruits  NEUROLOGIC: MENTAL STATUS: awake, alert, language fluent, comprehension intact, naming intact, fund of knowledge appropriate CRANIAL NERVE: pupils equal and reactive to light, visual fields full to confrontation, extraocular muscles intact, no nystagmus, facial sensation and strength symmetric, hearing intact, palate elevates symmetrically, uvula midline, shoulder shrug symmetric, tongue midline. MOTOR: normal bulk and tone, full strength in the BUE, BLE SENSORY: normal and symmetric to light touch COORDINATION: finger-nose-finger, fine finger movements normal REFLEXES: deep tendon reflexes present and symmetric GAIT/STATION: narrow based gait; STABLE, SMOOTH     DIAGNOSTIC DATA (LABS, IMAGING, TESTING) - I reviewed patient records, labs, notes, testing and imaging myself where available.  Lab Results  Component Value Date   WBC 10.0 03/28/2017   HGB 10.9 (L) 03/28/2017   HCT 37.1 03/28/2017   MCV 72 (L) 03/28/2017   PLT 306 03/28/2017      Component Value Date/Time   NA 139 03/28/2017 1645   K 4.7 03/28/2017 1645   CL 104 03/28/2017 1645   CO2 23 03/28/2017 1645   GLUCOSE 91 03/28/2017 1645   GLUCOSE 100 (H) 11/11/2015 0828   BUN 12 03/28/2017 1645   CREATININE 0.80 08/08/2017 0719   CREATININE 0.75 11/11/2015 0828   CALCIUM 10.7 (H) 04/06/2017 1243   PROT 7.6 03/28/2017 1645   ALBUMIN 4.3 03/28/2017 1645   AST 14 03/28/2017 1645   ALT 20 03/28/2017 1645   ALKPHOS 90 03/28/2017 1645   BILITOT <0.2 03/28/2017 1645   GFRNONAA 100 03/28/2017 1645    GFRNONAA >89 11/11/2015 0828   GFRAA 115 03/28/2017 1645   GFRAA >89 11/11/2015 0828   Lab Results  Component Value Date   CHOL 165 03/28/2017   HDL 48 03/28/2017   LDLCALC 94 03/28/2017   TRIG 115 03/28/2017   CHOLHDL 3.4 03/28/2017  Lab Results  Component Value Date   HGBA1C 5.9 (H) 03/28/2017   No results found for: [id number] Lab Results  Component Value Date   TSH 1.490 03/28/2017   Vit D, 25-Hydroxy  Date Value Ref Range Status  03/28/2017 24.3 (L) 30.0 - 100.0 ng/mL Final    Comment:    Vitamin D deficiency has been defined by the [place] practice guideline as a level of serum 25-OH vitamin D less than 20 ng/mL (1,2). The Endocrine Society went on to further define vitamin D insufficiency as a level between 21 and 29 ng/mL (2). 1. IOM (Institute of Medicine). 2010. Dietary reference    intakes for calcium and D. [place]: The    [employer]. 2. Holick MF, [place], Bischoff-Ferrari HA, et al.    Evaluation, treatment, and prevention of vitamin D    deficiency: an Endocrine Society clinical practice    guideline. JCEM. 2011 Jul; 96(7):1911-30.     10/17/14 MRI brain (without)  1. Multiple T2/FLAIR hyperintense lesions with associated restricted diffusion involving the white matter of both cerebral hemispheres, right greater than left, most consistent with active demyelinating disease. 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 Follow-up examination with postcontrast imaging could be performed for complete evaluation. 2. No other findings to suggest acute intracranial infarct or other process.  10/17/14 MRI cervical spine (with and without)  - Motion degraded exam demonstrating no convincing evidence for spinal MS.  Certainly there are no areas of T2 hyperintensity or cord enlargement. No  disc protrusion or spinal stenosis.  10/18/14 MRI brain (with) - Findings consistent with acute demyelination in the RIGHT posterior frontal subcortical white matter in this patient with suspected multiple sclerosis. Multiple other areas of T2 signal hyperintensity on the previous study do not display similar postcontrast enhancement. 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  07/08/15 MRI brain [I reviewed images myself and agree with interpretation. -VRP]  1. Regression of multiple sclerosis since December 2015. No active demyelination. 2. Tiny 5 mm right posterior convexity meningioma versus benign dural thickening. This is stable and appears inconsequential.  07/09/16 MRI brain [I reviewed images myself and agree with interpretation. -VRP]  - Progression of white matter hyperintensity in the right temporoparietal lobe consistent with progressive demyelinating disease. New area of demyelination in the left posterior frontal lobe is small measuring 5 mm. No evidence of acute demyelinization. - Stable small posterior right parietal meningioma.  08/08/17 MRI brain [I reviewed images myself and agree with interpretation. -VRP]  1. Multiple sclerosis with 5 new or larger plaques in the cerebral white matter when compared to 07/09/2016. One of these, in the left temporal lobe, is enhancing. 2. Stable normal brain volume. 3. Stable 6 mm right parietal meningioma.  10/21/14 CSF opening pressure 19cm H2O - (WBC 18, RBC 3545, protein 50, glucose 110, cryptococcal ag negative, IgG index 7.1 (normal), OCB > 5 well defined gamma restriction bands)  10/29/14 JCV ab - negative  08/28/15 JCV ab - negative  08/28/15 JCV AB: negative, 0.40  01/22/16 JCV positive 0.46  04/29/16 JCV 0.44 positive  04/29/16 anti-tysabri ab - [place] PLAN  46 y.o. 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 Was on tysabri since Jan 2016 and doing very well, until 2 infusion reactions in May 2017 and Jun 2017, with positive anti-tysabri antibodies. Now on copaxone (generic) since Aug 2017.  Now with subclinical MRI progression in September 2018. We'll plan to transition Copaxone to [facility].   Dx:  MS (multiple sclerosis) ([place]) - Plan: Hep B Surface Antibody, Hep B Surface Antigen, Hepatitis B Core AB, Total, QuantiFERON-TB Gold Plus, CBC with Differential/Platelet, Comprehensive metabolic panel     PLAN:  MULTIPLE SCLEROSIS (established problem, worsening) - continue copaxone and start process to change to ocrevus; check labs and authorization process; will stop copaxone immediately prior to 1st dose ocrevus - continue physical activity, PT exercises  MIGRAINE HEADACHES - monitor as needed  JOINT ACHES / PAINS - improved; continue to optimize nutrition and fitness  MENINGIOMA (small, asymptomatic) - incidental finding; reassured patient  Orders Placed This Encounter  Procedures  . Hep B Surface Antibody  . Hep B Surface Antigen  . Hepatitis B Core AB, Total  . QuantiFERON-TB Gold Plus  . CBC with Differential/Platelet  . Comprehensive metabolic panel   Return in about 3 months (around 11/15/2017).    Penni Bombard, MD 84/03/3645, 8:03 PM Certified in Neurology, Neurophysiology and Neuroimaging  [facility] [address], [place], [place] [address] [phone]

## 2017-08-15 NOTE — Patient Instructions (Signed)
-   will plan to switch copaxone to ocrevus

## 2017-08-16 LAB — QUANTIFERON-TB GOLD PLUS

## 2017-08-18 LAB — CBC WITH DIFFERENTIAL/PLATELET
BASOS ABS: 0 10*3/uL (ref 0.0–0.2)
Basos: 0 %
EOS (ABSOLUTE): 0.3 10*3/uL (ref 0.0–0.4)
EOS: 3 %
HEMATOCRIT: 36.7 % (ref 34.0–46.6)
HEMOGLOBIN: 11.3 g/dL (ref 11.1–15.9)
IMMATURE GRANULOCYTES: 0 %
Immature Grans (Abs): 0 10*3/uL (ref 0.0–0.1)
LYMPHS ABS: 2.8 10*3/uL (ref 0.7–3.1)
Lymphs: 29 %
MCH: 21.2 pg — ABNORMAL LOW (ref 26.6–33.0)
MCHC: 30.8 g/dL — AB (ref 31.5–35.7)
MCV: 69 fL — ABNORMAL LOW (ref 79–97)
MONOCYTES: 6 %
Monocytes Absolute: 0.6 10*3/uL (ref 0.1–0.9)
NEUTROS PCT: 62 %
Neutrophils Absolute: 5.9 10*3/uL (ref 1.4–7.0)
Platelets: 288 10*3/uL (ref 150–379)
RBC: 5.34 x10E6/uL — AB (ref 3.77–5.28)
RDW: 18.7 % — AB (ref 12.3–15.4)
WBC: 9.7 10*3/uL (ref 3.4–10.8)

## 2017-08-18 LAB — QUANTIFERON IN TUBE
QFT TB AG MINUS NIL VALUE: <0 IU/mL
QUANTIFERON MITOGEN VALUE: >10 IU/mL
QUANTIFERON NIL VALUE: 0.04 [IU]/mL
QUANTIFERON TB AG VALUE: 0.03 IU/mL
QUANTIFERON TB GOLD: NEGATIVE

## 2017-08-18 LAB — COMPREHENSIVE METABOLIC PANEL
ALBUMIN: 4.2 g/dL (ref 3.5–5.5)
ALT: 14 IU/L (ref 0–32)
AST: 15 IU/L (ref 0–40)
Albumin/Globulin Ratio: 1.4 (ref 1.2–2.2)
Alkaline Phosphatase: 93 IU/L (ref 39–117)
BUN/Creatinine Ratio: 11 (ref 9–23)
BUN: 9 mg/dL (ref 6–24)
Bilirubin Total: <0.2 mg/dL (ref 0.0–1.2)
CALCIUM: 10.7 mg/dL — AB (ref 8.7–10.2)
CO2: 21 mmol/L (ref 20–29)
CREATININE: 0.79 mg/dL (ref 0.57–1.00)
Chloride: 104 mmol/L (ref 96–106)
GFR calc Af Amer: 103 mL/min/{1.73_m2} (ref >59)
GFR, EST NON AFRICAN AMERICAN: 89 mL/min/{1.73_m2} (ref >59)
GLOBULIN, TOTAL: 3.1 g/dL (ref 1.5–4.5)
GLUCOSE: 82 mg/dL (ref 65–99)
Potassium: 4.4 mmol/L (ref 3.5–5.2)
SODIUM: 139 mmol/L (ref 134–144)
Total Protein: 7.3 g/dL (ref 6.0–8.5)

## 2017-08-18 LAB — HEPATITIS B CORE ANTIBODY, TOTAL: Hep B Core Total Ab: NEGATIVE

## 2017-08-18 LAB — HEPATITIS B SURFACE ANTIGEN: Hepatitis B Surface Ag: NEGATIVE

## 2017-08-18 LAB — HEPATITIS B SURFACE ANTIBODY,QUALITATIVE: Hep B Surface Ab, Qual: NONREACTIVE

## 2017-08-18 LAB — QUANTIFERON TB GOLD ASSAY (BLOOD)

## 2017-08-29 ENCOUNTER — Telehealth: Payer: Self-pay | Admitting: *Deleted

## 2017-08-29 NOTE — Telephone Encounter (Signed)
LVM requesting call back re: lab results, information.

## 2017-08-30 NOTE — Telephone Encounter (Signed)
Received call back from patient, informed her that her labs are okay. Advised her Dr Leta Baptist plans to switch her to [facility], and this RN spoke with Otila Kluver, infusion RN yesterday afternoon. Advised patient she will get a call, but Ocrevus will not be started for several weeks. She stated Tina may LVM. She verbalized understanding of call. Informed Otila Kluver of conversation.

## 2017-09-04 ENCOUNTER — Encounter: Payer: Self-pay | Admitting: Diagnostic Neuroimaging

## 2017-09-08 ENCOUNTER — Telehealth: Payer: Self-pay

## 2017-09-08 DIAGNOSIS — F430 Acute stress reaction: Secondary | ICD-10-CM

## 2017-09-08 MED ORDER — ALPRAZOLAM 0.5 MG PO TABS: 0.5000 mg | ORAL_TABLET | Freq: Two times a day (BID) | ORAL | 0 refills | Status: DC | PRN

## 2017-09-08 NOTE — Telephone Encounter (Signed)
Meds ordered this encounter  Medications  . ALPRAZolam (XANAX) 0.5 MG tablet    Sig: Take 1-1.5 tablets (0.5-0.75 mg total) by mouth 2 (two) times daily as needed for anxiety.    Dispense:  60 tablet    Refill:  0    Order Specific Question:   Supervising Provider    Answer:   Brigitte Pulse, EVA N [[id number]]   Hand delivered to patient

## 2017-09-08 NOTE — Telephone Encounter (Signed)
paitent requesting refill on her xanax. Pls Advise

## 2017-09-12 ENCOUNTER — Encounter: Payer: Self-pay | Admitting: Diagnostic Neuroimaging

## 2017-09-18 ENCOUNTER — Encounter: Payer: Self-pay | Admitting: Diagnostic Neuroimaging

## 2017-09-18 DIAGNOSIS — G35 Multiple sclerosis: Secondary | ICD-10-CM | POA: Diagnosis not present

## 2017-09-19 ENCOUNTER — Emergency Department (HOSPITAL_COMMUNITY)
Admission: EM | Admit: 2017-09-19 | Discharge: 2017-09-19 | Disposition: A | Discharge status: Home / Self Care | Payer: 59 | Attending: Emergency Medicine | Admitting: Emergency Medicine

## 2017-09-19 ENCOUNTER — Other Ambulatory Visit: Payer: Self-pay

## 2017-09-19 ENCOUNTER — Ambulatory Visit (INDEPENDENT_AMBULATORY_CARE_PROVIDER_SITE_OTHER): Payer: 59

## 2017-09-19 ENCOUNTER — Encounter (HOSPITAL_COMMUNITY): Payer: Self-pay

## 2017-09-19 ENCOUNTER — Encounter: Payer: Self-pay | Admitting: Physician Assistant

## 2017-09-19 ENCOUNTER — Telehealth: Payer: Self-pay | Admitting: *Deleted

## 2017-09-19 ENCOUNTER — Telehealth: Payer: Self-pay | Admitting: Physician Assistant

## 2017-09-19 ENCOUNTER — Ambulatory Visit (INDEPENDENT_AMBULATORY_CARE_PROVIDER_SITE_OTHER): Payer: 59 | Admitting: Physician Assistant

## 2017-09-19 VITALS — BP 140/100 | HR 148 | Temp 98.9°F | Resp 18 | Ht 63.0 in | Wt 289.2 lb

## 2017-09-19 DIAGNOSIS — T887XXA Unspecified adverse effect of drug or medicament, initial encounter: Secondary | ICD-10-CM | POA: Insufficient documentation

## 2017-09-19 DIAGNOSIS — T450X5A Adverse effect of antiallergic and antiemetic drugs, initial encounter: Secondary | ICD-10-CM | POA: Diagnosis not present

## 2017-09-19 DIAGNOSIS — F17210 Nicotine dependence, cigarettes, uncomplicated: Secondary | ICD-10-CM | POA: Diagnosis not present

## 2017-09-19 DIAGNOSIS — T50905A Adverse effect of unspecified drugs, medicaments and biological substances, initial encounter: Secondary | ICD-10-CM | POA: Insufficient documentation

## 2017-09-19 DIAGNOSIS — R0600 Dyspnea, unspecified: Secondary | ICD-10-CM | POA: Diagnosis not present

## 2017-09-19 DIAGNOSIS — R079 Chest pain, unspecified: Secondary | ICD-10-CM | POA: Diagnosis not present

## 2017-09-19 DIAGNOSIS — Z79899 Other long term (current) drug therapy: Secondary | ICD-10-CM | POA: Insufficient documentation

## 2017-09-19 DIAGNOSIS — T380X5A Adverse effect of glucocorticoids and synthetic analogues, initial encounter: Secondary | ICD-10-CM | POA: Diagnosis not present

## 2017-09-19 DIAGNOSIS — R0609 Other forms of dyspnea: Secondary | ICD-10-CM

## 2017-09-19 DIAGNOSIS — I10 Essential (primary) hypertension: Secondary | ICD-10-CM | POA: Diagnosis not present

## 2017-09-19 DIAGNOSIS — R0789 Other chest pain: Secondary | ICD-10-CM

## 2017-09-19 DIAGNOSIS — Y829 Unspecified medical devices associated with adverse incidents: Secondary | ICD-10-CM | POA: Insufficient documentation

## 2017-09-19 DIAGNOSIS — R0602 Shortness of breath: Secondary | ICD-10-CM | POA: Diagnosis not present

## 2017-09-19 LAB — CBC
HCT: 34.9 % — ABNORMAL LOW (ref 36.0–46.0)
HEMOGLOBIN: 11.2 g/dL — AB (ref 12.0–15.0)
MCH: 21.7 pg — ABNORMAL LOW (ref 26.0–34.0)
MCHC: 32.1 g/dL (ref 30.0–36.0)
MCV: 67.8 fL — ABNORMAL LOW (ref 78.0–100.0)
PLATELETS: 309 10*3/uL (ref 150–400)
RBC: 5.15 MIL/uL — AB (ref 3.87–5.11)
RDW: 19.2 % — ABNORMAL HIGH (ref 11.5–15.5)
WBC: 20.6 10*3/uL — AB (ref 4.0–10.5)

## 2017-09-19 LAB — BASIC METABOLIC PANEL
ANION GAP: 8 (ref 5–15)
BUN: 11 mg/dL (ref 6–20)
CALCIUM: 10.6 mg/dL — AB (ref 8.9–10.3)
CO2: 21 mmol/L — ABNORMAL LOW (ref 22–32)
CREATININE: 0.87 mg/dL (ref 0.44–1.00)
Chloride: 109 mmol/L (ref 101–111)
GFR calc non Af Amer: >60 mL/min (ref >60)
Glucose, Bld: 97 mg/dL (ref 65–99)
Potassium: 3.7 mmol/L (ref 3.5–5.1)
SODIUM: 138 mmol/L (ref 135–145)

## 2017-09-19 LAB — I-STAT TROPONIN, ED: TROPONIN I, POC: 0 ng/mL (ref 0.00–0.08)

## 2017-09-19 NOTE — Progress Notes (Signed)
Patient ID: [name], female    DOB: Jun 24, 1971, 46 y.o.   MRN: [id number]  PCP: Mancel Bale, PA-C  Chief Complaint  Patient presents with  . Shortness of Breath    x1 day, pt states she has some dizziness, shakiness, pt states it is hard to take a breathe in and that she isn't getting enough air.  . Chest Pressure    x1 day, pt states it is just pressure, no pain. Pt states her chest feels heavy.    Subjective:   Presents for evaluation of dyspnea and chest pressure with exertion that began yesterday during infusion of new treatment of MS.  She had good symptom relief with Copaxone, but had developed several new lesions on most recent MRI. Yesterday she had her first Ocrevus. She received recommended pre-treatment, and about half-way through developed itching and fullness in her throat. She received IV Solumedrol and diphenhydramine with good relief.  Following the infusion, she went home, feeling very tired, and napped for about 4 hours. When she awoke, she noted dyspnea with minimal exertion. Just walking a few steps causes SOB and chest pressure on the left. When she sits down to rest, the pressure resolves, but she feels as though she can't take a full breath. These symptoms persisted throughout the evening and remains present this morning.  She has dizziness at baseline, but not worse. No other new symptoms.  She has had a URI for the past several weeks, improving, but with persistent mild cough. The cough has not worsened since her infusion yesterday.   Review of Systems  Constitutional: Positive for fatigue. Negative for chills and fever.  Respiratory: Positive for cough, chest tightness and shortness of breath. Negative for wheezing.   Cardiovascular: Negative for chest pain, palpitations and leg swelling.  Neurological: Positive for dizziness.       Patient Active Problem List   Diagnosis Date Noted  . Other fatigue 08/05/2016  . Migraine without aura and  without status migrainosus, not intractable 08/05/2016  . Prediabetes   . MS (multiple sclerosis) ([place])   . Obesity, Class III, BMI 40-49.9 (morbid obesity) ([place]) 10/16/2014  . Smoker 10/16/2014  . OSA on CPAP 10/16/2014  . HTN (hypertension) 01/14/2012  . Sickle cell trait ([place]) 01/14/2012     Prior to Admission medications   Medication Sig Start Date End Date Taking? Authorizing Provider  ALPRAZolam Duanne Moron) 0.5 MG tablet Take 1-1.5 tablets (0.5-0.75 mg total) by mouth 2 (two) times daily as needed for anxiety. 09/08/17  Yes Harrison Mons, PA-C  Cholecalciferol 2000 units CAPS Take 1 capsule (2,000 Units total) by mouth daily. Patient taking differently: Take 4,000 Units by mouth daily.  11/24/16  Yes Weber, Damaris Hippo, PA-C  telmisartan-hydrochlorothiazide (MICARDIS HCT) 80-12.5 MG tablet Take 1 tablet by mouth daily. 05/19/17  Yes Weber, Damaris Hippo, PA-C  Glatiramer Acetate 40 MG/ML SOSY INJECT 1 SYRINGE (40 MG) SUB-Q 3 TIMES A WEEK Patient not taking: Reported on 08/15/2017 05/16/17   Penumalli, Earlean Polka, MD     Allergies  Allergen Reactions  . Iron Shortness Of Breath    IV only   . Tysabri [Natalizumab] Other (See Comments)    Shortness of breath, joint pain, tremors  . Delsym [Dextromethorphan Polistirex Er] Other (See Comments)    nightmares  . Hydrocodone-Guaifenesin Other (See Comments)    nightmares  . Doxycycline Other (See Comments)    Bad taste to patient       Objective:  Physical Exam  Constitutional: She is oriented to person, place, and time. She appears well-developed and well-nourished. She is active and cooperative. No distress.  BP (!) 140/100 (BP Location: Left Arm, Patient Position: Sitting, Cuff Size: Large)   Pulse (!) 148   Temp 98.9 F (37.2 C)   Resp 18   Ht 5\' 3"  (1.6 m)   Wt 289 lb 3.2 oz (131.2 kg)   LMP 09/18/2017   SpO2 98%   BMI 51.23 kg/m   HENT:  Head: Normocephalic and atraumatic.  Right Ear: Hearing normal.  Left Ear: Hearing normal.    Eyes: Conjunctivae are normal. No scleral icterus.  Neck: Normal range of motion. Neck supple. No thyromegaly present.  Cardiovascular: Normal rate, regular rhythm and normal heart sounds.  Pulses:      Radial pulses are 2+ on the right side, and 2+ on the left side.  Pulmonary/Chest: Effort normal and breath sounds normal.  Patient walked in the office. Pulse ox remained 98, but pulse rate increased to 140.  Lymphadenopathy:       Head (right side): No tonsillar, no preauricular, no posterior auricular and no occipital adenopathy present.       Head (left side): No tonsillar, no preauricular, no posterior auricular and no occipital adenopathy present.    She has no cervical adenopathy.       Right: No supraclavicular adenopathy present.       Left: No supraclavicular adenopathy present.  Neurological: She is alert and oriented to person, place, and time. No sensory deficit.  Skin: Skin is warm, dry and intact. No rash noted. No cyanosis or erythema. Nails show no clubbing.  Psychiatric: She has a normal mood and affect. Her speech is normal and behavior is normal.    EKG reviewed with Dr. Pamella Pert. NSR. Rate 76 bpm. PR 146. QT 356. Compared to tracing 10/17/2014, at which time rate was 106, no concerning changes.  Dg Chest 2 View  Result Date: 09/19/2017 CLINICAL DATA:  Dyspnea EXAM: CHEST  2 VIEW COMPARISON:  11/11/2015 FINDINGS: The lungs are clear without focal pneumonia, edema, pneumothorax or pleural effusion. The cardiopericardial silhouette is within normal limits for size. The visualized bony structures of the thorax are intact. IMPRESSION: Stable.  No acute findings. Electronically Signed   By: Misty Stanley M.D.   On: 09/19/2017 11:34      Assessment & Plan:   1. Dyspnea on exertion 2. Chest tightness or pressure Possibly some anxiety associated with infusion and reaction yesterday, but cannot explain tachycardia with exertion. Spoke with neurology, advised that this is not  to be expected from the infusion. Patient to ED. Nurse Janett Billow alerted by phone. - EKG 12-Lead - DG Chest 2 View     Return in about 1 week (around 09/26/2017), or if symptoms worsen or fail to improve following evaluation in the emergency department.   Fara Chute, PA-C [facility]

## 2017-09-19 NOTE — Telephone Encounter (Signed)
Spoke to [place], [place] about this pt.

## 2017-09-19 NOTE — Telephone Encounter (Signed)
Most infusion reactions occur during or immediately following the infusion. She has had some anxiety associated with infusions previously, and certainly may be experiencing that now, but chest tightness and DOE are not expected at this point.

## 2017-09-19 NOTE — ED Provider Notes (Signed)
[facility] EMERGENCY DEPARTMENT Provider Note   CSN: [id number] Arrival date & time: 09/19/17  1307     History   Chief Complaint Chief Complaint  Patient presents with  . Shortness of Breath  . Medication Reaction    HPI [name] is a 46 y.o. female.  Patient is a 46 year old female with a history of hypertension, MS, anemia, tobacco abuse presenting today with exertional dyspnea.  Patient states she was getting a new infusion for her MS yesterday which finished by noon.  When she woke up at 4:00 after a nap and tried to go to the bathroom she became extremely winded and had chest tightness.  This resolved as soon as she sat down and rested.  This continued throughout the night and was there this morning.  She attempted to go to work and could not walk more than 25 feet before she became winded.  It always improved with sitting down.  She denied cough, fever, congestion.  She has had no itching, rashes or abdominal pain.  She has had no nausea or vomiting.  She states initially when she was getting the infusion she had a sore throat and feeling like it was difficult swallow but that resolved with Benadryl and Solu-Medrol during the infusion.  She was seen in the office today and had a normal chest x-ray and EKG but was sent here for further evaluation.  She states on the walk from the waiting room back to the room suddenly her symptoms have resolved.  She was no longer short of breath with exertion and feels back to her normal self.   The history is provided by the patient.    Past Medical History:  Diagnosis Date  . Allergy   . Anemia   . Anxiety   . Clotting disorder ([place])    from beeing anemic  . Depression   . Hypertension   . Iron overload, transfusional   . Migraine   . MS (multiple sclerosis) ([place])   . OSA on CPAP   . Sickle cell anemia (H[facility])    trait  . Sickle cell trait ([place])   . Smoker   . Tachycardia     Patient Active Problem List   Diagnosis Date Noted  . Other fatigue 08/05/2016  . Migraine without aura and without status migrainosus, not intractable 08/05/2016  . Prediabetes   . MS (multiple sclerosis) ([place] [place])   . Obesity, Class III, BMI 40-49.9 (morbid obesity) ([place]) 10/16/2014  . Smoker 10/16/2014  . OSA on CPAP 10/16/2014  . HTN (hypertension) 01/14/2012  . Sickle cell trait ([place]) 01/14/2012    Past Surgical History:  Procedure Laterality Date  . CERVICAL ABLATION    . CESAREAN SECTION     3 times  . CHOLECYSTECTOMY    . TUBAL LIGATION      OB History    No data available       Home Medications    Prior to Admission medications   Medication Sig Start Date End Date Taking? Authorizing Provider  Cholecalciferol 2000 units CAPS Take 1 capsule (2,000 Units total) by mouth daily. Patient taking differently: Take 4,000 Units by mouth daily.  11/24/16  Yes Weber, Damaris Hippo, PA-C  telmisartan-hydrochlorothiazide (MICARDIS HCT) 80-12.5 MG tablet Take 1 tablet by mouth daily. 05/19/17  Yes Weber, Sarah L, PA-C  ALPRAZolam Duanne Moron) 0.5 MG tablet Take 1-1.5 tablets (0.5-0.75 mg total) by mouth 2 (two) times daily as needed for anxiety. 09/08/17  Harrison Mons, PA-C    Family History Family History  Problem Relation Age of Onset  . Mental illness Mother   . Hyperlipidemia Mother   . Hyperthyroidism Mother   . Mental retardation Mother   . ADD / ADHD Son   . Diabetes Father   . Cancer Father   . Cancer Paternal Aunt        breast  . Arthritis Maternal Grandmother   . Diabetes Maternal Grandmother   . Hearing loss Maternal Grandmother        left hear when she was a child  . Hyperlipidemia Maternal Grandmother   . Hypertension Maternal Grandmother   . Alcohol abuse Maternal Grandfather   . Early death Maternal Grandfather   . Cancer Paternal Aunt        breast  . Multiple sclerosis Cousin     Social History Social History   Tobacco Use  . Smoking status: Current Every Day Smoker    Packs/day:  0.25    Years: 25.00    Pack years: 6.25    Types: Cigarettes    Last attempt to quit: 10/16/2014    Years since quitting: 2.9  . Smokeless tobacco: Never Used  . Tobacco comment: 08/15/17 smoke 2-3 cigarettes/day or prn  Substance Use Topics  . Alcohol use: Yes    Alcohol/week: 0.0 oz    Comment: once maybe a month if that  . Drug use: No     Allergies   Iron; Tysabri [natalizumab]; Delsym [dextromethorphan polistirex er]; Hydrocodone-guaifenesin; and Doxycycline   Review of Systems Review of Systems  All other systems reviewed and are negative.    Physical Exam Updated Vital Signs BP 139/77   Pulse 78   Temp 98.6 F (37 C) (Oral)   Resp 17   LMP 09/18/2017   SpO2 100%   Physical Exam  Constitutional: She is oriented to person, place, and time. She appears well-developed and well-nourished. No distress.  HENT:  Head: Normocephalic and atraumatic.  Mouth/Throat: Oropharynx is clear and moist.  Eyes: Conjunctivae and EOM are normal. Pupils are equal, round, and reactive to light.  Neck: Normal range of motion. Neck supple.  Cardiovascular: Normal rate, regular rhythm and intact distal pulses.  No murmur heard. Pulmonary/Chest: Effort normal and breath sounds normal. No respiratory distress. She has no wheezes. She has no rales.  Abdominal: Soft. She exhibits no distension. There is no tenderness. There is no rebound and no guarding.  Musculoskeletal: Normal range of motion. She exhibits no edema or tenderness.  Neurological: She is alert and oriented to person, place, and time.  Skin: Skin is warm and dry. No rash noted. No erythema.  Psychiatric: She has a normal mood and affect. Her behavior is normal.  Nursing note and vitals reviewed.    ED Treatments / Results  Labs (all labs ordered are listed, but only abnormal results are displayed) Labs Reviewed  BASIC METABOLIC PANEL - Abnormal; Notable for the following components:      Result Value   CO2 21 (*)      Calcium 10.6 (*)    All other components within normal limits  CBC - Abnormal; Notable for the following components:   WBC 20.6 (*)    RBC 5.15 (*)    Hemoglobin 11.2 (*)    HCT 34.9 (*)    MCV 67.8 (*)    MCH 21.7 (*)    RDW 19.2 (*)    All other components within normal limits  I-STAT  TROPONIN, ED    EKG  EKG Interpretation  Date/Time:  Tuesday September 19 2017 13:13:13 EST Ventricular Rate:  87 PR Interval:  144 QRS Duration: 74 QT Interval:  326 QTC Calculation: 392 R Axis:   28 Text Interpretation:  Sinus rhythm with marked sinus arrhythmia Otherwise normal ECG No significant change since last tracing Confirmed by Blanchie Dessert [phone]) on 09/19/2017 4:20:19 PM       Radiology Dg Chest 2 View  Result Date: 09/19/2017 CLINICAL DATA:  Dyspnea EXAM: CHEST  2 VIEW COMPARISON:  11/11/2015 FINDINGS: The lungs are clear without focal pneumonia, edema, pneumothorax or pleural effusion. The cardiopericardial silhouette is within normal limits for size. The visualized bony structures of the thorax are intact. IMPRESSION: Stable.  No acute findings. Electronically Signed   By: Misty Stanley M.D.   On: 09/19/2017 11:34    Procedures Procedures (including critical care time)  Medications Ordered in ED Medications - No data to display   Initial Impression / Assessment and Plan / ED Course  I have reviewed the triage vital signs and the nursing notes.  Pertinent labs & imaging results that were available during my care of the patient were reviewed by me and considered in my medical decision making (see chart for details).    Patient presented with shortness of breath over the last 24 hours.  It was only on exertion and she would get short of breath and then developed chest tightness.  This always improved with rest.  She has never had anything like this before and it started approximately 4 hours after getting a new infusion of a drug that she is taking for MS.  Patient  does have a history of being allergic to medications.   She did receive Solu-Medrol and Benadryl yesterday during the infusion for some reactions.  However she denies any wheezing, feeling that her throat was closing or rashes.  Prior to walking back to the exam room she noted that her symptoms have resolved.  She was able to walk from the waiting room to the Pod E without being winded.  After getting here she was walking multiple times and symptoms never returned.  Troponin, EKG are within normal limits.  Labs do show a leukocytosis but otherwise normal BMP and CBC.  Leukocytosis is most likely related to recent infusion and IV steroids.  Patient is well-appearing.  Suspicion is that this is a reaction to the medication.  Patient was given strict return precautions but at this time low suspicion for MI, dissection, PE or abdominal process.  Final Clinical Impressions(s) / ED Diagnoses   Final diagnoses:  Adverse effect of drug, initial encounter    ED Discharge Orders    None       Blanchie Dessert, MD 09/19/17 1756

## 2017-09-19 NOTE — Patient Instructions (Addendum)
Please go directly to the emergency department. We need to be certain that the symptoms are not your heart.    IF you received an x-ray today, you will receive an invoice from [facility]. Please contact [facility] at [phone] with questions or concerns regarding your invoice.   IF you received labwork today, you will receive an invoice from [facility]. Please contact [facility] at [phone] with questions or concerns regarding your invoice.   Our billing staff will not be able to assist you with questions regarding bills from these companies.  You will be contacted with the lab results as soon as they are available. The fastest way to get your results is to activate your My Chart account. Instructions are located on the last page of this paperwork. If you have not heard from [place] regarding the results in 2 weeks, please contact this office.

## 2017-09-19 NOTE — ED Notes (Signed)
Pt escorted around Pod twice without complication. RN noted pt HR to be 120-126 and respirations to be as high as 32 while ambulating. Pt did not express any SOB or distress.

## 2017-09-19 NOTE — Telephone Encounter (Signed)
Wanting to speak to Dr. Leta Baptist about pt. (who is in the office now).

## 2017-09-19 NOTE — ED Triage Notes (Signed)
Pt began taking Ocrevus via IV infusion yesterday. She got home and began having some chest pressure and shortness of breath, especially with exertion. No acute distress noted at this time. Pt AOX4. Skin warm and dry, lung sounds clear on auscultation.

## 2017-09-20 ENCOUNTER — Encounter: Payer: Self-pay | Admitting: Diagnostic Neuroimaging

## 2017-09-27 ENCOUNTER — Encounter: Payer: Self-pay | Admitting: Diagnostic Neuroimaging

## 2017-10-02 DIAGNOSIS — G35 Multiple sclerosis: Secondary | ICD-10-CM | POA: Diagnosis not present

## 2017-10-28 ENCOUNTER — Encounter: Payer: Self-pay | Admitting: Family Medicine

## 2017-10-28 ENCOUNTER — Ambulatory Visit (INDEPENDENT_AMBULATORY_CARE_PROVIDER_SITE_OTHER): Payer: 59 | Admitting: Family Medicine

## 2017-10-28 ENCOUNTER — Other Ambulatory Visit: Payer: Self-pay

## 2017-10-28 VITALS — BP 128/82 | HR 108 | Temp 99.1°F | Resp 18 | Ht 63.0 in

## 2017-10-28 DIAGNOSIS — N898 Other specified noninflammatory disorders of vagina: Secondary | ICD-10-CM

## 2017-10-28 DIAGNOSIS — R8290 Unspecified abnormal findings in urine: Secondary | ICD-10-CM | POA: Diagnosis not present

## 2017-10-28 LAB — POCT URINALYSIS DIP (MANUAL ENTRY)
BILIRUBIN UA: NEGATIVE mg/dL
Bilirubin, UA: NEGATIVE
Glucose, UA: NEGATIVE mg/dL
Leukocytes, UA: NEGATIVE
Nitrite, UA: NEGATIVE
PROTEIN UA: NEGATIVE mg/dL
SPEC GRAV UA: 1.025 (ref 1.010–1.025)
Urobilinogen, UA: 0.2 E.U./dL
pH, UA: 5.5 (ref 5.0–8.0)

## 2017-10-28 LAB — POC MICROSCOPIC URINALYSIS (UMFC): MUCUS RE: ABSENT

## 2017-10-28 LAB — POCT WET + KOH PREP
TRICH BY WET PREP: ABSENT
YEAST BY WET PREP: ABSENT
Yeast by KOH: ABSENT

## 2017-10-28 MED ORDER — TINIDAZOLE 500 MG PO TABS: 2.0000 g | ORAL_TABLET | Freq: Every day | ORAL | 0 refills | Status: DC

## 2017-10-28 MED ORDER — FLUCONAZOLE 150 MG PO TABS: 150.0000 mg | ORAL_TABLET | Freq: Once | ORAL | 0 refills | Status: AC

## 2017-10-28 NOTE — Progress Notes (Addendum)
Subjective:  10/28/2017 , 8:54 AM .  Patient was seen in Room 1 .   Patient ID: [name], female    DOB: 11-14-1971, 46 y.o.   MRN: [id number] Chief Complaint  Patient presents with  . Vaginal Discharge    x1 month, Pt states she has been having smelly urine and discharge sometimes. Pt states she isn't experiencing any pain and pressure.   HPI [name] is a 46 y.o. female who presents to Primary Care at [facility] complaining of strong GU BV type of odor. Treated for BP with flagyl 500mg  bid x7d 3 mos prior followed by one fluconazole prn. Wet prep at that time showed numerous clue cells. 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    Past Medical History:  Diagnosis Date  . Allergy   . Anemia   . Anxiety   . Clotting disorder ([place])    from beeing anemic  . Depression   . Hypertension   . Iron overload, transfusional   . Migraine   . MS (multiple sclerosis) ([place])   . OSA on CPAP   . Sickle cell anemia (HCC)    trait  . Sickle cell trait ([place])   . Smoker   . Tachycardia    Past Surgical History:  Procedure Laterality Date  . CERVICAL ABLATION    . CESAREAN SECTION     3 times  . CHOLECYSTECTOMY    . TUBAL LIGATION     Prior to Admission medications   Medication Sig Start Date End Date Taking? Authorizing Provider  ALPRAZolam Duanne Moron) 0.5 MG tablet Take 1-1.5 tablets (0.5-0.75 mg total) by mouth 2 (two) times daily as needed for anxiety. 09/08/17  Yes Harrison Mons, PA-C  Cholecalciferol 2000 units CAPS Take 1 capsule (2,000 Units total) by mouth daily. Patient taking differently: Take 4,000 Units by mouth daily.  11/24/16  Yes Weber, Damaris Hippo, PA-C  Ocrelizumab (OCREVUS IV) Inject into the vein.   Yes [provider]  telmisartan-hydrochlorothiazide (MICARDIS HCT) 80-12.5 MG tablet Take 1 tablet by mouth daily. 05/19/17  Yes Weber, Damaris Hippo, PA-C   Allergies  Allergen Reactions  . Iron Shortness Of Breath    IV only   . Tysabri [Natalizumab] Other (See Comments)    Shortness of breath, joint pain, tremors  . Delsym [Dextromethorphan Polistirex Er] Other (See Comments)    nightmares  . Hydrocodone-Guaifenesin Other (See Comments)    nightmares  . Doxycycline Other (See Comments)    Bad taste to patient   Family History  Problem Relation Age of Onset  . Mental illness Mother   . Hyperlipidemia Mother   . Hyperthyroidism Mother   . Mental retardation Mother   . ADD / ADHD Son   . Diabetes Father   . Cancer Father   . Cancer Paternal Aunt        breast  . Arthritis Maternal Grandmother   . Diabetes Maternal Grandmother   . Hearing loss Maternal Grandmother        left hear when she was a child  . Hyperlipidemia Maternal Grandmother   . Hypertension Maternal Grandmother   . Alcohol abuse Maternal Grandfather   . Early death Maternal Grandfather   . Cancer Paternal Aunt        breast  . Multiple sclerosis Cousin    Social History   Socioeconomic  History  . Marital status: Single    Spouse name: N/A  . Number of children: 3  . Years of education: 12+  . Highest education level: None  Social Needs  . Financial resource strain: None  . Food insecurity - worry: None  . Food insecurity - inability: None  . Transportation needs - medical: None  . Transportation needs - non-medical: None  Occupational History  . Occupation: Development worker, community: [employer]  Tobacco Use  . Smoking status: Current Every Day Smoker    Packs/day: 0.25    Years: 25.00    Pack years: 6.25    Types: Cigarettes    Last attempt to quit: 10/16/2014    Years since quitting: 3.0  . Smokeless tobacco: Never Used  . Tobacco comment: 08/15/17 smoke 2-3 cigarettes/day or prn  Substance and Sexual Activity  . Alcohol use: Yes    Alcohol/week: 0.0 oz    Comment: once maybe a month if that  . Drug use: No  . Sexual activity:  Yes    Partners: Male    Birth control/protection: Condom  Other Topics Concern  . None  Social History Narrative   Patient lives at home with her family.   Caffeine Use: 1 cup daily   MA [facility] [place]   Depression screen [facility] 2/9 09/19/2017 03/28/2017 12/23/2016 12/21/2016 11/24/2016  Decreased Interest 0 0 0 0 0  Down, Depressed, Hopeless 0 0 0 0 0  PHQ - 2 Score 0 0 0 0 0  Altered sleeping - - - - -  Tired, decreased energy - - - - -  Change in appetite - - - - -  Feeling bad or failure about yourself  - - - - -  Trouble concentrating - - - - -  Moving slowly or fidgety/restless - - - - -  Suicidal thoughts - - - - -  PHQ-9 Score - - - - -  Difficult doing work/chores - - - - -    Review of Systems See hpi    Objective:   Physical Exam  Constitutional: She is oriented to person, place, and time. She appears well-developed and well-nourished. No distress.  HENT:  Head: Normocephalic and atraumatic.  Eyes: EOM are normal. Pupils are equal, round, and reactive to light.  Neck: Neck supple.  Cardiovascular: Normal rate.  Pulmonary/Chest: Effort normal. No respiratory distress.  Musculoskeletal: Normal range of motion.  Neurological: She is alert and oriented to person, place, and time.  Skin: Skin is warm and dry.  Psychiatric: She has a normal mood and affect. Her behavior is normal.  Nursing note and vitals reviewed.   BP 128/82 (BP Location: Right Arm, Patient Position: Sitting, Cuff Size: Large)   Pulse (!) 108   Temp 99.1 F (37.3 C) (Oral)   Resp 18   Ht 5\' 3"  (1.6 m)   LMP 10/16/2017   SpO2 98%   BMI 51.23 kg/m       Results for orders placed or performed in visit on 10/28/17  Urine Culture  Result Value Ref Range   Urine Culture, Routine Final report    Organism ID, Bacteria Comment   POCT Wet + KOH Prep  Result Value Ref Range   Yeast by KOH Absent Absent   Yeast by wet prep Absent Absent   WBC by wet prep None (A) Few   Clue Cells Wet Prep HPF  POC None None   Trich by wet prep Absent  Absent   Bacteria Wet Prep HPF POC Few Few   Epithelial Cells By [employer] Pref ([facility]) Moderate (A) None, Few, Too numerous to count   RBC,UR,HPF,POC None None RBC/hpf  POCT urinalysis dipstick  Result Value Ref Range   Color, UA yellow yellow   Clarity, UA clear clear   Glucose, UA negative negative mg/dL   Bilirubin, UA negative negative   Ketones, POC UA negative negative mg/dL   Spec Grav, UA 1.025 1.010 - 1.025   Blood, UA trace-intact (A) negative   pH, UA 5.5 5.0 - 8.0   Protein Ur, POC negative negative mg/dL   Urobilinogen, UA 0.2 0.2 or 1.0 E.U./dL   Nitrite, UA Negative Negative   Leukocytes, UA Negative Negative  POCT Microscopic Urinalysis ([facility])  Result Value Ref Range   WBC,UR,HPF,POC None None WBC/hpf   RBC,UR,HPF,POC None None RBC/hpf   Bacteria Few (A) None, Too numerous to count   Mucus Absent Absent   Epithelial Cells, UR Per Microscopy Few (A) None, Too numerous to count cells/hpf    Assessment & Plan:   1. Vaginal discharge   2. Abnormal urinary product   Treat symptomatically as immunosuppressed.  Orders Placed This Encounter  Procedures  . Urine Culture  . GC/Chlamydia Probe Amp  . POCT Wet + KOH Prep  . POCT urinalysis dipstick  . POCT Microscopic Urinalysis ([facility])    Meds ordered this encounter  Medications  . tinidazole (TINDAMAX) 500 MG tablet    Sig: Take 4 tablets (2,000 mg total) by mouth daily with breakfast.    Dispense:  8 tablet    Refill:  0  . fluconazole (DIFLUCAN) 150 MG tablet    Sig: Take 1 tablet (150 mg total) by mouth once for 1 dose. After antibiotic course    Dispense:  1 tablet    Refill:  0     Delman Cheadle, M.D.  Primary Care at [facility] [address] [place], [place] [address] [phone] phone [phone] fax  10/31/17 7:38 AM

## 2017-10-28 NOTE — Patient Instructions (Addendum)
   IF you received an x-ray today, you will receive an invoice from [facility]. Please contact [facility] at [phone] with questions or concerns regarding your invoice.   IF you received labwork today, you will receive an invoice from [facility]. Please contact [facility] at [phone] with questions or concerns regarding your invoice.   Our billing staff will not be able to assist you with questions regarding bills from these companies.  You will be contacted with the lab results as soon as they are available. The fastest way to get your results is to activate your My Chart account. Instructions are located on the last page of this paperwork. If you have not heard from us regarding the results in 2 weeks, please contact this office.     Vaginitis Vaginitis is a condition in which the vaginal tissue swells and becomes red (inflamed). This condition is most often caused by a change in the normal balance of bacteria and yeast that live in the vagina. This change causes an overgrowth of certain bacteria or yeast, which causes the inflammation. There are different types of vaginitis, but the most common types are:  Bacterial vaginosis.  Yeast infection (candidiasis).  Trichomoniasis vaginitis. This is a sexually transmitted disease (STD).  Viral vaginitis.  Atrophic vaginitis.  Allergic vaginitis.  What are the causes? The cause of this condition depends on the type of vaginitis. It can be caused by:  Bacteria (bacterial vaginosis).  Yeast, which is a fungus (yeast infection).  A parasite (trichomoniasis vaginitis).  A virus (viral vaginitis).  Low hormone levels (atrophic vaginitis). Low hormone levels can occur during pregnancy, breastfeeding, or after menopause.  Irritants, such as bubble baths, scented tampons, and feminine sprays (allergic vaginitis).  Other factors can change the normal balance of the yeast and bacteria that live in the vagina.  These include:  Antibiotic medicines.  Poor hygiene.  Diaphragms, vaginal sponges, spermicides, birth control pills, and intrauterine devices (IUD).  Sex.  Infection.  Uncontrolled diabetes.  A weakened defense (immune) system.  What increases the risk? This condition is more likely to develop in women who:  Smoke.  Use vaginal douches, scented tampons, or scented sanitary pads.  Wear tight-fitting pants.  Wear thong underwear.  Use oral birth control pills or an IUD.  Have sex without a condom.  Have multiple sex partners.  Have an STD.  Frequently use the spermicide nonoxynol-9.  Eat lots of foods high in sugar.  Have uncontrolled diabetes.  Have low estrogen levels.  Have a weakened immune system from an immune disorder or medical treatment.  Are pregnant or breastfeeding.  What are the signs or symptoms? Symptoms vary depending on the cause of the vaginitis. Common symptoms include:  Abnormal vaginal discharge. ? The discharge is white, gray, or yellow with bacterial vaginosis. ? The discharge is thick, white, and cheesy with a yeast infection. ? The discharge is frothy and yellow or greenish with trichomoniasis.  A bad vaginal smell. The smell is fishy with bacterial vaginosis.  Vaginal itching, pain, or swelling.  Sex that is painful.  Pain or burning when urinating.  Sometimes there are no symptoms. How is this diagnosed? This condition is diagnosed based on your symptoms and medical history. A physical exam, including a pelvic exam, will also be done. You may also have other tests, including:  Tests to determine the pH level (acidity or alkalinity) of your vagina.  A whiff test, to assess the odor that results when a sample   of your vaginal discharge is mixed with a potassium hydroxide solution.  Tests of vaginal fluid. A sample will be examined under a microscope.  How is this treated? Treatment varies depending on the type of  vaginitis you have. Your treatment may include:  Antibiotic creams or pills to treat bacterial vaginosis and trichomoniasis.  Antifungal medicines, such as vaginal creams or suppositories, to treat a yeast infection.  Medicine to ease discomfort if you have viral vaginitis. Your sexual partner should also be treated.  Estrogen delivered in a cream, pill, suppository, or vaginal ring to treat atrophic vaginitis. If vaginal dryness occurs, lubricants and moisturizing creams may help. You may need to avoid scented soaps, sprays, or douches.  Stopping use of a product that is causing allergic vaginitis. Then using a vaginal cream to treat the symptoms.  Follow these instructions at home: Lifestyle  Keep your genital area clean and dry. Avoid soap, and only rinse the area with water.  Do not douche or use tampons until your health care provider says it is okay to do so. Use sanitary pads, if needed.  Do not have sex until your health care provider approves. When you can return to sex, practice safe sex and use condoms.  Wipe from front to back. This avoids the spread of bacteria from the rectum to the vagina. General instructions  Take over-the-counter and prescription medicines only as told by your health care provider.  If you were prescribed an antibiotic medicine, take or use it as told by your health care provider. Do not stop taking or using the antibiotic even if you start to feel better.  Keep all follow-up visits as told by your health care provider. This is important. How is this prevented?  Use mild, non-scented products. Do not use things that can irritate the vagina, such as fabric softeners. Avoid the following products if they are scented: ? Feminine sprays. ? Detergents. ? Tampons. ? Feminine hygiene products. ? Soaps or bubble baths.  Let air reach your genital area. ? Wear cotton underwear to reduce moisture buildup. ? Avoid wearing underwear while you  sleep. ? Avoid wearing tight pants and underwear or nylons without a cotton panel. ? Avoid wearing thong underwear.  Take off any wet clothing, such as bathing suits, as soon as possible.  Practice safe sex and use condoms. Contact a health care provider if:  You have abdominal pain.  You have a fever.  You have symptoms that last for more than 2-3 days. Get help right away if:  You have a fever and your symptoms suddenly get worse. Summary  Vaginitis is a condition in which the vaginal tissue becomes inflamed.This condition is most often caused by a change in the normal balance of bacteria and yeast that live in the vagina.  Treatment varies depending on the type of vaginitis you have.  Do not douche, use tampons , or have sex until your health care provider approves. When you can return to sex, practice safe sex and use condoms. This information is not intended to replace advice given to you by your health care provider. Make sure you discuss any questions you have with your health care provider. Document Released: 08/28/2007 Document Revised: 12/06/2016 Document Reviewed: 12/06/2016 Elsevier Interactive Patient Education  2018 [employer]vier Inc.  

## 2017-10-29 LAB — URINE CULTURE

## 2017-10-31 LAB — GC/CHLAMYDIA PROBE AMP
Chlamydia trachomatis, NAA: NEGATIVE
NEISSERIA GONORRHOEAE BY PCR: NEGATIVE

## 2017-11-03 ENCOUNTER — Telehealth: Payer: Self-pay | Admitting: Physician Assistant

## 2017-11-03 NOTE — Telephone Encounter (Signed)
Patient states:   ive been up coughing i get this every year i hear it in my chest cough is hurting chest, tell her z-pak. no fever no chills just sweat at night and cough all day for three days now   She would like an antibiotic if she can get one with.  Her call back number is [phone]

## 2017-11-03 NOTE — Telephone Encounter (Signed)
Please see note and advise  

## 2017-11-03 NOTE — Telephone Encounter (Signed)
This patient is on my schedule tomorrow. I will evaluate her at that time.

## 2017-11-04 ENCOUNTER — Ambulatory Visit (INDEPENDENT_AMBULATORY_CARE_PROVIDER_SITE_OTHER): Payer: 59 | Admitting: Physician Assistant

## 2017-11-04 ENCOUNTER — Other Ambulatory Visit: Payer: Self-pay

## 2017-11-04 ENCOUNTER — Encounter: Payer: Self-pay | Admitting: Physician Assistant

## 2017-11-04 VITALS — BP 128/90 | HR 117 | Temp 98.8°F | Resp 18 | Ht 63.0 in

## 2017-11-04 DIAGNOSIS — B9789 Other viral agents as the cause of diseases classified elsewhere: Secondary | ICD-10-CM

## 2017-11-04 DIAGNOSIS — J069 Acute upper respiratory infection, unspecified: Secondary | ICD-10-CM | POA: Diagnosis not present

## 2017-11-04 NOTE — Patient Instructions (Addendum)
Use the tessalon perles that you have, you can take 200 mg up to every 8 hours. Use Mucinex Maximum Strength (without -D) to thin the mucous. Find a vitamin C + Zinc cold product. Get rest and drink lots of water!    IF you received an x-ray today, you will receive an invoice from [facility]. Please contact [facility] at [phone] with questions or concerns regarding your invoice.   IF you received labwork today, you will receive an invoice from [facility]. Please contact [facility] at [phone] with questions or concerns regarding your invoice.   Our billing staff will not be able to assist you with questions regarding bills from these companies.  You will be contacted with the lab results as soon as they are available. The fastest way to get your results is to activate your My Chart account. Instructions are located on the last page of this paperwork. If you have not heard from [place] regarding the results in 2 weeks, please contact this office.

## 2017-11-04 NOTE — Progress Notes (Signed)
Patient ID: [name], female    DOB: 01-09-1971, 46 y.o.   MRN: [id number]  PCP: Mancel Bale, PA-C  Chief Complaint  Patient presents with  . Cough    x3 days, pt states can be productive and has been having coughing fits. Pt states she has been taking OTC Cold and Flu    Subjective:   Presents for evaluation of cough x 3 days.   She is immunocompromised with treatment for MS, which is stable. Exposed to a sick contact at work. Initially, the cough was productive and occurred in fits, keeping her awake. She has improved considerably with OTC medications and rest. She had contacted the office yesterday requesting an antibiotic, but since then has improved dramatically. Decided to keep the appointment today just in case.  No ST, ear pain, laryngitis, headache. No body aches, fever, chills. No CP, SOB. No GU/GI symptoms.    Review of Systems As above.    Patient Active Problem List   Diagnosis Date Noted  . Other fatigue 08/05/2016  . Migraine without aura and without status migrainosus, not intractable 08/05/2016  . Prediabetes   . MS (multiple sclerosis) ([place])   . Obesity, Class III, BMI 40-49.9 (morbid obesity) ([place]) 10/16/2014  . Smoker 10/16/2014  . OSA on CPAP 10/16/2014  . HTN (hypertension) 01/14/2012  . Sickle cell trait ([place]) 01/14/2012     Prior to Admission medications   Medication Sig Start Date End Date Taking? Authorizing Provider  ALPRAZolam Duanne Moron) 0.5 MG tablet Take 1-1.5 tablets (0.5-0.75 mg total) by mouth 2 (two) times daily as needed for anxiety. 09/08/17  Yes Harrison Mons, PA-C  Cholecalciferol 2000 units CAPS Take 1 capsule (2,000 Units total) by mouth daily. Patient taking differently: Take 4,000 Units by mouth daily.  11/24/16  Yes Weber, Damaris Hippo, PA-C  Ocrelizumab (OCREVUS IV) Inject into the vein.   Yes [provider]  telmisartan-hydrochlorothiazide (MICARDIS HCT) 80-12.5 MG tablet Take 1 tablet by mouth daily.  05/19/17  Yes Weber, Damaris Hippo, PA-C  tinidazole (TINDAMAX) 500 MG tablet Take 4 tablets (2,000 mg total) by mouth daily with breakfast. Patient not taking: Reported on 11/04/2017 10/28/17   Shawnee Knapp, MD     Allergies  Allergen Reactions  . Iron Shortness Of Breath    IV only   . Tysabri [Natalizumab] Other (See Comments)    Shortness of breath, joint pain, tremors  . Delsym [Dextromethorphan Polistirex Er] Other (See Comments)    nightmares  . Hydrocodone-Guaifenesin Other (See Comments)    nightmares  . Doxycycline Other (See Comments)    Bad taste to patient       Objective:  Physical Exam  Constitutional: She is oriented to person, place, and time. She appears well-developed and well-nourished. No distress.  BP 128/90 (BP Location: Right Arm, Patient Position: Sitting, Cuff Size: Large)   Pulse (!) 117   Temp 98.8 F (37.1 C) (Oral)   Resp 18   Ht 5\' 3"  (1.6 m)   LMP 10/16/2017   SpO2 99%   BMI 51.23 kg/m    HENT:  Head: Normocephalic and atraumatic.  Right Ear: Hearing, tympanic membrane, external ear and ear canal normal.  Left Ear: Hearing, tympanic membrane, external ear and ear canal normal.  Nose: Mucosal edema and rhinorrhea present.  No foreign bodies. Right sinus exhibits no maxillary sinus tenderness and no frontal sinus tenderness. Left sinus exhibits no maxillary sinus tenderness and no frontal sinus tenderness.  Mouth/Throat: Uvula is midline, oropharynx is clear and moist and mucous membranes are normal. No uvula swelling. No oropharyngeal exudate.  Eyes: Conjunctivae and EOM are normal. Pupils are equal, round, and reactive to light. Right eye exhibits no discharge. Left eye exhibits no discharge. No scleral icterus.  Neck: Trachea normal, normal range of motion and full passive range of motion without pain. Neck supple. No thyroid mass and no thyromegaly present.  Cardiovascular: Normal rate, regular rhythm and normal heart sounds.  Pulmonary/Chest:  Effort normal and breath sounds normal.  Lymphadenopathy:       Head (right side): No submandibular, no tonsillar, no preauricular, no posterior auricular and no occipital adenopathy present.       Head (left side): No submandibular, no tonsillar, no preauricular and no occipital adenopathy present.    She has no cervical adenopathy.       Right: No supraclavicular adenopathy present.       Left: No supraclavicular adenopathy present.  Neurological: She is alert and oriented to person, place, and time. She has normal strength. No cranial nerve deficit or sensory deficit.  Skin: Skin is warm, dry and intact. No rash noted.  Psychiatric: She has a normal mood and affect. Her speech is normal and behavior is normal.       Assessment & Plan:   1. Viral URI with cough Supportive care.  Anticipatory guidance.  RTC if symptoms worsen/persist.   Return if symptoms worsen or fail to improve.   Fara Chute, PA-C [facility] [place]

## 2017-11-30 ENCOUNTER — Other Ambulatory Visit: Payer: Self-pay | Admitting: Physician Assistant

## 2017-11-30 DIAGNOSIS — E559 Vitamin D deficiency, unspecified: Secondary | ICD-10-CM

## 2017-11-30 NOTE — Telephone Encounter (Signed)
Medication refill request.

## 2017-12-01 ENCOUNTER — Ambulatory Visit: Payer: 59 | Admitting: Physician Assistant

## 2017-12-18 ENCOUNTER — Encounter: Payer: Self-pay | Admitting: Diagnostic Neuroimaging

## 2017-12-27 ENCOUNTER — Encounter: Payer: Self-pay | Admitting: Physician Assistant

## 2017-12-27 ENCOUNTER — Ambulatory Visit (INDEPENDENT_AMBULATORY_CARE_PROVIDER_SITE_OTHER): Payer: 59 | Admitting: Physician Assistant

## 2017-12-27 VITALS — BP 140/90 | HR 113 | Temp 99.0°F | Resp 18 | Ht 63.0 in | Wt 291.0 lb

## 2017-12-27 DIAGNOSIS — E559 Vitamin D deficiency, unspecified: Secondary | ICD-10-CM | POA: Diagnosis not present

## 2017-12-27 DIAGNOSIS — M79609 Pain in unspecified limb: Secondary | ICD-10-CM | POA: Diagnosis not present

## 2017-12-27 DIAGNOSIS — N898 Other specified noninflammatory disorders of vagina: Secondary | ICD-10-CM | POA: Diagnosis not present

## 2017-12-27 DIAGNOSIS — R202 Paresthesia of skin: Secondary | ICD-10-CM | POA: Diagnosis not present

## 2017-12-27 LAB — POCT WET + KOH PREP
Trich by wet prep: ABSENT
YEAST BY KOH: ABSENT
YEAST BY WET PREP: ABSENT

## 2017-12-27 NOTE — Progress Notes (Deleted)
   Subjective:    Patient ID: [name], female    DOB: 1971/05/17, 47 y.o.   MRN: [id number]  HPI    Review of Systems     Objective:   Physical Exam        Assessment & Plan:

## 2017-12-27 NOTE — Patient Instructions (Signed)
     IF you received an x-ray today, you will receive an invoice from [facility]. Please contact  Radiology at [phone] with questions or concerns regarding your invoice.   IF you received labwork today, you will receive an invoice from [facility]. Please contact [facility] at [phone] with questions or concerns regarding your invoice.   Our billing staff will not be able to assist you with questions regarding bills from these companies.  You will be contacted with the lab results as soon as they are available. The fastest way to get your results is to activate your My Chart account. Instructions are located on the last page of this paperwork. If you have not heard from us regarding the results in 2 weeks, please contact this office.     

## 2017-12-27 NOTE — Progress Notes (Signed)
Patient ID: [name], female    DOB: January 11, 1971, 47 y.o.   MRN: [id number]  PCP: Harrison Mons, PA-C  Chief Complaint  Patient presents with  . Numbness    Right arm    Subjective:   Presents for evaluation of numbness in the right arm.  Patient was diagnosed with MS in 06/2016.  Felt a little off yesterday when she went to work. Head felt cloudy. Was a little slower than ususal. Dizziness developed about 8 pm, and felt unsteady. No spinning. Awoke at 1 am with RIGHT arm numbness from the elbow to the fingers. Felt heavy. Took a couple of hours to resolve. RIGHT hand dominant. Did a [employer], television/film[employer]video and is worried.  Notes that RIGHT hand is numb thumb, index and middle fingers, in the mornings. Every day. Holds her hand down low, with gradual resolution. First thing in the morning she has difficulty holding a pencil and writing at work. Has not taken anything to try to treat this.    Review of Systems As above. In addition, she reports vaginal odor.  No itching or atypical discharge.    Patient Active Problem List   Diagnosis Date Noted  . Other fatigue 08/05/2016  . Migraine without aura and without status migrainosus, not intractable 08/05/2016  . Prediabetes   . MS (multiple sclerosis) ([place])   . Obesity, Class III, BMI 40-49.9 (morbid obesity) ([place]) 10/16/2014  . Smoker 10/16/2014  . OSA on CPAP 10/16/2014  . HTN (hypertension) 01/14/2012  . Sickle cell trait ([place]) 01/14/2012     Prior to Admission medications   Medication Sig Start Date End Date Taking? Authorizing Provider  ALPRAZolam Duanne Moron) 0.5 MG tablet Take 1-1.5 tablets (0.5-0.75 mg total) by mouth 2 (two) times daily as needed for anxiety. 09/08/17  Yes Klee Kolek, PA-C  Ocrelizumab (OCREVUS IV) Inject into the vein.   Yes [provider]  telmisartan-hydrochlorothiazide (MICARDIS HCT) 80-12.5 MG tablet Take 1 tablet by mouth daily. 05/19/17  Yes Weber, Damaris Hippo, PA-C  tinidazole  (TINDAMAX) 500 MG tablet Take 4 tablets (2,000 mg total) by mouth daily with breakfast. 10/28/17  Yes Shawnee Knapp, MD  Cholecalciferol (VITAMIN D3) 2000 units capsule TAKE 1 CAPSULE (2,000 UNITS TOTAL) BY MOUTH DAILY. Patient not taking: Reported on 12/27/2017 12/05/17   Mancel Bale, PA-C     Allergies  Allergen Reactions  . Iron Shortness Of Breath    IV only   . Tysabri [Natalizumab] Other (See Comments)    Shortness of breath, joint pain, tremors  . Delsym [Dextromethorphan Polistirex Er] Other (See Comments)    nightmares  . Hydrocodone-Guaifenesin Other (See Comments)    nightmares  . Doxycycline Other (See Comments)    Bad taste to patient       Objective:  Physical Exam  Constitutional: She is oriented to person, place, and time. She appears well-developed and well-nourished. She is active and cooperative. No distress.  BP 140/90   Pulse (!) 113   Temp 99 F (37.2 C) (Oral)   Resp 18   Ht 5\' 3"  (1.6 m)   Wt 291 lb (132 kg)   SpO2 99%   BMI 51.55 kg/m   HENT:  Head: Normocephalic and atraumatic.  Right Ear: Hearing normal.  Left Ear: Hearing normal.  Eyes: Conjunctivae are normal. No scleral icterus.  Neck: Normal range of motion. Neck supple. No thyromegaly present.  Cardiovascular: Regular rhythm and normal heart sounds.  No extrasystoles are present.  Tachycardia present.  Pulses:      Radial pulses are 2+ on the right side, and 2+ on the left side.  Pulmonary/Chest: Effort normal and breath sounds normal.  Lymphadenopathy:       Head (right side): No tonsillar, no preauricular, no posterior auricular and no occipital adenopathy present.       Head (left side): No tonsillar, no preauricular, no posterior auricular and no occipital adenopathy present.    She has no cervical adenopathy.       Right: No supraclavicular adenopathy present.       Left: No supraclavicular adenopathy present.  Neurological: She is alert and oriented to person, place, and time. No  sensory deficit.  Reflex Scores:      Bicep reflexes are 2+ on the right side and 2+ on the left side.      Patellar reflexes are 1+ on the right side and 1+ on the left side.      Achilles reflexes are 1+ on the right side and 1+ on the left side. Mild decrease in grip strength of the RIGHT hand compared to the LEFT. Other upper arm strength testing is symmetric.  Phalen's and Tinel's did not reproduce symptoms during the tests, but after testing, symptoms occurred.  Skin: Skin is warm, dry and intact. No rash noted. No cyanosis or erythema. Nails show no clubbing.  Psychiatric: She has a normal mood and affect. Her speech is normal and behavior is normal.    Results for orders placed or performed in visit on 12/27/17  POCT Wet + KOH Prep  Result Value Ref Range   Yeast by KOH Absent Absent   Yeast by wet prep Absent Absent   WBC by wet prep None (A) Few   Clue Cells Wet Prep HPF POC None None   Trich by wet prep Absent Absent   Bacteria Wet Prep HPF POC Few Few   Epithelial Cells By [employer] Pref ([facility]) Few None, Few, Too numerous to count   RBC,UR,HPF,POC None None RBC/hpf       Assessment & Plan:   1. Paresthesia and pain of right extremity Unclear etiology.  Possibly due to MS but more likely carpal tunnel syndrome.  Trial of a wrist splint.  Update labs.  If persists she will discuss with her neurologist. - CBC with Differential/Platelet - Comprehensive metabolic panel - TSH - Splint wrist  2. Vaginal odor No evidence of infection.  Good hydration.  Breathable clothing. - POCT Wet + KOH Prep  3. Vitamin D deficiency Continue supplementation. - Cholecalciferol (VITAMIN D3) 2000 units capsule; TAKE 1 CAPSULE (2,000 UNITS TOTAL) BY MOUTH DAILY.  Dispense: 90 capsule; Refill: 3    Return if symptoms worsen or fail to improve.   Fara Chute, PA-C [facility] [place] She reports vaginal odor.

## 2017-12-28 LAB — CBC WITH DIFFERENTIAL/PLATELET
BASOS: 0 %
Basophils Absolute: 0 10*3/uL (ref 0.0–0.2)
EOS (ABSOLUTE): 0.3 10*3/uL (ref 0.0–0.4)
Eos: 3 %
Hematocrit: 37.9 % (ref 34.0–46.6)
Hemoglobin: 11.1 g/dL (ref 11.1–15.9)
Immature Grans (Abs): 0 10*3/uL (ref 0.0–0.1)
Immature Granulocytes: 0 %
Lymphocytes Absolute: 2.6 10*3/uL (ref 0.7–3.1)
Lymphs: 28 %
MCH: 20.7 pg — AB (ref 26.6–33.0)
MCHC: 29.3 g/dL — ABNORMAL LOW (ref 31.5–35.7)
MCV: 71 fL — AB (ref 79–97)
MONOS ABS: 0.4 10*3/uL (ref 0.1–0.9)
Monocytes: 5 %
NEUTROS ABS: 6 10*3/uL (ref 1.4–7.0)
NEUTROS PCT: 64 %
PLATELETS: 354 10*3/uL (ref 150–379)
RBC: 5.37 x10E6/uL — ABNORMAL HIGH (ref 3.77–5.28)
RDW: 19.3 % — AB (ref 12.3–15.4)
WBC: 9.3 10*3/uL (ref 3.4–10.8)

## 2017-12-28 LAB — COMPREHENSIVE METABOLIC PANEL
A/G RATIO: 1.8 (ref 1.2–2.2)
ALK PHOS: 91 IU/L (ref 39–117)
ALT: 16 IU/L (ref 0–32)
AST: 15 IU/L (ref 0–40)
Albumin: 4.6 g/dL (ref 3.5–5.5)
BILIRUBIN TOTAL: 0.2 mg/dL (ref 0.0–1.2)
BUN/Creatinine Ratio: 7 — ABNORMAL LOW (ref 9–23)
BUN: 6 mg/dL (ref 6–24)
CO2: 21 mmol/L (ref 20–29)
Calcium: 11.2 mg/dL — ABNORMAL HIGH (ref 8.7–10.2)
Chloride: 104 mmol/L (ref 96–106)
Creatinine, Ser: 0.83 mg/dL (ref 0.57–1.00)
GFR calc Af Amer: 96 mL/min/{1.73_m2} (ref >59)
GFR calc non Af Amer: 84 mL/min/{1.73_m2} (ref >59)
GLUCOSE: 84 mg/dL (ref 65–99)
Globulin, Total: 2.6 g/dL (ref 1.5–4.5)
POTASSIUM: 4.4 mmol/L (ref 3.5–5.2)
Sodium: 140 mmol/L (ref 134–144)
Total Protein: 7.2 g/dL (ref 6.0–8.5)

## 2017-12-28 LAB — TSH: TSH: 1.4 u[IU]/mL (ref 0.450–4.500)

## 2018-01-02 ENCOUNTER — Encounter: Payer: Self-pay | Admitting: Physician Assistant

## 2018-01-15 ENCOUNTER — Telehealth: Payer: Self-pay | Admitting: Physician Assistant

## 2018-01-15 NOTE — Telephone Encounter (Signed)
Patient needs her FMLA forms updated for her MS. 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  I will place the forms in Chelle's box on 01/15/18 to be signed please return to the FMLA/Disability desk within 5-7 business days. Thank you!

## 2018-01-16 NOTE — Telephone Encounter (Signed)
Paperwork scanned and faxed to [employer] on 01/16/18

## 2018-01-16 NOTE — Telephone Encounter (Signed)
Forms completed and returned to FMLA/disability desk

## 2018-01-30 ENCOUNTER — Ambulatory Visit: Payer: 59 | Admitting: Diagnostic Neuroimaging

## 2018-02-10 ENCOUNTER — Ambulatory Visit (INDEPENDENT_AMBULATORY_CARE_PROVIDER_SITE_OTHER): Payer: 59 | Admitting: Physician Assistant

## 2018-02-10 ENCOUNTER — Ambulatory Visit: Payer: 59 | Admitting: Physician Assistant

## 2018-02-10 ENCOUNTER — Encounter: Payer: Self-pay | Admitting: Physician Assistant

## 2018-02-10 VITALS — BP 132/84 | HR 98 | Temp 98.4°F

## 2018-02-10 DIAGNOSIS — R5381 Other malaise: Secondary | ICD-10-CM

## 2018-02-10 DIAGNOSIS — G35 Multiple sclerosis: Secondary | ICD-10-CM | POA: Diagnosis not present

## 2018-02-10 DIAGNOSIS — G43009 Migraine without aura, not intractable, without status migrainosus: Secondary | ICD-10-CM

## 2018-02-10 NOTE — Progress Notes (Signed)
Patient ID: [name], female    DOB: August 05, 1971, 47 y.o.   MRN: [id number]  PCP: Harrison Mons, PA-C  Chief Complaint  Patient presents with  . other    fill out jury duty forms    Subjective:   Presents for evaluation of MS, migraine headache, back pain.  She has received a jury summons for 4/16.  Due to MS and migraine headache, she experiences debility that make it difficult for her to serve in the capacity of a juror. Her mentation is not affected, but her physical state makes sitting in the jury box for extended periods onerous.  Carpal tunnel symptoms are improved. Wrist splint works wearing it at night. Minimal symptoms. Has a new keyboard and computer mouse at work.  Back pain with prolonged sitting. Has to get up and move/stretch frequently. Did not tolerate PT previously, pain and weakness worsened after session. She tries to perform HEP, but finds that her exercise stamina is declining, making it hard to perform her ADLs and play with her granddaughter, go dancing (one of her favorite activities). Aquatic exercise is not an option due to her fear of pools/oceans.   Review of Systems As above.    Patient Active Problem List   Diagnosis Date Noted  . Other fatigue 08/05/2016  . Migraine without aura and without status migrainosus, not intractable 08/05/2016  . Prediabetes   . MS (multiple sclerosis) ([place])   . Obesity, Class III, BMI 40-49.9 (morbid obesity) ([place]) 10/16/2014  . Smoker 10/16/2014  . OSA on CPAP 10/16/2014  . HTN (hypertension) 01/14/2012  . Sickle cell trait ([place]) 01/14/2012     Prior to Admission medications   Medication Sig Start Date End Date Taking? Authorizing Provider  ALPRAZolam Duanne Moron) 0.5 MG tablet Take 1-1.5 tablets (0.5-0.75 mg total) by mouth 2 (two) times daily as needed for anxiety. 09/08/17  Yes Laylana Gerwig, PA-C  Cholecalciferol (VITAMIN D3) 2000 units capsule TAKE 1 CAPSULE (2,000 UNITS TOTAL) BY MOUTH  DAILY. 12/05/17  Yes Weber, Sarah L, PA-C  Ocrelizumab (OCREVUS IV) Inject into the vein.   Yes [provider]  telmisartan-hydrochlorothiazide (MICARDIS HCT) 80-12.5 MG tablet Take 1 tablet by mouth daily. 05/19/17  Yes Weber, Damaris Hippo, PA-C  tinidazole (TINDAMAX) 500 MG tablet Take 4 tablets (2,000 mg total) by mouth daily with breakfast. 10/28/17   Shawnee Knapp, MD     Allergies  Allergen Reactions  . Iron Shortness Of Breath    IV only   . Tysabri [Natalizumab] Other (See Comments)    Shortness of breath, joint pain, tremors  . Delsym [Dextromethorphan Polistirex Er] Other (See Comments)    nightmares  . Hydrocodone-Guaifenesin Other (See Comments)    nightmares  . Doxycycline Other (See Comments)    Bad taste to patient       Objective:  Physical Exam  Constitutional: She is oriented to person, place, and time. She appears well-developed and well-nourished. She is active and cooperative. No distress.  BP 132/84 (BP Location: Right Arm, Patient Position: Sitting, Cuff Size: Large)   Pulse 98   Temp 98.4 F (36.9 C) (Oral)   LMP 02/07/2018 (Exact Date)   SpO2 99%    Eyes: Conjunctivae are normal.  Pulmonary/Chest: Effort normal.  Neurological: She is alert and oriented to person, place, and time.  Psychiatric: She has a normal mood and affect. Her speech is normal and behavior is normal.   Wt Readings from Last 3 Encounters:  12/27/17  291 lb (132 kg)  09/19/17 289 lb 3.2 oz (131.2 kg)  08/15/17 287 lb (130.2 kg)      Assessment & Plan:   Problem List Items Addressed This Visit    MS (multiple sclerosis) ([place]) - Primary    Letter requesting excuse from jury duty. Continue per neurology. Recommend retry PT, gently and slowly.      Relevant Orders   Ambulatory referral to Physical Therapy   Migraine without aura and without status migrainosus, not intractable    Letter requesting excuse from jury duty.       Other Visit Diagnoses    Debility        Retry PT. Needs program that gently and slowly progresses, focused on core strengthening, body mechanics and exercise tolerance with minimal aggravation of MS   Relevant Orders   Ambulatory referral to Physical Therapy       No follow-ups on file.   Fara Chute, PA-C [facility]

## 2018-02-10 NOTE — Assessment & Plan Note (Signed)
Letter requesting excuse from jury duty. Continue per neurology. Recommend retry PT, gently and slowly.

## 2018-02-10 NOTE — Assessment & Plan Note (Signed)
Letter requesting excuse from jury duty.

## 2018-02-10 NOTE — Patient Instructions (Signed)
     IF you received an x-ray today, you will receive an invoice from [facility]. Please contact [facility] at [phone] with questions or concerns regarding your invoice.   IF you received labwork today, you will receive an invoice from [facility]. Please contact [facility] at [phone] with questions or concerns regarding your invoice.   Our billing staff will not be able to assist you with questions regarding bills from these companies.  You will be contacted with the lab results as soon as they are available. The fastest way to get your results is to activate your My Chart account. Instructions are located on the last page of this paperwork. If you have not heard from us regarding the results in 2 weeks, please contact this office.     

## 2018-02-15 ENCOUNTER — Encounter: Payer: Self-pay | Admitting: Physician Assistant

## 2018-02-21 ENCOUNTER — Encounter: Payer: Self-pay | Admitting: Physician Assistant

## 2018-03-13 ENCOUNTER — Ambulatory Visit: Payer: 59 | Admitting: Diagnostic Neuroimaging

## 2018-03-13 DIAGNOSIS — R260 Ataxic gait: Secondary | ICD-10-CM | POA: Diagnosis not present

## 2018-03-17 ENCOUNTER — Other Ambulatory Visit: Payer: Self-pay

## 2018-03-17 ENCOUNTER — Encounter: Payer: 59 | Admitting: Physician Assistant

## 2018-03-17 ENCOUNTER — Ambulatory Visit (INDEPENDENT_AMBULATORY_CARE_PROVIDER_SITE_OTHER): Payer: 59 | Admitting: Physician Assistant

## 2018-03-17 ENCOUNTER — Encounter: Payer: Self-pay | Admitting: Physician Assistant

## 2018-03-17 VITALS — BP 132/78 | HR 100 | Temp 98.3°F | Ht 63.0 in | Wt 290.0 lb

## 2018-03-17 DIAGNOSIS — Z0000 Encounter for general adult medical examination without abnormal findings: Secondary | ICD-10-CM

## 2018-03-17 DIAGNOSIS — I10 Essential (primary) hypertension: Secondary | ICD-10-CM

## 2018-03-17 DIAGNOSIS — Z1329 Encounter for screening for other suspected endocrine disorder: Secondary | ICD-10-CM

## 2018-03-17 DIAGNOSIS — R7303 Prediabetes: Secondary | ICD-10-CM | POA: Diagnosis not present

## 2018-03-17 DIAGNOSIS — Z23 Encounter for immunization: Secondary | ICD-10-CM | POA: Diagnosis not present

## 2018-03-17 DIAGNOSIS — E66813 Obesity, class 3: Secondary | ICD-10-CM

## 2018-03-17 DIAGNOSIS — Z124 Encounter for screening for malignant neoplasm of cervix: Secondary | ICD-10-CM | POA: Diagnosis not present

## 2018-03-17 DIAGNOSIS — Z113 Encounter for screening for infections with a predominantly sexual mode of transmission: Secondary | ICD-10-CM | POA: Diagnosis not present

## 2018-03-17 DIAGNOSIS — Z13228 Encounter for screening for other metabolic disorders: Secondary | ICD-10-CM | POA: Diagnosis not present

## 2018-03-17 NOTE — Progress Notes (Signed)
Patient ID: [name], female    DOB: 1971/02/27, 47 y.o.   MRN: [id number]  PCP: Harrison Mons, PA-C  Chief Complaint  Patient presents with  . Annual Exam    Subjective:   Presents for [employer].  And she has multiple sclerosis, followed by neurology. Has started PT, and does 6 six-minute sets of biking twice a week.  She is working to Financial planner and stamina.  Cervical Cancer Screening: Today. Breast Cancer Screening: Last screening was 12/17/16, normal according to pt. Colorectal Cancer Screening: in 2 years Bone Density Testing: Not yet a candidate HIV Screening: negative 11/2013, desires repeat today STI Screening: Today Seasonal Influenza Vaccination: 08/14/2017 Td/Tdap Vaccination: Unsure, completed today. Pneumococcal Vaccination: Not yet a candidate Zoster Vaccination: Not yet a candidate Frequency of Dental evaluation: Appointment this month Frequency of Eye evaluation: Neurologist checks her eyes, does not want to go to an eye doctor    Review of Systems  Constitutional: Negative.   HENT: Negative.   Eyes: Negative.   Respiratory: Negative.   Cardiovascular: Negative.   Gastrointestinal: Negative.   Endocrine: Negative.   Genitourinary: Negative.   Musculoskeletal: Negative.   Skin: Negative.   Allergic/Immunologic: Positive for immunocompromised state. Negative for environmental allergies and food allergies.  Neurological: Negative.   Hematological: Negative.   Psychiatric/Behavioral: Negative.     Patient Active Problem List   Diagnosis Date Noted  . Other fatigue 08/05/2016  . Migraine without aura and without status migrainosus, not intractable 08/05/2016  . Prediabetes   . MS (multiple sclerosis) ([place])   . Obesity, Class III, BMI 40-49.9 (morbid obesity) ([place]) 10/16/2014  . Smoker 10/16/2014  . OSA on CPAP 10/16/2014  . HTN (hypertension) 01/14/2012  . Sickle cell trait ([place]) 01/14/2012    Past Medical  History:  Diagnosis Date  . Allergy   . Anemia   . Anxiety   . Clotting disorder ([place])    from beeing anemic  . Depression   . Hypertension   . Iron overload, transfusional   . Migraine   . MS (multiple sclerosis) ([place])   . OSA on CPAP   . Sickle cell anemia ([facility])    trait  . Sickle cell trait ([place])   . Smoker   . Tachycardia      Prior to Admission medications   Medication Sig Start Date End Date Taking? Authorizing Provider  ALPRAZolam Duanne Moron) 0.5 MG tablet Take 1-1.5 tablets (0.5-0.75 mg total) by mouth 2 (two) times daily as needed for anxiety. 09/08/17  Yes Khyle Goodell, PA-C  Cholecalciferol (VITAMIN D3) 2000 units capsule TAKE 1 CAPSULE (2,000 UNITS TOTAL) BY MOUTH DAILY. 12/05/17  Yes Weber, Sarah L, PA-C  Ocrelizumab (OCREVUS IV) Inject into the vein.   Yes [provider]  telmisartan-hydrochlorothiazide (MICARDIS HCT) 80-12.5 MG tablet Take 1 tablet by mouth daily. 05/19/17  Yes Weber, Damaris Hippo, PA-C    Allergies  Allergen Reactions  . Iron Shortness Of Breath    IV only   . Tysabri [Natalizumab] Other (See Comments)    Shortness of breath, joint pain, tremors  . Delsym [Dextromethorphan Polistirex Er] Other (See Comments)    nightmares    Past Surgical History:  Procedure Laterality Date  . CERVICAL ABLATION    . CESAREAN SECTION     3 times  . CHOLECYSTECTOMY    . TUBAL LIGATION      Family History  Problem Relation Age of Onset  . Mental illness Mother   .  Hyperlipidemia Mother   . Hyperthyroidism Mother   . Mental retardation Mother   . ADD / ADHD Son   . Diabetes Father   . Cancer Father   . Cancer Paternal Aunt        breast  . Arthritis Maternal Grandmother   . Diabetes Maternal Grandmother   . Hearing loss Maternal Grandmother        left hear when she was a child  . Hyperlipidemia Maternal Grandmother   . Hypertension Maternal Grandmother   . Alcohol abuse Maternal Grandfather   . Early death Maternal Grandfather   . Cancer  Paternal Aunt        breast  . Multiple sclerosis Cousin     Social History   Socioeconomic History  . Marital status: Single    Spouse name: N/A  . Number of children: 3  . Years of education: 12+  . Highest education level: Not on file  Occupational History  . 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, answers calls    Employer: [employer]  . Financial resource strain: Hard  . Food insecurity:    Worry: Sometimes true    Inability: Never true  . Transportation needs:    Medical: No    Non-medical: No  Tobacco Use  . Smoking status: Current Every Day Smoker    Packs/day: 0.50    Years: 25.00    Pack years: 12.50    Types: Cigarettes    Last attempt to quit: 10/16/2014    Years since quitting: 3.4  . Smokeless tobacco: Never Used  . Tobacco comment: 08/15/17 smoke 2-3 cigarettes/day or prn  Substance and Sexual Activity  . Alcohol use: Yes    Alcohol/week: 0.0 oz    Comment: once maybe a month if that  . Drug use: No  . Sexual activity: Yes    Partners: Male    Birth control/protection: Condom  Lifestyle  . Physical activity:    Days per week: 0 days    Minutes per session: 0 min  . Stress: Rather much  Relationships  . Social connections:    Talks on phone: More than three times a week    Gets together: More than three times a week    Attends religious service: More than 4 times per year    Active member of club or organization: No    Attends meetings of clubs or organizations: Never    Relationship status: Divorced  Other Topics Concern  . Not on file  Social History Narrative   Patient lives at home with her family.   Working at the call center for [employer].         Objective:  Physical Exam  Constitutional: She is oriented to person, place, and time. Vital signs are normal. She appears well-developed and well-nourished. She is active and cooperative. No distress.  BP 132/78 (BP Location: Left  Arm, Patient Position: Sitting, Cuff Size: Large)   Pulse 100   Temp 98.3 F (36.8 C) (Oral)   Ht '5\' 3"'$  (1.6 m)   Wt 290 lb (131.5 kg)   LMP 03/10/2018   SpO2 99%   BMI 51.37 kg/m    HENT:  Head: Normocephalic and atraumatic.  Right Ear: Hearing, tympanic membrane, external ear and ear canal normal. No foreign bodies.  Left Ear: Hearing, tympanic membrane, external ear and ear canal normal. No foreign bodies.  Nose: Nose normal.  Mouth/Throat: Uvula is midline,  oropharynx is clear and moist and mucous membranes are normal. No oral lesions. Normal dentition. No dental abscesses or uvula swelling. No oropharyngeal exudate.  Eyes: Pupils are equal, round, and reactive to light. Conjunctivae, EOM and lids are normal. Right eye exhibits no discharge. Left eye exhibits no discharge. No scleral icterus.  Fundoscopic exam:      The right eye shows no arteriolar narrowing, no AV nicking, no exudate, no hemorrhage and no papilledema.       The left eye shows no arteriolar narrowing, no AV nicking, no exudate, no hemorrhage and no papilledema.  Neck: Trachea normal, normal range of motion and full passive range of motion without pain. Neck supple. No spinous process tenderness and no muscular tenderness present. No thyroid mass and no thyromegaly present.  Cardiovascular: Normal rate, regular rhythm, normal heart sounds, intact distal pulses and normal pulses.  Pulmonary/Chest: Effort normal and breath sounds normal. She exhibits no tenderness and no retraction. Right breast exhibits no inverted nipple, no mass, no nipple discharge, no skin change and no tenderness. Left breast exhibits no inverted nipple, no mass, no nipple discharge, no skin change and no tenderness. No breast tenderness, discharge or bleeding. Breasts are symmetrical.  Abdominal: Soft. Normal appearance and bowel sounds are normal. She exhibits no distension and no mass. There is no hepatosplenomegaly. There is no tenderness. There  is no rigidity, no rebound, no guarding, no CVA tenderness, no tenderness at McBurney's point and negative Murphy's sign. No hernia. Hernia confirmed negative in the right inguinal area and confirmed negative in the left inguinal area.  Genitourinary: Rectum normal, vagina normal and uterus normal. Rectal exam shows no external hemorrhoid and no fissure. No breast tenderness, discharge or bleeding. Pelvic exam was performed with patient supine. No labial fusion. There is no rash, tenderness, lesion or injury on the right labia. There is no rash, tenderness, lesion or injury on the left labia. Cervix exhibits no motion tenderness, no discharge and no friability. Right adnexum displays no mass, no tenderness and no fullness. Left adnexum displays no mass, no tenderness and no fullness. No erythema, tenderness or bleeding in the vagina. No foreign body in the vagina. No signs of injury around the vagina. No vaginal discharge found.  Musculoskeletal: She exhibits no edema or tenderness.       Cervical back: Normal.       Thoracic back: Normal.       Lumbar back: Normal.  Lymphadenopathy:       Head (right side): No tonsillar, no preauricular, no posterior auricular and no occipital adenopathy present.       Head (left side): No tonsillar, no preauricular, no posterior auricular and no occipital adenopathy present.    She has no cervical adenopathy.    She has no axillary adenopathy.       Right: No inguinal and no supraclavicular adenopathy present.       Left: No inguinal and no supraclavicular adenopathy present.  Neurological: She is alert and oriented to person, place, and time. She has normal strength and normal reflexes. No cranial nerve deficit. She exhibits normal muscle tone. Coordination and gait normal.  Skin: Skin is warm, dry and intact. No rash noted. She is not diaphoretic. No cyanosis or erythema. Nails show no clubbing.  Psychiatric: She has a normal mood and affect. Her speech is normal  and behavior is normal. Judgment and thought content normal.     Wt Readings from Last 3 Encounters:  03/17/18 290 lb (  131.5 kg)  12/27/17 291 lb (132 kg)  09/19/17 289 lb 3.2 oz (131.2 kg)       Assessment & Plan:   Problem List Items Addressed This Visit    HTN (hypertension)    Controlled.  Continue Micardis HCTZ-12.5 mg daily.      Relevant Orders   CBC with Differential/Platelet (Completed)   Obesity, Class III, BMI 40-49.9 (morbid obesity) ([place])    Continue working on lifestyle changes.  Hope physical therapy will help to improve her exercise tolerance and allow more active lifestyle.      Relevant Orders   CMP14+EGFR (Completed)   Lipid panel (Completed)   Prediabetes    Update hemoglobin A1c today.  Continue healthy lifestyle modification.      Relevant Orders   CMP14+EGFR (Completed)   Hemoglobin A1c (Completed)    Other Visit Diagnoses    Annual physical exam    -  Primary   Age-appropriate health guidance provided.   Screening for thyroid disorder       Relevant Orders   TSH (Completed)   Screening for metabolic disorder       Screening for cervical cancer       If cytology and HPV are both negative, repeat co-testing in 5 years.   Relevant Orders   Pap IG, CT/NG w/ reflex HPV when ASC-U   Need for tetanus booster       Relevant Orders   Tdap vaccine greater than or equal to 7yo IM (Completed)   Care order/instruction:   Routine screening for STI (sexually transmitted infection)       Relevant Orders   RPR (Completed)   HIV antibody (Completed)   HSV(herpes simplex vrs) 1+2 ab-IgG (Completed)        Fara Chute, PA-C [facility]

## 2018-03-17 NOTE — Progress Notes (Signed)
Subjective:    Patient ID: [name], female    DOB: 01/13/1971, 47 y.o.   MRN: [id number] Chief Complaint  Patient presents with  . Annual Exam    HPI  47 yo female presents for annual exam.  She has recently started PT for her MS to help with walking and strength. Has started PT, currently doing two 6 six-minute sets of biking twice a week.   Cervical Cancer Screening: Today. Breast Cancer Screening: Last screening was 12/17/16, normal according to pt. Colorectal Cancer Screening: in 2 years Bone Density Testing: Not yet a candidate HIV Screening: Completed 11/2013 STI Screening: Today Seasonal Influenza Vaccination: 08/14/2017 Td/Tdap Vaccination: Unsure, completed today. Pneumococcal Vaccination: Not yet a candidate Zoster Vaccination: Not yet a candidate Frequency of Dental evaluation: Appointment this month Frequency of Eye evaluation: Neurologist checks her eye, does not want to go to an eye doctor  Denies chest pain, SOB, N/V/D, palpitations, dysuria, pelvic pain, vaginal discharge.  Review of Systems  Patient Active Problem List   Diagnosis Date Noted  . Other fatigue 08/05/2016  . Migraine without aura and without status migrainosus, not intractable 08/05/2016  . Prediabetes   . MS (multiple sclerosis) ([place])   . Obesity, Class III, BMI 40-49.9 (morbid obesity) ([place]) 10/16/2014  . Smoker 10/16/2014  . OSA on CPAP 10/16/2014  . HTN (hypertension) 01/14/2012  . Sickle cell trait ([place]) 01/14/2012    Past Medical History:  Diagnosis Date  . Allergy   . Anemia   . Anxiety   . Clotting disorder ([place])    from beeing anemic  . Depression   . Hypertension   . Iron overload, transfusional   . Migraine   . MS (multiple sclerosis) ([place])   . OSA on CPAP   . Sickle cell anemia ([facility])    trait  . Sickle cell trait ([place])   . Smoker   . Tachycardia     Prior to Admission medications   Medication Sig Start Date End Date Taking? Authorizing Provider    ALPRAZolam Duanne Moron) 0.5 MG tablet Take 1-1.5 tablets (0.5-0.75 mg total) by mouth 2 (two) times daily as needed for anxiety. 09/08/17  Yes Jeffery, Chelle, PA-C  Cholecalciferol (VITAMIN D3) 2000 units capsule TAKE 1 CAPSULE (2,000 UNITS TOTAL) BY MOUTH DAILY. 12/05/17  Yes Weber, Sarah L, PA-C  Ocrelizumab (OCREVUS IV) Inject into the vein.   Yes [provider]  telmisartan-hydrochlorothiazide (MICARDIS HCT) 80-12.5 MG tablet Take 1 tablet by mouth daily. 05/19/17  Yes Weber, Damaris Hippo, PA-C    Allergies  Allergen Reactions  . Iron Shortness Of Breath    IV only   . Tysabri [Natalizumab] Other (See Comments)    Shortness of breath, joint pain, tremors  . Delsym [Dextromethorphan Polistirex Er] Other (See Comments)    nightmares   Social History   Tobacco Use  . Smoking status: Current Every Day Smoker    Packs/day: 0.50    Years: 25.00    Pack years: 12.50    Types: Cigarettes    Last attempt to quit: 10/16/2014    Years since quitting: 3.4  . Smokeless tobacco: Never Used  . Tobacco comment: 08/15/17 smoke 2-3 cigarettes/day or prn  Substance Use Topics  . Alcohol use: Yes    Alcohol/week: 0.0 oz    Comment: once maybe a month if that  . Drug use: No   Family History  Problem Relation Age of Onset  . Mental illness Mother   . Hyperlipidemia  Mother   . Hyperthyroidism Mother   . Mental retardation Mother   . ADD / ADHD Son   . Diabetes Father   . Cancer Father   . Cancer Paternal Aunt        breast  . Arthritis Maternal Grandmother   . Diabetes Maternal Grandmother   . Hearing loss Maternal Grandmother        left hear when she was a child  . Hyperlipidemia Maternal Grandmother   . Hypertension Maternal Grandmother   . Alcohol abuse Maternal Grandfather   . Early death Maternal Grandfather   . Cancer Paternal Aunt        breast  . Multiple sclerosis Cousin         As above.    Objective:   Physical Exam  Constitutional: She is oriented to person,  place, and time. She appears well-developed and well-nourished. No distress.  BP 132/78 (BP Location: Left Arm, Patient Position: Sitting, Cuff Size: Large)   Pulse 100   Temp 98.3 F (36.8 C) (Oral)   Ht '5\' 3"'$  (1.6 m)   Wt 290 lb (131.5 kg)   LMP 03/10/2018   SpO2 99%   BMI 51.37 kg/m     HENT:  Head: Normocephalic and atraumatic.  Right Ear: External ear normal.  Left Ear: External ear normal.  Nose: Nose normal.  Mouth/Throat: Oropharynx is clear and moist. No oropharyngeal exudate.  Eyes: Conjunctivae and EOM are normal. Right eye exhibits no discharge. Left eye exhibits no discharge. No scleral icterus.  Neck: Normal range of motion. Neck supple. No tracheal deviation present. No thyromegaly present.  Cardiovascular: Normal rate, regular rhythm, normal heart sounds and intact distal pulses. Exam reveals no gallop and no friction rub.  No murmur heard. Pulmonary/Chest: Effort normal and breath sounds normal. No respiratory distress. She has no wheezes. She has no rales.  Abdominal: Soft. Bowel sounds are normal. She exhibits no distension and no mass. There is no tenderness. There is no guarding.  Genitourinary: Vagina normal and uterus normal. There is no rash, tenderness or lesion on the right labia. There is no rash, tenderness or lesion on the left labia.  Musculoskeletal: Normal range of motion. She exhibits no edema or deformity.  Lymphadenopathy:    She has no cervical adenopathy.  Neurological: She is alert and oriented to person, place, and time. She displays normal reflexes. She exhibits normal muscle tone.  Skin: Skin is warm and dry. No rash noted. She is not diaphoretic. No erythema. No pallor.  Psychiatric: She has a normal mood and affect. Her behavior is normal.     Wt Readings from Last 3 Encounters:  03/17/18 290 lb (131.5 kg)  12/27/17 291 lb (132 kg)  09/19/17 289 lb 3.2 oz (131.2 kg)       Assessment & Plan:  1. Annual physical exam Appropriate  preventative care information provided.   2. Prediabetes Await labs and f/u  - CMP14+EGFR - Hemoglobin A1c  3. Essential hypertension Await labs and f/u  - CBC with Differential/Platelet  4. Obesity, Class III, BMI 40-49.9 (morbid obesity) ([place]) Await labs and f/u - CMP14+EGFR - Lipid panel  5. Screening for thyroid disorder Await labs and f/u - TSH  6. Screening for cervical cancer Await labs and f/u - Pap IG, CT/NG w/ reflex HPV when ASC-U  7. Need for tetanus booster Completed. - Tdap vaccine greater than or equal to 7yo IM - Care order/instruction:  8. Routine screening for STI (sexually  transmitted infection) Await labs and f/u  - RPR - HIV antibody - HSV(herpes simplex vrs) 1+2 ab-IgG

## 2018-03-17 NOTE — Patient Instructions (Addendum)
EYE Specialists: [facility]  [address] [place], [place] [address]  Phone: [phone]  [facility] [place], [place], [place] [address]  Phone: [phone]      IF you received an x-ray today, you will receive an invoice from [facility]. Please contact [facility] at [phone] with questions or concerns regarding your invoice.   IF you received labwork today, you will receive an invoice from [facility]. Please contact [facility] at [phone] with questions or concerns regarding your invoice.   Our billing staff will not be able to assist you with questions regarding bills from these companies.  You will be contacted with the lab results as soon as they are available. The fastest way to get your results is to activate your My Chart account. Instructions are located on the last page of this paperwork. If you have not heard from [place] regarding the results in 2 weeks, please contact this office.     Preventive Care 40-64 Years, Female Preventive care refers to lifestyle choices and visits with your health care provider that can promote health and wellness. What does preventive care include?  A yearly physical exam. This is also called an annual well check.  Dental exams once or twice a year.  Routine eye exams. Ask your health care provider how often you should have your eyes checked.  Personal lifestyle choices, including: ? Daily care of your teeth and gums. ? Regular physical activity. ? Eating a healthy diet. ? Avoiding tobacco and drug use. ? Limiting alcohol use. ? Practicing safe sex. ? Taking low-dose aspirin daily starting at age 44. ? Taking vitamin and mineral supplements as recommended by your health care provider. What happens during an annual well check? The services and screenings done by your health care provider during your annual well check will depend on your age, overall health, lifestyle risk factors, and family history  of disease. Counseling Your health care provider may ask you questions about your:  Alcohol use.  Tobacco use.  Drug use.  Emotional well-being.  Home and relationship well-being.  Sexual activity.  Eating habits.  Work and work Statistician.  Method of birth control.  Menstrual cycle.  Pregnancy history.  Screening You may have the following tests or measurements:  Height, weight, and BMI.  Blood pressure.  Lipid and cholesterol levels. These may be checked every 5 years, or more frequently if you are over 34 years old.  Skin check.  Lung cancer screening. You may have this screening every year starting at age 72 if you have a 30-pack-year history of smoking and currently smoke or have quit within the past 15 years.  Fecal occult blood test (FOBT) of the stool. You may have this test every year starting at age 74.  Flexible sigmoidoscopy or colonoscopy. You may have a sigmoidoscopy every 5 years or a colonoscopy every 10 years starting at age 69.  Hepatitis C blood test.  Hepatitis B blood test.  Sexually transmitted disease (STD) testing.  Diabetes screening. This is done by checking your blood sugar (glucose) after you have not eaten for a while (fasting). You may have this done every 1-3 years.  Mammogram. This may be done every 1-2 years. Talk to your health care provider about when you should start having regular mammograms. This may depend on whether you have a family history of breast cancer.  BRCA-related cancer screening. This may be done if you have a family history of breast, ovarian, tubal,  or peritoneal cancers.  Pelvic exam and Pap test. This may be done every 3 years starting at age 62. Starting at age 7, this may be done every 5 years if you have a Pap test in combination with an HPV test.  Bone density scan. This is done to screen for osteoporosis. You may have this scan if you are at high risk for osteoporosis.  Discuss your test results,  treatment options, and if necessary, the need for more tests with your health care provider. Vaccines Your health care provider may recommend certain vaccines, such as:  Influenza vaccine. This is recommended every year.  Tetanus, diphtheria, and acellular pertussis (Tdap, Td) vaccine. You may need a Td booster every 10 years.  Varicella vaccine. You may need this if you have not been vaccinated.  Zoster vaccine. You may need this after age 61.  Measles, mumps, and rubella (MMR) vaccine. You may need at least one dose of MMR if you were born in 1957 or later. You may also need a second dose.  Pneumococcal 13-valent conjugate (PCV13) vaccine. You may need this if you have certain conditions and were not previously vaccinated.  Pneumococcal polysaccharide (PPSV23) vaccine. You may need one or two doses if you smoke cigarettes or if you have certain conditions.  Meningococcal vaccine. You may need this if you have certain conditions.  Hepatitis A vaccine. You may need this if you have certain conditions or if you travel or work in places where you may be exposed to hepatitis A.  Hepatitis B vaccine. You may need this if you have certain conditions or if you travel or work in places where you may be exposed to hepatitis B.  Haemophilus influenzae type b (Hib) vaccine. You may need this if you have certain conditions.  Talk to your health care provider about which screenings and vaccines you need and how often you need them. This information is not intended to replace advice given to you by your health care provider. Make sure you discuss any questions you have with your health care provider. Document Released: 11/27/2015 Document Revised: 07/20/2016 Document Reviewed: 09/01/2015 Elsevier Interactive Patient Education  [employer].    Did you know that you begin to benefit from quitting smoking within the first twenty minutes? It's TRUE.  At 20 minutes: -blood pressure  decreases -pulse rate drops -body temperature of hands and feet increases  At 8 hours: -carbon monoxide level in blood drops to normal -oxygen level in blood increases to normal  At 24 hours: -the chance of heart attack decreases  At 48 hours: -nerve endings start regrowing -ability to smell and taste is enhanced  2 weeks-3 months: -circulation improves -walking becomes easier -lung function improves  1-9 months: -coughing, sinus congestion, fatigue and shortness of breath decreases  1 year: -excess risk of heart disease is decreased to HALF that of a smoker  5 years: Stroke risk is reduced to that of people who have never smoked  10 years: -risk of lung cancer drops to as little as half that of continuing smokers -risk of cancer of the mouth, throat, esophagus, bladder, kidney and pancreas decreases -risk of ulcer decreases  15 years -risk of heart disease is now similar to that of people who have never smoked -risk of death returns to nearly the level of people who have never smoked

## 2018-03-18 LAB — CBC WITH DIFFERENTIAL/PLATELET
BASOS ABS: 0 10*3/uL (ref 0.0–0.2)
Basos: 1 %
EOS (ABSOLUTE): 0.6 10*3/uL — AB (ref 0.0–0.4)
Eos: 7 %
HEMATOCRIT: 35.7 % (ref 34.0–46.6)
HEMOGLOBIN: 10.7 g/dL — AB (ref 11.1–15.9)
IMMATURE GRANS (ABS): 0 10*3/uL (ref 0.0–0.1)
Immature Granulocytes: 0 %
LYMPHS ABS: 2.1 10*3/uL (ref 0.7–3.1)
Lymphs: 25 %
MCH: 20.9 pg — AB (ref 26.6–33.0)
MCHC: 30 g/dL — AB (ref 31.5–35.7)
MCV: 70 fL — ABNORMAL LOW (ref 79–97)
Monocytes Absolute: 0.6 10*3/uL (ref 0.1–0.9)
Monocytes: 7 %
NEUTROS ABS: 5.2 10*3/uL (ref 1.4–7.0)
Neutrophils: 60 %
Platelets: 371 10*3/uL (ref 150–379)
RBC: 5.13 x10E6/uL (ref 3.77–5.28)
RDW: 19.2 % — ABNORMAL HIGH (ref 12.3–15.4)
WBC: 8.5 10*3/uL (ref 3.4–10.8)

## 2018-03-18 LAB — CMP14+EGFR
ALBUMIN: 4.1 g/dL (ref 3.5–5.5)
ALK PHOS: 92 IU/L (ref 39–117)
ALT: 15 IU/L (ref 0–32)
AST: 14 IU/L (ref 0–40)
Albumin/Globulin Ratio: 1.5 (ref 1.2–2.2)
BILIRUBIN TOTAL: 0.2 mg/dL (ref 0.0–1.2)
BUN / CREAT RATIO: 10 (ref 9–23)
BUN: 8 mg/dL (ref 6–24)
CO2: 21 mmol/L (ref 20–29)
CREATININE: 0.8 mg/dL (ref 0.57–1.00)
Calcium: 10.8 mg/dL — ABNORMAL HIGH (ref 8.7–10.2)
Chloride: 107 mmol/L — ABNORMAL HIGH (ref 96–106)
GFR calc non Af Amer: 87 mL/min/{1.73_m2} (ref >59)
GFR, EST AFRICAN AMERICAN: 101 mL/min/{1.73_m2} (ref >59)
GLOBULIN, TOTAL: 2.8 g/dL (ref 1.5–4.5)
Glucose: 80 mg/dL (ref 65–99)
Potassium: 4.5 mmol/L (ref 3.5–5.2)
SODIUM: 142 mmol/L (ref 134–144)
TOTAL PROTEIN: 6.9 g/dL (ref 6.0–8.5)

## 2018-03-18 LAB — HSV(HERPES SIMPLEX VRS) I + II AB-IGG
HSV 1 Glycoprotein G Ab, IgG: 5.05 index — ABNORMAL HIGH (ref 0.00–0.90)
HSV 2 IgG, Type Spec: 14.9 index — ABNORMAL HIGH (ref 0.00–0.90)

## 2018-03-18 LAB — HIV ANTIBODY (ROUTINE TESTING W REFLEX): HIV Screen 4th Generation wRfx: NONREACTIVE

## 2018-03-18 LAB — HEMOGLOBIN A1C
Est. average glucose Bld gHb Est-mCnc: 120 mg/dL
HEMOGLOBIN A1C: 5.8 % — AB (ref 4.8–5.6)

## 2018-03-18 LAB — LIPID PANEL
CHOL/HDL RATIO: 3.8 ratio (ref 0.0–4.4)
Cholesterol, Total: 168 mg/dL (ref 100–199)
HDL: 44 mg/dL (ref >39)
LDL CALC: 107 mg/dL — AB (ref 0–99)
TRIGLYCERIDES: 84 mg/dL (ref 0–149)
VLDL CHOLESTEROL CAL: 17 mg/dL (ref 5–40)

## 2018-03-18 LAB — TSH: TSH: 0.985 u[IU]/mL (ref 0.450–4.500)

## 2018-03-18 LAB — RPR: RPR Ser Ql: NONREACTIVE

## 2018-03-18 NOTE — Assessment & Plan Note (Signed)
Update hemoglobin A1c today.  Continue healthy lifestyle modification.

## 2018-03-18 NOTE — Assessment & Plan Note (Signed)
Continue working on lifestyle changes.  Hope physical therapy will help to improve her exercise tolerance and allow more active lifestyle.

## 2018-03-18 NOTE — Assessment & Plan Note (Signed)
Controlled.  Continue Micardis HCTZ-12.5 mg daily.

## 2018-03-20 DIAGNOSIS — R260 Ataxic gait: Secondary | ICD-10-CM | POA: Diagnosis not present

## 2018-03-21 ENCOUNTER — Telehealth: Payer: Self-pay | Admitting: *Deleted

## 2018-03-21 LAB — PAP IG, CT-NG, RFX HPV ASCU
Chlamydia, Nuc. Acid Amp: NEGATIVE
Gonococcus by Nucleic Acid Amp: NEGATIVE
PAP SMEAR COMMENT: 0

## 2018-03-21 NOTE — Telephone Encounter (Signed)
RN told by Otila Kluver in intrafusion that pt needs updated progress from last Ocrevus infusion in order for reauthorization.  Pt due for infusion now. D/w Dr. Leta Baptist. Pt called and scheduled for office visit Mon 5/13 @ 1:00 arrival time 12:30. She verbalized understanding.

## 2018-03-23 ENCOUNTER — Encounter: Payer: Self-pay | Admitting: Physician Assistant

## 2018-03-26 ENCOUNTER — Encounter: Payer: Self-pay | Admitting: Diagnostic Neuroimaging

## 2018-03-26 ENCOUNTER — Ambulatory Visit: Payer: 59 | Admitting: Diagnostic Neuroimaging

## 2018-03-26 VITALS — BP 143/94 | HR 92 | Ht 63.0 in | Wt 299.4 lb

## 2018-03-26 DIAGNOSIS — G4733 Obstructive sleep apnea (adult) (pediatric): Secondary | ICD-10-CM | POA: Diagnosis not present

## 2018-03-26 DIAGNOSIS — R5383 Other fatigue: Secondary | ICD-10-CM | POA: Diagnosis not present

## 2018-03-26 DIAGNOSIS — G35 Multiple sclerosis: Secondary | ICD-10-CM | POA: Diagnosis not present

## 2018-03-26 DIAGNOSIS — Z9989 Dependence on other enabling machines and devices: Secondary | ICD-10-CM | POA: Diagnosis not present

## 2018-03-26 NOTE — Telephone Encounter (Signed)
Note given to Tasha in intrafusion.

## 2018-03-26 NOTE — Progress Notes (Signed)
[facility]  PATIENT: [name] DOB: May 14, 1971  REFERRING CLINICIAN:  HISTORY FROM: patient REASON FOR VISIT: follow up   HISTORICAL  CHIEF COMPLAINT:  Chief Complaint  Patient presents with  . Follow-up  . Multiple Sclerosis    Ocrevus (needs documentation in ovf note of pts progress on medication). Last infusion with intrafusion was 10/02/2017, due May 2019    HISTORY OF PRESENT ILLNESS:   UPDATE (03/26/18, VRP): Since last visit, doing well. Tolerating ocrevus, although she had some SOB ~ 4 hours after 1st infusion. Went to ER and symptoms resolved. After 2nd dose, has similar but milder symptoms. No alleviating or aggravating factors. Other neuro symptoms are stable to improved.  UPDATE (08/15/17, VRP): Since last visit, Had MRI of the brain which showed new chronic demyelinating plaques. Also one left temporal lesion had slight contrast enhancement. Patient was asked to come in today to discuss medication options. Patient has had some cognitive foggy sensation since hearing of knees that her MRI had progressed. Patient thinks this is related to her worrying about her MS progression. No new numbness or tingling. She has noticed her gait and balance is slightly off.  UPDATE (08/02/17, VRP): Since last visit, doing well. Tolerating copaxone. No alleviating or aggravating factors. No more headaches. Fatigue continues. Body aches are mild. Working 40+ hours per week.  UPDATE 01/06/17: Since last visit, MS sxs stable. HA resolved, so stopped TPX. Only using rizatriptan as needed. Overall doing well. Some work related stress. Tolerating copaxone generic. Some injection site reactions in legs, so she is only injecting in arms/stomach/hips, and doing well.   UPDATE 08/05/16: Since last visit, has transitioned to copaxone since Aug 2017. MRI brain reviewed. Having some intermittent HA, esp with weather changes. HA --> bitemporal, retro-orbital, aching, + photophobia, -  phonophobia, -N/V; last for 1-2 days; happening 1 per week. These HA have been present since 2014. Never been on preventative meds. Has tried maxalt in 2014 with some benefit.  UPDATE 04/29/16: Since last visit, had possible infusion reaction on 03/30/16 here at infusion center (shaking, chills, malaise), in setting of URI and recently starting z-pack. Given benedryl and solumedrol, then went to ER for observation. Had full recovery. This past week, had tysabri infusion last Wednesday (04/27/16) and was doing well initially, until she developed joint pain (knees, shoulder). Then infusion stopped. Then patient returned home, then developed shaking chills, and went to urgent care for observation. Now doing well. Here to discuss other MS med options.  UPDATE 01/22/16: Since last visit, doing well. Some fatigue on busy work days (Mon and Fri). No new neuro sxs. Tolerating tysabri. Some joint aches pains in arms and wrists.   UPDATE 09/07/15: Since last visit, doing well. Tolerating tysabri. No new events neurologically. Some more leg swelling lately, esp with sitting for long time at work. 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  UPDATE 06/02/15: Doing well. Strength good. No numbness. Fatigue better. Concentration is better. Intermittent word finding diff (scanning speech). Tolerating tysabri infusions. 3 weeks ago noted fine small bumps on arms, mild itching. Some int headaches in temporal regions, mild.   UPDATE 03/03/15: Since last visit, more right hip and low back pain. Intermittent. Had tysabri on March 14 and Apr 11. Overall c/o fatigue, focusing issues. Planning to take CMA cert testing Oct 5329.  UPDATE 12/01/14: Since last visit, left side weakness has improved. Now  having more problems with right knee pain, esp walking longer distances. Awaiting tysabri scheduling.   PRIOR HPI (10/29/14): 47 year old right-handed female here for evaluation of  multiple sclerosis. 10/15/14 patient developed staggering to the left side. The next day she noted left arm and left leg heaviness and weakness. Patient was in the hospital and had evaluation. MRI of the brain demonstrated multiple brain lesions suspicious for demyelinating disease. Patient was treated with IV steroids and her left sided symptoms have almost completely resolved. Patient continues to have a slight limp with left leg difficulty. Also has some slight incoordination with left hand. In the past patient has had intermittent headaches. Patient has also had intermittent episodes of knee pain and intermittent vertigo attacks. Patient has family history of multiple sclerosis in maternal uncle and maternal cousin.    REVIEW OF SYSTEMS: Full 14 system review of systems performed and negative except: as per HPI.    ALLERGIES: Allergies  Allergen Reactions  . Iron Shortness Of Breath    IV only   . Tysabri [Natalizumab] Other (See Comments)    Shortness of breath, joint pain, tremors  . Delsym [Dextromethorphan Polistirex Er] Other (See Comments)    nightmares    HOME MEDICATIONS: Outpatient Medications Prior to Visit  Medication Sig Dispense Refill  . ALPRAZolam (XANAX) 0.5 MG tablet Take 1-1.5 tablets (0.5-0.75 mg total) by mouth 2 (two) times daily as needed for anxiety. 60 tablet 0  . Ocrelizumab (OCREVUS IV) Inject into the vein.    Marland Kitchen telmisartan-hydrochlorothiazide (MICARDIS HCT) 80-12.5 MG tablet Take 1 tablet by mouth daily. 90 tablet 3  . Cholecalciferol (VITAMIN D3) 2000 units capsule TAKE 1 CAPSULE (2,000 UNITS TOTAL) BY MOUTH DAILY. (Patient not taking: Reported on 03/26/2018) 90 capsule 4   No facility-administered medications prior to visit.     PAST MEDICAL HISTORY: Past Medical History:  Diagnosis Date  . Allergy   . Anemia   . Anxiety   . Clotting disorder ([place])    from beeing anemic  . Depression   . Hypertension   . Iron overload, transfusional   . Migraine    . MS (multiple sclerosis) ([place])   . OSA on CPAP   . Sickle cell anemia ([facility])    trait  . Sickle cell trait ([place])   . Smoker   . Tachycardia     PAST SURGICAL HISTORY: Past Surgical History:  Procedure Laterality Date  . CERVICAL ABLATION    . CESAREAN SECTION     3 times  . CHOLECYSTECTOMY    . TUBAL LIGATION      FAMILY HISTORY: Family History  Problem Relation Age of Onset  . Mental illness Mother   . Hyperlipidemia Mother   . Hyperthyroidism Mother   . Mental retardation Mother   . ADD / ADHD Son   . Diabetes Father   . Cancer Father   . Cancer Paternal Aunt        breast  . Arthritis Maternal Grandmother   . Diabetes Maternal Grandmother   . Hearing loss Maternal Grandmother        left hear when she was a child  . Hyperlipidemia Maternal Grandmother   . Hypertension Maternal Grandmother   . Alcohol abuse Maternal Grandfather   . Early death Maternal Grandfather   . Cancer Paternal Aunt        breast  . Multiple sclerosis Cousin     SOCIAL HISTORY:  Social History   Socioeconomic History  . Marital status:  Single    Spouse name: N/A  . Number of children: 3  . Years of education: 12+  . Highest education level: Not on file  Occupational History  . 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, answers calls    Employer: [employer] [place] [place]: [employer]  . Financial resource strain: Hard  . Food insecurity:    Worry: Sometimes true    Inability: Never true  . Transportation needs:    Medical: No    Non-medical: No  Tobacco Use  . Smoking status: Current Every Day Smoker    Packs/day: 0.50    Years: 25.00    Pack years: 12.50    Types: Cigarettes    Last attempt to quit: 10/16/2014    Years since quitting: 3.4  . Smokeless tobacco: Never Used  . Tobacco comment: 08/15/17 smoke 2-3 cigarettes/day or prn  Substance and Sexual Activity  . Alcohol use: Yes    Alcohol/week: 0.0 oz    Comment: once maybe a month if that   . Drug use: No  . Sexual activity: Yes    Partners: Male    Birth control/protection: Condom  Lifestyle  . Physical activity:    Days per week: 0 days    Minutes per session: 0 min  . Stress: Rather much  Relationships  . Social connections:    Talks on phone: More than three times a week    Gets together: More than three times a week    Attends religious service: More than 4 times per year    Active member of club or organization: No    Attends meetings of clubs or organizations: Never    Relationship status: Divorced  . Intimate partner violence:    Fear of current or ex partner: No    Emotionally abused: No    Physically abused: No    Forced sexual activity: No  Other Topics Concern  . Not on file  Social History Narrative   Patient lives at home with her family.   Working at the call center for [employer].      PHYSICAL EXAM  Vitals:   03/26/18 1242  BP: (!) 143/94  Pulse: 92  Weight: 299 lb 6.4 oz (135.8 kg)  Height: 5\' 3"  (1.6 m)   Wt Readings from Last 3 Encounters:  03/26/18 299 lb 6.4 oz (135.8 kg)  03/17/18 290 lb (131.5 kg)  12/27/17 291 lb (132 kg)   Body mass index is 53.04 kg/m.   Visual Acuity Screening   Right eye Left eye Both eyes  Without correction: 20/30 20/40   With correction:       No flowsheet data found.  GENERAL EXAM: Patient is in no distress; well developed, nourished and groomed; neck is supple  CARDIOVASCULAR: Regular rate and rhythm, no murmurs, no carotid bruits  NEUROLOGIC: MENTAL STATUS: awake, alert, language fluent, comprehension intact, naming intact, fund of knowledge appropriate CRANIAL NERVE: pupils equal and reactive to light, visual fields full to confrontation, extraocular muscles intact, no nystagmus, facial sensation and strength symmetric, hearing intact, palate elevates symmetrically, uvula midline, shoulder shrug symmetric, tongue midline. MOTOR: normal bulk and tone, full strength in the BUE,  BLE SENSORY: normal and symmetric to light touch COORDINATION: finger-nose-finger, fine finger movements normal REFLEXES: deep tendon reflexes present and symmetric GAIT/STATION: narrow based gait; STABLE, SMOOTH     DIAGNOSTIC DATA (LABS, IMAGING, TESTING) - I reviewed patient records, labs, notes, testing and imaging myself where  available.  Lab Results  Component Value Date   WBC 8.5 03/17/2018   HGB 10.7 (L) 03/17/2018   HCT 35.7 03/17/2018   MCV 70 (L) 03/17/2018   PLT 371 03/17/2018      Component Value Date/Time   NA 142 03/17/2018 1405   K 4.5 03/17/2018 1405   CL 107 (H) 03/17/2018 1405   CO2 21 03/17/2018 1405   GLUCOSE 80 03/17/2018 1405   GLUCOSE 97 09/19/2017 1314   BUN 8 03/17/2018 1405   CREATININE 0.80 03/17/2018 1405   CREATININE 0.75 11/11/2015 0828   CALCIUM 10.8 (H) 03/17/2018 1405   PROT 6.9 03/17/2018 1405   ALBUMIN 4.1 03/17/2018 1405   AST 14 03/17/2018 1405   ALT 15 03/17/2018 1405   ALKPHOS 92 03/17/2018 1405   BILITOT 0.2 03/17/2018 1405   GFRNONAA 87 03/17/2018 1405   GFRNONAA >89 11/11/2015 0828   GFRAA 101 03/17/2018 1405   GFRAA >89 11/11/2015 0828   Lab Results  Component Value Date   CHOL 168 03/17/2018   HDL 44 03/17/2018   LDLCALC 107 (H) 03/17/2018   TRIG 84 03/17/2018   CHOLHDL 3.8 03/17/2018   Lab Results  Component Value Date   HGBA1C 5.8 (H) 03/17/2018   No results found for: [id number] Lab Results  Component Value Date   TSH 0.985 03/17/2018   Vit D, 25-Hydroxy  Date Value Ref Range Status  03/28/2017 24.3 (L) 30.0 - 100.0 ng/mL Final    Comment:    Vitamin D deficiency has been defined by the Institute of Medicine and an Endocrine Society practice guideline as a level of serum 25-OH vitamin D less than 20 ng/mL (1,2). The Endocrine Society went on to further define vitamin D insufficiency as a level between 21 and 29 ng/mL (2). 1. IOM (Institute of Medicine). 2010. Dietary reference    intakes for calcium  and D. [place]: The    [employer]. 2. Holick MF, Binkley [place], Bischoff-Ferrari HA, et al.    Evaluation, treatment, and prevention of vitamin D    deficiency: an Endocrine Society clinical practice    guideline. JCEM. 2011 Jul; 96(7):1911-30.     10/17/14 MRI brain (without)  1. Multiple T2/FLAIR hyperintense lesions with associated restricted diffusion involving the white matter of both cerebral hemispheres, right greater than left, most consistent with active demyelinating disease. 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 Follow-up examination with postcontrast imaging could be performed for complete evaluation. 2. No other findings to suggest acute intracranial infarct or other process.  10/17/14 MRI cervical spine (with and without)  - Motion degraded exam demonstrating no convincing evidence for spinal MS.  Certainly there are no areas of T2 hyperintensity or cord enlargement. No disc protrusion or spinal stenosis.  10/18/14 MRI brain (with) - Findings consistent with acute demyelination in the RIGHT posterior frontal subcortical white matter in this patient with suspected multiple sclerosis. Multiple other areas of T2 signal hyperintensity on the previous study do not display similar postcontrast enhancement. 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  07/08/15 MRI brain [I reviewed images myself and agree with interpretation. -VRP]  1. Regression of multiple sclerosis since December 2015. No active demyelination. 2. Tiny 5 mm right posterior convexity meningioma versus benign dural thickening. This is stable and  appears inconsequential.  07/09/16 MRI brain [I reviewed images myself and agree  with interpretation. -VRP]  - Progression of white matter hyperintensity in the right temporoparietal lobe consistent with progressive demyelinating disease. New area of demyelination in the left posterior frontal lobe is small measuring 5 mm. No evidence of acute demyelinization. - Stable small posterior right parietal meningioma.  08/08/17 MRI brain [I reviewed images myself and agree with interpretation. -VRP]  1. Multiple sclerosis with 5 new or larger plaques in the cerebral white matter when compared to 07/09/2016. One of these, in the left temporal lobe, is enhancing. 2. Stable normal brain volume. 3. Stable 6 mm right parietal meningioma.  10/21/14 CSF opening pressure 19cm H2O - (WBC 18, RBC 3545, protein 50, glucose 110, cryptococcal ag negative, IgG index 7.1 (normal), OCB > 5 well defined gamma restriction bands)  10/29/14 JCV ab - negative  08/28/15 JCV ab - negative  08/28/15 JCV AB: negative, 0.40  01/22/16 JCV positive 0.46  04/29/16 JCV 0.44 positive  04/29/16 anti-tysabri ab - POSITIVE     ASSESSMENT AND PLAN  47 y.o. 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 Was on tysabri since Jan 2016 and doing very well, until 2 infusion reactions in May 2017 and Jun 2017, with positive anti-tysabri antibodies. Now on copaxone (generic) since Aug 2017.  Now with subclinical MRI progression in September 2018; now transitioned from Copaxone to [facility] in Nov 2018. Neuro symptoms stable. Has some possible reaction after first dose (med reaction vs anxiety).    Dx:  MS (multiple sclerosis) ([place])  Other fatigue  OSA on CPAP  Morbid obesity ([place]) - Plan: Ambulatory referral to Family Practice     PLAN:  I spent 40 minutes of face to face time with patient. Greater than 50% of  time was spent in counseling and coordination of care with patient. In summary we discussed:   MULTIPLE SCLEROSIS (established problem, stable) - continue ocrevus; medically necessary due to symptoms and imaging progression on copaxone - continue physical activity, PT exercises  OBESITY - refer to medical weight mgmt  MIGRAINE HEADACHES - monitor as needed  JOINT ACHES / PAINS - improved; continue to optimize nutrition and fitness  MENINGIOMA (small, asymptomatic) - incidental finding; reassured patient  Orders Placed This Encounter  Procedures  . Ambulatory referral to [facility]   Return in about 6 months (around 09/26/2018).    Penni Bombard, MD 06/08/3663, 40:34 PM Certified in Neurology, Neurophysiology and Neuroimaging  [facility] [address] [place],  [address] [phone]

## 2018-04-06 DIAGNOSIS — G4733 Obstructive sleep apnea (adult) (pediatric): Secondary | ICD-10-CM | POA: Diagnosis not present

## 2018-04-22 ENCOUNTER — Encounter: Payer: Self-pay | Admitting: Physician Assistant

## 2018-04-22 DIAGNOSIS — R768 Other specified abnormal immunological findings in serum: Secondary | ICD-10-CM | POA: Insufficient documentation

## 2018-04-22 DIAGNOSIS — R7689 Other specified abnormal immunological findings in serum: Secondary | ICD-10-CM | POA: Insufficient documentation

## 2018-04-26 ENCOUNTER — Encounter (INDEPENDENT_AMBULATORY_CARE_PROVIDER_SITE_OTHER): Payer: Self-pay

## 2018-04-30 ENCOUNTER — Encounter (INDEPENDENT_AMBULATORY_CARE_PROVIDER_SITE_OTHER): Payer: Self-pay

## 2018-05-28 DIAGNOSIS — G35 Multiple sclerosis: Secondary | ICD-10-CM | POA: Diagnosis not present

## 2018-06-12 ENCOUNTER — Ambulatory Visit: Payer: 59 | Admitting: Diagnostic Neuroimaging

## 2018-06-12 ENCOUNTER — Encounter: Payer: Self-pay | Admitting: Diagnostic Neuroimaging

## 2018-06-12 VITALS — BP 128/83 | HR 85 | Ht 63.0 in | Wt 299.2 lb

## 2018-06-12 DIAGNOSIS — G35 Multiple sclerosis: Secondary | ICD-10-CM | POA: Diagnosis not present

## 2018-06-12 DIAGNOSIS — G43009 Migraine without aura, not intractable, without status migrainosus: Secondary | ICD-10-CM | POA: Diagnosis not present

## 2018-06-12 DIAGNOSIS — G4733 Obstructive sleep apnea (adult) (pediatric): Secondary | ICD-10-CM | POA: Diagnosis not present

## 2018-06-12 DIAGNOSIS — Z9989 Dependence on other enabling machines and devices: Secondary | ICD-10-CM

## 2018-06-12 DIAGNOSIS — Z79899 Other long term (current) drug therapy: Secondary | ICD-10-CM | POA: Insufficient documentation

## 2018-06-12 DIAGNOSIS — R5383 Other fatigue: Secondary | ICD-10-CM

## 2018-06-12 NOTE — Progress Notes (Signed)
[facility]  PATIENT: [name] DOB: August 13, 1971  REFERRING CLINICIAN:  HISTORY FROM: patient REASON FOR VISIT: follow up   HISTORICAL  CHIEF COMPLAINT:  Chief Complaint  Patient presents with  . Follow-up  . Multiple Sclerosis    Last week ocrevus 05/2018,IV thru intrafusion.  Has bil leg swelling feels like getting worse.  No incontinence.  Some L eye blurriness at times.      HISTORY OF PRESENT ILLNESS:   UPDATE (03/26/18, VRP): Since last visit, doing well. Tolerating ocrevus, although she had some SOB ~ 4 hours after 1st infusion. Went to ER and symptoms resolved. After 2nd dose, has similar but milder symptoms. No alleviating or aggravating factors. Other neuro symptoms are stable to improved.  UPDATE (08/15/17, VRP): Since last visit, Had MRI of the brain which showed new chronic demyelinating plaques. Also one left temporal lesion had slight contrast enhancement. Patient was asked to come in today to discuss medication options. Patient has had some cognitive foggy sensation since hearing of knees that her MRI had progressed. Patient thinks this is related to her worrying about her MS progression. No new numbness or tingling. She has noticed her gait and balance is slightly off.  UPDATE (08/02/17, VRP): Since last visit, doing well. Tolerating copaxone. No alleviating or aggravating factors. No more headaches. Fatigue continues. Body aches are mild. Working 40+ hours per week.  UPDATE 01/06/17: Since last visit, MS sxs stable. HA resolved, so stopped TPX. Only using rizatriptan as needed. Overall doing well. Some work related stress. Tolerating copaxone generic. Some injection site reactions in legs, so she is only injecting in arms/stomach/hips, and doing well.   UPDATE 08/05/16: Since last visit, has transitioned to copaxone since Aug 2017. MRI brain reviewed. Having some intermittent HA, esp with weather changes. HA --> bitemporal, retro-orbital, aching, +  photophobia, - phonophobia, -N/V; last for 1-2 days; happening 1 per week. These HA have been present since 2014. Never been on preventative meds. Has tried maxalt in 2014 with some benefit.  UPDATE 04/29/16: Since last visit, had possible infusion reaction on 03/30/16 here at infusion center (shaking, chills, malaise), in setting of URI and recently starting z-pack. Given benedryl and solumedrol, then went to ER for observation. Had full recovery. This past week, had tysabri infusion last Wednesday (04/27/16) and was doing well initially, until she developed joint pain (knees, shoulder). Then infusion stopped. Then patient returned home, then developed shaking chills, and went to urgent care for observation. Now doing well. Here to discuss other MS med options.  UPDATE 01/22/16: Since last visit, doing well. Some fatigue on busy work days (Mon and Fri). No new neuro sxs. Tolerating tysabri. Some joint aches pains in arms and wrists.   UPDATE 09/07/15: Since last visit, doing well. Tolerating tysabri. No new events neurologically. Some more leg swelling lately, esp with sitting for long time at work. 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  UPDATE 06/02/15: Doing well. Strength good. No numbness. Fatigue better. Concentration is better. Intermittent word finding diff (scanning speech). Tolerating tysabri infusions. 3 weeks ago noted fine small bumps on arms, mild itching. Some int headaches in temporal regions, mild.   UPDATE 03/03/15: Since last visit, more right hip and low back pain. Intermittent. Had tysabri on March 14 and Apr 11. Overall c/o fatigue, focusing issues. 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  UPDATE 12/01/14: Since last  visit, left side weakness has improved. Now having more problems with right knee pain, esp walking longer distances. Awaiting tysabri scheduling.   PRIOR HPI (10/29/14): 47 year old right-handed female here  for evaluation of multiple sclerosis. 10/15/14 patient developed staggering to the left side. The next day she noted left arm and left leg heaviness and weakness. Patient was in the hospital and had evaluation. MRI of the brain demonstrated multiple brain lesions suspicious for demyelinating disease. Patient was treated with IV steroids and her left sided symptoms have almost completely resolved. Patient continues to have a slight limp with left leg difficulty. Also has some slight incoordination with left hand. In the past patient has had intermittent headaches. Patient has also had intermittent episodes of knee pain and intermittent vertigo attacks. Patient has family history of multiple sclerosis in maternal uncle and maternal cousin.    REVIEW OF SYSTEMS: Full 14 system review of systems performed and negative except: as per HPI.    ALLERGIES: Allergies  Allergen Reactions  . Iron Shortness Of Breath    IV only   . Tysabri [Natalizumab] Other (See Comments)    Shortness of breath, joint pain, tremors  . Delsym [Dextromethorphan Polistirex Er] Other (See Comments)    nightmares    HOME MEDICATIONS: Outpatient Medications Prior to Visit  Medication Sig Dispense Refill  . ALPRAZolam (XANAX) 0.5 MG tablet Take 1-1.5 tablets (0.5-0.75 mg total) by mouth 2 (two) times daily as needed for anxiety. 60 tablet 0  . Ocrelizumab (OCREVUS IV) Inject into the vein.    Marland Kitchen telmisartan-hydrochlorothiazide (MICARDIS HCT) 80-12.5 MG tablet Take 1 tablet by mouth daily. 90 tablet 3  . Cholecalciferol (VITAMIN D3) 2000 units capsule TAKE 1 CAPSULE (2,000 UNITS TOTAL) BY MOUTH DAILY. (Patient not taking: Reported on 03/26/2018) 90 capsule 4   No facility-administered medications prior to visit.     PAST MEDICAL HISTORY: Past Medical History:  Diagnosis Date  . Allergy   . Anemia   . Anxiety   . Clotting disorder ([place])    from beeing anemic  . Depression   . Hypertension   . Iron overload,  transfusional   . Migraine   . MS (multiple sclerosis) ([place])   . OSA on CPAP   . Sickle cell anemia (HCC)    trait  . Sickle cell trait ([place])   . Smoker   . Tachycardia     PAST SURGICAL HISTORY: Past Surgical History:  Procedure Laterality Date  . CERVICAL ABLATION    . CESAREAN SECTION     3 times  . CHOLECYSTECTOMY    . TUBAL LIGATION      FAMILY HISTORY: Family History  Problem Relation Age of Onset  . Mental illness Mother   . Hyperlipidemia Mother   . Hyperthyroidism Mother   . Mental retardation Mother   . ADD / ADHD Son   . Diabetes Father   . Cancer Father   . Cancer Paternal Aunt        breast  . Arthritis Maternal Grandmother   . Diabetes Maternal Grandmother   . Hearing loss Maternal Grandmother        left hear when she was a child  . Hyperlipidemia Maternal Grandmother   . Hypertension Maternal Grandmother   . Alcohol abuse Maternal Grandfather   . Early death Maternal Grandfather   . Cancer Paternal Aunt        breast  . Multiple sclerosis Cousin     SOCIAL HISTORY:  Social History  Socioeconomic History  . Marital status: Divorced    Spouse name: N/A  . Number of children: 3  . Years of education: 12+  . Highest education level: Not on file  Occupational History  . 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, answers calls    Employer: [place]: [employer]  . Financial resource strain: Hard  . Food insecurity:    Worry: Sometimes true    Inability: Never true  . Transportation needs:    Medical: No    Non-medical: No  Tobacco Use  . Smoking status: Current Every Day Smoker    Packs/day: 0.50    Years: 25.00    Pack years: 12.50    Types: Cigarettes    Last attempt to quit: 10/16/2014    Years since quitting: 3.6  . Smokeless tobacco: Never Used  . Tobacco comment: 08/15/17 smoke 2-3 cigarettes/day or prn  Substance and Sexual Activity  . Alcohol use: Yes    Alcohol/week: 0.0 oz     Comment: once maybe a month if that  . Drug use: No  . Sexual activity: Yes    Partners: Male    Birth control/protection: Condom  Lifestyle  . Physical activity:    Days per week: 0 days    Minutes per session: 0 min  . Stress: Rather much  Relationships  . Social connections:    Talks on phone: More than three times a week    Gets together: More than three times a week    Attends religious service: More than 4 times per year    Active member of club or organization: No    Attends meetings of clubs or organizations: Never    Relationship status: Divorced  . Intimate partner violence:    Fear of current or ex partner: No    Emotionally abused: No    Physically abused: No    Forced sexual activity: No  Other Topics Concern  . Not on file  Social History Narrative   Patient lives at home with her family.   Working at the call center for [employer].      PHYSICAL EXAM  GENERAL EXAM/CONSTITUTIONAL: Vitals:  Vitals:   06/12/18 0904  BP: 128/83  Pulse: 85  Weight: 299 lb 3.2 oz (135.7 kg)  Height: 5\' 3"  (1.6 m)     Body mass index is 53 kg/m. Wt Readings from Last 3 Encounters:  06/12/18 299 lb 3.2 oz (135.7 kg)  03/26/18 299 lb 6.4 oz (135.8 kg)  03/17/18 290 lb (131.5 kg)     Patient is in no distress; well developed, nourished and groomed; neck is supple  CARDIOVASCULAR:  Examination of carotid arteries is normal; no carotid bruits  Regular rate and rhythm, no murmurs  Examination of peripheral vascular system by observation and palpation is normal  EYES:  Ophthalmoscopic exam of optic discs and posterior segments is normal; no papilledema or hemorrhages  Visual Acuity Screening   Right eye Left eye Both eyes  Without correction: 20/30 20/30   With correction:        MUSCULOSKELETAL:  Gait, strength, tone, movements noted in Neurologic exam below  NEUROLOGIC: MENTAL STATUS:  No flowsheet data found.  awake, alert, oriented to person,  place and time  recent and remote memory intact  normal attention and concentration  language fluent, comprehension intact, naming intact  fund of knowledge appropriate  CRANIAL NERVE:   2nd - no papilledema on fundoscopic exam  2nd, 3rd, 4th, 6th - pupils equal and reactive to light, visual fields full to confrontation, extraocular muscles intact, no nystagmus  5th - facial sensation symmetric  7th - facial strength symmetric  8th - hearing intact  9th - palate elevates symmetrically, uvula midline  11th - shoulder shrug symmetric  12th - tongue protrusion midline  MOTOR:   normal bulk and tone, full strength in the BUE, BLE  SENSORY:   normal and symmetric to light touch  COORDINATION:   finger-nose-finger, fine finger movements normal  REFLEXES:   deep tendon reflexes TRACE and symmetric  GAIT/STATION:   narrow based gait       DIAGNOSTIC DATA (LABS, IMAGING, TESTING) - I reviewed patient records, labs, notes, testing and imaging myself where available.  Lab Results  Component Value Date   WBC 8.5 03/17/2018   HGB 10.7 (L) 03/17/2018   HCT 35.7 03/17/2018   MCV 70 (L) 03/17/2018   PLT 371 03/17/2018      Component Value Date/Time   NA 142 03/17/2018 1405   K 4.5 03/17/2018 1405   CL 107 (H) 03/17/2018 1405   CO2 21 03/17/2018 1405   GLUCOSE 80 03/17/2018 1405   GLUCOSE 97 09/19/2017 1314   BUN 8 03/17/2018 1405   CREATININE 0.80 03/17/2018 1405   CREATININE 0.75 11/11/2015 0828   CALCIUM 10.8 (H) 03/17/2018 1405   PROT 6.9 03/17/2018 1405   ALBUMIN 4.1 03/17/2018 1405   AST 14 03/17/2018 1405   ALT 15 03/17/2018 1405   ALKPHOS 92 03/17/2018 1405   BILITOT 0.2 03/17/2018 1405   GFRNONAA 87 03/17/2018 1405   GFRNONAA >89 11/11/2015 0828   GFRAA 101 03/17/2018 1405   GFRAA >89 11/11/2015 0828   Lab Results  Component Value Date   CHOL 168 03/17/2018   HDL 44 03/17/2018   LDLCALC 107 (H) 03/17/2018   TRIG 84 03/17/2018    CHOLHDL 3.8 03/17/2018   Lab Results  Component Value Date   HGBA1C 5.8 (H) 03/17/2018   No results found for: ENIDPOEU23 Lab Results  Component Value Date   TSH 0.985 03/17/2018   Vit D, 25-Hydroxy  Date Value Ref Range Status  03/28/2017 24.3 (L) 30.0 - 100.0 ng/mL Final    Comment:    Vitamin D deficiency has been defined by the Institute of Medicine and an Endocrine Society practice guideline as a level of serum 25-OH vitamin D less than 20 ng/mL (1,2). The Endocrine Society went on to further define vitamin D insufficiency as a level between 21 and 29 ng/mL (2). 1. IOM (Institute of Medicine). 2010. Dietary reference    intakes for calcium and D. [place]: The    [employer]. 2. Holick MF, Binkley [place], Bischoff-Ferrari HA, et al.    Evaluation, treatment, and prevention of vitamin D    deficiency: an Endocrine Society clinical practice    guideline. JCEM. 2011 Jul; 96(7):1911-30.     10/17/14 MRI brain (without)  1. Multiple T2/FLAIR hyperintense lesions with associated restricted diffusion involving the white matter of both cerebral hemispheres, right greater than left, most consistent with active demyelinating disease. 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 Follow-up examination with postcontrast imaging could be performed for complete evaluation. 2. No other findings to suggest acute intracranial infarct or other process.  10/17/14 MRI cervical spine (with and without)  - Motion degraded exam demonstrating no convincing evidence for  spinal MS.  Certainly there are no areas of T2 hyperintensity or cord enlargement. No disc protrusion or spinal stenosis.  10/18/14 MRI brain (with) - Findings consistent with acute demyelination in the RIGHT posterior frontal subcortical white matter in this patient with suspected multiple sclerosis.  Multiple other areas of T2 signal hyperintensity on the previous study do not display similar postcontrast enhancement. 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  07/08/15 MRI brain [I reviewed images myself and agree with interpretation. -VRP]  1. Regression of multiple sclerosis since December 2015. No active demyelination. 2. Tiny 5 mm right posterior convexity meningioma versus benign dural thickening. This is stable and appears inconsequential.  07/09/16 MRI brain [I reviewed images myself and agree with interpretation. -VRP]  - Progression of white matter hyperintensity in the right temporoparietal lobe consistent with progressive demyelinating disease. New area of demyelination in the left posterior frontal lobe is small measuring 5 mm. No evidence of acute demyelinization. - Stable small posterior right parietal meningioma.  08/08/17 MRI brain [I reviewed images myself and agree with interpretation. -VRP]  1. Multiple sclerosis with 5 new or larger plaques in the cerebral white matter when compared to 07/09/2016. One of these, in the left temporal lobe, is enhancing. 2. Stable normal brain volume. 3. Stable 6 mm right parietal meningioma.  10/21/14 CSF opening pressure 19cm H2O - (WBC 18, RBC 3545, protein 50, glucose 110, cryptococcal ag negative, IgG index 7.1 (normal), OCB > 5 well defined gamma restriction bands)  10/29/14 JCV ab - negative  08/28/15 JCV ab - negative  08/28/15 JCV AB: negative, 0.40  01/22/16 JCV positive 0.46  04/29/16 JCV 0.44 positive  04/29/16 anti-tysabri ab - POSITIVE     ASSESSMENT AND PLAN  47 y.o. 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 Was on tysabri since Jan 2016 and doing very well, until 2 infusion reactions in May 2017 and Jun 2017, with positive anti-tysabri antibodies. Now on copaxone (generic) since Aug 2017.  Now with subclinical MRI progression in September 2018; now transitioned from Copaxone to [facility] in Nov 2018. Neuro symptoms stable. Has some possible reaction after first dose (med reaction vs anxiety).  Stable on ocrevus.     Dx:  MS (multiple sclerosis) ([place])  Morbid obesity ([place])  Migraine without aura and without status migrainosus, not intractable  Other fatigue  OSA on CPAP  High risk medication use     PLAN:  MULTIPLE SCLEROSIS (established, stable) - continue ocrevus; medically necessary due to symptoms and imaging progression on copaxone - continue exercises and PT - continue vitamin D - monitor CBC, CMP q4months - monitor immunoglobulins - repeat MRI brain in Nov-Dec 2019  OBESITY (established, worsening) - refer to medical weight mgmt; waiting for appt  MIGRAINE HEADACHES (established, stable) - monitor for now  MENINGIOMA (small, asymptomatic) - incidental finding; reassured patient  Orders Placed This Encounter  Procedures  . MR BRAIN W WO CONTRAST  . Immunoglobulins, QN, A/E/G/M  . VITAMIN D 25 Hydroxy (Vit-D Deficiency, Fractures)   Return in about 6 months (around 12/13/2018).    Penni Bombard, MD 7/32/2025, 4:27 AM Certified in Neurology, Neurophysiology and Neuroimaging  [facility] [address] [place],  [address] [phone]  [phone]  

## 2018-06-13 ENCOUNTER — Telehealth: Payer: Self-pay | Admitting: Diagnostic Neuroimaging

## 2018-06-13 NOTE — Telephone Encounter (Signed)
Noted  

## 2018-06-13 NOTE — Telephone Encounter (Signed)
Called and left patient a message to call back and schedule her apt MRI at [facility] that's where I have her approved reference number [id number] Patient has met her out of pocket and and deductible .

## 2018-06-15 ENCOUNTER — Telehealth: Payer: Self-pay | Admitting: *Deleted

## 2018-06-15 LAB — VITAMIN D 25 HYDROXY (VIT D DEFICIENCY, FRACTURES): Vit D, 25-Hydroxy: 13.2 ng/mL — ABNORMAL LOW (ref 30.0–100.0)

## 2018-06-15 LAB — IMMUNOGLOBULINS A/E/G/M, SERUM
IGA/IMMUNOGLOBULIN A, SERUM: 297 mg/dL (ref 87–352)
IGM (IMMUNOGLOBULIN M), SRM: 38 mg/dL (ref 26–217)
IgE (Immunoglobulin E), Serum: 88 IU/mL (ref 6–495)
IgG (Immunoglobin G), Serum: 1059 mg/dL (ref 700–1600)

## 2018-06-15 NOTE — Telephone Encounter (Signed)
LVM informing patient that her Vitamin D is low; advised she restart vitamin D 2000 units per day and follow up with PCP. Informed her that her  Ig panel is good.  Repeated instructions on Vit D, advised our office is closed. Left number for any questions.

## 2018-06-18 NOTE — Telephone Encounter (Signed)
Noted, thank you

## 2018-06-28 ENCOUNTER — Telehealth: Payer: Self-pay | Admitting: Physician Assistant

## 2018-06-28 DIAGNOSIS — E559 Vitamin D deficiency, unspecified: Secondary | ICD-10-CM

## 2018-06-28 MED ORDER — VITAMIN D3 50 MCG (2000 UT) PO CAPS: ORAL_CAPSULE | ORAL | 1 refills | Status: DC

## 2018-06-28 NOTE — Telephone Encounter (Signed)
Copied from [place] [phone]. Topic: Quick Communication - See Telephone Encounter >> Jun 28, 2018  7:21 AM Marja Kays F wrote: Pt is needing to get a restart refill on her vitamin D 200 units she used to be a patient of [facility] but now would like this request to go [employer] number [phone]

## 2018-08-25 ENCOUNTER — Ambulatory Visit (HOSPITAL_COMMUNITY): Payer: 59

## 2018-08-25 ENCOUNTER — Ambulatory Visit (HOSPITAL_COMMUNITY)
Admission: EM | Admit: 2018-08-25 | Discharge: 2018-08-25 | Disposition: A | Discharge status: Home / Self Care | Payer: 59 | Attending: Family Medicine | Admitting: Family Medicine

## 2018-08-25 DIAGNOSIS — S99921A Unspecified injury of right foot, initial encounter: Secondary | ICD-10-CM | POA: Diagnosis not present

## 2018-08-25 NOTE — ED Triage Notes (Signed)
Pt states she fell and hurt her right foot big toe last night .

## 2018-08-25 NOTE — ED Provider Notes (Signed)
[facility]   [id number] 08/25/18 Arrival Time: 3559  ASSESSMENT & PLAN:  1. Injury of right great toe, initial encounter    Declines imaging today.  Prefers trial of post-op shoe and OTC analgesics if needed.  Follow-up Information    [facility].   Specialty:  Urgent Care Why:  As needed. Contact information: [place] [phone]         Discussed importance of close f/u if not seeing improvement over the next several days.  Reviewed expectations re: course of current medical issues. Questions answered. Outlined signs and symptoms indicating need for more acute intervention. Patient verbalized understanding. After Visit Summary given.  SUBJECTIVE: History from: patient. [name] is a 47 y.o. female who reports gradual onset and persistent mild to moderate pain of her right great toe; described as aching without radiation. Injury/trama: yes, reports hitting this toe after tripping. Symptoms have stabilized since beginning. Relieved by: sitting. Worsened by: weight bearing on RLE. Associated symptoms: none reported. Extremity sensation changes or weakness: none. Self treatment: has not tried OTCs for relief of pain. History of similar: no.  Past Surgical History:  Procedure Laterality Date  . CERVICAL ABLATION    . CESAREAN SECTION     3 times  . CHOLECYSTECTOMY    . TUBAL LIGATION       ROS: As per HPI.   OBJECTIVE:  Vitals:   08/25/18 1447  BP: 132/86  Pulse: 88  Resp: (!) 22  Temp: 98.8 F (37.1 C)  SpO2: 100%    General appearance: alert; no distress Extremities: R foot is warm and well perfused; without gross deformities; poorly localized mild tenderness over her right great toe with no swelling and no bruising; affected joint ROM: normal with reported discomfort; right great toenail intact and without subungual hematoma CV: brisk extremity  capillary refill of R great toe Skin: warm and dry Neurologic: normal gait but favors R foot Psychological: alert and cooperative; normal mood and affect  Allergies  Allergen Reactions  . Iron Shortness Of Breath    IV only   . Tysabri [Natalizumab] Other (See Comments)    Shortness of breath, joint pain, tremors  . Delsym [Dextromethorphan Polistirex Er] Other (See Comments)    nightmares    Past Medical History:  Diagnosis Date  . Allergy   . Anemia   . Anxiety   . Clotting disorder ([place])    from beeing anemic  . Depression   . Hypertension   . Iron overload, transfusional   . Migraine   . MS (multiple sclerosis) ([place])   . OSA on CPAP   . Sickle cell anemia (HCC)    trait  . Sickle cell trait ([place])   . Smoker   . Tachycardia    Social History   Socioeconomic History  . Marital status: Divorced    Spouse name: N/A  . Number of children: 3  . Years of education: 12+  . Highest education level: Not on file  Occupational History  . 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, answers calls    Employer: [place]: [employer]  . Financial resource strain: Hard  . Food insecurity:    Worry: Sometimes true    Inability: Never true  . Transportation needs:    Medical: No    Non-medical: No  Tobacco Use  . Smoking status: Current Every Day Smoker  Packs/day: 0.50    Years: 25.00    Pack years: 12.50    Types: Cigarettes    Last attempt to quit: 10/16/2014    Years since quitting: 3.8  . Smokeless tobacco: Never Used  . Tobacco comment: 08/15/17 smoke 2-3 cigarettes/day or prn  Substance and Sexual Activity  . Alcohol use: Yes    Alcohol/week: 0.0 standard drinks    Comment: once maybe a month if that  . Drug use: No  . Sexual activity: Yes    Partners: Male    Birth control/protection: Condom  Lifestyle  . Physical activity:    Days per week: 0 days    Minutes per session: 0 min  . Stress: Rather much    Relationships  . Social connections:    Talks on phone: More than three times a week    Gets together: More than three times a week    Attends religious service: More than 4 times per year    Active member of club or organization: No    Attends meetings of clubs or organizations: Never    Relationship status: Divorced  Other Topics Concern  . Not on file  Social History Narrative   Patient lives at home with her family.   Working at the call center for [employer].    Family History  Problem Relation Age of Onset  . Mental illness Mother   . Hyperlipidemia Mother   . Hyperthyroidism Mother   . Mental retardation Mother   . ADD / ADHD Son   . Diabetes Father   . Cancer Father   . Cancer Paternal Aunt        breast  . Arthritis Maternal Grandmother   . Diabetes Maternal Grandmother   . Hearing loss Maternal Grandmother        left hear when she was a child  . Hyperlipidemia Maternal Grandmother   . Hypertension Maternal Grandmother   . Alcohol abuse Maternal Grandfather   . Early death Maternal Grandfather   . Cancer Paternal Aunt        breast  . Multiple sclerosis Cousin    Past Surgical History:  Procedure Laterality Date  . CERVICAL ABLATION    . CESAREAN SECTION     3 times  . CHOLECYSTECTOMY    . TUBAL LIGATION        Vanessa Kick, MD 09/22/18 1032

## 2018-08-25 NOTE — ED Notes (Signed)
Went to get patient for right great toe x-ray, she stated that she "did not want an x-ray". Reported statement to Dr Mannie Stabile

## 2018-08-25 NOTE — Discharge Instructions (Addendum)
You may use over the counter ibuprofen or acetaminophen as needed.  Please follow up if your toe is not improving over the next week.

## 2018-09-17 ENCOUNTER — Ambulatory Visit (INDEPENDENT_AMBULATORY_CARE_PROVIDER_SITE_OTHER): Payer: 59 | Admitting: Family Medicine

## 2018-09-17 ENCOUNTER — Other Ambulatory Visit: Payer: Self-pay

## 2018-09-17 ENCOUNTER — Telehealth: Payer: Self-pay | Admitting: Diagnostic Neuroimaging

## 2018-09-17 ENCOUNTER — Telehealth: Payer: Self-pay | Admitting: Family Medicine

## 2018-09-17 VITALS — BP 130/83 | HR 91 | Temp 99.3°F | Resp 16 | Ht 63.0 in | Wt 293.2 lb

## 2018-09-17 DIAGNOSIS — M25551 Pain in right hip: Secondary | ICD-10-CM

## 2018-09-17 DIAGNOSIS — R300 Dysuria: Secondary | ICD-10-CM | POA: Diagnosis not present

## 2018-09-17 DIAGNOSIS — G35 Multiple sclerosis: Secondary | ICD-10-CM

## 2018-09-17 LAB — POCT URINALYSIS DIP (MANUAL ENTRY)
Bilirubin, UA: NEGATIVE
Blood, UA: NEGATIVE
Glucose, UA: NEGATIVE mg/dL
Ketones, POC UA: NEGATIVE mg/dL
LEUKOCYTES UA: NEGATIVE
NITRITE UA: NEGATIVE
Protein Ur, POC: NEGATIVE mg/dL
Spec Grav, UA: 1.02 (ref 1.010–1.025)
UROBILINOGEN UA: 0.2 U/dL
pH, UA: 6.5 (ref 5.0–8.0)

## 2018-09-17 NOTE — Telephone Encounter (Signed)
Copied from [place] [id number]. Topic: General - Inquiry >> Sep 17, 2018  2:51 PM Scherrie Gerlach wrote: Reason for CRM: pt spoke with Dr Gladstone Lighter nurse about 12 today and pt is going to hold off getting imaging dne until she hears back from her.

## 2018-09-17 NOTE — Patient Instructions (Addendum)
Call [facility] [phone]  If you have lab work done today you will be contacted with your lab results within the next 2 weeks.  If you have not heard from [place] then please contact us. The fastest way to get your results is to register for My Chart.   IF you received an x-ray today, you will receive an invoice from [facility]. Please contact [facility] at [phone] with questions or concerns regarding your invoice.   IF you received labwork today, you will receive an invoice from [place]. Please contact [facility] at [phone] with questions or concerns regarding your invoice.   Our billing staff will not be able to assist you with questions regarding bills from these companies.  You will be contacted with the lab results as soon as they are available. The fastest way to get your results is to activate your My Chart account. Instructions are located on the last page of this paperwork. If you have not heard from [place] regarding the results in 2 weeks, please contact this office.

## 2018-09-17 NOTE — Telephone Encounter (Signed)
I spoke to pt and she started with body aches last Tuesday, she thought weather changes.  Since then she has had lower leg pain (sharp pains in front of leg), the Friday pains in R upper leg, hip area, only when walking (may holler out) radiates up to hip area.  She saw pcp this am and she ordered hip xray, but pt is thinking it may be MS exacerbation. She takes ocrevus.  Next appt in 12/2018. She has not had any weakness, incontinence, numbness, tingling.  She has not had any trauma recently.  Please advise.

## 2018-09-17 NOTE — Telephone Encounter (Signed)
Pt called stating she has experiencing sharp pains in right leg since Friday 11/1, due to pain she has been out of work since last Tuesday 10/29. Scheduled a f/u with PCP at 9:40am today 11/4. Requesting  A call to advise

## 2018-09-17 NOTE — Progress Notes (Signed)
Chief Complaint  Patient presents with  . right leg pain flare since 09/14/18, intermittent    Per pt pain level 8-10/10 and when pain comes per pt it makes her holler out it is so severe, No pain with sitting and pain levels depend on movement.  . pt would like urine checked for ? uti    HPI  Pt reports that she has been having pain in right hip and only on the right side She states that she gets pain that is severe and about 8-10/10 It makes it difficult to walk She reports that standing is tolerable Sitting is tolerable She reports that she contacted her Neurologist but has not heard back She has MS and her last flare is unknown because she does not know what a flare up is  She denies headaches  Reports some dizziness   Past Medical History:  Diagnosis Date  . Allergy   . Anemia   . Anxiety   . Clotting disorder ([place])    from beeing anemic  . Depression   . Hypertension   . Iron overload, transfusional   . Migraine   . MS (multiple sclerosis) ([place])   . OSA on CPAP   . Sickle cell anemia (H[facility])    trait  . Sickle cell trait ([place])   . Smoker   . Tachycardia     Current Outpatient Medications  Medication Sig Dispense Refill  . ALPRAZolam (XANAX) 0.5 MG tablet Take 1-1.5 tablets (0.5-0.75 mg total) by mouth 2 (two) times daily as needed for anxiety. 60 tablet 0  . Ocrelizumab (OCREVUS IV) Inject into the vein.    Marland Kitchen telmisartan-hydrochlorothiazide (MICARDIS HCT) 80-12.5 MG tablet Take 1 tablet by mouth daily. 90 tablet 3  . Cholecalciferol (VITAMIN D3) 2000 units capsule TAKE 1 CAPSULE (2,000 UNITS TOTAL) BY MOUTH DAILY. (Patient not taking: Reported on 09/17/2018) 90 capsule 1   No current facility-administered medications for this visit.     Allergies:  Allergies  Allergen Reactions  . Iron Shortness Of Breath    IV only   . Tysabri [Natalizumab] Other (See Comments)    Shortness of breath, joint pain, tremors  . Delsym [Dextromethorphan Polistirex Er] Other  (See Comments)    nightmares    Past Surgical History:  Procedure Laterality Date  . CERVICAL ABLATION    . CESAREAN SECTION     3 times  . CHOLECYSTECTOMY    . TUBAL LIGATION      Social History   Socioeconomic History  . Marital status: Divorced    Spouse name: N/A  . Number of children: 3  . Years of education: 12+  . Highest education level: Not on file  Occupational History  . 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, answers calls    Employer: [employer]  . Financial resource strain: Hard  . Food insecurity:    Worry: Sometimes true    Inability: Never true  . Transportation needs:    Medical: No    Non-medical: No  Tobacco Use  . Smoking status: Current Every Day Smoker    Packs/day: 0.50    Years: 25.00    Pack years: 12.50    Types: Cigarettes    Last attempt to quit: 10/16/2014    Years since quitting: 3.9  . Smokeless tobacco: Never Used  . Tobacco comment: 08/15/17 smoke 2-3 cigarettes/day or prn  Substance and Sexual Activity  . Alcohol use: Yes  Alcohol/week: 0.0 standard drinks    Comment: once maybe a month if that  . Drug use: No  . Sexual activity: Yes    Partners: Male    Birth control/protection: Condom  Lifestyle  . Physical activity:    Days per week: 0 days    Minutes per session: 0 min  . Stress: Rather much  Relationships  . Social connections:    Talks on phone: More than three times a week    Gets together: More than three times a week    Attends religious service: More than 4 times per year    Active member of club or organization: No    Attends meetings of clubs or organizations: Never    Relationship status: Divorced  Other Topics Concern  . Not on file  Social History Narrative   Patient lives at home with her family.   Working at the call center for [employer].     Family History  Problem Relation Age of Onset  . Mental illness Mother   . Hyperlipidemia Mother     . Hyperthyroidism Mother   . Mental retardation Mother   . ADD / ADHD Son   . Diabetes Father   . Cancer Father   . Cancer Paternal Aunt        breast  . Arthritis Maternal Grandmother   . Diabetes Maternal Grandmother   . Hearing loss Maternal Grandmother        left hear when she was a child  . Hyperlipidemia Maternal Grandmother   . Hypertension Maternal Grandmother   . Alcohol abuse Maternal Grandfather   . Early death Maternal Grandfather   . Cancer Paternal Aunt        breast  . Multiple sclerosis Cousin      ROS Review of Systems See HPI Constitution: No fevers or chills No malaise No diaphoresis Skin: No rash or itching Eyes: no blurry vision, no double vision GU: no dysuria or hematuria Neuro: no dizziness or headaches all others reviewed and negative   Objective: Vitals:   09/17/18 0955  BP: 130/83  Pulse: 91  Resp: 16  Temp: 99.3 F (37.4 C)  TempSrc: Oral  SpO2: 94%  Weight: 293 lb 3.2 oz (133 kg)  Height: 5\' 3"  (1.6 m)    Physical Exam  Constitutional: She is oriented to person, place, and time. She appears well-developed and well-nourished.  HENT:  Head: Normocephalic and atraumatic.  Eyes: Conjunctivae and EOM are normal.  Pulmonary/Chest: Effort normal.  Neurological: She is alert and oriented to person, place, and time.     Right hip without tenderness on palpation Patient was able to easily swivel on the exam table and could get up unassisted She had not pain with internal or external rotation of the hip No clicking or clunking  UA - no nitrites or LE   Assessment and Plan [name] was seen today for right leg pain flare since 09/14/18, intermittent and pt would like urine checked for ? uti.  Diagnoses and all orders for this visit:  Dysuria- no UTI noted  -     POCT urinalysis dipstick  Acute right hip pain- advised follow up with Neurology -     DG HIP UNILAT W OR W/O PELVIS 2-3 VIEWS RIGHT; Future     Geneva Pallas A Nolon Rod

## 2018-09-18 MED ORDER — PREDNISONE 10 MG PO TABS: ORAL_TABLET | ORAL | 0 refills | Status: DC

## 2018-09-18 NOTE — Addendum Note (Signed)
Addended by: Andrey Spearman R on: 09/18/2018 09:23 AM   Modules accepted: Orders

## 2018-09-18 NOTE — Telephone Encounter (Signed)
Spoke to pt and relayed that per Dr. Leta Baptist ok to start prednisone dose pack ([facility]).  Recommended PT and going ahead with hip xray.  She was upset that did not get taken care of yesterday as she wanted to get back to work today.  I apologized. She will get her medication and start today.  OUTPT PT to order.

## 2018-09-18 NOTE — Telephone Encounter (Signed)
I recommend prednisone pack, PT and hip xray. -VRP

## 2018-09-18 NOTE — Addendum Note (Signed)
Addended by: Brandon Melnick on: 09/18/2018 09:43 AM   Modules accepted: Orders

## 2018-09-19 ENCOUNTER — Telehealth: Payer: Self-pay | Admitting: *Deleted

## 2018-09-19 NOTE — Telephone Encounter (Signed)
I called and spoke to pharmacist at [facility], [phone], Pt was needing refill.  Per Dr. Leta Baptist we do not prescribe this.  Did they mean for it to go to pcp? Pt does not go to Dr. Molli Barrows any longer. They will call pt.

## 2018-09-19 NOTE — Telephone Encounter (Signed)
Treating pt with steroids for possible MS Exacerbation. Received this request for Metronidazole 500mg  1 tablet po BID for 7 days.  Would you do?

## 2018-09-27 DIAGNOSIS — I10 Essential (primary) hypertension: Secondary | ICD-10-CM | POA: Diagnosis not present

## 2018-09-27 DIAGNOSIS — Z23 Encounter for immunization: Secondary | ICD-10-CM | POA: Diagnosis not present

## 2018-09-27 DIAGNOSIS — D573 Sickle-cell trait: Secondary | ICD-10-CM | POA: Diagnosis not present

## 2018-09-27 DIAGNOSIS — Z79899 Other long term (current) drug therapy: Secondary | ICD-10-CM | POA: Diagnosis not present

## 2018-09-27 DIAGNOSIS — G35 Multiple sclerosis: Secondary | ICD-10-CM | POA: Diagnosis not present

## 2018-09-27 DIAGNOSIS — F17200 Nicotine dependence, unspecified, uncomplicated: Secondary | ICD-10-CM | POA: Diagnosis not present

## 2018-09-27 DIAGNOSIS — R7303 Prediabetes: Secondary | ICD-10-CM | POA: Diagnosis not present

## 2018-10-09 ENCOUNTER — Ambulatory Visit: Payer: 59 | Admitting: Diagnostic Neuroimaging

## 2018-10-20 ENCOUNTER — Other Ambulatory Visit: Payer: Self-pay

## 2018-10-20 ENCOUNTER — Encounter: Payer: Self-pay | Admitting: Family Medicine

## 2018-10-20 ENCOUNTER — Ambulatory Visit (INDEPENDENT_AMBULATORY_CARE_PROVIDER_SITE_OTHER): Payer: 59 | Admitting: Family Medicine

## 2018-10-20 VITALS — BP 130/79 | HR 77 | Temp 98.0°F | Resp 16 | Wt 295.0 lb

## 2018-10-20 DIAGNOSIS — R8290 Unspecified abnormal findings in urine: Secondary | ICD-10-CM | POA: Diagnosis not present

## 2018-10-20 DIAGNOSIS — N760 Acute vaginitis: Secondary | ICD-10-CM

## 2018-10-20 DIAGNOSIS — Z716 Tobacco abuse counseling: Secondary | ICD-10-CM

## 2018-10-20 DIAGNOSIS — R350 Frequency of micturition: Secondary | ICD-10-CM | POA: Diagnosis not present

## 2018-10-20 LAB — POCT WET + KOH PREP
Trich by wet prep: ABSENT
Yeast by KOH: ABSENT
Yeast by wet prep: ABSENT

## 2018-10-20 LAB — POCT URINALYSIS DIP (MANUAL ENTRY)
BILIRUBIN UA: NEGATIVE
Glucose, UA: NEGATIVE mg/dL
Ketones, POC UA: NEGATIVE mg/dL
LEUKOCYTES UA: NEGATIVE
NITRITE UA: NEGATIVE
PH UA: 6.5 (ref 5.0–8.0)
Protein Ur, POC: NEGATIVE mg/dL
RBC UA: NEGATIVE
Spec Grav, UA: 1.02 (ref 1.010–1.025)
Urobilinogen, UA: 0.2 E.U./dL

## 2018-10-20 MED ORDER — VARENICLINE TARTRATE 0.5 MG X 11 & 1 MG X 42 PO MISC: ORAL | 0 refills | Status: DC

## 2018-10-20 MED ORDER — METRONIDAZOLE 500 MG PO TABS: 500.0000 mg | ORAL_TABLET | Freq: Two times a day (BID) | ORAL | 0 refills | Status: DC

## 2018-10-20 MED ORDER — VARENICLINE TARTRATE 1 MG PO TABS: 1.0000 mg | ORAL_TABLET | Freq: Two times a day (BID) | ORAL | 2 refills | Status: DC

## 2018-10-20 NOTE — Patient Instructions (Addendum)
If you have lab work done today you will be contacted with your lab results within the next 2 weeks.  If you have not heard from [place] then please contact us. The fastest way to get your results is to register for My Chart.   IF you received an x-ray today, you will receive an invoice from [facility]. Please contact [facility] at [phone] with questions or concerns regarding your invoice.   IF you received labwork today, you will receive an invoice from [place]. Please contact [facility] at [phone] with questions or concerns regarding your invoice.   Our billing staff will not be able to assist you with questions regarding bills from these companies.  You will be contacted with the lab results as soon as they are available. The fastest way to get your results is to activate your My Chart account. Instructions are located on the last page of this paperwork. If you have not heard from [place] regarding the results in 2 weeks, please contact this office.     Your urine culture and other send out labs are pending. Upon receipt of results you will be notified via My Chart.  I am treating you for bacterial vaginosis today.  I am prescribing flagyl 500 mg BID for a total of 7 days.   Bacterial Vaginosis Bacterial vaginosis is an infection of the vagina. It happens when too many germs (bacteria) grow in the vagina. This infection puts you at risk for infections from sex (STIs). Treating this infection can lower your risk for some STIs. You should also treat this if you are pregnant. It can cause your baby to be born early. Follow these instructions at home: Medicines  Take over-the-counter and prescription medicines only as told by your doctor.  Take or use your antibiotic medicine as told by your doctor. Do not stop taking or using it even if you start to feel better. General instructions  If you your sexual partner is a woman, tell her that you have this infection.  She needs to get treatment if she has symptoms. If you have a female partner, he does not need to be treated.  During treatment: ? Avoid sex. ? Do not douche. ? Avoid alcohol as told. ? Avoid breastfeeding as told.  Drink enough fluid to keep your pee (urine) clear or pale yellow.  Keep your vagina and butt (rectum) clean. ? Wash the area with warm water every day. ? Wipe from front to back after you use the toilet.  Keep all follow-up visits as told by your doctor. This is important. Preventing this condition  Do not douche.  Use only warm water to wash around your vagina.  Use protection when you have sex. This includes: ? Latex condoms. ? Dental dams.  Limit how many people you have sex with. It is best to only have sex with the same person (be monogamous).  Get tested for STIs. Have your partner get tested.  Wear underwear that is cotton or lined with cotton.  Avoid tight pants and pantyhose. This is most important in summer.  Do not use any products that have nicotine or tobacco in them. These include cigarettes and e-cigarettes. If you need help quitting, ask your doctor.  Do not use illegal drugs.  Limit how much alcohol you drink. Contact a doctor if:  Your symptoms do not get better, even after you are treated.  You have more discharge or pain when you pee (urinate).  You have a fever.  You have pain in your belly (abdomen).  You have pain with sex.  Your bleed from your vagina between periods. Summary  This infection happens when too many germs (bacteria) grow in the vagina.  Treating this condition can lower your risk for some infections from sex (STIs).  You should also treat this if you are pregnant. It can cause early (premature) birth.  Do not stop taking or using your antibiotic medicine even if you start to feel better. This information is not intended to replace advice given to you by your health care provider. Make sure you discuss any  questions you have with your health care provider. Document Released: 08/09/2008 Document Revised: 07/16/2016 Document Reviewed: 07/16/2016 Elsevier Interactive Patient Education  2017 [employer].

## 2018-10-20 NOTE — Progress Notes (Signed)
Patient ID: [name], female    DOB: [birth year], 47 y.o.   MRN: [id number]  PCP: System, Provider Not In  Chief Complaint  Patient presents with  . Urinary odor  . Vaginal Discharge    with a odor     Subjective:  HPI [name] is a 47 y.o. female presents for evaluation of vaginal discharge and urine frequency. Urine is strong with every urination. No itching or burning in vaginal area. Discharge present. No hematuria. No urine frequency. Concern that symptoms could be related to STD. Same sexual partner, however, would like to rule out STD as a cause of symptoms. Denies CVA tenderness, chills, fever,   Social History   Socioeconomic History  . Marital status: Divorced    Spouse name: N/A  . Number of children: 3  . Years of education: 12+  . Highest education level: Not on file  Occupational History  . 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, answers calls    Employer: [employer]  . Financial resource strain: Hard  . Food insecurity:    Worry: Sometimes true    Inability: Never true  . Transportation needs:    Medical: No    Non-medical: No  Tobacco Use  . Smoking status: Current Every Day Smoker    Packs/day: 0.50    Years: 25.00    Pack years: 12.50    Types: Cigarettes    Last attempt to quit: 10/16/2014    Years since quitting: 4.0  . Smokeless tobacco: Never Used  . Tobacco comment: 08/15/17 smoke 2-3 cigarettes/day or prn  Substance and Sexual Activity  . Alcohol use: Yes    Alcohol/week: 0.0 standard drinks    Comment: once maybe a month if that  . Drug use: No  . Sexual activity: Yes    Partners: Male    Birth control/protection: Condom  Lifestyle  . Physical activity:    Days per week: 0 days    Minutes per session: 0 min  . Stress: Rather much  Relationships  . Social connections:    Talks on phone: More than three times a week    Gets together: More than three times a week    Attends  religious service: More than 4 times per year    Active member of club or organization: No    Attends meetings of clubs or organizations: Never    Relationship status: Divorced  . Intimate partner violence:    Fear of current or ex partner: No    Emotionally abused: No    Physically abused: No    Forced sexual activity: No  Other Topics Concern  . Not on file  Social History Narrative   Patient lives at home with her family.   Working at the call center for [employer].     Family History  Problem Relation Age of Onset  . Mental illness Mother   . Hyperlipidemia Mother   . Hyperthyroidism Mother   . Mental retardation Mother   . ADD / ADHD Son   . Diabetes Father   . Cancer Father   . Cancer Paternal Aunt        breast  . Arthritis Maternal Grandmother   . Diabetes Maternal Grandmother   . Hearing loss Maternal Grandmother        left hear when she was a child  . Hyperlipidemia Maternal Grandmother   . Hypertension Maternal Grandmother   .  Alcohol abuse Maternal Grandfather   . Early death Maternal Grandfather   . Cancer Paternal Aunt        breast  . Multiple sclerosis Cousin      Review of Systems  Pertinent negatives listed in HPI  Patient Active Problem List   Diagnosis Date Noted  . High risk medication use 06/12/2018  . 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 Other fatigue 08/05/2016  . Migraine without aura and without status migrainosus, not intractable 08/05/2016  . Prediabetes   . MS (multiple sclerosis) ([place])   . Obesity, Class III, BMI 40-49.9 (morbid obesity) ([place]) 10/16/2014  . Smoker 10/16/2014  . OSA on CPAP 10/16/2014  . HTN (hypertension) 01/14/2012  . Sickle cell trait ([place]) 01/14/2012    Allergies  Allergen Reactions  . Iron Shortness Of Breath    IV only   . Tysabri [Natalizumab] Other (See Comments)    Shortness of breath, joint pain, tremors  . Delsym [Dextromethorphan Polistirex Er] Other (See Comments)    nightmares    Prior to  Admission medications   Medication Sig Start Date End Date Taking? Authorizing Provider  ALPRAZolam Duanne Moron) 0.5 MG tablet Take 1-1.5 tablets (0.5-0.75 mg total) by mouth 2 (two) times daily as needed for anxiety. 09/08/17  Yes Jeffery, Domingo Mend, PA  Cholecalciferol (VITAMIN D3) 2000 units capsule TAKE 1 CAPSULE (2,000 UNITS TOTAL) BY MOUTH DAILY. 06/28/18  Yes Weber, Damaris Hippo, PA-C  Ocrelizumab (OCREVUS IV) Inject into the vein.   Yes [provider]  predniSONE (DELTASONE) 10 MG tablet Take 60mg  on day 1. Reduce by 10mg  each subsequent day. (60, 50, 40, 30, 20, 10, stop) 09/18/18  Yes Penumalli, Vikram R, MD  telmisartan-hydrochlorothiazide (MICARDIS HCT) 80-12.5 MG tablet Take 1 tablet by mouth daily. 05/19/17  Yes Weber, Damaris Hippo, PA-C    Past Medical, Surgical Family and Social History reviewed and updated.    Objective:   Today's Vitals   10/20/18 0820  BP: 130/79  Pulse: 77  Resp: 16  Temp: 98 F (36.7 C)  TempSrc: Oral  SpO2: 97%  Weight: 295 lb (133.8 kg)    Wt Readings from Last 3 Encounters:  10/20/18 295 lb (133.8 kg)  09/17/18 293 lb 3.2 oz (133 kg)  06/12/18 299 lb 3.2 oz (135.7 kg)   Physical Exam General appearance: alert, well developed, well nourished, cooperative and in no distress Head: Normocephalic, without obvious abnormality, atraumatic Respiratory: Respirations even and unlabored, normal respiratory rate Heart: rate and rhythm normal. No gallop or murmurs noted on exam  Extremities: No gross deformities Skin: Skin color, texture, turgor normal. No rashes seen  Psych: Appropriate mood and affect. Neurologic: Mental status: Alert, oriented to person, place, and time, thought content appropriate. Lab Results  Component Value Date   POCGLU 115 (A) 11/11/2015   POCGLU 94 03/05/2015   POCGLU 128 (A) 11/11/2014    Lab Results  Component Value Date   HGBA1C 5.8 (H) 03/17/2018      Assessment & Plan:  1. Vaginitis and vulvovaginitis, bacterial  vaginosis indicated on wet prep. Start  metroNIDAZOLE (FLAGYL) 500 MG tablet; Take 1 tablet (500 mg total) by mouth 2 (two) times daily with a meal. DO NOT CONSUME ALCOHOL WHILE TAKING THIS MEDICATION.    2. Abnormal urine odor - POCT urinalysis dipstick, negative - Urine Culture, pending  - GC/Chlamydia Probe Amp([facility])  3. Encounter for smoking cessation counseling -Will start chantix starter pack and continue for up ro12 weeks 1 mg twice dialy  Meds ordered this encounter  Medications  . metroNIDAZOLE (FLAGYL) 500 MG tablet    Sig: Take 1 tablet (500 mg total) by mouth 2 (two) times daily with a meal. DO NOT CONSUME ALCOHOL WHILE TAKING THIS MEDICATION.    Dispense:  14 tablet    Refill:  0  . varenicline (CHANTIX STARTING MONTH PAK) 0.5 MG X 11 & 1 MG X 42 tablet    Sig: Take one 0.5 mg tablet by mouth once daily for 3 days, then increase to one 0.5 mg tablet twice daily for 4 days, then increase to one 1 mg tablet twice daily.    Dispense:  53 tablet    Refill:  0  . varenicline (CHANTIX) 1 MG tablet    Sig: Take 1 tablet (1 mg total) by mouth 2 (two) times daily.    Dispense:  30 tablet    Refill:  2     -The patient was given clear instructions to go to ER or return to medical center if symptoms do not improve, worsen or new problems develop. The patient verbalized understanding.     Carroll Sage. Kenton Kingfisher, FNP-C Nurse Practitioner (PRN Staff)  Primary Care at [facility] [address] [place], [place]

## 2018-10-21 LAB — URINE CULTURE: Organism ID, Bacteria: NO GROWTH

## 2018-10-23 LAB — GC/CHLAMYDIA PROBE AMP
CHLAMYDIA, DNA PROBE: NEGATIVE
Neisseria gonorrhoeae by PCR: NEGATIVE

## 2018-11-02 ENCOUNTER — Encounter: Payer: Self-pay | Admitting: Family Medicine

## 2018-11-05 ENCOUNTER — Other Ambulatory Visit: Payer: Self-pay | Admitting: Family Medicine

## 2018-11-05 DIAGNOSIS — N760 Acute vaginitis: Secondary | ICD-10-CM

## 2018-11-05 MED ORDER — METRONIDAZOLE 500 MG PO TABS: 500.0000 mg | ORAL_TABLET | Freq: Two times a day (BID) | ORAL | 1 refills | Status: DC

## 2018-11-05 NOTE — Progress Notes (Signed)
Refilled flagyl with 1 refill

## 2018-11-09 ENCOUNTER — Other Ambulatory Visit: Payer: Self-pay | Admitting: Family Medicine

## 2018-11-09 DIAGNOSIS — N760 Acute vaginitis: Secondary | ICD-10-CM

## 2018-11-11 NOTE — Telephone Encounter (Signed)
Refilled approved one time

## 2018-12-12 ENCOUNTER — Telehealth: Payer: Self-pay | Admitting: Diagnostic Neuroimaging

## 2018-12-12 DIAGNOSIS — G35 Multiple sclerosis: Secondary | ICD-10-CM | POA: Diagnosis not present

## 2018-12-12 NOTE — Telephone Encounter (Signed)
pt has called for the intrafusion suite, call transferred °

## 2018-12-25 ENCOUNTER — Ambulatory Visit (INDEPENDENT_AMBULATORY_CARE_PROVIDER_SITE_OTHER): Payer: 59 | Admitting: Diagnostic Neuroimaging

## 2018-12-25 ENCOUNTER — Encounter: Payer: Self-pay | Admitting: Diagnostic Neuroimaging

## 2018-12-25 VITALS — BP 133/84 | HR 102 | Ht 63.0 in | Wt 298.2 lb

## 2018-12-25 DIAGNOSIS — G35 Multiple sclerosis: Secondary | ICD-10-CM | POA: Diagnosis not present

## 2018-12-25 DIAGNOSIS — Z79899 Other long term (current) drug therapy: Secondary | ICD-10-CM | POA: Diagnosis not present

## 2018-12-25 DIAGNOSIS — R7303 Prediabetes: Secondary | ICD-10-CM | POA: Diagnosis not present

## 2018-12-25 DIAGNOSIS — Z87891 Personal history of nicotine dependence: Secondary | ICD-10-CM | POA: Diagnosis not present

## 2018-12-25 DIAGNOSIS — I10 Essential (primary) hypertension: Secondary | ICD-10-CM | POA: Diagnosis not present

## 2018-12-25 DIAGNOSIS — D573 Sickle-cell trait: Secondary | ICD-10-CM | POA: Diagnosis not present

## 2018-12-25 DIAGNOSIS — M79605 Pain in left leg: Secondary | ICD-10-CM | POA: Diagnosis not present

## 2018-12-25 DIAGNOSIS — Z6841 Body Mass Index (BMI) 40.0 and over, adult: Secondary | ICD-10-CM | POA: Diagnosis not present

## 2018-12-25 MED ORDER — GABAPENTIN 300 MG PO CAPS: 300.0000 mg | ORAL_CAPSULE | Freq: Three times a day (TID) | ORAL | 11 refills | Status: DC

## 2018-12-25 NOTE — Progress Notes (Signed)
[facility]  PATIENT: [name] DOB: 1971/04/12  REFERRING CLINICIAN:  HISTORY FROM: patient REASON FOR VISIT: follow up   HISTORICAL  CHIEF COMPLAINT:  Chief Complaint  Patient presents with  . Multiple Sclerosis    rm 7, Last Ocrevus 12/12/18, "Left leg pain/numbness/tingling since 12/10/18, 1 week of prednisone, Tylenol, Ibuprofen, Tramadol, heat- nothing is helping"  . Follow-up    6 month    HISTORY OF PRESENT ILLNESS:   UPDATE (03/26/18, VRP): Since last visit, doing well. Tolerating ocrevus, although she had some SOB ~ 4 hours after 1st infusion. Went to ER and symptoms resolved. After 2nd dose, has similar but milder symptoms. No alleviating or aggravating factors. Other neuro symptoms are stable to improved.  UPDATE (08/15/17, VRP): Since last visit, Had MRI of the brain which showed new chronic demyelinating plaques. Also one left temporal lesion had slight contrast enhancement. Patient was asked to come in today to discuss medication options. Patient has had some cognitive foggy sensation since hearing of knees that her MRI had progressed. Patient thinks this is related to her worrying about her MS progression. No new numbness or tingling. She has noticed her gait and balance is slightly off.  UPDATE (08/02/17, VRP): Since last visit, doing well. Tolerating copaxone. No alleviating or aggravating factors. No more headaches. Fatigue continues. Body aches are mild. Working 40+ hours per week.  UPDATE 01/06/17: Since last visit, MS sxs stable. HA resolved, so stopped TPX. Only using rizatriptan as needed. Overall doing well. Some work related stress. Tolerating copaxone generic. Some injection site reactions in legs, so she is only injecting in arms/stomach/hips, and doing well.   UPDATE 08/05/16: Since last visit, has transitioned to copaxone since Aug 2017. MRI brain reviewed. Having some intermittent HA, esp with weather changes. HA --> bitemporal,  retro-orbital, aching, + photophobia, - phonophobia, -N/V; last for 1-2 days; happening 1 per week. These HA have been present since 2014. Never been on preventative meds. Has tried maxalt in 2014 with some benefit.  UPDATE 04/29/16: Since last visit, had possible infusion reaction on 03/30/16 here at infusion center (shaking, chills, malaise), in setting of URI and recently starting z-pack. Given benedryl and solumedrol, then went to ER for observation. Had full recovery. This past week, had tysabri infusion last Wednesday (04/27/16) and was doing well initially, until she developed joint pain (knees, shoulder). Then infusion stopped. Then patient returned home, then developed shaking chills, and went to urgent care for observation. Now doing well. Here to discuss other MS med options.  UPDATE 01/22/16: Since last visit, doing well. Some fatigue on busy work days (Mon and Fri). No new neuro sxs. Tolerating tysabri. Some joint aches pains in arms and wrists.   UPDATE 09/07/15: Since last visit, doing well. Tolerating tysabri. No new events neurologically. Some more leg swelling lately, esp with sitting for long time at work. 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  UPDATE 06/02/15: Doing well. Strength good. No numbness. Fatigue better. Concentration is better. Intermittent word finding diff (scanning speech). Tolerating tysabri infusions. 3 weeks ago noted fine small bumps on arms, mild itching. Some int headaches in temporal regions, mild.   UPDATE 03/03/15: Since last visit, more right hip and low back pain. Intermittent. Had tysabri on March 14 and Apr 11. Overall c/o fatigue, focusing issues. 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  UPDATE 12/01/14: Since last visit,  left side weakness has improved. Now having more problems with right knee pain, esp walking longer distances. Awaiting tysabri scheduling.   PRIOR HPI (10/29/14): 48 year old  right-handed female here for evaluation of multiple sclerosis. 10/15/14 patient developed staggering to the left side. The next day she noted left arm and left leg heaviness and weakness. Patient was in the hospital and had evaluation. MRI of the brain demonstrated multiple brain lesions suspicious for demyelinating disease. Patient was treated with IV steroids and her left sided symptoms have almost completely resolved. Patient continues to have a slight limp with left leg difficulty. Also has some slight incoordination with left hand. In the past patient has had intermittent headaches. Patient has also had intermittent episodes of knee pain and intermittent vertigo attacks. Patient has family history of multiple sclerosis in maternal uncle and maternal cousin.    REVIEW OF SYSTEMS: Full 14 system review of systems performed and negative except: as per HPI.    ALLERGIES: Allergies  Allergen Reactions  . Iron Shortness Of Breath    IV only   . Tysabri [Natalizumab] Other (See Comments)    Shortness of breath, joint pain, tremors  . Delsym [Dextromethorphan Polistirex Er] Other (See Comments)    nightmares    HOME MEDICATIONS: Outpatient Medications Prior to Visit  Medication Sig Dispense Refill  . ALPRAZolam (XANAX) 0.5 MG tablet Take 1-1.5 tablets (0.5-0.75 mg total) by mouth 2 (two) times daily as needed for anxiety. 60 tablet 0  . Cholecalciferol (VITAMIN D3) 2000 units capsule TAKE 1 CAPSULE (2,000 UNITS TOTAL) BY MOUTH DAILY. 90 capsule 1  . Ocrelizumab (OCREVUS IV) Inject into the vein.    Marland Kitchen telmisartan-hydrochlorothiazide (MICARDIS HCT) 80-12.5 MG tablet Take 1 tablet by mouth daily. 90 tablet 3  . varenicline (CHANTIX STARTING MONTH PAK) 0.5 MG X 11 & 1 MG X 42 tablet Take one 0.5 mg tablet by mouth once daily for 3 days, then increase to one 0.5 mg tablet twice daily for 4 days, then increase to one 1 mg tablet twice daily. 53 tablet 0  . metroNIDAZOLE (FLAGYL) 500 MG tablet TAKE 1  TABLET BY MOUTH 2 TIMES DAILY WITH A MEAL. DO NOT CONSUME ALCOHOL WHILE TAKING THIS. 14 tablet 0  . predniSONE (DELTASONE) 10 MG tablet Take 60mg  on day 1. Reduce by 10mg  each subsequent day. (60, 50, 40, 30, 20, 10, stop) 21 tablet 0  . varenicline (CHANTIX) 1 MG tablet Take 1 tablet (1 mg total) by mouth 2 (two) times daily. (Patient not taking: Reported on 12/25/2018) 30 tablet 2   No facility-administered medications prior to visit.     PAST MEDICAL HISTORY: Past Medical History:  Diagnosis Date  . Allergy   . Anemia   . Anxiety   . Clotting disorder ([place])    from beeing anemic  . Depression   . Hypertension   . Iron overload, transfusional   . Migraine   . MS (multiple sclerosis) ([place])   . OSA on CPAP   . Sickle cell anemia ([facility])    trait  . Sickle cell trait ([place])   . Smoker   . Tachycardia     PAST SURGICAL HISTORY: Past Surgical History:  Procedure Laterality Date  . CERVICAL ABLATION    . CESAREAN SECTION     3 times  . CHOLECYSTECTOMY    . TUBAL LIGATION      FAMILY HISTORY: Family History  Problem Relation Age of Onset  . Mental illness Mother   .  Hyperlipidemia Mother   . Hyperthyroidism Mother   . Mental retardation Mother   . ADD / ADHD Son   . Diabetes Father   . Cancer Father   . Cancer Paternal Aunt        breast  . Arthritis Maternal Grandmother   . Diabetes Maternal Grandmother   . Hearing loss Maternal Grandmother        left hear when she was a child  . Hyperlipidemia Maternal Grandmother   . Hypertension Maternal Grandmother   . Alcohol abuse Maternal Grandfather   . Early death Maternal Grandfather   . Cancer Paternal Aunt        breast  . Multiple sclerosis Cousin     SOCIAL HISTORY:  Social History   Socioeconomic History  . Marital status: Divorced    Spouse name: N/A  . Number of children: 3  . Years of education: 12+  . Highest education level: Not on file  Occupational History  . Occupation: [employer], answers calls    Employer: [employer]  . Financial resource strain: Hard  . Food insecurity:    Worry: Sometimes true    Inability: Never true  . Transportation needs:    Medical: No    Non-medical: No  Tobacco Use  . Smoking status: Former Smoker    Packs/day: 0.50    Years: 25.00    Pack years: 12.50    Types: Cigarettes    Last attempt to quit: 09/16/2018    Years since quitting: 0.2  . Smokeless tobacco: Never Used  . Tobacco comment: 12/25/18  quit 2 months ago  Substance and Sexual Activity  . Alcohol use: Yes    Alcohol/week: 0.0 standard drinks    Comment: once maybe a month if that  . Drug use: No  . Sexual activity: Yes    Partners: Male    Birth control/protection: Condom  Lifestyle  . Physical activity:    Days per week: 0 days    Minutes per session: 0 min  . Stress: Rather much  Relationships  . Social connections:    Talks on phone: More than three times a week    Gets together: More than three times a week    Attends religious service: More than 4 times per year    Active member of club or organization: No    Attends meetings of clubs or organizations: Never    Relationship status: Divorced  . Intimate partner violence:    Fear of current or ex partner: No    Emotionally abused: No    Physically abused: No    Forced sexual activity: No  Other Topics Concern  . Not on file  Social History Narrative   Patient lives at home with her family.   Working at the call center for [employer].      PHYSICAL EXAM  GENERAL EXAM/CONSTITUTIONAL: Vitals:  Vitals:   12/25/18 0808  BP: 133/84  Pulse: (!) 102  Weight: 298 lb 3.2 oz (135.3 kg)  Height: 5\' 3"  (1.6 m)   Body mass index is 52.82 kg/m. Wt Readings from Last 10 Encounters:  12/25/18 298 lb 3.2 oz (135.3 kg)  10/20/18 295 lb (133.8 kg)  09/17/18 293 lb 3.2 oz (133 kg)  06/12/18 299 lb 3.2 oz (135.7 kg)  03/26/18 299 lb 6.4 oz  (135.8 kg)  03/17/18 290 lb (131.5 kg)  12/27/17 291 lb (132 kg)  09/19/17 289 lb 3.2 oz (131.2 kg)  08/15/17 287 lb (130.2 kg)  08/03/17 287 lb (130.2 kg)    Patient is in no distress; well developed, nourished and groomed; neck is supple  CARDIOVASCULAR:  Examination of carotid arteries is normal; no carotid bruits  Regular rate and rhythm, no murmurs  Examination of peripheral vascular system by observation and palpation is normal  EYES:  Ophthalmoscopic exam of optic discs and posterior segments is normal; no papilledema or hemorrhages  Visual Acuity Screening   Right eye Left eye Both eyes  Without correction: 20/30 20/40   With correction:       MUSCULOSKELETAL:  Gait, strength, tone, movements noted in Neurologic exam below  NEUROLOGIC: MENTAL STATUS:  No flowsheet data found.  awake, alert, oriented to person, place and time  recent and remote memory intact  normal attention and concentration  language fluent, comprehension intact, naming intact  fund of knowledge appropriate  CRANIAL NERVE:   2nd - no papilledema on fundoscopic exam  2nd, 3rd, 4th, 6th - pupils equal and reactive to light, visual fields full to confrontation, extraocular muscles intact, no nystagmus  5th - facial sensation symmetric  7th - facial strength symmetric  8th - hearing intact  9th - palate elevates symmetrically, uvula midline  11th - shoulder shrug symmetric  12th - tongue protrusion midline  MOTOR:   normal bulk and tone, full strength in the BUE, BLE; LIMITED IN LEFT LEG  SENSORY:   normal and symmetric to light touch  COORDINATION:   finger-nose-finger, fine finger movements normal  REFLEXES:   deep tendon reflexes TRACE and symmetric  GAIT/STATION:   narrow based gait       DIAGNOSTIC DATA (LABS, IMAGING, TESTING) - I reviewed patient records, labs, notes, testing and imaging myself where available.  Lab Results  Component Value Date     WBC 8.5 03/17/2018   HGB 10.7 (L) 03/17/2018   HCT 35.7 03/17/2018   MCV 70 (L) 03/17/2018   PLT 371 03/17/2018      Component Value Date/Time   NA 142 03/17/2018 1405   K 4.5 03/17/2018 1405   CL 107 (H) 03/17/2018 1405   CO2 21 03/17/2018 1405   GLUCOSE 80 03/17/2018 1405   GLUCOSE 97 09/19/2017 1314   BUN 8 03/17/2018 1405   CREATININE 0.80 03/17/2018 1405   CREATININE 0.75 11/11/2015 0828   CALCIUM 10.8 (H) 03/17/2018 1405   PROT 6.9 03/17/2018 1405   ALBUMIN 4.1 03/17/2018 1405   AST 14 03/17/2018 1405   ALT 15 03/17/2018 1405   ALKPHOS 92 03/17/2018 1405   BILITOT 0.2 03/17/2018 1405   GFRNONAA 87 03/17/2018 1405   GFRNONAA >89 11/11/2015 0828   GFRAA 101 03/17/2018 1405   GFRAA >89 11/11/2015 0828   Lab Results  Component Value Date   CHOL 168 03/17/2018   HDL 44 03/17/2018   LDLCALC 107 (H) 03/17/2018   TRIG 84 03/17/2018   CHOLHDL 3.8 03/17/2018   Lab Results  Component Value Date   HGBA1C 5.8 (H) 03/17/2018   No results found for: [id number] Lab Results  Component Value Date   TSH 0.985 03/17/2018   Vit D, 25-Hydroxy  Date Value Ref Range Status  06/12/2018 13.2 (L) 30.0 - 100.0 ng/mL Final    Comment:    Vitamin D deficiency has been defined by the Institute of Medicine and an Endocrine Society practice guideline as a level of serum 25-OH vitamin D less than 20 ng/mL (1,2).  The Endocrine Society went on to further define vitamin D insufficiency as a level between 21 and 29 ng/mL (2). 1. IOM (Institute of Medicine). 2010. Dietary reference    intakes for calcium and D. [place]: The    [employer]. 2. Holick MF, Binkley Morrison, Bischoff-Ferrari HA, et al.    Evaluation, treatment, and prevention of vitamin D    deficiency: an Endocrine Society clinical practice    guideline. JCEM. 2011 Jul; 96(7):1911-30.     10/17/14 MRI brain (without)  1. Multiple T2/FLAIR hyperintense lesions with associated restricted diffusion involving  the white matter of both cerebral hemispheres, right greater than left, most consistent with active demyelinating disease. 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 Follow-up examination with postcontrast imaging could be performed for complete evaluation. 2. No other findings to suggest acute intracranial infarct or other process.  10/17/14 MRI cervical spine (with and without)  - Motion degraded exam demonstrating no convincing evidence for spinal MS.  Certainly there are no areas of T2 hyperintensity or cord enlargement. No disc protrusion or spinal stenosis.  10/18/14 MRI brain (with) - Findings consistent with acute demyelination in the RIGHT posterior frontal subcortical white matter in this patient with suspected multiple sclerosis. Multiple other areas of T2 signal hyperintensity on the previous study do not display similar postcontrast enhancement. 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  07/08/15 MRI brain [I reviewed images myself and agree with interpretation. -VRP]  1. Regression of multiple sclerosis since December 2015. No active demyelination. 2. Tiny 5 mm right posterior convexity meningioma versus benign dural thickening. This is stable and appears inconsequential.  07/09/16 MRI brain [I reviewed images myself and agree with interpretation. -VRP]  - Progression of white matter hyperintensity in the right temporoparietal lobe consistent with progressive demyelinating disease. New area of demyelination in the left posterior frontal lobe is small measuring 5 mm. No evidence of acute demyelinization. - Stable small posterior right parietal meningioma.  08/08/17 MRI brain [I reviewed images myself and agree  with interpretation. -VRP]  1. Multiple sclerosis with 5 new or larger plaques in the cerebral white matter when compared to 07/09/2016. One of these, in the left temporal lobe, is enhancing. 2. Stable normal brain volume. 3. Stable 6 mm right parietal meningioma.  10/21/14 CSF opening pressure 19cm H2O - (WBC 18, RBC 3545, protein 50, glucose 110, cryptococcal ag negative, IgG index 7.1 (normal), OCB > 5 well defined gamma restriction bands)  10/29/14 JCV ab - negative  08/28/15 JCV ab - negative  08/28/15 JCV AB: negative, 0.40  01/22/16 JCV positive 0.46  04/29/16 JCV 0.44 positive  04/29/16 anti-tysabri ab - POSITIVE     ASSESSMENT AND PLAN  48 y.o. 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 Was on tysabri since Jan 2016 and doing very well, until 2 infusion reactions in May 2017 and Jun 2017, with positive anti-tysabri antibodies. Now on copaxone (generic) since Aug 2017.  Now with subclinical MRI progression in September 2018; now transitioned from Copaxone to [facility] in Nov 2018. Neuro symptoms stable. Has some possible reaction after first dose ocrevus (mild SOB; ? med reaction vs  anxiety).  Dx:  MS (multiple sclerosis) ([place]) - Plan: MR BRAIN W WO CONTRAST  Left leg pain - Plan: MR BRAIN W WO CONTRAST, MR LUMBAR SPINE WO CONTRAST     PLAN:  MULTIPLE SCLEROSIS (established, worsening) - continue ocrevus; medically necessary due to symptoms and imaging progression on copaxone - continue exercises and PT - continue vitamin D - monitor CBC, CMP q87months - monitor immunoglobulins - check MRI brain  LEFT LEG PAIN (new problem; worsening - start gabapentin 300mg  at bedtime; may increase to twice a day or three times a day  - check MRI lumbar spine - refer to pain mgmt  clinic  OBESITY (established, not improving) - referred to medical weight mgmt; patient will discuss with PCP  MIGRAINE HEADACHES (established, stable) - monitor for now  MENINGIOMA (small, asymptomatic) - incidental finding; reassured patient  Orders Placed This Encounter  Procedures  . MR BRAIN W WO CONTRAST  . MR LUMBAR SPINE WO CONTRAST   Meds ordered this encounter  Medications  . gabapentin (NEURONTIN) 300 MG capsule    Sig: Take 1 capsule (300 mg total) by mouth 3 (three) times daily.    Dispense:  90 capsule    Refill:  11   Return in about 4 months (around 04/25/2019).    Penni Bombard, MD 0/08/2724, 3:66 AM Certified in Neurology, Neurophysiology and Neuroimaging  [facility] [address], [place], [place] [address] [phone]

## 2018-12-25 NOTE — Patient Instructions (Signed)
MULTIPLE SCLEROSIS (established, worsening) - continue ocrevus - continue vitamin D - check MRI brain  LEFT LEG PAIN (new problem; worsening) - start gabapentin 300mg  at bedtime; may increase to twice a day or three times a day  - check MRI lumbar spine  OBESITY (established, not improving) - referred to medical weight mgmt; patient will discuss with PCP

## 2018-12-26 ENCOUNTER — Telehealth: Payer: Self-pay | Admitting: *Deleted

## 2018-12-26 ENCOUNTER — Telehealth: Payer: Self-pay | Admitting: Diagnostic Neuroimaging

## 2018-12-26 DIAGNOSIS — G35 Multiple sclerosis: Secondary | ICD-10-CM

## 2018-12-26 NOTE — Telephone Encounter (Signed)
Received fax from [facility] regarding Adverse Event Form : regarding shortness of breath and Ocrevus.  Form completed and faxed to both fax # with confirmation received.

## 2018-12-26 NOTE — Telephone Encounter (Signed)
Orders placed. -VRP

## 2018-12-26 NOTE — Telephone Encounter (Signed)
[facility] um[facility] auth: npr patient requested them to be done at [facility] long on the a Saturday. She is scheduled for Saturday 01/05/19 to arrive at 8:30 AM. But before I call her to inform her of this she will need recent lab work done. When you get a chance can you put the orders in and I will inform the patient she will need to come by here to get her labs done by next Saturday.

## 2018-12-26 NOTE — Telephone Encounter (Signed)
Spoke to the patient and informed her of her MRI appt. Also, informed her that she will have to come up to the office to get blood work done before that appt. She stated she will.

## 2018-12-27 MED ORDER — PREDNISONE 10 MG PO TABS: ORAL_TABLET | ORAL | 0 refills | Status: DC

## 2018-12-27 NOTE — Telephone Encounter (Signed)
Meds ordered this encounter  Medications  . predniSONE (DELTASONE) 10 MG tablet    Sig: Take 60mg  on day 1. Reduce by 10mg  each subsequent day. (60, 50, 40, 30, 20, 10, stop)    Dispense:  21 tablet    Refill:  0    Penni Bombard, MD 2/56/3893, 73:42 AM Certified in Neurology, Neurophysiology and Neuroimaging  [facility] [address] [place], [place] [address] [phone]

## 2019-01-01 ENCOUNTER — Other Ambulatory Visit: Payer: Self-pay | Admitting: *Deleted

## 2019-01-01 ENCOUNTER — Other Ambulatory Visit (INDEPENDENT_AMBULATORY_CARE_PROVIDER_SITE_OTHER): Payer: Self-pay

## 2019-01-01 DIAGNOSIS — Z0289 Encounter for other administrative examinations: Secondary | ICD-10-CM

## 2019-01-01 DIAGNOSIS — G35 Multiple sclerosis: Secondary | ICD-10-CM | POA: Diagnosis not present

## 2019-01-02 LAB — CBC WITH DIFFERENTIAL/PLATELET
BASOS ABS: 0.1 10*3/uL (ref 0.0–0.2)
Basos: 0 %
EOS (ABSOLUTE): 0.1 10*3/uL (ref 0.0–0.4)
Eos: 1 %
Hematocrit: 32.6 % — ABNORMAL LOW (ref 34.0–46.6)
Hemoglobin: 9.4 g/dL — ABNORMAL LOW (ref 11.1–15.9)
Immature Grans (Abs): 0.1 10*3/uL (ref 0.0–0.1)
Immature Granulocytes: 1 %
Lymphocytes Absolute: 3.1 10*3/uL (ref 0.7–3.1)
Lymphs: 18 %
MCH: 19.2 pg — ABNORMAL LOW (ref 26.6–33.0)
MCHC: 28.8 g/dL — ABNORMAL LOW (ref 31.5–35.7)
MCV: 67 fL — ABNORMAL LOW (ref 79–97)
Monocytes Absolute: 1.5 10*3/uL — ABNORMAL HIGH (ref 0.1–0.9)
Monocytes: 9 %
Neutrophils Absolute: 12 10*3/uL — ABNORMAL HIGH (ref 1.4–7.0)
Neutrophils: 71 %
PLATELETS: 356 10*3/uL (ref 150–450)
RBC: 4.89 x10E6/uL (ref 3.77–5.28)
RDW: 19.9 % — ABNORMAL HIGH (ref 11.7–15.4)
WBC: 16.8 10*3/uL — ABNORMAL HIGH (ref 3.4–10.8)

## 2019-01-02 LAB — COMPREHENSIVE METABOLIC PANEL
ALT: 14 IU/L (ref 0–32)
AST: 8 IU/L (ref 0–40)
Albumin/Globulin Ratio: 1.6 (ref 1.2–2.2)
Albumin: 4 g/dL (ref 3.8–4.8)
Alkaline Phosphatase: 96 IU/L (ref 39–117)
BUN/Creatinine Ratio: 16 (ref 9–23)
BUN: 14 mg/dL (ref 6–24)
Bilirubin Total: <0.2 mg/dL (ref 0.0–1.2)
CHLORIDE: 103 mmol/L (ref 96–106)
CO2: 23 mmol/L (ref 20–29)
Calcium: 10.7 mg/dL — ABNORMAL HIGH (ref 8.7–10.2)
Creatinine, Ser: 0.87 mg/dL (ref 0.57–1.00)
GFR calc Af Amer: 90 mL/min/{1.73_m2} (ref >59)
GFR calc non Af Amer: 78 mL/min/{1.73_m2} (ref >59)
Globulin, Total: 2.5 g/dL (ref 1.5–4.5)
Glucose: 91 mg/dL (ref 65–99)
Potassium: 4.8 mmol/L (ref 3.5–5.2)
Sodium: 141 mmol/L (ref 134–144)
Total Protein: 6.5 g/dL (ref 6.0–8.5)

## 2019-01-04 DIAGNOSIS — M5416 Radiculopathy, lumbar region: Secondary | ICD-10-CM | POA: Diagnosis not present

## 2019-01-04 DIAGNOSIS — R600 Localized edema: Secondary | ICD-10-CM | POA: Diagnosis not present

## 2019-01-04 DIAGNOSIS — R350 Frequency of micturition: Secondary | ICD-10-CM | POA: Diagnosis not present

## 2019-01-05 ENCOUNTER — Ambulatory Visit (HOSPITAL_COMMUNITY)
Admission: RE | Admit: 2019-01-05 | Discharge: 2019-01-05 | Disposition: A | Discharge status: Home / Self Care | Payer: 59 | Source: Ambulatory Visit | Attending: Diagnostic Neuroimaging | Admitting: Diagnostic Neuroimaging

## 2019-01-05 ENCOUNTER — Ambulatory Visit (HOSPITAL_COMMUNITY): Payer: 59

## 2019-01-05 DIAGNOSIS — M79605 Pain in left leg: Secondary | ICD-10-CM

## 2019-01-06 ENCOUNTER — Emergency Department (HOSPITAL_BASED_OUTPATIENT_CLINIC_OR_DEPARTMENT_OTHER)
Admission: EM | Admit: 2019-01-06 | Discharge: 2019-01-06 | Disposition: A | Discharge status: Home / Self Care | Payer: 59 | Attending: Emergency Medicine | Admitting: Emergency Medicine

## 2019-01-06 ENCOUNTER — Other Ambulatory Visit: Payer: Self-pay

## 2019-01-06 ENCOUNTER — Encounter (HOSPITAL_BASED_OUTPATIENT_CLINIC_OR_DEPARTMENT_OTHER): Payer: Self-pay | Admitting: Emergency Medicine

## 2019-01-06 DIAGNOSIS — R7303 Prediabetes: Secondary | ICD-10-CM | POA: Diagnosis not present

## 2019-01-06 DIAGNOSIS — Z87891 Personal history of nicotine dependence: Secondary | ICD-10-CM | POA: Insufficient documentation

## 2019-01-06 DIAGNOSIS — Z79899 Other long term (current) drug therapy: Secondary | ICD-10-CM | POA: Insufficient documentation

## 2019-01-06 DIAGNOSIS — M5432 Sciatica, left side: Secondary | ICD-10-CM | POA: Diagnosis not present

## 2019-01-06 DIAGNOSIS — M79605 Pain in left leg: Secondary | ICD-10-CM | POA: Diagnosis not present

## 2019-01-06 DIAGNOSIS — M545 Low back pain: Secondary | ICD-10-CM | POA: Diagnosis present

## 2019-01-06 DIAGNOSIS — I10 Essential (primary) hypertension: Secondary | ICD-10-CM | POA: Diagnosis not present

## 2019-01-06 MED ORDER — LIDOCAINE 5 % EX PTCH: 1.0000 | MEDICATED_PATCH | CUTANEOUS | 0 refills | Status: DC

## 2019-01-06 MED ORDER — KETOROLAC TROMETHAMINE 30 MG/ML IJ SOLN
30.0000 mg | Freq: Once | INTRAMUSCULAR | Status: AC
  Administered 2019-01-06: 30 mg via INTRAMUSCULAR
  Filled 2019-01-06: qty 1

## 2019-01-06 NOTE — ED Triage Notes (Signed)
L leg pain for 5 weeks. Has been seen by neurology and is scheduled for MRI. States PCP told her to come to ED for pain control.

## 2019-01-06 NOTE — ED Provider Notes (Signed)
[facility] DEPARTMENT Provider Note   CSN: [id number] Arrival date & time: 01/06/19  7829    History   Chief Complaint Chief Complaint  Patient presents with  . Leg Pain    HPI [name] is a 48 y.o. female.     HPI Patient presents with left lower back pain going down to the leg.  Has been seen by PCP and neurology.  Thought to be a sciatica.  Has been on prednisone Neurontin and pain medicines.  Continued pain.  Has an MRI scheduled for tomorrow.  No weakness.  Worse with movements.  No relief with Norco.  No fevers.  No trauma.  No abdominal pain.  No rash. Past Medical History:  Diagnosis Date  . Allergy   . Anemia   . Anxiety   . Clotting disorder ([place])    from beeing anemic  . Depression   . Hypertension   . Iron overload, transfusional   . Migraine   . MS (multiple sclerosis) ([place])   . OSA on CPAP   . Sickle cell anemia (H[facility])    trait  . Sickle cell trait ([place])   . Smoker   . Tachycardia     Patient Active Problem List   Diagnosis Date Noted  . High risk medication use 06/12/2018  . 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 Other fatigue 08/05/2016  . Migraine without aura and without status migrainosus, not intractable 08/05/2016  . Prediabetes   . MS (multiple sclerosis) ([place])   . Obesity, Class III, BMI 40-49.9 (morbid obesity) ([place]) 10/16/2014  . Smoker 10/16/2014  . OSA on CPAP 10/16/2014  . HTN (hypertension) 01/14/2012  . Sickle cell trait ([place]) 01/14/2012    Past Surgical History:  Procedure Laterality Date  . CERVICAL ABLATION    . CESAREAN SECTION     3 times  . CHOLECYSTECTOMY    . TUBAL LIGATION       OB History   No obstetric history on file.      Home Medications    Prior to Admission medications   Medication Sig Start Date End Date Taking? Authorizing Provider  ALPRAZolam Duanne Moron) 0.5 MG tablet Take 1-1.5 tablets (0.5-0.75 mg total) by mouth 2 (two) times daily as needed for anxiety. 09/08/17   Harrison Mons, PA  Cholecalciferol (VITAMIN D3) 2000 units capsule TAKE 1 CAPSULE (2,000 UNITS TOTAL) BY MOUTH DAILY. 06/28/18   Weber, Damaris Hippo, PA-C  gabapentin (NEURONTIN) 300 MG capsule Take 1 capsule (300 mg total) by mouth 3 (three) times daily. 12/25/18   Penumalli, Earlean Polka, MD  lidocaine (LIDODERM) 5 % Place 1 patch onto the skin daily. Remove & Discard patch within 12 hours or as directed by MD 01/06/19   Davonna Belling, MD  Ocrelizumab (OCREVUS IV) Inject into the vein.    [provider]  predniSONE (DELTASONE) 10 MG tablet Take 60mg  on day 1. Reduce by 10mg  each subsequent day. (60, 50, 40, 30, 20, 10, stop) 12/27/18   Penumalli, Earlean Polka, MD  telmisartan-hydrochlorothiazide (MICARDIS HCT) 80-12.5 MG tablet Take 1 tablet by mouth daily. 05/19/17   Gale Journey, Damaris Hippo, PA-C  varenicline (CHANTIX STARTING MONTH PAK) 0.5 MG X 11 & 1 MG X 42 tablet Take one 0.5 mg tablet by mouth once daily for 3 days, then increase to one 0.5 mg tablet twice daily for 4 days, then increase to one 1 mg tablet twice daily. 10/20/18   Scot Jun, FNP    Family  History Family History  Problem Relation Age of Onset  . Mental illness Mother   . Hyperlipidemia Mother   . Hyperthyroidism Mother   . Mental retardation Mother   . ADD / ADHD Son   . Diabetes Father   . Cancer Father   . Cancer Paternal Aunt        breast  . Arthritis Maternal Grandmother   . Diabetes Maternal Grandmother   . Hearing loss Maternal Grandmother        left hear when she was a child  . Hyperlipidemia Maternal Grandmother   . Hypertension Maternal Grandmother   . Alcohol abuse Maternal Grandfather   . Early death Maternal Grandfather   . Cancer Paternal Aunt        breast  . Multiple sclerosis Cousin     Social History Social History   Tobacco Use  . Smoking status: Former Smoker    Packs/day: 0.50    Years: 25.00    Pack years: 12.50    Types: Cigarettes    Last attempt to quit: 09/16/2018    Years since  quitting: 0.3  . Smokeless tobacco: Never Used  . Tobacco comment: 12/25/18  quit 2 months ago  Substance Use Topics  . Alcohol use: Yes    Alcohol/week: 0.0 standard drinks    Comment: once maybe a month if that  . Drug use: No     Allergies   Iron; Tysabri [natalizumab]; and Delsym [dextromethorphan polistirex er]   Review of Systems Review of Systems  Constitutional: Negative for appetite change.  HENT: Negative for congestion.   Respiratory: Negative for shortness of breath.   Gastrointestinal: Negative for abdominal pain.  Genitourinary: Negative for flank pain.  Musculoskeletal: Positive for back pain. Negative for gait problem.  Skin: Negative for rash.  Neurological: Negative for weakness and numbness.  Psychiatric/Behavioral: Negative for confusion.     Physical Exam Updated Vital Signs BP (!) 166/90 (BP Location: Right Arm)   Pulse (!) 122   Temp 98.1 F (36.7 C) (Oral)   Resp 18   Ht 5\' 3"  (1.6 m)   Wt 133.8 kg   SpO2 100%   BMI 52.26 kg/m   Physical Exam HENT:     Head: Atraumatic.     Mouth/Throat:     Mouth: Mucous membranes are moist.  Neck:     Musculoskeletal: Neck supple.  Cardiovascular:     Rate and Rhythm: Normal rate.  Pulmonary:     Breath sounds: Normal breath sounds.  Abdominal:     Tenderness: There is no abdominal tenderness.  Musculoskeletal:     Comments: Tenderness over left SI area.  Pain with raising the leg on the left although has pretty good range of motion.  No rash.  Perineal sensation intact.  Skin:    General: Skin is warm.     Capillary Refill: Capillary refill takes less than 2 seconds.  Neurological:     General: No focal deficit present.     Mental Status: She is alert.      ED Treatments / Results  Labs (all labs ordered are listed, but only abnormal results are displayed) Labs Reviewed - No data to display  EKG None  Radiology No results found.  Procedures Procedures (including critical care  time)  Medications Ordered in ED Medications  ketorolac (TORADOL) 30 MG/ML injection 30 mg (30 mg Intramuscular Given 01/06/19 1030)     Initial Impression / Assessment and Plan / ED Course  I  have reviewed the triage vital signs and the nursing notes.  Pertinent labs & imaging results that were available during my care of the patient were reviewed by me and considered in my medical decision making (see chart for details).        Patient with left-sided back pain.  Likely sciatica.  Has MRI scheduled for tomorrow.  No relief at home with steroids Neurontin or Norco.  Will try Lidoderm patch.  Follow-up with MRI tomorrow.  Final Clinical Impressions(s) / ED Diagnoses   Final diagnoses:  Sciatica of left side    ED Discharge Orders         Ordered    lidocaine (LIDODERM) 5 %  Every 24 hours     01/06/19 1135           Davonna Belling, MD 01/06/19 1140

## 2019-01-07 ENCOUNTER — Other Ambulatory Visit (HOSPITAL_COMMUNITY): Payer: 59

## 2019-01-07 ENCOUNTER — Other Ambulatory Visit: Payer: Self-pay | Admitting: Diagnostic Neuroimaging

## 2019-01-07 ENCOUNTER — Encounter: Payer: Self-pay | Admitting: *Deleted

## 2019-01-07 ENCOUNTER — Ambulatory Visit (HOSPITAL_COMMUNITY)
Admission: RE | Admit: 2019-01-07 | Discharge: 2019-01-07 | Disposition: A | Discharge status: Home / Self Care | Payer: 59 | Source: Ambulatory Visit | Attending: Diagnostic Neuroimaging | Admitting: Diagnostic Neuroimaging

## 2019-01-07 ENCOUNTER — Telehealth: Payer: Self-pay | Admitting: *Deleted

## 2019-01-07 DIAGNOSIS — G35 Multiple sclerosis: Secondary | ICD-10-CM | POA: Insufficient documentation

## 2019-01-07 DIAGNOSIS — M79605 Pain in left leg: Secondary | ICD-10-CM

## 2019-01-07 DIAGNOSIS — G9389 Other specified disorders of brain: Secondary | ICD-10-CM | POA: Diagnosis not present

## 2019-01-07 DIAGNOSIS — M5127 Other intervertebral disc displacement, lumbosacral region: Secondary | ICD-10-CM | POA: Diagnosis not present

## 2019-01-07 MED ORDER — ALPRAZOLAM 0.5 MG PO TABS: 0.5000 mg | ORAL_TABLET | ORAL | 0 refills | Status: DC | PRN

## 2019-01-07 MED ORDER — GADOBUTROL 1 MMOL/ML IV SOLN
10.0000 mL | Freq: Once | INTRAVENOUS | Status: AC | PRN
  Administered 2019-01-07: 10 mL via INTRAVENOUS

## 2019-01-07 NOTE — Telephone Encounter (Signed)
Xanax prescription successfully faxed to [facility]. My chart sent to patient to make her aware.

## 2019-01-07 NOTE — Addendum Note (Signed)
Addended by: Andrey Spearman R on: 01/07/2019 01:14 PM   Modules accepted: Orders

## 2019-01-07 NOTE — Telephone Encounter (Signed)
Received my chart from patient stating she was unable to do MRI Sat due pain in her leg. She asked for medication to relieve some pain. She did see PCP last week, was given hydrocodone which didn't help. I advised Dr Leta Baptist will prescribe Xanax to help her relax. She rescheduled MRI for 5 pm today, wants Xanax prescribed to [facility]. I advised she may add Tylenol, ibuprofen as needed. She verbalized understanding, appreciation.

## 2019-01-08 ENCOUNTER — Telehealth: Payer: Self-pay | Admitting: *Deleted

## 2019-01-08 ENCOUNTER — Encounter: Payer: Self-pay | Admitting: *Deleted

## 2019-01-08 DIAGNOSIS — R937 Abnormal findings on diagnostic imaging of other parts of musculoskeletal system: Secondary | ICD-10-CM

## 2019-01-08 DIAGNOSIS — M79605 Pain in left leg: Secondary | ICD-10-CM

## 2019-01-08 MED ORDER — PREDNISONE 10 MG PO TABS: ORAL_TABLET | ORAL | 0 refills | Status: DC

## 2019-01-08 NOTE — Telephone Encounter (Signed)
Spoke with patient and informed her that her MRI brain is stable since 2018.  I advised her Dr Leta Baptist reviewed MRI lumbar report and stated it shows arthritis changes. There are no surgically treatable issues. Dr Leta Baptist advised she can try another prednisone dose pack, and he will refer to pain management and PT eval.  The patient stated she will hold off on PT because she is having so much pain and difficulty getting out of bed.   She wants referral to Dr Maryjean Ka for possible injections, pain management and prednisone sent to [facility], [address]. She stated she needs to get back to work; I asked about her getting FMLA. She will discuss with her PCP.  She verbalized understanding, appreciation.

## 2019-01-08 NOTE — Telephone Encounter (Signed)
Called with patient and informed her labs showed her WBC is elevated.  I asked if she's had any signs of infection  (fever, cough, UTI). She stated she has not, only pain. She stated her PCP checked her urine and also mentioned her increased WBC. I advised she follow up with PCP if she has any symptoms of infection at all. Patient verbalized understanding, appreciation.

## 2019-01-08 NOTE — Telephone Encounter (Signed)
Sent my chart and informed patient of new Rx and referral placed.

## 2019-01-08 NOTE — Telephone Encounter (Signed)
Orders Placed This Encounter  Procedures  . Ambulatory referral to Neurosurgery  Order to Dr. Maryjean Ka pain mgmt.   Meds ordered this encounter  Medications  . predniSONE (DELTASONE) 10 MG tablet    Sig: Take 60mg  on day 1. Reduce by 10mg  each subsequent day. (60, 50, 40, 30, 20, 10, stop)    Dispense:  21 tablet    Refill:  0   Penni Bombard, MD 0/72/2575, 0:51 PM Certified in Neurology, Neurophysiology and [facility] [address], [place], [place] [address] [phone]

## 2019-01-09 DIAGNOSIS — M47817 Spondylosis without myelopathy or radiculopathy, lumbosacral region: Secondary | ICD-10-CM | POA: Diagnosis not present

## 2019-01-09 DIAGNOSIS — M51379 Other intervertebral disc degeneration, lumbosacral region without mention of lumbar back pain or lower extremity pain: Secondary | ICD-10-CM | POA: Insufficient documentation

## 2019-01-09 DIAGNOSIS — M5137 Other intervertebral disc degeneration, lumbosacral region: Secondary | ICD-10-CM | POA: Insufficient documentation

## 2019-01-09 DIAGNOSIS — M5442 Lumbago with sciatica, left side: Secondary | ICD-10-CM | POA: Diagnosis not present

## 2019-01-11 ENCOUNTER — Ambulatory Visit
Admission: EM | Admit: 2019-01-11 | Discharge: 2019-01-11 | Disposition: A | Discharge status: Home / Self Care | Payer: 59 | Attending: Family Medicine | Admitting: Family Medicine

## 2019-01-11 ENCOUNTER — Encounter: Payer: Self-pay | Admitting: Family Medicine

## 2019-01-11 DIAGNOSIS — M5442 Lumbago with sciatica, left side: Secondary | ICD-10-CM

## 2019-01-11 MED ORDER — KETOROLAC TROMETHAMINE 60 MG/2ML IM SOLN: 60.0000 mg | Freq: Once | INTRAMUSCULAR | Status: DC

## 2019-01-11 MED ORDER — OXYCODONE-ACETAMINOPHEN 5-325 MG PO TABS: 1.0000 | ORAL_TABLET | Freq: Four times a day (QID) | ORAL | 0 refills | Status: DC | PRN

## 2019-01-11 NOTE — ED Triage Notes (Signed)
Pt c/o lt hip/leg pain for almost 5wks; states has had a MRI, pain meds, steroid shots with no relief

## 2019-01-11 NOTE — Discharge Instructions (Signed)
Medication as needed.  Best of luck.  Dr. Lacinda Axon

## 2019-01-11 NOTE — ED Provider Notes (Signed)
[facility]    CSN: [id number] Arrival date & time: 01/11/19  1745  History   Chief Complaint Chief Complaint  Patient presents with  . Leg Pain   HPI  48 year old female presents with low back pain with sciatica.  Patient has had ongoing low back pain with radicular symptoms.  She has recently been seen by her primary care physician as well as the ER.  She has had an MRI as well.  She has had no relief in her pain with hydrocodone, gabapentin, prednisone, lidocaine patches.  Patient is in severe pain.  Left low back and radiates down the leg.  She reports numbness in her left lower extremity.  Patient is requesting a Toradol shot today.  Patient is unsure of what she should do given her continued pain.  Worse with activity and pressure.  No relieving factors.  No other complaints.  No reports of saddle anesthesia or incontinence.  PMH, Surgical Hx, Family Hx, Social History reviewed and updated as below.  Past Medical History:  Diagnosis Date  . Allergy   . Anemia   . Anxiety   . Depression   . Hypertension   . Migraine   . MS (multiple sclerosis) ([place])   . OSA on CPAP   . Sickle cell trait ([place])   . Smoker   . Tachycardia     Patient Active Problem List   Diagnosis Date Noted  . High risk medication use 06/12/2018  . 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 Other fatigue 08/05/2016  . Migraine without aura and without status migrainosus, not intractable 08/05/2016  . Prediabetes   . MS (multiple sclerosis) ([place])   . Obesity, Class III, BMI 40-49.9 (morbid obesity) ([place]) 10/16/2014  . Smoker 10/16/2014  . OSA on CPAP 10/16/2014  . HTN (hypertension) 01/14/2012  . Sickle cell trait ([place]) 01/14/2012    Past Surgical History:  Procedure Laterality Date  . CERVICAL ABLATION    . CESAREAN SECTION     3 times  . CHOLECYSTECTOMY    . TUBAL LIGATION      OB History   No obstetric history on file.      Home Medications    Prior to Admission  medications   Medication Sig Start Date End Date Taking? Authorizing Provider  ALPRAZolam Duanne Moron) 0.5 MG tablet Take 1 tablet (0.5 mg total) by mouth as needed for anxiety (for sedation before MRI scan; take 1 hour before scan; may repeat 15 min before scan). 01/07/19   Penumalli, Earlean Polka, MD  Cholecalciferol (VITAMIN D3) 2000 units capsule TAKE 1 CAPSULE (2,000 UNITS TOTAL) BY MOUTH DAILY. 06/28/18   Weber, Damaris Hippo, PA-C  gabapentin (NEURONTIN) 300 MG capsule Take 1 capsule (300 mg total) by mouth 3 (three) times daily. 12/25/18   Penumalli, Earlean Polka, MD  hydrochlorothiazide (MICROZIDE) 12.5 MG capsule Take 12.5 mg by mouth. 01/04/19   [provider]  lidocaine (LIDODERM) 5 % Place 1 patch onto the skin daily. Remove & Discard patch within 12 hours or as directed by MD 01/06/19   Davonna Belling, MD  Ocrelizumab (OCREVUS IV) Inject into the vein.    [provider]  oxyCODONE-acetaminophen (PERCOCET/ROXICET) 5-325 MG tablet Take 1-2 tablets by mouth every 6 (six) hours as needed for severe pain. 01/11/19   Coral Spikes, DO  predniSONE (DELTASONE) 10 MG tablet Take 60mg  on day 1. Reduce by 10mg  each subsequent day. (60, 50, 40, 30, 20, 10, stop) 01/08/19   Penumalli, Vikram R,  MD  telmisartan-hydrochlorothiazide (MICARDIS HCT) 80-12.5 MG tablet Take 1 tablet by mouth daily. 05/19/17   Gale Journey, Damaris Hippo, PA-C  varenicline (CHANTIX STARTING MONTH PAK) 0.5 MG X 11 & 1 MG X 42 tablet Take one 0.5 mg tablet by mouth once daily for 3 days, then increase to one 0.5 mg tablet twice daily for 4 days, then increase to one 1 mg tablet twice daily. 10/20/18   Scot Jun, FNP    Family History Family History  Problem Relation Age of Onset  . Mental illness Mother   . Hyperlipidemia Mother   . Hyperthyroidism Mother   . Mental retardation Mother   . ADD / ADHD Son   . Diabetes Father   . Cancer Father   . Cancer Paternal Aunt        breast  . Arthritis Maternal Grandmother   . Diabetes  Maternal Grandmother   . Hearing loss Maternal Grandmother        left hear when she was a child  . Hyperlipidemia Maternal Grandmother   . Hypertension Maternal Grandmother   . Alcohol abuse Maternal Grandfather   . Early death Maternal Grandfather   . Cancer Paternal Aunt        breast  . Multiple sclerosis Cousin     Social History Social History   Tobacco Use  . Smoking status: Former Smoker    Packs/day: 0.50    Years: 25.00    Pack years: 12.50    Types: Cigarettes    Last attempt to quit: 09/16/2018    Years since quitting: 0.3  . Smokeless tobacco: Never Used  . Tobacco comment: 12/25/18  quit 2 months ago  Substance Use Topics  . Alcohol use: Yes    Alcohol/week: 0.0 standard drinks    Comment: once maybe a month if that  . Drug use: No     Allergies   Iron; Tysabri [natalizumab]; and Delsym [dextromethorphan polistirex er]   Review of Systems Review of Systems  Musculoskeletal: Positive for back pain.  Neurological:       Left leg numbness.   Physical Exam Triage Vital Signs ED Triage Vitals  Enc Vitals Group     BP 01/11/19 1800 126/88     Pulse Rate 01/11/19 1800 (!) 130     Resp 01/11/19 1800 20     Temp 01/11/19 1800 98.4 F (36.9 C)     Temp Source 01/11/19 1800 Oral     SpO2 01/11/19 1800 96 %     Weight --      Height --      Head Circumference --      Peak Flow --      Pain Score 01/11/19 1801 10     Pain Loc --      Pain Edu? --      Excl. in [place]? --    Updated Vital Signs BP 126/88 (BP Location: Left Arm)   Pulse (!) 130   Temp 98.4 F (36.9 C) (Oral)   Resp 20   SpO2 96%   Visual Acuity Right Eye Distance:   Left Eye Distance:   Bilateral Distance:    Right Eye Near:   Left Eye Near:    Bilateral Near:     Physical Exam Vitals signs and nursing note reviewed.  Constitutional:      Appearance: She is not ill-appearing.     Comments: Morbidly obese.  Appears to be uncomfortable and in pain.  HENT:  Head:  Normocephalic and atraumatic.  Eyes:     General: No scleral icterus.    Conjunctiva/sclera: Conjunctivae normal.  Cardiovascular:     Rate and Rhythm: Normal rate and regular rhythm.  Pulmonary:     Effort: Pulmonary effort is normal. No respiratory distress.  Musculoskeletal:     Comments: Patient was not put pressure on her left side, and will not sit on her left buttock.  Patient in severe pain.  Given thorough evaluations previously, further physical exam was not done of the lumbar spine.  Neurological:     Mental Status: She is alert.  Psychiatric:        Mood and Affect: Mood normal.        Behavior: Behavior normal.    UC Treatments / Results  Labs (all labs ordered are listed, but only abnormal results are displayed) Labs Reviewed - No data to display  EKG None  Radiology No results found.  Procedures Procedures (including critical care time)  Medications Ordered in UC Medications  ketorolac (TORADOL) injection 60 mg (has no administration in time range)    Initial Impression / Assessment and Plan / UC Course  I have reviewed the triage vital signs and the nursing notes.  Pertinent labs & imaging results that were available during my care of the patient were reviewed by me and considered in my medical decision making (see chart for details).    48 year old female presents with continued low back pain.  MRI results have been reviewed.  Patient needs to see physician who performs injections.  Patient given Toradol today for pain.  Placing on oxycodone to hopefully provide her some relief.  [place] controlled substance database reviewed.  No concerns for abuse at this time.  Final Clinical Impressions(s) / UC Diagnoses   Final diagnoses:  Acute left-sided low back pain with left-sided sciatica     Discharge Instructions     Medication as needed.  Best of luck.  Dr. Lacinda Axon    ED Prescriptions    Medication Sig Dispense Auth. Provider    oxyCODONE-acetaminophen (PERCOCET/ROXICET) 5-325 MG tablet Take 1-2 tablets by mouth every 6 (six) hours as needed for severe pain. 10 tablet Coral Spikes, DO     Controlled Substance Prescriptions [place] Controlled Substance Registry consulted? Yes, I have consulted the [place] Controlled Substances Registry for this patient, and feel the risk/benefit ratio today is favorable for proceeding with this prescription for a controlled substance.   Coral Spikes, [place] 01/11/19 1836

## 2019-01-12 ENCOUNTER — Ambulatory Visit: Payer: 59 | Admitting: Family Medicine

## 2019-01-14 DIAGNOSIS — M5442 Lumbago with sciatica, left side: Secondary | ICD-10-CM | POA: Diagnosis not present

## 2019-01-15 DIAGNOSIS — G5702 Lesion of sciatic nerve, left lower limb: Secondary | ICD-10-CM | POA: Diagnosis not present

## 2019-01-15 DIAGNOSIS — M5416 Radiculopathy, lumbar region: Secondary | ICD-10-CM | POA: Diagnosis not present

## 2019-01-16 DIAGNOSIS — G5702 Lesion of sciatic nerve, left lower limb: Secondary | ICD-10-CM | POA: Diagnosis not present

## 2019-01-17 DIAGNOSIS — B349 Viral infection, unspecified: Secondary | ICD-10-CM | POA: Diagnosis not present

## 2019-01-19 ENCOUNTER — Other Ambulatory Visit: Payer: Self-pay

## 2019-01-19 ENCOUNTER — Emergency Department (HOSPITAL_COMMUNITY)
Admission: EM | Admit: 2019-01-19 | Discharge: 2019-01-19 | Disposition: A | Discharge status: Home / Self Care | Payer: 59 | Attending: Emergency Medicine | Admitting: Emergency Medicine

## 2019-01-19 ENCOUNTER — Encounter (HOSPITAL_COMMUNITY): Payer: Self-pay | Admitting: Emergency Medicine

## 2019-01-19 DIAGNOSIS — M5432 Sciatica, left side: Secondary | ICD-10-CM | POA: Diagnosis not present

## 2019-01-19 DIAGNOSIS — Z87891 Personal history of nicotine dependence: Secondary | ICD-10-CM | POA: Insufficient documentation

## 2019-01-19 DIAGNOSIS — I10 Essential (primary) hypertension: Secondary | ICD-10-CM | POA: Diagnosis not present

## 2019-01-19 DIAGNOSIS — Z79899 Other long term (current) drug therapy: Secondary | ICD-10-CM | POA: Diagnosis not present

## 2019-01-19 DIAGNOSIS — M545 Low back pain: Secondary | ICD-10-CM | POA: Diagnosis present

## 2019-01-19 DIAGNOSIS — M549 Dorsalgia, unspecified: Secondary | ICD-10-CM | POA: Diagnosis not present

## 2019-01-19 MED ORDER — KETOROLAC TROMETHAMINE 15 MG/ML IJ SOLN
15.0000 mg | Freq: Once | INTRAMUSCULAR | Status: AC
  Administered 2019-01-19: 15 mg via INTRAMUSCULAR
  Filled 2019-01-19: qty 1

## 2019-01-19 NOTE — ED Provider Notes (Addendum)
[facility] DEPT Provider Note   CSN: [id number] Arrival date & time: 01/19/19  0446    History   Chief Complaint Chief Complaint  Patient presents with  . Sciatica    HPI [name] is a 48 y.o. female presenting today for left-sided lower back pain and sciatica.  Patient states that she has history of sciatica and has been having a "sciatica flare" since late February.  Patient had MRI performed of her lumbar spine on 01/07/2019 -------------------------------- IMPRESSION: L4-5: Bilateral facet arthropathy with gaping, fluid-filled joints. Mild biforaminal disc bulges. No visible neural compression in this position. With standing or flexion, the patient could develop anterolisthesis, which could be significant. Facet arthropathy could certainly be a cause of back pain or referred facet syndrome pain as well.  L5-S1: Small left paracentral disc protrusion which approaches the left S1 nerve but does not visibly compress or displace it. It is possible this could be associated with left S1 nerve irritation. Mild facet osteoarthritis at this level could contribute to back Pain. --------------------------------- Patient states that she was seen at her primary care doctor's office on 01/14/2019 and was given a 60 mg Toradol shot at that time as well as Toradol pills to take at home.  Patient states that the Toradol injection worked well for her pain however it returned later on that day.  Patient states that she has not taking any of the Toradol pills as she knows they do not work for her.  Patient reports that she was then seen at her neurosurgeon Dr. Donell Sievert office on 01/16/2019 at which time she received a Kenalog injection into her spine.  Patient states that this did not help her pain.  On 01/17/2019 patient was seen on an ED visit with her primary care provider for a cough.  Patient was prescribed Tessalon for a viral cough which she has been taking  with moderate relief.  Patient describes her cough as a improving mild intermittent nonproductive cough.  She denies shortness of breath, fever or chest pain.  She denies nausea/vomiting or any other concerns with this.  Patient presents today for her sciatic pain, left-sided lower back, sharp shooting pain down the left leg severe worsened with palpation of the lower back and movement of the left leg.  Patient states she has intermittent tingling of the foot however denies true numbness.  She denies weakness of the extremity however is reluctant to move it secondary to pain.  Patient denies change in her pain and states that she has had this same pain for many years.  Of note patient states that she finished a course of prednisone on 01/14/2019 which did little for her symptoms.  Patient with both hydrocodone-acetaminophen and oxycodone-acetaminophen at bedside that she states that she does not take for her pain because they do not work.    HPI  Past Medical History:  Diagnosis Date  . Allergy   . Anemia   . Anxiety   . Depression   . Hypertension   . Migraine   . MS (multiple sclerosis) ([place])   . OSA on CPAP   . Sickle cell trait ([place])   . Smoker   . Tachycardia     Patient Active Problem List   Diagnosis Date Noted  . High risk medication use 06/12/2018  . 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 Other fatigue 08/05/2016  . Migraine without aura and without status migrainosus, not intractable 08/05/2016  . Prediabetes   . MS (multiple  sclerosis) ([place])   . Obesity, Class III, BMI 40-49.9 (morbid obesity) ([place]) 10/16/2014  . Smoker 10/16/2014  . OSA on CPAP 10/16/2014  . HTN (hypertension) 01/14/2012  . Sickle cell trait ([place]) 01/14/2012    Past Surgical History:  Procedure Laterality Date  . CERVICAL ABLATION    . CESAREAN SECTION     3 times  . CHOLECYSTECTOMY    . TUBAL LIGATION       OB History   No obstetric history on file.      Home Medications    Prior to  Admission medications   Medication Sig Start Date End Date Taking? Authorizing Provider  ALPRAZolam Duanne Moron) 0.5 MG tablet Take 1 tablet (0.5 mg total) by mouth as needed for anxiety (for sedation before MRI scan; take 1 hour before scan; may repeat 15 min before scan). 01/07/19  Yes Penumalli, Earlean Polka, MD  azelastine (ASTELIN) 0.1 % nasal spray Place 1 spray into both nostrils 2 (two) times daily as needed for rhinitis. Use in each nostril as directed   Yes [provider]  benzonatate (TESSALON) 100 MG capsule Take 100 mg by mouth 3 (three) times daily as needed for cough.   Yes [provider]  Cholecalciferol (VITAMIN D3) 2000 units capsule TAKE 1 CAPSULE (2,000 UNITS TOTAL) BY MOUTH DAILY. Patient taking differently: Take 2,000 Units by mouth daily.  06/28/18  Yes Weber, Sarah L, PA-C  gabapentin (NEURONTIN) 300 MG capsule Take 1 capsule (300 mg total) by mouth 3 (three) times daily. 12/25/18  Yes Penumalli, Earlean Polka, MD  hydrochlorothiazide (MICROZIDE) 12.5 MG capsule Take 12.5 mg by mouth daily.  01/04/19  Yes [provider]  HYDROcodone-acetaminophen (NORCO/VICODIN) 5-325 MG tablet Take 1 tablet by mouth every 6 (six) hours as needed for moderate pain.   Yes [provider]  ketorolac (TORADOL) 10 MG tablet Take 10 mg by mouth every 6 (six) hours as needed for moderate pain.   Yes [provider]  lidocaine (LIDODERM) 5 % Place 1 patch onto the skin daily. Remove & Discard patch within 12 hours or as directed by MD Patient taking differently: Place 1 patch onto the skin daily as needed (pain). Remove & Discard patch within 12 hours or as directed by MD 01/06/19  Yes Davonna Belling, MD  oxyCODONE-acetaminophen (PERCOCET/ROXICET) 5-325 MG tablet Take 1-2 tablets by mouth every 6 (six) hours as needed for severe pain. 01/11/19  Yes Cook, Jayce G, DO  telmisartan-hydrochlorothiazide (MICARDIS HCT) 80-12.5 MG tablet Take 1 tablet by mouth daily. 05/19/17  Yes  Weber, Damaris Hippo, PA-C  Ocrelizumab (OCREVUS IV) Inject into the vein See admin instructions. Every 6 months    [provider]  predniSONE (DELTASONE) 10 MG tablet Take 60mg  on day 1. Reduce by 10mg  each subsequent day. (60, 50, 40, 30, 20, 10, stop) Patient not taking: Reported on 01/19/2019 01/08/19   Penumalli, Earlean Polka, MD  varenicline (CHANTIX STARTING MONTH PAK) 0.5 MG X 11 & 1 MG X 42 tablet Take one 0.5 mg tablet by mouth once daily for 3 days, then increase to one 0.5 mg tablet twice daily for 4 days, then increase to one 1 mg tablet twice daily. Patient not taking: Reported on 01/19/2019 10/20/18   Scot Jun, FNP    Family History Family History  Problem Relation Age of Onset  . Mental illness Mother   . Hyperlipidemia Mother   . Hyperthyroidism Mother   . Mental retardation Mother   . ADD /  ADHD Son   . Diabetes Father   . Cancer Father   . Cancer Paternal Aunt        breast  . Arthritis Maternal Grandmother   . Diabetes Maternal Grandmother   . Hearing loss Maternal Grandmother        left hear when she was a child  . Hyperlipidemia Maternal Grandmother   . Hypertension Maternal Grandmother   . Alcohol abuse Maternal Grandfather   . Early death Maternal Grandfather   . Cancer Paternal Aunt        breast  . Multiple sclerosis Cousin     Social History Social History   Tobacco Use  . Smoking status: Former Smoker    Packs/day: 0.50    Years: 25.00    Pack years: 12.50    Types: Cigarettes    Last attempt to quit: 09/16/2018    Years since quitting: 0.3  . Smokeless tobacco: Never Used  . Tobacco comment: 12/25/18  quit 2 months ago  Substance Use Topics  . Alcohol use: Yes    Alcohol/week: 0.0 standard drinks    Comment: once maybe a month if that  . Drug use: No    Allergies   Iron; Tysabri [natalizumab]; and Delsym [dextromethorphan polistirex er]   Review of Systems Review of Systems  Constitutional: Negative.  Negative for appetite  change, chills and fever.  Respiratory: Positive for cough (States that is improving). Negative for shortness of breath.   Cardiovascular: Negative.  Negative for chest pain.  Gastrointestinal: Negative.  Negative for abdominal pain, nausea and vomiting.  Genitourinary: Negative.  Negative for dysuria and hematuria.  Musculoskeletal: Positive for back pain. Negative for neck pain.  Skin: Negative.  Negative for color change and rash.  Neurological: Positive for numbness (Occasional tingling down left leg). Negative for syncope, weakness and headaches.       Denies saddle paresthesias Denies urinary retention Denies bowel/bladder incontinence   Physical Exam Updated Vital Signs BP 105/78   Pulse 85   Temp 98 F (36.7 C) (Oral)   Resp 15   Ht 5\' 4"  (1.626 m)   Wt 133.8 kg   SpO2 100%   BMI 50.64 kg/m   Physical Exam Constitutional:      General: She is not in acute distress.    Appearance: Normal appearance. She is obese. She is not ill-appearing or diaphoretic.     Comments: Morbidly obese  HENT:     Head: Normocephalic and atraumatic. No raccoon eyes or Battle's sign.     Jaw: There is normal jaw occlusion. No trismus.     Right Ear: Tympanic membrane, ear canal and external ear normal. No hemotympanum.     Left Ear: Tympanic membrane, ear canal and external ear normal. No hemotympanum.     Nose: Rhinorrhea present. Rhinorrhea is clear.     Right Sinus: No maxillary sinus tenderness or frontal sinus tenderness.     Left Sinus: No maxillary sinus tenderness or frontal sinus tenderness.     Mouth/Throat:     Lips: Pink.     Mouth: Mucous membranes are moist.     Pharynx: Oropharynx is clear. Uvula midline.  Eyes:     General: Vision grossly intact. Gaze aligned appropriately.     Extraocular Movements: Extraocular movements intact.     Conjunctiva/sclera: Conjunctivae normal.     Pupils: Pupils are equal, round, and reactive to light.  Neck:     Musculoskeletal: Normal  range of motion and neck  supple. No neck rigidity.     Trachea: Trachea and phonation normal. No tracheal tenderness or tracheal deviation.  Cardiovascular:     Rate and Rhythm: Normal rate and regular rhythm.     Pulses:          Radial pulses are 2+ on the right side and 2+ on the left side.       Dorsalis pedis pulses are 2+ on the right side and 2+ on the left side.       Posterior tibial pulses are 2+ on the right side and 2+ on the left side.     Heart sounds: Normal heart sounds.  Pulmonary:     Effort: Pulmonary effort is normal. No respiratory distress.     Breath sounds: Normal breath sounds and air entry. No decreased breath sounds or rhonchi.  Chest:     Chest wall: No tenderness.  Abdominal:     General: Bowel sounds are normal. There is no distension.     Palpations: Abdomen is soft. There is no pulsatile mass.     Tenderness: There is no abdominal tenderness. There is no right CVA tenderness, left CVA tenderness, guarding or rebound.  Musculoskeletal:       Back:     Comments: No midline C/T/L spinal tenderness to palpation no deformity, crepitus, or step-off noted. No sign of injury to the neck or back.  Reproducible tenderness of the left gluteal muscles.  Positive straight left leg raise.  Feet:     Right foot:     Protective Sensation: 5 sites tested. 5 sites sensed.     Left foot:     Protective Sensation: 5 sites tested. 5 sites sensed.  Skin:    General: Skin is warm and dry.     Capillary Refill: Capillary refill takes less than 2 seconds.  Neurological:     Mental Status: She is alert.     GCS: GCS eye subscore is 4. GCS verbal subscore is 5. GCS motor subscore is 6.     Comments: Speech is clear and goal oriented, follows commands Major Cranial nerves without deficit, no facial droop Normal strength in upper and lower extremities bilaterally including dorsiflexion and plantar flexion, strong and equal grip strength Deep tendon reflexes 2+ patella  tendons, no clonus of the feet. Sensation normal to light touch Moves extremities without ataxia, coordination intact Ambulatory around room without assistance, slight limping gait secondary to pain, no foot drop.  Psychiatric:        Behavior: Behavior is cooperative.    ED Treatments / Results  Labs (all labs ordered are listed, but only abnormal results are displayed) Labs Reviewed - No data to display  EKG None  Radiology No results found.  Procedures Procedures (including critical care time)  Medications Ordered in ED Medications  ketorolac (TORADOL) 15 MG/ML injection 15 mg (15 mg Intramuscular Given 01/19/19 0742)     Initial Impression / Assessment and Plan / ED Course  I have reviewed the triage vital signs and the nursing notes.  Pertinent labs & imaging results that were available during my care of the patient were reviewed by me and considered in my medical decision making (see chart for details).    [name] is a 48 y.o. female presenting today with left-sided low back pain with left-sided sciatica that she describes as her typical sciatic-like pain without abnormal features.  Patient denies history of Trauma, fever, IV drug use, night sweats, weight loss, cancer,  saddle anesthesia, urinary rentention, bowel/bladder incontinence. No neurological deficits and normal neuro exam.  Patient's pain is consistently reproducible with palpation of the left gluteal musculature and left straight leg raise.  Abdomen soft/nontender and without pulsatile mass on exam today. Patient with equal pedal pulses. Doubt spinal epidural abscess, cauda equina or AAA.  As patient had MRI performed approximately 2 weeks ago and there is been no history of injury or change to her pain and do not believe that further imaging is indicated at this time. - Patient has tried multiple medications and states that the only medication that works well for her is a Toradol injection.  She  received injection 5 days ago and has not been taking her oral Toradol or other NSAIDS.  She does not have a history of CKD and denies history of gastric problems.  Feel that it is reasonable today to give her a small dose of 15 mg Toradol.  I have encouraged her to drink plenty of water.  We had a long discussion that NSAID medication such as Toradol can affect her kidneys and stomach and that she should avoid receiving this injection too often in the future.  Patient states understanding and still wishes to proceed with Toradol injection today.  She states that she will call her primary care doctor's office on Monday to schedule a follow-up appointment as well as her neurosurgeon on Monday to schedule follow-up appointment.  Patient reassessed after Toradol injection, she states that she is feeling improved.  She is able to ambulate around the room without assistance and is requesting discharge.  RICE protocol also discussed.   - Additionally patient with mild intermittent nonproductive cough that has been improving.  Lungs clear to auscultation bilaterally.  Patient that shortness of breath or chest pain.  No indication for imaging at this time.  Patient here to follow-up with PCP for likely viral upper respiratory tract infection. - At this time there does not appear to be any evidence of an acute emergency medical condition and the patient appears stable for discharge with appropriate outpatient follow up. Diagnosis was discussed with patient who verbalizes understanding of care plan and is agreeable to discharge. I have discussed return precautions with patient and daughter who verbalize understanding of return precautions. Patient strongly encouraged to follow-up with their PCP and neuro. All questions answered. Patient has been discharged in good condition.  Patient's case discussed with Dr. Christy Gentles who agrees with plan of care today and to discharge with outpatient follow-up.   Note: Portions of  this report may have been transcribed using voice recognition software. Every effort was made to ensure accuracy; however, inadvertent computerized transcription errors may still be present. Final Clinical Impressions(s) / ED Diagnoses   Final diagnoses:  Left sided sciatica    ED Discharge Orders    None       Deliah Boston, PA-C 01/19/19 0813    Deliah Boston, PA-C 01/19/19 0815    Ripley Fraise, MD 01/20/19 1049

## 2019-01-19 NOTE — Discharge Instructions (Addendum)
You have been diagnosed today with Left Sided Sciatica.  At this time there does not appear to be the presence of an emergent medical condition, however there is always the potential for conditions to change. Please read and follow the below instructions.  Please return to the Emergency Department immediately for any new or worsening symptoms. Please be sure to follow up with your Primary Care Provider within one week regarding your visit today; please call their office to schedule an appointment even if you are feeling better for a follow-up visit. You have been given a shot of Toradol today.  We advised that you follow-up with your neurosurgeon, neurologist and primary care provider for further evaluation and treatment of your left-sided lower back pain with sciatica.  Please avoid using your oral Toradol medication since you have been given the shot today.  Please be sure to drink plenty of water.  Using medications like Toradol too often can lead to kidney and stomach damage, it is important to limit the amount of NSAIDs like Toradol that you take to avoid this damage; please drink plenty of water and discuss this with your primary care provider.  Get help right away if: You have severe pain, weakness, or numbness. You have difficulty with bladder or bowel control. You have fever You have shortness of breath or chest pain You cough gets worse or does not improve Any new or concerning symptoms.  Please read the additional information packets attached to your discharge summary.

## 2019-01-19 NOTE — ED Triage Notes (Signed)
Patient presents with sciatica that she has been dealing with since February. Patient states she has been seen multiple times by her doctors and treatment has failed. Patient able to bear weight to get into bed. Patient states she got a toradol shot on 3/2 and a kenalog shot by her spine doctor on Wednesday, neither of which has helped.

## 2019-01-23 DIAGNOSIS — M5416 Radiculopathy, lumbar region: Secondary | ICD-10-CM | POA: Diagnosis not present

## 2019-01-23 DIAGNOSIS — M5442 Lumbago with sciatica, left side: Secondary | ICD-10-CM | POA: Diagnosis not present

## 2019-01-23 DIAGNOSIS — G35 Multiple sclerosis: Secondary | ICD-10-CM | POA: Diagnosis not present

## 2019-01-23 DIAGNOSIS — Z6841 Body Mass Index (BMI) 40.0 and over, adult: Secondary | ICD-10-CM | POA: Diagnosis not present

## 2019-01-23 DIAGNOSIS — I10 Essential (primary) hypertension: Secondary | ICD-10-CM | POA: Diagnosis not present

## 2019-01-23 DIAGNOSIS — M5137 Other intervertebral disc degeneration, lumbosacral region: Secondary | ICD-10-CM | POA: Diagnosis not present

## 2019-01-23 DIAGNOSIS — M47817 Spondylosis without myelopathy or radiculopathy, lumbosacral region: Secondary | ICD-10-CM | POA: Diagnosis not present

## 2019-01-23 DIAGNOSIS — F4323 Adjustment disorder with mixed anxiety and depressed mood: Secondary | ICD-10-CM | POA: Diagnosis not present

## 2019-01-25 DIAGNOSIS — R262 Difficulty in walking, not elsewhere classified: Secondary | ICD-10-CM | POA: Diagnosis not present

## 2019-01-25 DIAGNOSIS — M545 Low back pain: Secondary | ICD-10-CM | POA: Diagnosis not present

## 2019-01-25 DIAGNOSIS — G5702 Lesion of sciatic nerve, left lower limb: Secondary | ICD-10-CM | POA: Diagnosis not present

## 2019-01-25 DIAGNOSIS — M2560 Stiffness of unspecified joint, not elsewhere classified: Secondary | ICD-10-CM | POA: Diagnosis not present

## 2019-01-29 DIAGNOSIS — G5702 Lesion of sciatic nerve, left lower limb: Secondary | ICD-10-CM | POA: Diagnosis not present

## 2019-01-29 DIAGNOSIS — M2560 Stiffness of unspecified joint, not elsewhere classified: Secondary | ICD-10-CM | POA: Diagnosis not present

## 2019-01-29 DIAGNOSIS — M545 Low back pain: Secondary | ICD-10-CM | POA: Diagnosis not present

## 2019-01-29 DIAGNOSIS — R262 Difficulty in walking, not elsewhere classified: Secondary | ICD-10-CM | POA: Diagnosis not present

## 2019-01-30 DIAGNOSIS — M5136 Other intervertebral disc degeneration, lumbar region: Secondary | ICD-10-CM | POA: Diagnosis not present

## 2019-01-30 DIAGNOSIS — G5702 Lesion of sciatic nerve, left lower limb: Secondary | ICD-10-CM | POA: Diagnosis not present

## 2019-01-30 DIAGNOSIS — M545 Low back pain: Secondary | ICD-10-CM | POA: Diagnosis not present

## 2019-01-30 DIAGNOSIS — R262 Difficulty in walking, not elsewhere classified: Secondary | ICD-10-CM | POA: Diagnosis not present

## 2019-01-30 DIAGNOSIS — I10 Essential (primary) hypertension: Secondary | ICD-10-CM | POA: Diagnosis not present

## 2019-01-30 DIAGNOSIS — M2560 Stiffness of unspecified joint, not elsewhere classified: Secondary | ICD-10-CM | POA: Diagnosis not present

## 2019-01-30 DIAGNOSIS — Z6841 Body Mass Index (BMI) 40.0 and over, adult: Secondary | ICD-10-CM | POA: Diagnosis not present

## 2019-01-31 NOTE — ED Notes (Signed)
toradol 60mg  given IM prior to discharge

## 2019-02-04 DIAGNOSIS — M5442 Lumbago with sciatica, left side: Secondary | ICD-10-CM | POA: Diagnosis not present

## 2019-02-04 DIAGNOSIS — M47817 Spondylosis without myelopathy or radiculopathy, lumbosacral region: Secondary | ICD-10-CM | POA: Diagnosis not present

## 2019-02-04 DIAGNOSIS — M5137 Other intervertebral disc degeneration, lumbosacral region: Secondary | ICD-10-CM | POA: Diagnosis not present

## 2019-02-05 DIAGNOSIS — M2560 Stiffness of unspecified joint, not elsewhere classified: Secondary | ICD-10-CM | POA: Diagnosis not present

## 2019-02-05 DIAGNOSIS — R262 Difficulty in walking, not elsewhere classified: Secondary | ICD-10-CM | POA: Diagnosis not present

## 2019-02-05 DIAGNOSIS — G5702 Lesion of sciatic nerve, left lower limb: Secondary | ICD-10-CM | POA: Diagnosis not present

## 2019-02-05 DIAGNOSIS — M545 Low back pain: Secondary | ICD-10-CM | POA: Diagnosis not present

## 2019-02-06 ENCOUNTER — Telehealth: Payer: Self-pay | Admitting: Diagnostic Neuroimaging

## 2019-02-06 ENCOUNTER — Encounter: Payer: Self-pay | Admitting: *Deleted

## 2019-02-06 MED ORDER — PREDNISONE 10 MG PO TABS: ORAL_TABLET | ORAL | 0 refills | Status: DC

## 2019-02-06 NOTE — Telephone Encounter (Signed)
Called patient and informed her Dr Leta Baptist would like her to refer to pain management for medications and further management of her pain. If she feels she cannot get through this period, Dr Leta Baptist will prescribe one more prednisone course. The patient described again what she has been going through and feeling. I discussed pain management's role and the benefits of having all her chronic pain managed with one provider who specializes in many methods to manage pain. She asked if she shouldn't call here again for pain. I advised she call pain management first to discuss. If the provider feels it is not related to her chronic issues, she should call us. I encouraged her to contact us at any time though. I emphasized again the benefits of having chronic pain managed by one provider. She did ask for the steroid ot be prescribed, and stated she'd contact pain management for any further issues.

## 2019-02-06 NOTE — Telephone Encounter (Signed)
Called patient to discuss. She had sent my chart message yesterday with same issues. I apologized for not getting back with her. She verbalized understanding considering Covid 19 events. She stated she saw Pain management and has received 2 injections in back, the 2nd injection helped. She is doing  PT for her back, legs. She stated what she is experiencing now is joint pain and burning, and she can hardly walk. She stated it is weather related, and in the past Dr Leta Baptist has given her steroids for her joint pain flare ups. Her PCP increased gabapentin to 600 mg twice daily, but it is not helping. The narcotics she has taken for back pain in the past do not relieve her joint pain, only steroids do. I advised will let Dr Leta Baptist know. She verbalized understanding, appreciation.

## 2019-02-06 NOTE — Telephone Encounter (Signed)
Patient called in and stated she was in severe pain and needs to know what cn be done .Please advise

## 2019-02-06 NOTE — Telephone Encounter (Signed)
Meds ordered this encounter  Medications  . predniSONE (DELTASONE) 10 MG tablet    Sig: Take 60mg  on day 1. Reduce by 10mg  each subsequent day. (60, 50, 40, 30, 20, 10, stop)    Dispense:  21 tablet    Refill:  0   Penni Bombard, MD 2/77/8242, 3:53 PM Certified in Neurology, Neurophysiology and [facility] [address] [place], [place] [address] [phone]

## 2019-02-11 DIAGNOSIS — G35 Multiple sclerosis: Secondary | ICD-10-CM | POA: Diagnosis not present

## 2019-02-11 DIAGNOSIS — M5442 Lumbago with sciatica, left side: Secondary | ICD-10-CM | POA: Diagnosis not present

## 2019-02-12 DIAGNOSIS — R262 Difficulty in walking, not elsewhere classified: Secondary | ICD-10-CM | POA: Diagnosis not present

## 2019-02-12 DIAGNOSIS — M545 Low back pain: Secondary | ICD-10-CM | POA: Diagnosis not present

## 2019-02-12 DIAGNOSIS — G5702 Lesion of sciatic nerve, left lower limb: Secondary | ICD-10-CM | POA: Diagnosis not present

## 2019-02-12 DIAGNOSIS — M2560 Stiffness of unspecified joint, not elsewhere classified: Secondary | ICD-10-CM | POA: Diagnosis not present

## 2019-02-14 DIAGNOSIS — M545 Low back pain: Secondary | ICD-10-CM | POA: Diagnosis not present

## 2019-02-14 DIAGNOSIS — G5702 Lesion of sciatic nerve, left lower limb: Secondary | ICD-10-CM | POA: Diagnosis not present

## 2019-02-14 DIAGNOSIS — R262 Difficulty in walking, not elsewhere classified: Secondary | ICD-10-CM | POA: Diagnosis not present

## 2019-02-14 DIAGNOSIS — M2560 Stiffness of unspecified joint, not elsewhere classified: Secondary | ICD-10-CM | POA: Diagnosis not present

## 2019-02-19 DIAGNOSIS — G5702 Lesion of sciatic nerve, left lower limb: Secondary | ICD-10-CM | POA: Diagnosis not present

## 2019-02-19 DIAGNOSIS — M545 Low back pain: Secondary | ICD-10-CM | POA: Diagnosis not present

## 2019-02-19 DIAGNOSIS — R262 Difficulty in walking, not elsewhere classified: Secondary | ICD-10-CM | POA: Diagnosis not present

## 2019-02-19 DIAGNOSIS — M2560 Stiffness of unspecified joint, not elsewhere classified: Secondary | ICD-10-CM | POA: Diagnosis not present

## 2019-02-20 DIAGNOSIS — G5702 Lesion of sciatic nerve, left lower limb: Secondary | ICD-10-CM | POA: Diagnosis not present

## 2019-02-20 DIAGNOSIS — M545 Low back pain: Secondary | ICD-10-CM | POA: Diagnosis not present

## 2019-02-20 DIAGNOSIS — R262 Difficulty in walking, not elsewhere classified: Secondary | ICD-10-CM | POA: Diagnosis not present

## 2019-02-20 DIAGNOSIS — M2560 Stiffness of unspecified joint, not elsewhere classified: Secondary | ICD-10-CM | POA: Diagnosis not present

## 2019-02-26 DIAGNOSIS — M5442 Lumbago with sciatica, left side: Secondary | ICD-10-CM | POA: Diagnosis not present

## 2019-02-26 DIAGNOSIS — M5416 Radiculopathy, lumbar region: Secondary | ICD-10-CM | POA: Diagnosis not present

## 2019-02-27 ENCOUNTER — Other Ambulatory Visit: Payer: Self-pay | Admitting: Anesthesiology

## 2019-02-27 DIAGNOSIS — M5416 Radiculopathy, lumbar region: Secondary | ICD-10-CM

## 2019-03-01 DIAGNOSIS — R202 Paresthesia of skin: Secondary | ICD-10-CM | POA: Diagnosis not present

## 2019-03-01 DIAGNOSIS — M5416 Radiculopathy, lumbar region: Secondary | ICD-10-CM | POA: Diagnosis not present

## 2019-03-01 DIAGNOSIS — M5126 Other intervertebral disc displacement, lumbar region: Secondary | ICD-10-CM | POA: Diagnosis not present

## 2019-03-01 DIAGNOSIS — M79605 Pain in left leg: Secondary | ICD-10-CM | POA: Diagnosis not present

## 2019-03-19 DIAGNOSIS — M47816 Spondylosis without myelopathy or radiculopathy, lumbar region: Secondary | ICD-10-CM | POA: Diagnosis not present

## 2019-03-19 DIAGNOSIS — M5136 Other intervertebral disc degeneration, lumbar region: Secondary | ICD-10-CM | POA: Diagnosis not present

## 2019-03-20 ENCOUNTER — Telehealth: Payer: Self-pay | Admitting: *Deleted

## 2019-03-20 DIAGNOSIS — F321 Major depressive disorder, single episode, moderate: Secondary | ICD-10-CM | POA: Diagnosis not present

## 2019-03-20 DIAGNOSIS — M5137 Other intervertebral disc degeneration, lumbosacral region: Secondary | ICD-10-CM | POA: Diagnosis not present

## 2019-03-20 DIAGNOSIS — M47817 Spondylosis without myelopathy or radiculopathy, lumbosacral region: Secondary | ICD-10-CM | POA: Diagnosis not present

## 2019-03-20 DIAGNOSIS — M5442 Lumbago with sciatica, left side: Secondary | ICD-10-CM | POA: Diagnosis not present

## 2019-03-20 NOTE — Telephone Encounter (Signed)
Called patient and advised her that due to current COVID 19 pandemic, our office is severely reducing in person visits in order to minimize the risk to our patients and healthcare providers. We recommend to convert your appointment to a video visit. We'll take all precautions to reduce any security or privacy concerns. This will be treated like an office visit, and we will file with your insurance. She consented to video visit, gave daughter's e mail: [email or web address]. Rescheduled FU sooner and updated EMR. Patient verbalized understanding, appreciation.  E mail sent.

## 2019-03-21 ENCOUNTER — Other Ambulatory Visit: Payer: Self-pay

## 2019-03-21 ENCOUNTER — Ambulatory Visit (INDEPENDENT_AMBULATORY_CARE_PROVIDER_SITE_OTHER): Payer: 59 | Admitting: Diagnostic Neuroimaging

## 2019-03-21 DIAGNOSIS — M79605 Pain in left leg: Secondary | ICD-10-CM | POA: Diagnosis not present

## 2019-03-21 DIAGNOSIS — G35 Multiple sclerosis: Secondary | ICD-10-CM | POA: Diagnosis not present

## 2019-03-21 DIAGNOSIS — G43009 Migraine without aura, not intractable, without status migrainosus: Secondary | ICD-10-CM | POA: Diagnosis not present

## 2019-03-21 DIAGNOSIS — Z79899 Other long term (current) drug therapy: Secondary | ICD-10-CM

## 2019-03-21 NOTE — Progress Notes (Signed)
    Virtual Visit via Video Note  I connected with [name] on 03/21/19 at  9:00 AM EDT by a video enabled telemedicine application and verified that I am speaking with the correct person using two identifiers.   I discussed the limitations of evaluation and management by telemedicine and the availability of in person appointments. The patient expressed understanding and agreed to proceed.  Patient is at their home. I am at the office.    History of Present Illness:  - MS sxs are stable; tolerating ocrevus - left sciatica pain is stable; continues to struggle with pain mgmt; has tried [facility] without relief; planning to see a new neurosurgery / pain clinic - more depression related to pain - has been out of work since Feb 2020    Observations/Objective:  VIDEO EXAM  GENERAL EXAM/CONSTITUTIONAL:  Vitals: There were no vitals filed for this visit.  There is no height or weight on file to calculate BMI. Wt Readings from Last 3 Encounters:  01/19/19 295 lb (133.8 kg)  01/06/19 295 lb (133.8 kg)  12/25/18 298 lb 3.2 oz (135.3 kg)     Patient is in no distress; well developed, nourished and groomed; neck is supple   NEUROLOGIC: MENTAL STATUS:  No flowsheet data found.  awake, alert, oriented to person, place and time  recent and remote memory intact  normal attention and concentration  language fluent, comprehension intact, naming intact  fund of knowledge appropriate  CRANIAL NERVE:   2nd, 3rd, 4th, 6th - visual fields full to confrontation, extraocular muscles intact, no nystagmus  5th - facial sensation symmetric  7th - facial strength symmetric  8th - hearing intact  11th - shoulder shrug symmetric  12th - tongue protrusion midline  MOTOR:   NO TREMOR; NO DRIFT IN BUE  SENSORY:   normal and symmetric to light touch  COORDINATION:   fine finger movements normal   Assessment and Plan:  48 y.o. female with:   Dx:  1. MS (multiple  sclerosis) ([place])   2. Left leg pain   3. Migraine without aura and without status migrainosus, not intractable   4. High risk medication use      MULTIPLE SCLEROSIS (established, worsening) - continue ocrevus (next infusion June 2020); reviewed cautions with COVID 19 and ocrevus - continue exercises and PT - continue vitamin D - monitor CBC, CMP  LEFT LEG PAIN (persistent; sciatica; ? Piriformis syndrome) - follow up with pain mgmt clinic  OBESITY (established, not improving) - follow up with medical weight mgmt or bariatric surgery clinic (wait until 2021; due to current COVID 19 emergency)  MIGRAINE HEADACHES (established, stable) - monitor for now  MENINGIOMA (small, asymptomatic) - incidental finding; reassured patient   Follow Up Instructions:  - Return in about 4 months (around 07/22/2019).    I discussed the assessment and treatment plan with the patient. The patient was provided an opportunity to ask questions and all were answered. The patient agreed with the plan and demonstrated an understanding of the instructions.   The patient was advised to call back or seek an in-person evaluation if the symptoms worsen or if the condition fails to improve as anticipated.  I provided 30 minutes of non-face-to-face time during this encounter.   Penni Bombard, MD 3/0/0762, 2:63 AM Certified in Neurology, Neurophysiology and Neuroimaging  [facility] [address], [place], [place] [address] [phone]

## 2019-04-05 DIAGNOSIS — G4700 Insomnia, unspecified: Secondary | ICD-10-CM | POA: Diagnosis not present

## 2019-04-05 DIAGNOSIS — F4323 Adjustment disorder with mixed anxiety and depressed mood: Secondary | ICD-10-CM | POA: Diagnosis not present

## 2019-04-05 DIAGNOSIS — M5416 Radiculopathy, lumbar region: Secondary | ICD-10-CM | POA: Diagnosis not present

## 2019-04-05 DIAGNOSIS — I10 Essential (primary) hypertension: Secondary | ICD-10-CM | POA: Diagnosis not present

## 2019-04-05 DIAGNOSIS — Z79899 Other long term (current) drug therapy: Secondary | ICD-10-CM | POA: Diagnosis not present

## 2019-04-05 DIAGNOSIS — G35 Multiple sclerosis: Secondary | ICD-10-CM | POA: Diagnosis not present

## 2019-04-16 ENCOUNTER — Ambulatory Visit: Payer: 59 | Admitting: Diagnostic Neuroimaging

## 2019-04-16 DIAGNOSIS — G4733 Obstructive sleep apnea (adult) (pediatric): Secondary | ICD-10-CM | POA: Diagnosis not present

## 2019-04-22 DIAGNOSIS — M5126 Other intervertebral disc displacement, lumbar region: Secondary | ICD-10-CM | POA: Diagnosis not present

## 2019-04-22 DIAGNOSIS — M5416 Radiculopathy, lumbar region: Secondary | ICD-10-CM | POA: Diagnosis not present

## 2019-05-01 DIAGNOSIS — M4726 Other spondylosis with radiculopathy, lumbar region: Secondary | ICD-10-CM | POA: Diagnosis not present

## 2019-05-01 DIAGNOSIS — M5136 Other intervertebral disc degeneration, lumbar region: Secondary | ICD-10-CM | POA: Diagnosis not present

## 2019-05-21 DIAGNOSIS — M5416 Radiculopathy, lumbar region: Secondary | ICD-10-CM | POA: Diagnosis not present

## 2019-05-28 DIAGNOSIS — I10 Essential (primary) hypertension: Secondary | ICD-10-CM | POA: Diagnosis not present

## 2019-05-28 DIAGNOSIS — Z0000 Encounter for general adult medical examination without abnormal findings: Secondary | ICD-10-CM | POA: Diagnosis not present

## 2019-05-28 DIAGNOSIS — Z113 Encounter for screening for infections with a predominantly sexual mode of transmission: Secondary | ICD-10-CM | POA: Diagnosis not present

## 2019-05-28 DIAGNOSIS — Z1231 Encounter for screening mammogram for malignant neoplasm of breast: Secondary | ICD-10-CM | POA: Diagnosis not present

## 2019-05-30 ENCOUNTER — Other Ambulatory Visit: Payer: Self-pay | Admitting: Physician Assistant

## 2019-05-30 DIAGNOSIS — Z1231 Encounter for screening mammogram for malignant neoplasm of breast: Secondary | ICD-10-CM

## 2019-06-04 DIAGNOSIS — M5416 Radiculopathy, lumbar region: Secondary | ICD-10-CM | POA: Diagnosis not present

## 2019-06-04 DIAGNOSIS — M5126 Other intervertebral disc displacement, lumbar region: Secondary | ICD-10-CM | POA: Diagnosis not present

## 2019-06-04 DIAGNOSIS — G894 Chronic pain syndrome: Secondary | ICD-10-CM | POA: Diagnosis not present

## 2019-06-04 DIAGNOSIS — G5702 Lesion of sciatic nerve, left lower limb: Secondary | ICD-10-CM | POA: Diagnosis not present

## 2019-06-04 DIAGNOSIS — M47816 Spondylosis without myelopathy or radiculopathy, lumbar region: Secondary | ICD-10-CM | POA: Diagnosis not present

## 2019-06-06 ENCOUNTER — Telehealth: Payer: Self-pay | Admitting: *Deleted

## 2019-06-06 NOTE — Telephone Encounter (Signed)
Received fax from [facility]: Ocrevus 600 mg approved every 6 months, 05/24/2019 - 05/23/2020, auth # [id number]. Approval given to [employer], infusion suite.

## 2019-06-12 DIAGNOSIS — G35 Multiple sclerosis: Secondary | ICD-10-CM | POA: Diagnosis not present

## 2019-06-21 DIAGNOSIS — M5126 Other intervertebral disc displacement, lumbar region: Secondary | ICD-10-CM | POA: Diagnosis not present

## 2019-06-21 DIAGNOSIS — G5702 Lesion of sciatic nerve, left lower limb: Secondary | ICD-10-CM | POA: Diagnosis not present

## 2019-06-21 DIAGNOSIS — M5416 Radiculopathy, lumbar region: Secondary | ICD-10-CM | POA: Diagnosis not present

## 2019-06-28 ENCOUNTER — Telehealth: Payer: Self-pay | Admitting: Family Medicine

## 2019-06-28 NOTE — Telephone Encounter (Signed)
[facility] re-sent a fax for CPAP supplies. pprwrk in provider's box at nurses station.

## 2019-06-28 NOTE — Telephone Encounter (Signed)
Fax received from [place], [place] for provider to complete/sign orders.  This is not our pt.  Call to pt to confirm she is no longer at this clinic.  Call to new provider, [place].  Advised of faxes and provided phone/fax number for [facility]. Faxes discarded.

## 2019-06-28 NOTE — Telephone Encounter (Signed)
Copied from [place] [phone]. Topic: General - Inquiry >> Jun 28, 2019  9:07 AM Scherrie Gerlach wrote: Reason for CRM: [facility] calling to advise they need Dr Nolon Rod signature on the pt's CPAP, initial and date beside the dr's information.  They sent fax on 06/23/19 and have not received back.  They are going to re fax now.

## 2019-07-09 DIAGNOSIS — I10 Essential (primary) hypertension: Secondary | ICD-10-CM | POA: Diagnosis not present

## 2019-07-09 DIAGNOSIS — M5116 Intervertebral disc disorders with radiculopathy, lumbar region: Secondary | ICD-10-CM | POA: Diagnosis not present

## 2019-07-09 DIAGNOSIS — F4323 Adjustment disorder with mixed anxiety and depressed mood: Secondary | ICD-10-CM | POA: Diagnosis not present

## 2019-07-09 DIAGNOSIS — G35 Multiple sclerosis: Secondary | ICD-10-CM | POA: Diagnosis not present

## 2019-07-09 DIAGNOSIS — M5137 Other intervertebral disc degeneration, lumbosacral region: Secondary | ICD-10-CM | POA: Diagnosis not present

## 2019-07-09 DIAGNOSIS — M47817 Spondylosis without myelopathy or radiculopathy, lumbosacral region: Secondary | ICD-10-CM | POA: Diagnosis not present

## 2019-07-17 ENCOUNTER — Ambulatory Visit
Admission: RE | Admit: 2019-07-17 | Discharge: 2019-07-17 | Disposition: A | Discharge status: Home / Self Care | Payer: 59 | Source: Ambulatory Visit | Attending: Physician Assistant | Admitting: Physician Assistant

## 2019-07-17 ENCOUNTER — Other Ambulatory Visit: Payer: Self-pay

## 2019-07-17 DIAGNOSIS — Z1231 Encounter for screening mammogram for malignant neoplasm of breast: Secondary | ICD-10-CM | POA: Diagnosis not present

## 2019-07-30 DIAGNOSIS — M5116 Intervertebral disc disorders with radiculopathy, lumbar region: Secondary | ICD-10-CM | POA: Diagnosis not present

## 2019-07-30 DIAGNOSIS — Z6841 Body Mass Index (BMI) 40.0 and over, adult: Secondary | ICD-10-CM | POA: Diagnosis not present

## 2019-07-30 DIAGNOSIS — M4726 Other spondylosis with radiculopathy, lumbar region: Secondary | ICD-10-CM | POA: Diagnosis not present

## 2019-07-30 DIAGNOSIS — G894 Chronic pain syndrome: Secondary | ICD-10-CM | POA: Diagnosis not present

## 2019-07-30 DIAGNOSIS — M5136 Other intervertebral disc degeneration, lumbar region: Secondary | ICD-10-CM | POA: Diagnosis not present

## 2019-08-15 DIAGNOSIS — G4733 Obstructive sleep apnea (adult) (pediatric): Secondary | ICD-10-CM | POA: Diagnosis not present

## 2019-08-20 DIAGNOSIS — R05 Cough: Secondary | ICD-10-CM | POA: Diagnosis not present

## 2019-08-27 DIAGNOSIS — G43009 Migraine without aura, not intractable, without status migrainosus: Secondary | ICD-10-CM | POA: Diagnosis not present

## 2019-08-27 DIAGNOSIS — Z13228 Encounter for screening for other metabolic disorders: Secondary | ICD-10-CM | POA: Diagnosis not present

## 2019-08-27 DIAGNOSIS — I10 Essential (primary) hypertension: Secondary | ICD-10-CM | POA: Diagnosis not present

## 2019-08-27 DIAGNOSIS — Z1383 Encounter for screening for respiratory disorder NEC: Secondary | ICD-10-CM | POA: Diagnosis not present

## 2019-08-27 DIAGNOSIS — M47817 Spondylosis without myelopathy or radiculopathy, lumbosacral region: Secondary | ICD-10-CM | POA: Diagnosis not present

## 2019-08-27 DIAGNOSIS — M5137 Other intervertebral disc degeneration, lumbosacral region: Secondary | ICD-10-CM | POA: Diagnosis not present

## 2019-08-27 DIAGNOSIS — Z87891 Personal history of nicotine dependence: Secondary | ICD-10-CM | POA: Diagnosis not present

## 2019-08-27 DIAGNOSIS — Z6841 Body Mass Index (BMI) 40.0 and over, adult: Secondary | ICD-10-CM | POA: Diagnosis not present

## 2019-08-27 DIAGNOSIS — Z1331 Encounter for screening for depression: Secondary | ICD-10-CM | POA: Diagnosis not present

## 2019-08-27 DIAGNOSIS — R7303 Prediabetes: Secondary | ICD-10-CM | POA: Diagnosis not present

## 2019-08-27 DIAGNOSIS — G4733 Obstructive sleep apnea (adult) (pediatric): Secondary | ICD-10-CM | POA: Diagnosis not present

## 2019-08-28 DIAGNOSIS — M5442 Lumbago with sciatica, left side: Secondary | ICD-10-CM | POA: Diagnosis not present

## 2019-08-28 DIAGNOSIS — Z23 Encounter for immunization: Secondary | ICD-10-CM | POA: Diagnosis not present

## 2019-08-28 DIAGNOSIS — G35 Multiple sclerosis: Secondary | ICD-10-CM | POA: Diagnosis not present

## 2019-08-28 DIAGNOSIS — Z6841 Body Mass Index (BMI) 40.0 and over, adult: Secondary | ICD-10-CM | POA: Diagnosis not present

## 2019-08-29 DIAGNOSIS — M5136 Other intervertebral disc degeneration, lumbar region: Secondary | ICD-10-CM | POA: Diagnosis not present

## 2019-08-29 DIAGNOSIS — M25552 Pain in left hip: Secondary | ICD-10-CM | POA: Diagnosis not present

## 2019-08-29 DIAGNOSIS — M9903 Segmental and somatic dysfunction of lumbar region: Secondary | ICD-10-CM | POA: Diagnosis not present

## 2019-08-29 DIAGNOSIS — M9905 Segmental and somatic dysfunction of pelvic region: Secondary | ICD-10-CM | POA: Diagnosis not present

## 2019-09-02 DIAGNOSIS — Z7182 Exercise counseling: Secondary | ICD-10-CM | POA: Diagnosis not present

## 2019-09-02 DIAGNOSIS — I10 Essential (primary) hypertension: Secondary | ICD-10-CM | POA: Diagnosis not present

## 2019-09-02 DIAGNOSIS — Z713 Dietary counseling and surveillance: Secondary | ICD-10-CM | POA: Diagnosis not present

## 2019-09-02 DIAGNOSIS — Z6841 Body Mass Index (BMI) 40.0 and over, adult: Secondary | ICD-10-CM | POA: Diagnosis not present

## 2019-09-04 DIAGNOSIS — M9903 Segmental and somatic dysfunction of lumbar region: Secondary | ICD-10-CM | POA: Diagnosis not present

## 2019-09-04 DIAGNOSIS — M5136 Other intervertebral disc degeneration, lumbar region: Secondary | ICD-10-CM | POA: Diagnosis not present

## 2019-09-04 DIAGNOSIS — M25552 Pain in left hip: Secondary | ICD-10-CM | POA: Diagnosis not present

## 2019-09-04 DIAGNOSIS — M9905 Segmental and somatic dysfunction of pelvic region: Secondary | ICD-10-CM | POA: Diagnosis not present

## 2019-09-05 DIAGNOSIS — M25552 Pain in left hip: Secondary | ICD-10-CM | POA: Diagnosis not present

## 2019-09-05 DIAGNOSIS — M9903 Segmental and somatic dysfunction of lumbar region: Secondary | ICD-10-CM | POA: Diagnosis not present

## 2019-09-05 DIAGNOSIS — M5136 Other intervertebral disc degeneration, lumbar region: Secondary | ICD-10-CM | POA: Diagnosis not present

## 2019-09-05 DIAGNOSIS — M9905 Segmental and somatic dysfunction of pelvic region: Secondary | ICD-10-CM | POA: Diagnosis not present

## 2019-09-09 DIAGNOSIS — Z6841 Body Mass Index (BMI) 40.0 and over, adult: Secondary | ICD-10-CM | POA: Diagnosis not present

## 2019-09-09 DIAGNOSIS — Z7182 Exercise counseling: Secondary | ICD-10-CM | POA: Diagnosis not present

## 2019-09-09 DIAGNOSIS — M5136 Other intervertebral disc degeneration, lumbar region: Secondary | ICD-10-CM | POA: Diagnosis not present

## 2019-09-09 DIAGNOSIS — M25552 Pain in left hip: Secondary | ICD-10-CM | POA: Diagnosis not present

## 2019-09-09 DIAGNOSIS — M9903 Segmental and somatic dysfunction of lumbar region: Secondary | ICD-10-CM | POA: Diagnosis not present

## 2019-09-09 DIAGNOSIS — M5442 Lumbago with sciatica, left side: Secondary | ICD-10-CM | POA: Diagnosis not present

## 2019-09-09 DIAGNOSIS — M9905 Segmental and somatic dysfunction of pelvic region: Secondary | ICD-10-CM | POA: Diagnosis not present

## 2019-09-09 DIAGNOSIS — Z713 Dietary counseling and surveillance: Secondary | ICD-10-CM | POA: Diagnosis not present

## 2019-09-10 DIAGNOSIS — M5136 Other intervertebral disc degeneration, lumbar region: Secondary | ICD-10-CM | POA: Diagnosis not present

## 2019-09-10 DIAGNOSIS — M9903 Segmental and somatic dysfunction of lumbar region: Secondary | ICD-10-CM | POA: Diagnosis not present

## 2019-09-10 DIAGNOSIS — M25552 Pain in left hip: Secondary | ICD-10-CM | POA: Diagnosis not present

## 2019-09-10 DIAGNOSIS — M9905 Segmental and somatic dysfunction of pelvic region: Secondary | ICD-10-CM | POA: Diagnosis not present

## 2019-09-11 DIAGNOSIS — M9905 Segmental and somatic dysfunction of pelvic region: Secondary | ICD-10-CM | POA: Diagnosis not present

## 2019-09-11 DIAGNOSIS — M9903 Segmental and somatic dysfunction of lumbar region: Secondary | ICD-10-CM | POA: Diagnosis not present

## 2019-09-11 DIAGNOSIS — M25552 Pain in left hip: Secondary | ICD-10-CM | POA: Diagnosis not present

## 2019-09-11 DIAGNOSIS — M5136 Other intervertebral disc degeneration, lumbar region: Secondary | ICD-10-CM | POA: Diagnosis not present

## 2019-09-17 DIAGNOSIS — M9905 Segmental and somatic dysfunction of pelvic region: Secondary | ICD-10-CM | POA: Diagnosis not present

## 2019-09-17 DIAGNOSIS — M9903 Segmental and somatic dysfunction of lumbar region: Secondary | ICD-10-CM | POA: Diagnosis not present

## 2019-09-17 DIAGNOSIS — M25552 Pain in left hip: Secondary | ICD-10-CM | POA: Diagnosis not present

## 2019-09-17 DIAGNOSIS — M5136 Other intervertebral disc degeneration, lumbar region: Secondary | ICD-10-CM | POA: Diagnosis not present

## 2019-09-18 DIAGNOSIS — M9903 Segmental and somatic dysfunction of lumbar region: Secondary | ICD-10-CM | POA: Diagnosis not present

## 2019-09-18 DIAGNOSIS — Z713 Dietary counseling and surveillance: Secondary | ICD-10-CM | POA: Diagnosis not present

## 2019-09-18 DIAGNOSIS — Z6841 Body Mass Index (BMI) 40.0 and over, adult: Secondary | ICD-10-CM | POA: Diagnosis not present

## 2019-09-18 DIAGNOSIS — M9905 Segmental and somatic dysfunction of pelvic region: Secondary | ICD-10-CM | POA: Diagnosis not present

## 2019-09-18 DIAGNOSIS — M25552 Pain in left hip: Secondary | ICD-10-CM | POA: Diagnosis not present

## 2019-09-18 DIAGNOSIS — R7303 Prediabetes: Secondary | ICD-10-CM | POA: Diagnosis not present

## 2019-09-18 DIAGNOSIS — Z7182 Exercise counseling: Secondary | ICD-10-CM | POA: Diagnosis not present

## 2019-09-18 DIAGNOSIS — M5136 Other intervertebral disc degeneration, lumbar region: Secondary | ICD-10-CM | POA: Diagnosis not present

## 2019-09-19 DIAGNOSIS — M25552 Pain in left hip: Secondary | ICD-10-CM | POA: Diagnosis not present

## 2019-09-19 DIAGNOSIS — M9903 Segmental and somatic dysfunction of lumbar region: Secondary | ICD-10-CM | POA: Diagnosis not present

## 2019-09-19 DIAGNOSIS — M9905 Segmental and somatic dysfunction of pelvic region: Secondary | ICD-10-CM | POA: Diagnosis not present

## 2019-09-19 DIAGNOSIS — M5136 Other intervertebral disc degeneration, lumbar region: Secondary | ICD-10-CM | POA: Diagnosis not present

## 2019-09-23 DIAGNOSIS — M9905 Segmental and somatic dysfunction of pelvic region: Secondary | ICD-10-CM | POA: Diagnosis not present

## 2019-09-23 DIAGNOSIS — M9903 Segmental and somatic dysfunction of lumbar region: Secondary | ICD-10-CM | POA: Diagnosis not present

## 2019-09-23 DIAGNOSIS — M25552 Pain in left hip: Secondary | ICD-10-CM | POA: Diagnosis not present

## 2019-09-23 DIAGNOSIS — M5136 Other intervertebral disc degeneration, lumbar region: Secondary | ICD-10-CM | POA: Diagnosis not present

## 2019-09-24 DIAGNOSIS — M9903 Segmental and somatic dysfunction of lumbar region: Secondary | ICD-10-CM | POA: Diagnosis not present

## 2019-09-24 DIAGNOSIS — M25552 Pain in left hip: Secondary | ICD-10-CM | POA: Diagnosis not present

## 2019-09-24 DIAGNOSIS — M5136 Other intervertebral disc degeneration, lumbar region: Secondary | ICD-10-CM | POA: Diagnosis not present

## 2019-09-24 DIAGNOSIS — M9905 Segmental and somatic dysfunction of pelvic region: Secondary | ICD-10-CM | POA: Diagnosis not present

## 2019-09-26 DIAGNOSIS — M9903 Segmental and somatic dysfunction of lumbar region: Secondary | ICD-10-CM | POA: Diagnosis not present

## 2019-09-26 DIAGNOSIS — M5136 Other intervertebral disc degeneration, lumbar region: Secondary | ICD-10-CM | POA: Diagnosis not present

## 2019-09-26 DIAGNOSIS — M9905 Segmental and somatic dysfunction of pelvic region: Secondary | ICD-10-CM | POA: Diagnosis not present

## 2019-09-26 DIAGNOSIS — M25552 Pain in left hip: Secondary | ICD-10-CM | POA: Diagnosis not present

## 2019-10-01 DIAGNOSIS — M25552 Pain in left hip: Secondary | ICD-10-CM | POA: Diagnosis not present

## 2019-10-01 DIAGNOSIS — M9903 Segmental and somatic dysfunction of lumbar region: Secondary | ICD-10-CM | POA: Diagnosis not present

## 2019-10-01 DIAGNOSIS — M5136 Other intervertebral disc degeneration, lumbar region: Secondary | ICD-10-CM | POA: Diagnosis not present

## 2019-10-01 DIAGNOSIS — M9905 Segmental and somatic dysfunction of pelvic region: Secondary | ICD-10-CM | POA: Diagnosis not present

## 2019-10-02 DIAGNOSIS — Z6841 Body Mass Index (BMI) 40.0 and over, adult: Secondary | ICD-10-CM | POA: Diagnosis not present

## 2019-10-02 DIAGNOSIS — Z713 Dietary counseling and surveillance: Secondary | ICD-10-CM | POA: Diagnosis not present

## 2019-10-02 DIAGNOSIS — I10 Essential (primary) hypertension: Secondary | ICD-10-CM | POA: Diagnosis not present

## 2019-10-03 DIAGNOSIS — M9905 Segmental and somatic dysfunction of pelvic region: Secondary | ICD-10-CM | POA: Diagnosis not present

## 2019-10-03 DIAGNOSIS — M25552 Pain in left hip: Secondary | ICD-10-CM | POA: Diagnosis not present

## 2019-10-03 DIAGNOSIS — M9903 Segmental and somatic dysfunction of lumbar region: Secondary | ICD-10-CM | POA: Diagnosis not present

## 2019-10-03 DIAGNOSIS — M5136 Other intervertebral disc degeneration, lumbar region: Secondary | ICD-10-CM | POA: Diagnosis not present

## 2019-10-07 DIAGNOSIS — M9905 Segmental and somatic dysfunction of pelvic region: Secondary | ICD-10-CM | POA: Diagnosis not present

## 2019-10-07 DIAGNOSIS — M9903 Segmental and somatic dysfunction of lumbar region: Secondary | ICD-10-CM | POA: Diagnosis not present

## 2019-10-07 DIAGNOSIS — M5136 Other intervertebral disc degeneration, lumbar region: Secondary | ICD-10-CM | POA: Diagnosis not present

## 2019-10-07 DIAGNOSIS — M25552 Pain in left hip: Secondary | ICD-10-CM | POA: Diagnosis not present

## 2019-10-09 DIAGNOSIS — Z713 Dietary counseling and surveillance: Secondary | ICD-10-CM | POA: Diagnosis not present

## 2019-10-09 DIAGNOSIS — M79605 Pain in left leg: Secondary | ICD-10-CM | POA: Diagnosis not present

## 2019-10-09 DIAGNOSIS — Z6841 Body Mass Index (BMI) 40.0 and over, adult: Secondary | ICD-10-CM | POA: Diagnosis not present

## 2019-10-09 DIAGNOSIS — M545 Low back pain: Secondary | ICD-10-CM | POA: Diagnosis not present

## 2019-10-09 DIAGNOSIS — Z7182 Exercise counseling: Secondary | ICD-10-CM | POA: Diagnosis not present

## 2019-10-14 DIAGNOSIS — M5136 Other intervertebral disc degeneration, lumbar region: Secondary | ICD-10-CM | POA: Diagnosis not present

## 2019-10-14 DIAGNOSIS — M9905 Segmental and somatic dysfunction of pelvic region: Secondary | ICD-10-CM | POA: Diagnosis not present

## 2019-10-14 DIAGNOSIS — M25552 Pain in left hip: Secondary | ICD-10-CM | POA: Diagnosis not present

## 2019-10-14 DIAGNOSIS — M9903 Segmental and somatic dysfunction of lumbar region: Secondary | ICD-10-CM | POA: Diagnosis not present

## 2019-10-23 DIAGNOSIS — I10 Essential (primary) hypertension: Secondary | ICD-10-CM | POA: Diagnosis not present

## 2019-10-23 DIAGNOSIS — Z6841 Body Mass Index (BMI) 40.0 and over, adult: Secondary | ICD-10-CM | POA: Diagnosis not present

## 2019-10-23 DIAGNOSIS — R7303 Prediabetes: Secondary | ICD-10-CM | POA: Diagnosis not present

## 2019-10-23 DIAGNOSIS — Z79899 Other long term (current) drug therapy: Secondary | ICD-10-CM | POA: Diagnosis not present

## 2019-10-23 DIAGNOSIS — N912 Amenorrhea, unspecified: Secondary | ICD-10-CM | POA: Diagnosis not present

## 2019-10-23 DIAGNOSIS — F17200 Nicotine dependence, unspecified, uncomplicated: Secondary | ICD-10-CM | POA: Diagnosis not present

## 2019-10-23 DIAGNOSIS — M5116 Intervertebral disc disorders with radiculopathy, lumbar region: Secondary | ICD-10-CM | POA: Diagnosis not present

## 2019-10-23 DIAGNOSIS — Z1211 Encounter for screening for malignant neoplasm of colon: Secondary | ICD-10-CM | POA: Diagnosis not present

## 2019-10-24 ENCOUNTER — Encounter: Payer: Self-pay | Admitting: Gastroenterology

## 2019-10-29 ENCOUNTER — Other Ambulatory Visit: Payer: Self-pay

## 2019-10-29 ENCOUNTER — Encounter: Payer: Self-pay | Admitting: Diagnostic Neuroimaging

## 2019-10-29 ENCOUNTER — Ambulatory Visit: Payer: 59 | Admitting: Diagnostic Neuroimaging

## 2019-10-29 VITALS — BP 141/95 | HR 86 | Temp 96.8°F | Ht 62.0 in

## 2019-10-29 DIAGNOSIS — G35 Multiple sclerosis: Secondary | ICD-10-CM

## 2019-10-29 DIAGNOSIS — M79605 Pain in left leg: Secondary | ICD-10-CM

## 2019-10-29 NOTE — Progress Notes (Addendum)
Chief Complaint  Patient presents with  . Multiple Sclerosis    rm 6, FU prior to next Ocrevus infusion    History of Present Illness:  - MS sxs are stable; tolerating ocrevus - left sciatica pain is stable; continues to struggle with pain mgmt; has tried [facility] without relief; now seeing chiropractor and doing better - mood, nutrition, exercise are improving - has been out of work since Feb 2020    Observations/Objective:  GENERAL EXAM/CONSTITUTIONAL: Vitals:  Vitals:   10/29/19 0957  BP: (!) 141/95  Pulse: 86  Temp: (!) 96.8 F (36 C)  Height: '5\' 2"'$  (1.575 m)     Body mass index is 53.96 kg/m. Wt Readings from Last 3 Encounters:  01/19/19 295 lb (133.8 kg)  01/06/19 295 lb (133.8 kg)  12/25/18 298 lb 3.2 oz (135.3 kg)     Patient is in no distress; well developed, nourished and groomed; neck is supple  CARDIOVASCULAR:  Examination of carotid arteries is normal; no carotid bruits  Regular rate and rhythm, no murmurs  Examination of peripheral vascular system by observation and palpation is normal  EYES:  Ophthalmoscopic exam of optic discs and posterior segments is normal; no papilledema or hemorrhages  No exam data present  MUSCULOSKELETAL:  Gait, strength, tone, movements noted in Neurologic exam below  NEUROLOGIC: MENTAL STATUS:  No flowsheet data found.  awake, alert, oriented to person, place and time  recent and remote memory intact  normal attention and concentration  language fluent, comprehension intact, naming intact  fund of knowledge appropriate  CRANIAL NERVE:   2nd - no papilledema on fundoscopic exam  2nd, 3rd, 4th, 6th - pupils equal and reactive to light, visual fields full to confrontation, extraocular muscles intact, no nystagmus  5th - facial sensation symmetric  7th - facial strength symmetric  8th - hearing intact  9th - palate elevates symmetrically, uvula midline  11th - shoulder shrug symmetric  12th -  tongue protrusion midline  MOTOR:   normal bulk and tone, full strength in the BUE, BLE  SENSORY:   normal and symmetric to light touch, temperature, vibration  COORDINATION:   finger-nose-finger, fine finger movements normal  REFLEXES:   deep tendon reflexes present and symmetric  GAIT/STATION:   narrow based gait  01/07/19 MRI brain  - No change since September of 2018. Multiple cerebral hemispheric white matter lesions consistent with chronic multiple sclerosis. No new or progressive lesions. No lesions show  restricted diffusion or contrast enhancement.  01/07/19 MRI lumbar spine - L4-5: Bilateral facet arthropathy with gaping, fluid-filled joints. Mild biforaminal disc bulges. No visible neural compression in this position. With standing or flexion, the patient could develop anterolisthesis, which could be significant. Facet arthropathy could certainly be a cause of back pain or referred facet syndrome pain as well. - L5-S1: Small left paracentral disc protrusion which approaches the left S1 nerve but does not visibly compress or displace it. It is possible this could be associated with left S1 nerve irritation. Mild facet osteoarthritis at this level could contribute to back pain.    10/23/19 CBC WBC 9.0 3.4 - 10.8 x10E3/uL [facility]   RBC 5.25 3.77 - 5.28 x10E6/uL [facility]   Hemoglobin 11.3 11.1 - 15.9 g/dL [facility]   Hematocrit 38.4 34.0 - 46.6 % [facility]   MCV 73 (L) 79 - 97 fL [facility]   MCH 21.5 (L) 26.6 - 33.0 pg [facility]   MCHC 29.4 (L) 31.5 -  35.7 g/dL [facility]   RDW 19.4 (H) 11.7 - 15.4 % [facility]   Platelet Count 294 150 - 450 x10E3/uL [facility]   Neutrophils 61 Not Estab. % [facility]   Lymphs Relative  26 Not Estab. % [facility]   Monocytes 7 Not Estab. % [facility]   Eos Relative  5 Not Estab. % [facility]   Basos Relative  1 Not Estab. % [facility]   Neutrophils Absolute 5.5 1.4 - 7.0 x10E3/uL [facility]   Lymphocytes Absolute 2.4 0.7 -  3.1 x10E3/uL [facility]   Monocytes Absolute 0.6 0.1 - 0.9 x10E3/uL [facility]   Eosinophils Absolute 0.5 (H) 0.0 - 0.4 x10E3/uL [facility]   Basophils Absolute 0.1 0.0 - 0.2 x10E3/uL [facility]   Immature Granulocytes 0 Not Estab. % [facility]   Immature Grans (Abs) 0.0 0.0 - 0.1 x10E3/uL [facility]     10/23/19 CMP Glucose 96 65 - 99 mg/dL [facility]   BUN 12 6 - 24 mg/dL [facility]   Creatinine, Serum 0.84 0.57 - 1.00 mg/dL [facility]   eGFR If NonAfrican American 81 >59 mL/min/1.73 [facility]   eGFR If African American 94 >59 mL/min/1.73 [facility]   BUN/Creatinine Ratio 14 9 - 23 [facility]   Sodium 139 134 - 144 mmol/L [facility]   Potassium 4.6 3.5 - 5.2 mmol/L [facility]   Chloride 105 96 - 106 mmol/L [facility]   CO2 21 20 - 29 mmol/L [facility]   CALCIUM 11.3 (H) 8.7 - 10.2 mg/dL [facility]   Total Protein 7.1 6.0 - 8.5 g/dL [facility]   Albumin, Serum 4.4 3.8 - 4.8 g/dL [facility]   Globulin, Total 2.7 1.5 - 4.5 g/dL [facility]   Albumin/Globulin Ratio 1.6 1.2 - 2.2 [facility]   Total Bilirubin <0.2 0.0 - 1.2 mg/dL [facility]   Alkaline Phosphatase 102 39 - 117 IU/L [facility]   AST 30 0 - 40 IU/L [facility]   ALT (SGPT) 37 (H) 0 - 32 IU/L [facility]       Assessment and Plan:  48 y.o. female with:   Dx:  1. MS (multiple sclerosis) ([place])   2. Left leg pain      MULTIPLE SCLEROSIS (relapsing, remitting multiple sclerosis; established, worsening) - continue ocrevus (next infusion June 2020); reviewed cautions with COVID 19 and ocrevus - continue exercises and PT - continue vitamin D - monitor CBC, CMP  LEFT LEG PAIN (persistent; sciatica; ? piriformis syndrome) - follow up with Dr. Jimmye Norman (chiro)  OBESITY (established, improving) - follow up with nutritionist  MIGRAINE HEADACHES (established, stable) - monitor for now  MENINGIOMA (small, asymptomatic) - incidental finding; reassured patient   Follow Up Instructions:  - Return in about 6  months (around 04/28/2020).     Penni Bombard, MD 78/71/8367, 25:50 AM Certified in Neurology, Neurophysiology and Neuroimaging  [facility] [address] [place], [place] [address] [phone]

## 2019-11-06 DIAGNOSIS — Z713 Dietary counseling and surveillance: Secondary | ICD-10-CM | POA: Diagnosis not present

## 2019-11-06 DIAGNOSIS — Z6841 Body Mass Index (BMI) 40.0 and over, adult: Secondary | ICD-10-CM | POA: Diagnosis not present

## 2019-11-06 DIAGNOSIS — M5116 Intervertebral disc disorders with radiculopathy, lumbar region: Secondary | ICD-10-CM | POA: Diagnosis not present

## 2019-11-14 DIAGNOSIS — G4733 Obstructive sleep apnea (adult) (pediatric): Secondary | ICD-10-CM | POA: Diagnosis not present

## 2019-11-20 ENCOUNTER — Encounter: Payer: 59 | Admitting: Gastroenterology

## 2019-11-27 ENCOUNTER — Ambulatory Visit: Payer: 59 | Admitting: Gastroenterology

## 2019-12-03 ENCOUNTER — Ambulatory Visit: Payer: 59 | Admitting: Gastroenterology

## 2019-12-03 ENCOUNTER — Encounter: Payer: Self-pay | Admitting: Gastroenterology

## 2019-12-03 ENCOUNTER — Other Ambulatory Visit (INDEPENDENT_AMBULATORY_CARE_PROVIDER_SITE_OTHER): Payer: 59

## 2019-12-03 VITALS — BP 116/80 | HR 76 | Temp 95.8°F | Ht 63.5 in | Wt 294.8 lb

## 2019-12-03 DIAGNOSIS — D509 Iron deficiency anemia, unspecified: Secondary | ICD-10-CM

## 2019-12-03 DIAGNOSIS — Z1211 Encounter for screening for malignant neoplasm of colon: Secondary | ICD-10-CM

## 2019-12-03 DIAGNOSIS — R12 Heartburn: Secondary | ICD-10-CM | POA: Diagnosis not present

## 2019-12-03 DIAGNOSIS — K5900 Constipation, unspecified: Secondary | ICD-10-CM | POA: Insufficient documentation

## 2019-12-03 LAB — CBC
HCT: 35.5 % — ABNORMAL LOW (ref 36.0–46.0)
Hemoglobin: 11.3 g/dL — ABNORMAL LOW (ref 12.0–15.0)
MCHC: 31.9 g/dL (ref 30.0–36.0)
MCV: 69.3 fl — ABNORMAL LOW (ref 78.0–100.0)
Platelets: 293 10*3/uL (ref 150.0–400.0)
RBC: 5.12 Mil/uL — ABNORMAL HIGH (ref 3.87–5.11)
RDW: 19.9 % — ABNORMAL HIGH (ref 11.5–15.5)
WBC: 10.5 10*3/uL (ref 4.0–10.5)

## 2019-12-03 LAB — IGA: IgA: 260 mg/dL (ref 68–378)

## 2019-12-03 LAB — IBC + FERRITIN
Ferritin: 7.6 ng/mL — ABNORMAL LOW (ref 10.0–291.0)
Iron: 34 ug/dL — ABNORMAL LOW (ref 42–145)
Saturation Ratios: 7.4 % — ABNORMAL LOW (ref 20.0–50.0)
Transferrin: 330 mg/dL (ref 212.0–360.0)

## 2019-12-03 LAB — VITAMIN B12: Vitamin B-12: 449 pg/mL (ref 211–911)

## 2019-12-03 NOTE — Progress Notes (Signed)
[place] VISIT   Primary Care Provider Harrison Mons, [place] [address] [phone]  Referring Provider Harrison Mons, [place],  [place] [address] [phone]  Patient Profile: [name] is a 49 y.o. female with a pmh significant for obesity (BMI greater than 50), multiple sclerosis, migraines, hypertension, MDD/anxiety, sickle cell trait, chronic back pain.  The patient presents to the [facility] for an evaluation and management of problem(s) noted below:  Problem List 1. Colon cancer screening   2. Constipation, unspecified constipation type   3. Microcytic anemia   4. Heartburn     History of Present Illness This is the patient's first visit to the outpatient [place] clinic.  She presents with desire of moving forward with some sort of colon cancer screening.  She states she has dealt with issues of constipation over the course the last few weeks as she has recently been initiated on Ocrelizumab.  During that time she is noting bowel movements being harder and she is at times having to strain.  Patient denies any significant history of rectal bleeding (melena/hematochezia/maroon stools).  Prior to this new medication, she was having bowel movements once to twice daily.  The patient states that she has no family history of GI malignancies.  The patient was told that particular medication causes constipation at times.  Patient states that she has a history of dietary controlled heartburn/pyrosis.  Spicy foods, tomato sauces, onions can cause her symptoms.  However if she does not partake of though she does not have symptoms.  She denies any symptoms of dysphagia or odynophagia.  She does not have a history of prior GI evaluation and has never had an upper or lower endoscopy.  She has a longstanding history of anemia per her report.  She has required oral iron  as well as IV iron in the past.  Unfortunately, she has had an IV iron infusion reaction in the setting of having significant shortness of breath and she feels uncomfortable about having another IV iron infusion.  She has never been told that she has a true thalassemia though she has report of sickle cell trait per notation in the chart.  She continues to have vaginal bleeding as she has not gone through menopause.  She does not feel she is having significant menorrhagia.  GI Review of Systems Positive as above Negative for nausea, vomiting, change in appetite, early satiety, bloating   Review of Systems General: Denies fevers/chills/weight loss HEENT: Denies oral lesions Cardiovascular: Denies chest pain/palpitations Pulmonary: Denies shortness of breath/cough Gastroenterological: See HPI Genitourinary: Denies darkened urine or hematuria Hematological: Denies easy bruising/bleeding Endocrine: Denies temperature intolerance Dermatological: Denies jaundice Psychological: Mood is stable Musculoskeletal: She is on disability due to back issues   Medications Current Outpatient Medications  Medication Sig Dispense Refill  . amLODipine (NORVASC) 5 MG tablet Take 5 mg by mouth daily.    . Naltrexone-buPROPion HCl ER 8-90 MG TB12 as directed.    Wess Botts (OCREVUS IV) Inject into the vein See admin instructions. Every 6 months    . telmisartan-hydrochlorothiazide (MICARDIS HCT) 80-12.5 MG tablet Take 1 tablet by mouth daily. 90 tablet 3   No current facility-administered medications for this visit.    Allergies Allergies  Allergen Reactions  . Iron Shortness Of Breath    IV only   . Tysabri [Natalizumab] Other (See Comments)    Shortness of breath, joint  pain, tremors  . Delsym [Dextromethorphan Polistirex Er] Other (See Comments)    nightmares    Histories Past Medical History:  Diagnosis Date  . Allergy   . Anemia   . Anxiety   . Depression   . Hypertension   .  Migraine   . MS (multiple sclerosis) ([place])   . OSA on CPAP   . Sickle cell trait ([place])   . Smoker   . Tachycardia    Past Surgical History:  Procedure Laterality Date  . CERVICAL ABLATION    . CESAREAN SECTION     3 times  . CHOLECYSTECTOMY    . TUBAL LIGATION     Social History   Socioeconomic History  . Marital status: Divorced    Spouse name: N/A  . Number of children: 3  . Years of education: 12+  . Highest education level: Not on file  Occupational History  . 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, answers calls    Employer: [employer]  Tobacco Use  . Smoking status: Former Smoker    Packs/day: 0.50    Years: 25.00    Pack years: 12.50    Types: Cigarettes    Quit date: 09/16/2018    Years since quitting: 1.2  . Smokeless tobacco: Never Used  . Tobacco comment: 12/25/18  quit 2 months ago  Substance and Sexual Activity  . Alcohol use: Yes    Alcohol/week: 0.0 standard drinks    Comment: once maybe a month if that  . Drug use: No  . Sexual activity: Yes    Partners: Male    Birth control/protection: Condom  Other Topics Concern  . Not on file  Social History Narrative   Patient lives at home with her family.   Working at the call center for [employer].    Social Determinants of Health   Financial Resource Strain:   . Difficulty of Paying Living Expenses: Not on file  Food Insecurity:   . Worried About Charity fundraiser in the Last Year: Not on file  . Ran Out of Food in the Last Year: Not on file  Transportation Needs:   . Lack of Transportation (Medical): Not on file  . Lack of Transportation (Non-Medical): Not on file  Physical Activity:   . Days of Exercise per Week: Not on file  . Minutes of Exercise per Session: Not on file  Stress:   . Feeling of Stress : Not on file  Social Connections:   . Frequency of Communication with Friends and Family: Not on file  . Frequency of Social Gatherings with Friends  and Family: Not on file  . Attends Religious Services: Not on file  . Active Member of Clubs or Organizations: Not on file  . Attends Archivist Meetings: Not on file  . Marital Status: Not on file  Intimate Partner Violence:   . Fear of Current or Ex-Partner: Not on file  . Emotionally Abused: Not on file  . Physically Abused: Not on file  . Sexually Abused: Not on file   Family History  Problem Relation Age of Onset  . Mental illness Mother   . Hyperlipidemia Mother   . Hyperthyroidism Mother   . Mental retardation Mother   . Other Mother        Covid  . ADD / ADHD Son   . Diabetes Father   . Cancer Father   . Cancer Paternal Aunt  breast  . Arthritis Maternal Grandmother   . Diabetes Maternal Grandmother   . Hearing loss Maternal Grandmother        left hear when she was a child  . Hyperlipidemia Maternal Grandmother   . Hypertension Maternal Grandmother   . Alcohol abuse Maternal Grandfather   . Early death Maternal Grandfather   . Cancer Paternal Aunt        breast  . Multiple sclerosis Cousin   . Breast cancer Neg Hx   . Colon cancer Neg Hx   . Esophageal cancer Neg Hx   . Inflammatory bowel disease Neg Hx   . Liver disease Neg Hx   . Pancreatic cancer Neg Hx   . Stomach cancer Neg Hx   . Rectal cancer Neg Hx    I have reviewed her medical, social, and family history in detail and updated the electronic medical record as necessary.    PHYSICAL EXAMINATION  BP 116/80   Pulse 76   Temp (!) 95.8 F (35.4 C)   Ht 5' 3.5" (1.613 m)   Wt 294 lb 12.8 oz (133.7 kg)   LMP 11/29/2019 (Approximate)   BMI 51.40 kg/m  Wt Readings from Last 3 Encounters:  12/03/19 294 lb 12.8 oz (133.7 kg)  01/19/19 295 lb (133.8 kg)  11/28/16 282 lb (127.9 kg)  GEN: NAD, appears stated age, doesn't appear chronically ill PSYCH: Cooperative, without pressured speech EYE: Conjunctivae pink, sclerae anicteric ENT: MMM, without oral ulcers NECK: Supple, enlarged  neck girth CV: RR without R/Gs  RESP: CTAB posteriorly, without wheezing GI: NABS, soft, protuberant abdomen, without rebound or guarding, unable to appreciate hepatosplenomegaly due to body habitus  MSK/EXT: Trace bilateral lower extremity edema SKIN: No jaundice NEURO:  Alert & Oriented x 3, no focal deficits   REVIEW OF DATA  I reviewed the following data at the time of this encounter:  GI Procedures and Studies  No relevant studies to review  Laboratory Studies  Reviewed those in epic and care everywhere December 2020 CBC WBC 9.0 Hemoglobin 11.3 Hematocrit 38.4 MCV 73 Platelet 294  July 2020 CBC Data BC 10.9 Hemoglobin 11.2 Hematocrit 37.8 MCV 69.7 Platelet 329  May 2020 CBC WBC 10.1 Hemoglobin 11.2 Hematocrit 38.5 MCV 69.9 Platelet 300  Imaging Studies  No relevant studies to review   ASSESSMENT  Ms. Scoble is a 49 y.o. female  with a pmh significant for obesity (BMI greater than 50), multiple sclerosis, migraines, hypertension, MDD/anxiety, sickle cell trait, chronic back pain.  The patient is seen today for evaluation and management of:  1. Colon cancer screening   2. Constipation, unspecified constipation type   3. Microcytic anemia   4. Heartburn    The patient is hemodynamically stable.  Clinically she feels well.  Patient presents for evaluation for consideration of colon cancer screening.  We discussed different modalities of colon cancer screening including colonoscopy versus Cologuard versus FIT.  Based on patient's discussion she would like to pursue a colonoscopy.  Due to her multiple medical comorbidities and her BMI being greater than 50, she will have to have a procedure done in the outpatient hospital-based setting.  The risks and benefits of endoscopic evaluation were discussed with the patient; these include but are not limited to the risk of perforation, infection, bleeding, missed lesions, lack of diagnosis, severe illness requiring  hospitalization, as well as anesthesia and sedation related illnesses.  The patient is agreeable to proceed.  The patient has evidence of a microcytic anemia  as well as a documented prior history of sickle cell trait without sickle cell disease.  She has had iron deficiency previously and is still having periods however she is not having overt menorrhagia.  She has not tolerated IV iron in the past.  She has not been on oral iron in years.  I have asked the patient that we obtain repeat blood count as well as iron indices to evaluate for iron deficiency.  If the patient has iron deficiency there is a relative utility on adding an upper endoscopy to evaluate for potential sources of iron deficiency even though she has not had overt GI bleeding.  She has had heartburn symptoms however they are very intermittent and dietarily controlled.  No PPI or H2 RA unless patient is having more symptoms.  We will see what her labs show and then decide on possible upper endoscopy.  As she is currently just a screening procedure, she will be a category 3 for the hospital however if she develops iron deficiency was found to have iron deficiency then she would be moved up to a category 2 diagnostic procedure(s) and could have that done in the coming weeks.  All patient questions were answered, to the best of my ability, and the patient agrees to the aforementioned plan of action with follow-up as indicated.   PLAN  Laboratories as outlined below Plan for screening colonoscopy in the hospital-based setting Patient is found to have iron deficiency proceed with scheduling an upper endoscopy as well Reported history of sickle cell trait which may be contributing to iron deficiency as well as continued vaginal periods but need to keep in mind We will start oral iron if found to have iron deficiency   Orders Placed This Encounter  Procedures  . CBC  . IBC + Ferritin  . B12  . IgA  . Tissue transglutaminase, IgA    New  Prescriptions   No medications on file   Modified Medications   No medications on file    Planned Follow Up No follow-ups on file.   Total Time in Face-to-Face and in Coordination of Care for patient including review/personal interpretation of prior testing, medical history, examination, medication adjustment, documentation with the EHR is 50 minutes.   Justice Britain, MD [facility] Office # [id number]

## 2019-12-03 NOTE — Patient Instructions (Signed)
If you are age 51 or older, your body mass index should be between 23-30. Your Body mass index is 51.4 kg/m. If this is out of the aforementioned range listed, please consider follow up with your Primary Care Provider.  It has been recommended to you by your physician that you have a(n) Colonoscopy at hospital completed.  We did not schedule the procedure(s) today due to Covid -19 restrictions.We will place your on a hospital wait list and contact you once we are able to schedule or if you need for colonoscopy changes to a category 1 or 2. If you have not heard from [place] in 6-8 weeks, please contact office at [phone].   - Trial Fibercon (pill) , Metamucil, Citrucel, or Benefiber- your choice.   A high fiber diet with plenty of fluids (up to 8 glasses of water daily) is suggested to relieve these symptoms.  Metamucil, 1 tablespoon once or twice daily can be used to keep bowels regular if needed.   Your provider has requested that you go to the basement level for lab work before leaving today. Press "B" on the elevator. The lab is located at the first door on the left as you exit the elevator.  Due to recent changes in healthcare laws, you may see the results of your imaging and laboratory studies on MyChart before your provider has had a chance to review them.  We understand that in some cases there may be results that are confusing or concerning to you. Not all laboratory results come back in the same time frame and the provider may be waiting for multiple results in order to interpret others.  Please give [place] 48 hours in order for your provider to thoroughly review all the results before contacting the office for clarification of your results.   Thank you for choosing me and [facility].  Dr. Rush Landmark

## 2019-12-04 ENCOUNTER — Other Ambulatory Visit (INDEPENDENT_AMBULATORY_CARE_PROVIDER_SITE_OTHER): Payer: 59

## 2019-12-04 ENCOUNTER — Other Ambulatory Visit: Payer: Self-pay

## 2019-12-04 DIAGNOSIS — D509 Iron deficiency anemia, unspecified: Secondary | ICD-10-CM

## 2019-12-04 DIAGNOSIS — G35 Multiple sclerosis: Secondary | ICD-10-CM | POA: Diagnosis not present

## 2019-12-04 LAB — FOLATE: Folate: 15 ng/mL (ref >5.9)

## 2019-12-04 LAB — TISSUE TRANSGLUTAMINASE, IGA: (tTG) Ab, IgA: 8 U/mL — ABNORMAL HIGH

## 2019-12-09 ENCOUNTER — Other Ambulatory Visit (HOSPITAL_COMMUNITY): Payer: Self-pay | Admitting: Surgery

## 2019-12-09 ENCOUNTER — Telehealth: Payer: Self-pay

## 2019-12-09 ENCOUNTER — Other Ambulatory Visit: Payer: Self-pay | Admitting: Surgery

## 2019-12-09 DIAGNOSIS — R12 Heartburn: Secondary | ICD-10-CM

## 2019-12-09 DIAGNOSIS — K5900 Constipation, unspecified: Secondary | ICD-10-CM

## 2019-12-09 DIAGNOSIS — D509 Iron deficiency anemia, unspecified: Secondary | ICD-10-CM

## 2019-12-09 DIAGNOSIS — R894 Abnormal immunological findings in specimens from other organs, systems and tissues: Secondary | ICD-10-CM

## 2019-12-09 MED ORDER — FERROUS GLUCONATE 324 (38 FE) MG PO TABS: 324.0000 mg | ORAL_TABLET | Freq: Every day | ORAL | 3 refills | Status: DC

## 2019-12-09 NOTE — Telephone Encounter (Signed)
Rx for Ferrous Gluconate sent to [facility].

## 2019-12-09 NOTE — Telephone Encounter (Signed)
Dr Rush Landmark please allergies listed. I recd an alert when trying to send oral iron to pharmacy. Please advise if ok to send rx for oral iron to pharmacy.

## 2019-12-09 NOTE — Telephone Encounter (Signed)
-----   Message from Irving Copas., MD sent at 12/06/2019  5:18 PM EST ----- [name], I called and spoke with the patient about the results of her labs and have released these on MyChart as well. She has evidence of IDA. She needs to have a repeat CBC/Iron/TIBC/Ferritin after 6-weeks of Iron supplementation. She also has elevated TTG Ab which would make possibility of Celiac disease a reason for her IDA.  Please add Positive Celiac Antibody as a problem to her list. Based on labs, we will then move forward with scheduling her for EGD/Colonoscopy likely as a Category 2, but we will see how she does with the Iron. Ferrous Gluconate 324 mg daily - please send Rx to [facility]. Thanks. GM

## 2019-12-09 NOTE — Telephone Encounter (Signed)
[name], Thank you for reaching out I have previously discussed this with the patient. The only allergy was to IV iron not oral iron. Okay to prescribe oral iron. Thanks. GM

## 2019-12-18 ENCOUNTER — Other Ambulatory Visit: Payer: Self-pay

## 2019-12-18 ENCOUNTER — Ambulatory Visit (HOSPITAL_COMMUNITY)
Admission: RE | Admit: 2019-12-18 | Discharge: 2019-12-18 | Disposition: A | Discharge status: Home / Self Care | Payer: 59 | Source: Ambulatory Visit | Attending: Surgery | Admitting: Surgery

## 2019-12-18 DIAGNOSIS — K449 Diaphragmatic hernia without obstruction or gangrene: Secondary | ICD-10-CM | POA: Diagnosis not present

## 2019-12-18 DIAGNOSIS — Z01818 Encounter for other preprocedural examination: Secondary | ICD-10-CM | POA: Diagnosis not present

## 2020-01-07 ENCOUNTER — Encounter: Payer: Self-pay | Admitting: Skilled Nursing Facility1

## 2020-01-07 ENCOUNTER — Other Ambulatory Visit: Payer: Self-pay

## 2020-01-07 ENCOUNTER — Encounter: Payer: 59 | Attending: Surgery | Admitting: Skilled Nursing Facility1

## 2020-01-07 DIAGNOSIS — E669 Obesity, unspecified: Secondary | ICD-10-CM | POA: Diagnosis not present

## 2020-01-07 NOTE — Progress Notes (Signed)
Nutrition Assessment for Bariatric Surgery Medical Nutrition Therapy  Patient was seen on 01/07/2020 for Pre-Operative Nutrition Assessment. Letter of approval faxed to [facility] bariatric surgery program coordinator on 01/07/20  Referral stated Supervised Weight Loss (SWL) visits needed: 0  Planned surgery: sleeve Pt expectation of surgery: to lose weight Pt expectation of dietitian: to guide/support    NUTRITION ASSESSMENT   Anthropometrics  Start weight at [facility]: 296 lbs (date: 01/07/2020)  Height: 63 in BMI: 52.45 kg/m2     Clinical  Medical hx: MS Medications: see list Labs:  Notable signs/symptoms: chronic back pain, MS flare ups  Lifestyle & Dietary Hx   Pt states she is on Long term disability for her back but really misses working. Pt states since working on her lifestyle changes she has been Able to walk longer and sit up longer. Pt states she does not like to disappoint her nutritionist (at [facility]) and was tearful at the thought of gaining weight and disappointing him.  Pt states she lives with smokers so it has been a challenge to quit.  Pt states her daughters cook all her meals. Pt states she has cut out sugary beverages and does not eat out as much as she used to.  Pt states she is on a 1200 calorie diet and logs what she eats.  Pt states her family does not eat like she does but they make her something different but states this is a financial strain. Pt states she was just diagnosed with Celiac disease. Pt disagrees wth this diagnosed but was open to the education.  Pt states her follow up will be with [facility].   24-Hr Dietary Recall First Meal: salad with [place] meat or protein shake Snack:  fruit Second Meal: salad and meat Snack: protein shake with spinach and banana and almond milk Third Meal: chicken or fish with vegetables  Snack: fruit or vegetables Beverages: water   Estimated Energy Needs Calories: 1200 Carbohydrate: 135  g Protein: 90 g Fat: 33 g   NUTRITION DIAGNOSIS  Overweight/obesity ([place]-3.3) related to past poor dietary habits and physical inactivity as evidenced by patient w/ planned sleeve surgery following dietary guidelines for continued weight loss.    NUTRITION INTERVENTION  Nutrition counseling (C-1) and education (E-2) to facilitate bariatric surgery goals. Dietitian educated pt on celiac disease and the diet appropriate for having celiac disease.    Pre-Op Goals Reviewed with the Patient . Track food and beverage intake (pen and paper, MyFitness Pal, Baritastic app, etc.) . Make healthy food choices while monitoring portion sizes . Consume 3 meals per day or try to eat every 3-5 hours . Avoid concentrated sugars and fried foods . Keep sugar & fat in the single digits per serving on food labels . Practice CHEWING your food (aim for applesauce consistency) . Practice not drinking 15 minutes before, during, and 30 minutes after each meal and snack . Avoid all carbonated beverages (ex: soda, sparkling beverages)  . Limit caffeinated beverages (ex: coffee, tea, energy drinks) . Avoid all sugar-sweetened beverages (ex: regular soda, sports drinks)  . Avoid alcohol  . Aim for 64-100 ounces of FLUID daily (with at least half of fluid intake being plain water)  . Aim for at least 60-80 grams of PROTEIN daily . Look for a liquid protein source that contains ?15 g protein and ?5 g carbohydrate (ex: shakes, drinks, shots) . Make a list of non-food related activities . Physical activity is an important part of a healthy lifestyle  so keep it moving! The goal is to reach 150 minutes of exercise per week, including cardiovascular and weight baring activity.  *Goals that are bolded indicate the pt would like to start working towards these  Handouts Provided Include  . Bariatric Surgery handouts (Nutrition Visits, Pre-Op Goals, Protein Shakes, Vitamins & Minerals) . Other names for wheat . Appropriate  foods for celiac  Learning Style & Readiness for Change Teaching method utilized: Visual & Auditory  Demonstrated degree of understanding via: Teach Back  Barriers to learning/adherence to lifestyle change: none identified      MONITORING & EVALUATION Dietary intake, weekly physical activity, body weight, and pre-op goals reached at next nutrition visit.    Next Steps  Patient is to follow up at [facility] for Pre-Op Class >2 weeks before surgery for further nutrition education.

## 2020-01-09 DIAGNOSIS — I10 Essential (primary) hypertension: Secondary | ICD-10-CM | POA: Diagnosis not present

## 2020-01-09 DIAGNOSIS — Z6841 Body Mass Index (BMI) 40.0 and over, adult: Secondary | ICD-10-CM | POA: Diagnosis not present

## 2020-01-09 DIAGNOSIS — Z713 Dietary counseling and surveillance: Secondary | ICD-10-CM | POA: Diagnosis not present

## 2020-01-15 ENCOUNTER — Telehealth: Payer: Self-pay | Admitting: Skilled Nursing Facility1

## 2020-01-15 NOTE — Telephone Encounter (Signed)
Pt asked for post-op liquids since she had some money to spend currently.  Dietitian counseled pt on brands and options of liquids and proteins.

## 2020-01-22 DIAGNOSIS — M5442 Lumbago with sciatica, left side: Secondary | ICD-10-CM | POA: Diagnosis not present

## 2020-01-22 DIAGNOSIS — Z713 Dietary counseling and surveillance: Secondary | ICD-10-CM | POA: Diagnosis not present

## 2020-01-22 DIAGNOSIS — G8929 Other chronic pain: Secondary | ICD-10-CM | POA: Diagnosis not present

## 2020-01-22 DIAGNOSIS — Z6841 Body Mass Index (BMI) 40.0 and over, adult: Secondary | ICD-10-CM | POA: Diagnosis not present

## 2020-02-06 DIAGNOSIS — G4733 Obstructive sleep apnea (adult) (pediatric): Secondary | ICD-10-CM | POA: Diagnosis not present

## 2020-02-21 ENCOUNTER — Telehealth: Payer: Self-pay | Admitting: Physician Assistant

## 2020-02-21 NOTE — Telephone Encounter (Signed)
RN  From [place] [phone] she said she sent various Fax for A C pop supplies,Pt Num [phone]

## 2020-02-24 NOTE — Telephone Encounter (Signed)
Spoke to patient about her CPAP supplies if she received them, per patient she got them about 2 weeks ago. Also, I called the CPAP supply place in [facility] spoke to [place] I advised her the patient's provider [facility] Jacqulynn Cadet PA-C) is no longer at this practice. I gave her the name/address/fax number to the office so in the near future any request or forms can be faxed to [place].

## 2020-02-25 ENCOUNTER — Ambulatory Visit (INDEPENDENT_AMBULATORY_CARE_PROVIDER_SITE_OTHER): Payer: Self-pay | Admitting: Psychology

## 2020-02-25 DIAGNOSIS — F509 Eating disorder, unspecified: Secondary | ICD-10-CM

## 2020-02-28 ENCOUNTER — Telehealth: Payer: Self-pay

## 2020-02-28 NOTE — Telephone Encounter (Signed)
Dr.Mansouraty I reached out to pt today because she is due to have labs repeated , had to leave a message. She also needs to have endoscopies at the hospital. She was not scheduled previously due to hospital covid restrictions. Once I have spoken with pt will she be considered a Category 1 or 2 so that I may get her scheduled at hospital? Please advise.

## 2020-03-02 ENCOUNTER — Ambulatory Visit: Payer: 59 | Admitting: Psychology

## 2020-03-02 NOTE — Telephone Encounter (Signed)
[name], At this point, there is no category 1 or category 2.  All patients can be scheduled as necessary. Thanks. GM

## 2020-03-03 ENCOUNTER — Telehealth: Payer: Self-pay | Admitting: *Deleted

## 2020-03-03 MED ORDER — FERROUS GLUCONATE 324 (38 FE) MG PO TABS: 324.0000 mg | ORAL_TABLET | Freq: Every day | ORAL | 3 refills | Status: DC

## 2020-03-03 NOTE — Telephone Encounter (Addendum)
Spoke with pt today. She informed me that she no longer has insurance. She is in the process of trying to get new insurance. Pt states that she will let me know once she has insurance and schedule endoscopies at that time. Also pt informed me that she never started her oral Iron as directed. I  re-sent RX to [facility]. Pt advised that she will need to have labs done in 4 weeks after she has started oral Iron. Pt voiced understanding.

## 2020-03-03 NOTE — Telephone Encounter (Signed)
Received fax from [place] [employer]: patient is approved to receive Ocrevus free of charge.  Approval date 03/03/2020. Letter given to [employer], RN, infusion suite.

## 2020-03-23 IMAGING — RF DG UGI W SINGLE CM
10 series · 10 of 10 positions shown · non-contrast
Comparison: None.

CLINICAL DATA: Preop bariatric surgery

EXAM:
UPPER GI SERIES WITH KUB
TECHNIQUE: After obtaining a scout radiograph a routine upper GI series was
performed using thin barium
FLUOROSCOPY TIME:  Fluoroscopy Time:  1 minutes 18 seconds
Radiation Exposure Index (if provided by the fluoroscopic device):
66.8 mGy
Number of Acquired Spot Images: 0

[Series 1: t abdomen supine · 0.15mm/px · 1 of 1 slices shown (1 of 2)]
[im 1/1]
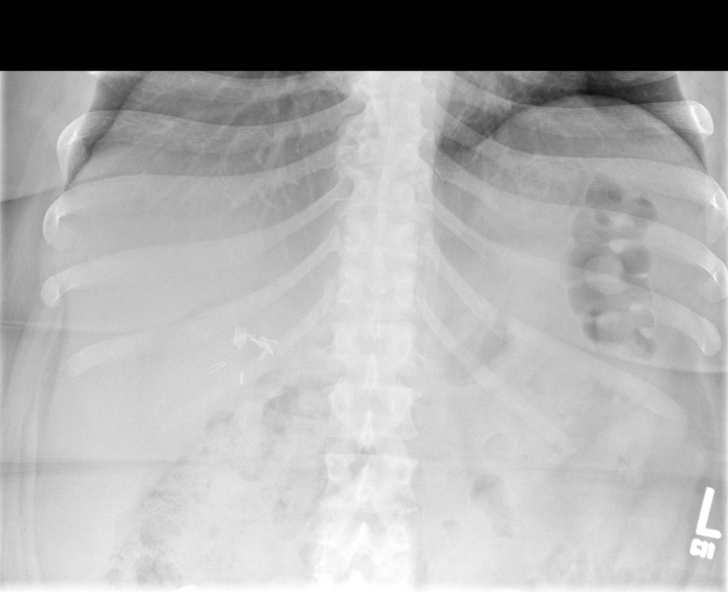

[Series 2: t abdomen supine · 0.15mm/px · 1 of 1 slices shown (2 of 2)]
[im 1/1]
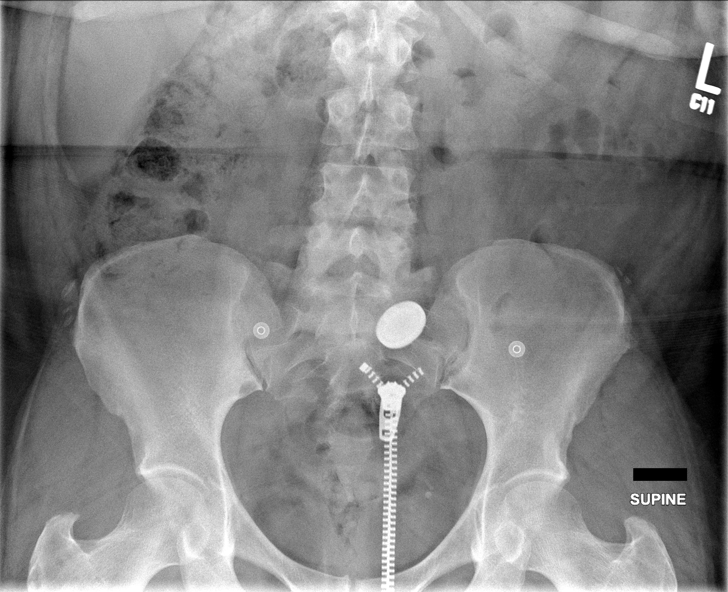

[Series 3: cp_standard · 0.26mm/px · 1 of 1 slices shown (1 of 8)]
[im 1/1]
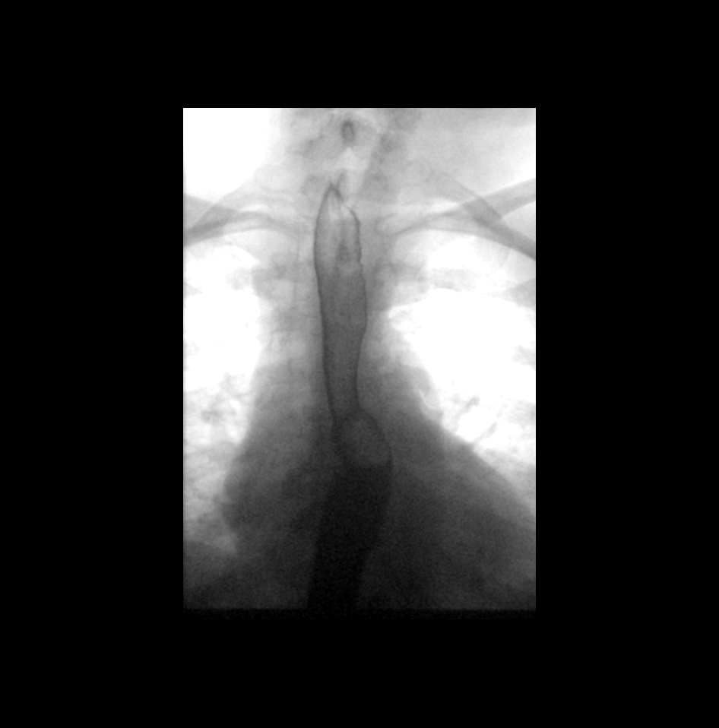

[Series 4: cp_standard · 0.26mm/px · 1 of 1 slices shown (2 of 8)]
[im 1/1]
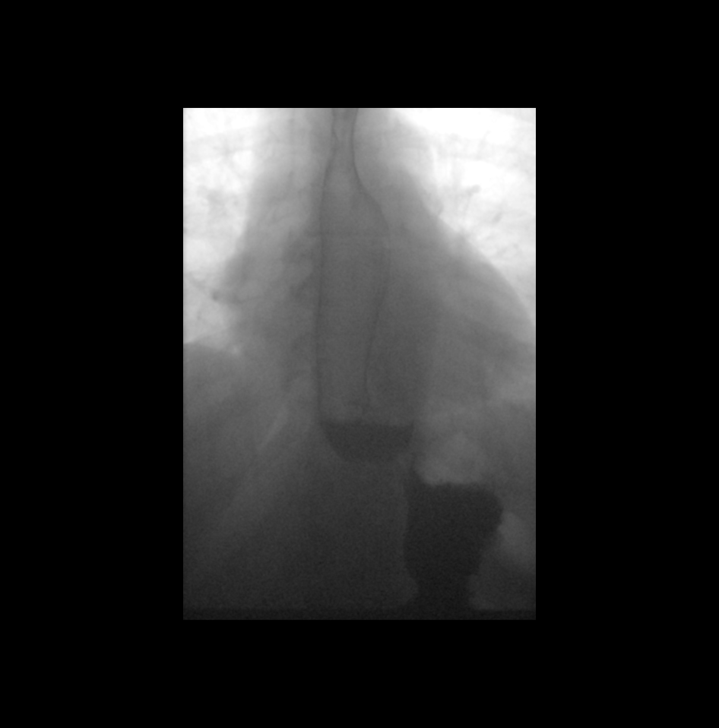

[Series 5: cp_standard · 0.18mm/px · 1 of 1 slices shown (3 of 8)]
[im 1/1]
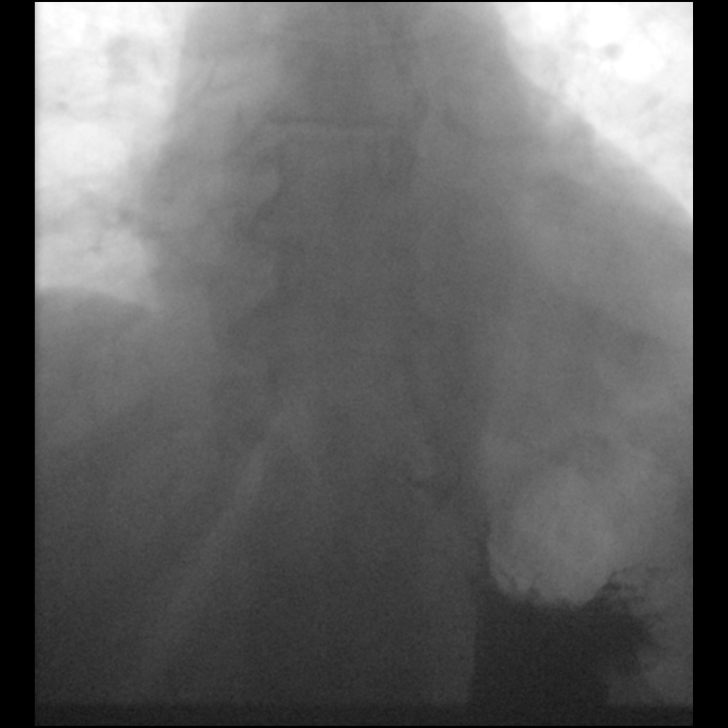

[Series 6: cp_standard · 0.18mm/px · 1 of 1 slices shown (4 of 8)]
[im 1/1]
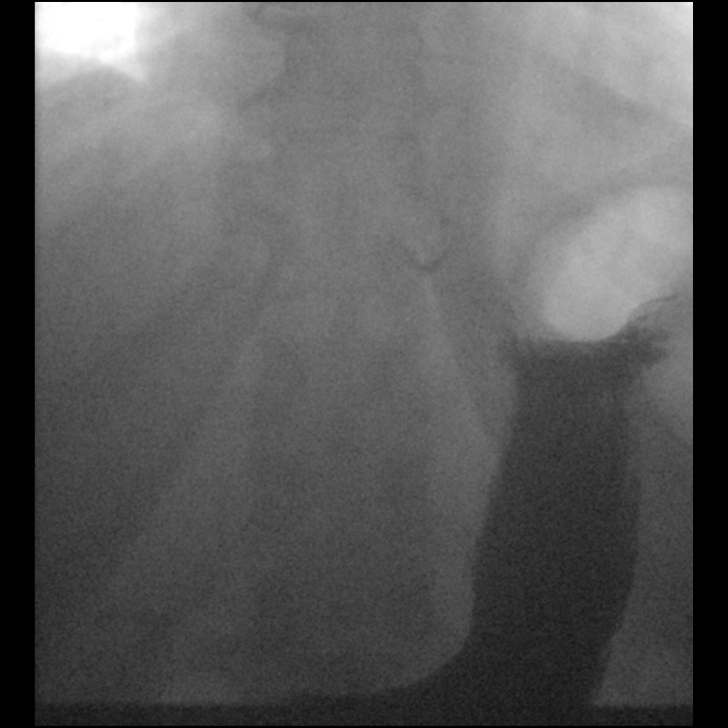

[Series 7: cp_standard · 0.18mm/px · 1 of 1 slices shown (5 of 8)]
[im 1/1]
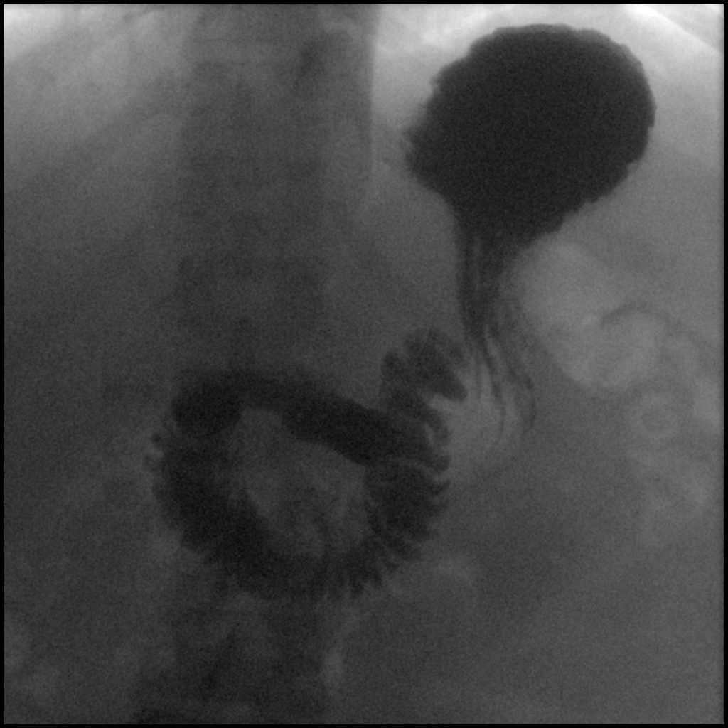

[Series 8: cp_standard · 0.18mm/px · 1 of 1 slices shown (6 of 8)]
[im 1/1]
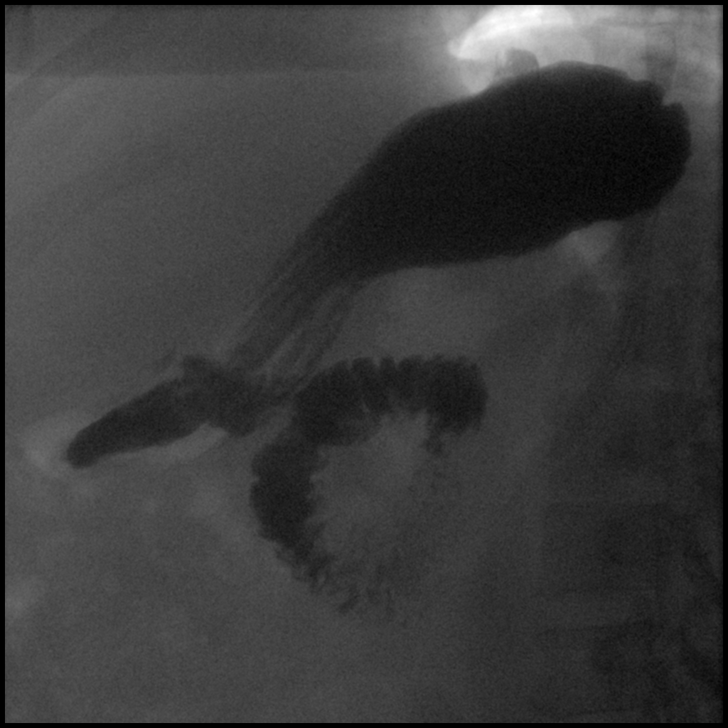

[Series 9: cp_standard · 0.18mm/px · 1 of 1 slices shown (7 of 8)]
[im 1/1]
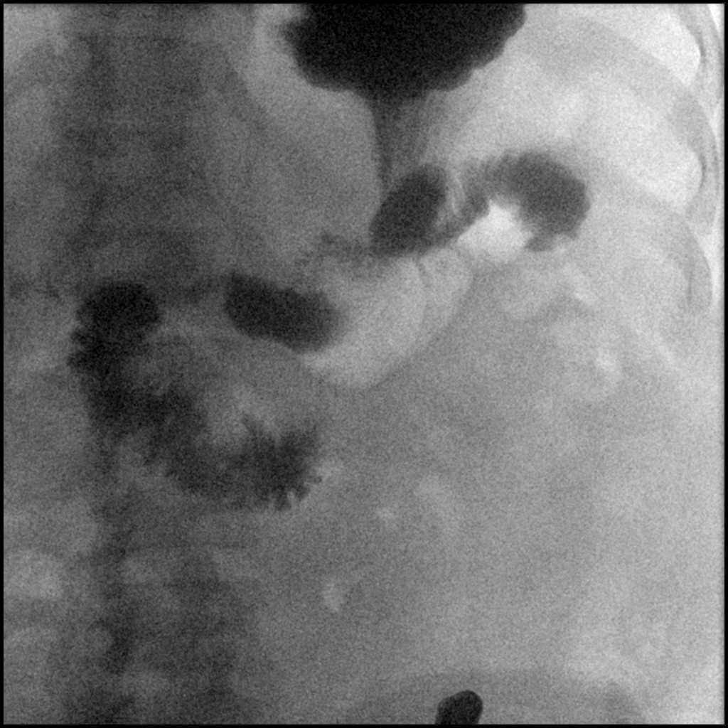

[Series 10: cp_standard · 0.18mm/px · 1 of 1 slices shown (8 of 8)]
[im 1/1]
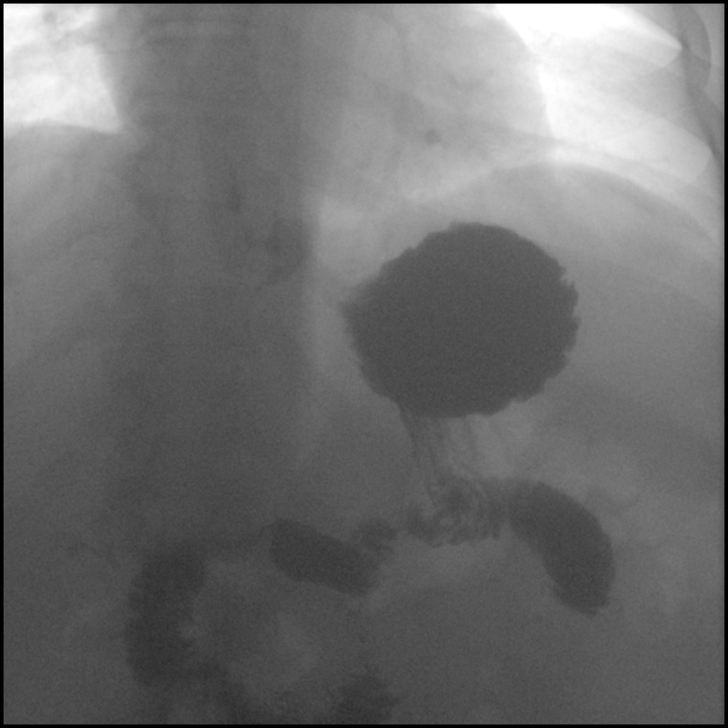

[10 of 10 positions shown; findings below may reference images not displayed]

FINDINGS: On the scout radiograph there is a normal bowel gas pattern.
Cholecystectomy clips noted in the right upper quadrant of the
abdomen.

The esophagus is patent. No stricture or mass identified. There is a
small hiatal hernia. No reflux noted. The stomach has a otherwise
normal configuration. Unremarkable appearance of the duodenal bulb
and C loop.
IMPRESSION: 1. Small hiatal hernia noted.  No reflux identified.

## 2020-04-07 DIAGNOSIS — Z0271 Encounter for disability determination: Secondary | ICD-10-CM

## 2020-04-28 ENCOUNTER — Ambulatory Visit: Payer: Self-pay | Admitting: Diagnostic Neuroimaging

## 2020-04-28 ENCOUNTER — Encounter: Payer: Self-pay | Admitting: Diagnostic Neuroimaging

## 2020-04-28 ENCOUNTER — Other Ambulatory Visit: Payer: Self-pay

## 2020-04-28 VITALS — BP 148/96 | HR 98 | Ht 63.0 in | Wt 304.0 lb

## 2020-04-28 DIAGNOSIS — G35 Multiple sclerosis: Secondary | ICD-10-CM

## 2020-04-28 NOTE — Patient Instructions (Signed)
  MULTIPLE SCLEROSIS (relapsing, remitting multiple sclerosis; established, stable) - continue ocrevus - continue exercises and PT - continue vitamin D - monitor CBC, CMP

## 2020-04-28 NOTE — Progress Notes (Signed)
Chief Complaint  Patient presents with  . Multiple Sclerosis    rm 7, 6 month FU, "next Ocrevus infusion 06/17/20, having lower back issues w/bulging disk, weakness in legs, can't exercise like I was "    History of Present Illness:  UPDATE (04/28/20, VRP): Since last visit, had to leave work due to long term disability expiring. Applying for medicaid. MS symptoms stable. HA stable. However, back and left leg pain are worsened. Not able to work because of low back pain and left leg pain, cannot sit for prolonged time and severe pain. Not driving right now. More depression.  PRIOR HPI:  - MS sxs are stable; tolerating ocrevus - left sciatica pain is stable; continues to struggle with pain mgmt; has tried [facility] without relief; now seeing chiropractor and doing better - mood, nutrition, exercise are improving - has been out of work since Feb 2020    Observations/Objective:  GENERAL EXAM/CONSTITUTIONAL: Vitals:  Vitals:   04/28/20 0833  BP: (!) 148/96  Pulse: 98  Weight: (!) 304 lb (137.9 kg)  Height: 5\' 3"  (1.6 m)   Body mass index is 53.85 kg/m. Wt Readings from Last 3 Encounters:  04/28/20 (!) 304 lb (137.9 kg)  01/07/20 296 lb 1.6 oz (134.3 kg)  12/03/19 294 lb 12.8 oz (133.7 kg)    Patient is in no distress; well developed, nourished and groomed; neck is supple  CARDIOVASCULAR:  Examination of carotid arteries is normal; no carotid bruits  Regular rate and rhythm, no murmurs  Examination of peripheral vascular system by observation and palpation is normal  EYES:  Ophthalmoscopic exam of optic discs and posterior segments is normal; no papilledema or hemorrhages No exam data present  MUSCULOSKELETAL:  Gait, strength, tone, movements noted in Neurologic exam below  NEUROLOGIC: MENTAL STATUS:  No flowsheet data found.  awake, alert, oriented to person, place and time  recent and remote memory intact  normal attention and concentration  language fluent,  comprehension intact, naming intact  fund of knowledge appropriate  CRANIAL NERVE:   2nd - no papilledema on fundoscopic exam  2nd, 3rd, 4th, 6th - pupils equal and reactive to light, visual fields full to confrontation, extraocular muscles intact, no nystagmus  5th - facial sensation symmetric  7th - facial strength symmetric  8th - hearing intact  9th - palate elevates symmetrically, uvula midline  11th - shoulder shrug symmetric  12th - tongue protrusion midline  MOTOR:   normal bulk and tone, full strength in the BUE  BLE limited by pain (right HF 3, KE 4, DF 4; left HF 2-3, KE 2-3, DF 3)  SENSORY:   normal and symmetric to light touch, temperature, vibration  COORDINATION:   finger-nose-finger, fine finger movements normal  REFLEXES:   deep tendon reflexes present and symmetric  GAIT/STATION:   narrow based gait; USING CANE; LEFT LEG PAIN WITH WALKING; restless, cannot sit for long time  Lab Results  Component Value Date   WBC 10.5 12/03/2019   HGB 11.3 (L) 12/03/2019   HCT 35.5 (L) 12/03/2019   MCV 69.3 Repeated and verified X2. (L) 12/03/2019   PLT 293.0 12/03/2019    CMP Latest Ref Rng & Units 01/01/2019 03/17/2018 12/27/2017  Glucose 65 - 99 mg/dL 91 80 84  BUN 6 - 24 mg/dL 14 8 6   Creatinine 0.57 - 1.00 mg/dL 0.87 0.80 0.83  Sodium 134 - 144 mmol/L 141 142 140  Potassium 3.5 - 5.2 mmol/L 4.8 4.5 4.4  Chloride  96 - 106 mmol/L 103 107(H) 104  CO2 20 - 29 mmol/L 23 21 21   Calcium 8.7 - 10.2 mg/dL 10.7(H) 10.8(H) 11.2(H)  Total Protein 6.0 - 8.5 g/dL 6.5 6.9 7.2  Total Bilirubin 0.0 - 1.2 mg/dL <0.2 0.2 0.2  Alkaline Phos 39 - 117 IU/L 96 92 91  AST 0 - 40 IU/L 8 14 15   ALT 0 - 32 IU/L 14 15 16      01/07/19 MRI brain  - No change since September of 2018. Multiple cerebral hemispheric white matter lesions consistent with chronic multiple sclerosis. No new or progressive lesions. No lesions show  restricted diffusion or contrast  enhancement.  01/07/19 MRI lumbar spine - L4-5: Bilateral facet arthropathy with gaping, fluid-filled joints. Mild biforaminal disc bulges. No visible neural compression in this position. With standing or flexion, the patient could develop anterolisthesis, which could be significant. Facet arthropathy could certainly be a cause of back pain or referred facet syndrome pain as well. - L5-S1: Small left paracentral disc protrusion which approaches the left S1 nerve but does not visibly compress or displace it. It is possible this could be associated with left S1 nerve irritation. Mild facet osteoarthritis at this leel could contribute to back pain.     Assessment and Plan:  49 y.o. female with:   Dx:  1. MS (multiple sclerosis) ([place])     MULTIPLE SCLEROSIS (relapsing, remitting multiple sclerosis; established, stable) - continue ocrevus - continue exercises and PT - continue vitamin D - monitor CBC, CMP - check MRI brain (after medicaid approved)  LEFT LEG PAIN / BACK PAIN (persistent; sciatica; ? piriformis syndrome) - follow up with Dr. Jimmye Norman (chiro); limitation of sitting, standing and mobility due to pain  OBESITY (established, slight set back) - follow up with nutritionist once medicaid is approved  MIGRAINE HEADACHES (established, stable) - monitor for now  MENINGIOMA (small, asymptomatic) - incidental finding; reassured patient   Follow Up Instructions:  - Return in about 6 months (around 10/28/2020).     Penni Bombard, MD 0/17/7939, 0:30 AM Certified in Neurology, Neurophysiology and Neuroimaging  [facility] [address] [place], [place] [address] [phone]

## 2020-07-16 ENCOUNTER — Other Ambulatory Visit (HOSPITAL_COMMUNITY): Payer: Self-pay | Admitting: Physician Assistant

## 2020-07-21 ENCOUNTER — Telehealth: Payer: Self-pay | Admitting: *Deleted

## 2020-07-21 NOTE — Telephone Encounter (Signed)
Received request for new Ocrevus start form from [employer]. Form completed, on MD's desk for review, signature.

## 2020-07-21 NOTE — Telephone Encounter (Signed)
Ocrevus start form signed, faxed to [employer], copy given to [employer] , infusion suite.

## 2020-09-10 ENCOUNTER — Other Ambulatory Visit (HOSPITAL_COMMUNITY): Payer: Self-pay | Admitting: Physician Assistant

## 2020-09-23 ENCOUNTER — Telehealth: Payer: Self-pay | Admitting: *Deleted

## 2020-09-23 NOTE — Telephone Encounter (Signed)
Lt message not able to reach pt.

## 2020-11-03 ENCOUNTER — Ambulatory Visit: Payer: Self-pay | Admitting: Diagnostic Neuroimaging

## 2020-11-10 ENCOUNTER — Ambulatory Visit: Payer: Self-pay | Admitting: Diagnostic Neuroimaging

## 2020-11-10 ENCOUNTER — Encounter: Payer: Self-pay | Admitting: Diagnostic Neuroimaging

## 2020-11-10 VITALS — BP 133/83 | HR 93 | Ht 63.0 in | Wt 316.4 lb

## 2020-11-10 DIAGNOSIS — R937 Abnormal findings on diagnostic imaging of other parts of musculoskeletal system: Secondary | ICD-10-CM

## 2020-11-10 DIAGNOSIS — Z79899 Other long term (current) drug therapy: Secondary | ICD-10-CM

## 2020-11-10 DIAGNOSIS — G35 Multiple sclerosis: Secondary | ICD-10-CM

## 2020-11-10 DIAGNOSIS — G43009 Migraine without aura, not intractable, without status migrainosus: Secondary | ICD-10-CM

## 2020-11-10 NOTE — Progress Notes (Signed)
Chief Complaint  Patient presents with  . Multiple Sclerosis    Rm 7, 6 month FU, "next Ocrevus infusion in Feb, going well; no new symptoms; did not qualify for Medicaid"    History of Present Illness:  UPDATE (11/10/20, VRP): Since last visit, doing about the same. Symptoms are stable. Severity is moderate. No alleviating or aggravating factors. Tolerating meds. Had some positional dizziness, but improved with hydration.  UPDATE (04/28/20, VRP): Since last visit, had to leave work due to long term disability expiring. Applying for medicaid. MS symptoms stable. HA stable. However, back and left leg pain are worsened. Not able to work because of low back pain and left leg pain, cannot sit for prolonged time and severe pain. Not driving right now. More depression.  PRIOR HPI:  - MS sxs are stable; tolerating ocrevus - left sciatica pain is stable; continues to struggle with pain mgmt; has tried [facility] without relief; now seeing chiropractor and doing better - mood, nutrition, exercise are improving - has been out of work since Feb 2020    Observations/Objective:  GENERAL EXAM/CONSTITUTIONAL: Vitals:  Vitals:   11/10/20 0827  BP: 133/83  Pulse: 93  Weight: (!) 316 lb 6.4 oz (143.5 kg)  Height: 5\' 3"  (1.6 m)   Body mass index is 56.05 kg/m. Wt Readings from Last 3 Encounters:  11/10/20 (!) 316 lb 6.4 oz (143.5 kg)  04/28/20 (!) 304 lb (137.9 kg)  01/07/20 296 lb 1.6 oz (134.3 kg)    Patient is in no distress; well developed, nourished and groomed; neck is supple  CARDIOVASCULAR:  Examination of carotid arteries is normal; no carotid bruits  Regular rate and rhythm, no murmurs  Examination of peripheral vascular system by observation and palpation is normal  EYES:  Ophthalmoscopic exam of optic discs and posterior segments is normal; no papilledema or hemorrhages No exam data present  MUSCULOSKELETAL:  Gait, strength, tone, movements noted in Neurologic exam  below  NEUROLOGIC: MENTAL STATUS:  No flowsheet data found.  awake, alert, oriented to person, place and time  recent and remote memory intact  normal attention and concentration  language fluent, comprehension intact, naming intact  fund of knowledge appropriate  CRANIAL NERVE:   2nd - no papilledema on fundoscopic exam  2nd, 3rd, 4th, 6th - pupils equal and reactive to light, visual fields full to confrontation, extraocular muscles intact, no nystagmus  5th - facial sensation symmetric  7th - facial strength symmetric  8th - hearing intact  9th - palate elevates symmetrically, uvula midline  11th - shoulder shrug symmetric  12th - tongue protrusion midline  MOTOR:   normal bulk and tone, full strength in the BUE  BLE 4 prox, 5 distal  SENSORY:   normal and symmetric to light touch, temperature, vibration  COORDINATION:   finger-nose-finger, fine finger movements normal  REFLEXES:   deep tendon reflexes present and symmetric  GAIT/STATION:   narrow based gait; USING CANE; cannot sit for long time  Lab Results  Component Value Date   WBC 10.5 12/03/2019   HGB 11.3 (L) 12/03/2019   HCT 35.5 (L) 12/03/2019   MCV 69.3 Repeated and verified X2. (L) 12/03/2019   PLT 293.0 12/03/2019    CMP Latest Ref Rng & Units 01/01/2019 03/17/2018 12/27/2017  Glucose 65 - 99 mg/dL 91 80 84  BUN 6 - 24 mg/dL 14 8 6   Creatinine 0.57 - 1.00 mg/dL 0.87 0.80 0.83  Sodium 134 - 144 mmol/L 141 142 140  Potassium 3.5 - 5.2 mmol/L 4.8 4.5 4.4  Chloride 96 - 106 mmol/L 103 107(H) 104  CO2 20 - 29 mmol/L 23 21 21   Calcium 8.7 - 10.2 mg/dL 10.7(H) 10.8(H) 11.2(H)  Total Protein 6.0 - 8.5 g/dL 6.5 6.9 7.2  Total Bilirubin 0.0 - 1.2 mg/dL 0.2 0.2  Alkaline Phos 39 - 117 IU/L 96 92 91  AST 0 - 40 IU/L 8 14 15   ALT 0 - 32 IU/L 14 15 16      01/07/19 MRI brain  - No change since September of 2018. Multiple cerebral hemispheric white matter lesions consistent with chronic  multiple sclerosis. No new or progressive lesions. No lesions show  restricted diffusion or contrast enhancement.  01/07/19 MRI lumbar spine - L4-5: Bilateral facet arthropathy with gaping, fluid-filled joints. Mild biforaminal disc bulges. No visible neural compression in this position. With standing or flexion, the patient could develop anterolisthesis, which could be significant. Facet arthropathy could certainly be a cause of back pain or referred facet syndrome pain as well. - L5-S1: Small left paracentral disc protrusion which approaches the left S1 nerve but does not visibly compress or displace it. It is possible this could be associated with left S1 nerve irritation. Mild facet osteoarthritis at this leel could contribute to back pain.     Assessment and Plan:  49 y.o. female with:   Dx:  1. MS (multiple sclerosis) ([facility])   2. Migraine without aura and without status migrainosus, not intractable   3. Abnormal MRI, lumbar spine   4. High risk medication use      MULTIPLE SCLEROSIS (relapsing, remitting multiple sclerosis; established, stable) - continue ocrevus - continue exercises and PT - continue vitamin D - monitor CBC, CMP - check MRI brain (after medicaid approved)  LEFT LEG PAIN / BACK PAIN (persistent; sciatica; ? piriformis syndrome) - follow up with Dr. 11-30-1989 (chiro); limitation of sitting, standing and mobility due to pain  OBESITY - follow up with nutritionist once medicaid is approved  MIGRAINE HEADACHES (established, stable) - monitor for now  MENINGIOMA (small, asymptomatic) - incidental finding; reassured patient   Follow Up Instructions:  - Return in about 6 months (around 05/11/2021).     42, MD 11/10/2020, 9:00 AM Certified in Neurology, Neurophysiology and Neuroimaging  [facility] [address], [address] [place], [place] [facility] [address] [phone]

## 2020-11-19 ENCOUNTER — Other Ambulatory Visit (HOSPITAL_COMMUNITY): Payer: Self-pay | Admitting: Physician Assistant

## 2021-02-04 ENCOUNTER — Other Ambulatory Visit (HOSPITAL_BASED_OUTPATIENT_CLINIC_OR_DEPARTMENT_OTHER): Payer: Self-pay

## 2021-03-09 ENCOUNTER — Other Ambulatory Visit (HOSPITAL_COMMUNITY): Payer: Self-pay

## 2021-03-09 MED FILL — Telmisartan-Hydrochlorothiazide Tab 80-25 MG: ORAL | 30 days supply | Qty: 30 | Fill #0 | Status: AC

## 2021-03-16 ENCOUNTER — Other Ambulatory Visit (HOSPITAL_COMMUNITY): Payer: Self-pay

## 2021-03-16 MED ORDER — HYDROCODONE-ACETAMINOPHEN 5-325 MG PO TABS
ORAL_TABLET | ORAL | 0 refills | Status: DC
  Filled 2021-03-16: qty 30, 7d supply, fill #0

## 2021-03-16 MED ORDER — ZOLPIDEM TARTRATE 10 MG PO TABS
ORAL_TABLET | ORAL | 0 refills | Status: DC
  Filled 2021-03-16: qty 30, 30d supply, fill #0

## 2021-04-12 ENCOUNTER — Other Ambulatory Visit: Payer: Self-pay

## 2021-04-13 ENCOUNTER — Other Ambulatory Visit (HOSPITAL_COMMUNITY): Payer: Self-pay

## 2021-04-13 MED ORDER — TELMISARTAN-HCTZ 80-25 MG PO TABS
1.0000 | ORAL_TABLET | Freq: Every day | ORAL | 3 refills | Status: DC
  Filled 2021-04-13: qty 30, 30d supply, fill #0
  Filled 2021-05-11: qty 30, 30d supply, fill #1
  Filled 2021-06-09: qty 30, 30d supply, fill #3
  Filled 2021-06-09: qty 30, 30d supply, fill #2

## 2021-04-14 ENCOUNTER — Other Ambulatory Visit (HOSPITAL_COMMUNITY): Payer: Self-pay

## 2021-04-26 ENCOUNTER — Other Ambulatory Visit (HOSPITAL_BASED_OUTPATIENT_CLINIC_OR_DEPARTMENT_OTHER): Payer: Self-pay

## 2021-04-29 ENCOUNTER — Other Ambulatory Visit (HOSPITAL_COMMUNITY): Payer: Self-pay

## 2021-04-29 MED FILL — Amlodipine Besylate Tab 5 MG (Base Equivalent): ORAL | 90 days supply | Qty: 90 | Fill #0 | Status: AC

## 2021-05-11 ENCOUNTER — Other Ambulatory Visit (HOSPITAL_COMMUNITY): Payer: Self-pay

## 2021-05-11 ENCOUNTER — Ambulatory Visit: Payer: Self-pay | Admitting: Diagnostic Neuroimaging

## 2021-05-18 ENCOUNTER — Ambulatory Visit: Payer: Self-pay | Admitting: Diagnostic Neuroimaging

## 2021-06-01 ENCOUNTER — Other Ambulatory Visit: Payer: Self-pay | Admitting: Physician Assistant

## 2021-06-01 DIAGNOSIS — Z1231 Encounter for screening mammogram for malignant neoplasm of breast: Secondary | ICD-10-CM

## 2021-06-08 ENCOUNTER — Other Ambulatory Visit (HOSPITAL_COMMUNITY): Payer: Self-pay

## 2021-06-08 MED ORDER — VALACYCLOVIR HCL 1 G PO TABS
ORAL_TABLET | ORAL | 0 refills | Status: DC
  Filled 2021-06-08: qty 14, 7d supply, fill #0

## 2021-06-09 ENCOUNTER — Other Ambulatory Visit (HOSPITAL_COMMUNITY): Payer: Self-pay

## 2021-06-10 ENCOUNTER — Other Ambulatory Visit (HOSPITAL_COMMUNITY): Payer: Self-pay

## 2021-06-14 ENCOUNTER — Telehealth: Payer: Self-pay

## 2021-06-14 ENCOUNTER — Ambulatory Visit: Payer: Medicare Other | Admitting: Diagnostic Neuroimaging

## 2021-06-14 ENCOUNTER — Encounter: Payer: Self-pay | Admitting: Diagnostic Neuroimaging

## 2021-06-14 VITALS — BP 124/72 | HR 85 | Ht 63.0 in | Wt 314.0 lb

## 2021-06-14 DIAGNOSIS — M4807 Spinal stenosis, lumbosacral region: Secondary | ICD-10-CM

## 2021-06-14 DIAGNOSIS — R5383 Other fatigue: Secondary | ICD-10-CM | POA: Diagnosis not present

## 2021-06-14 DIAGNOSIS — G43009 Migraine without aura, not intractable, without status migrainosus: Secondary | ICD-10-CM

## 2021-06-14 DIAGNOSIS — G35 Multiple sclerosis: Secondary | ICD-10-CM

## 2021-06-14 NOTE — Progress Notes (Signed)
Chief Complaint  Patient presents with   Multiple Sclerosis    Rm 6, 6 month FU, on Ocrevus, next infusion due Sept "my left leg feels tired/heavy, losing muscle mass"      History of Present Illness:  UPDATE (06/14/21, VRP): Since last visit, doing well. Symptoms are stable overall, except right leg now hurting more. Left leg pain stable. Tolerating ocrevus.     UPDATE (11/10/20, VRP): Since last visit, doing about the same. Symptoms are stable. Severity is moderate. No alleviating or aggravating factors. Tolerating meds. Had some positional dizziness, but improved with hydration.  UPDATE (04/28/20, VRP): Since last visit, had to leave work due to long term disability expiring. Applying for medicaid. MS symptoms stable. HA stable. However, back and left leg pain are worsened. Not able to work because of low back pain and left leg pain, cannot sit for prolonged time and severe pain. Not driving right now. More depression.  PRIOR HPI:  - MS sxs are stable; tolerating ocrevus - left sciatica pain is stable; continues to struggle with pain mgmt; has tried [facility] without relief; now seeing chiropractor and doing better - mood, nutrition, exercise are improving - has been out of work since Feb 2020    Observations/Objective:  GENERAL EXAM/CONSTITUTIONAL: Vitals:  Vitals:   06/14/21 1316  BP: 124/72  Pulse: 85  Weight: (!) 314 lb (142.4 kg)  Height: '5\' 3"'$  (1.6 m)   Body mass index is 55.62 kg/m. Wt Readings from Last 3 Encounters:  06/14/21 (!) 314 lb (142.4 kg)  11/10/20 (!) 316 lb 6.4 oz (143.5 kg)  04/28/20 (!) 304 lb (137.9 kg)   Patient is in no distress; well developed, nourished and groomed; neck is supple  CARDIOVASCULAR: Examination of carotid arteries is normal; no carotid bruits Regular rate and rhythm, no murmurs Examination of peripheral vascular system by observation and palpation is normal  EYES: Ophthalmoscopic exam of optic discs and posterior segments is  normal; no papilledema or hemorrhages No results found.  MUSCULOSKELETAL: Gait, strength, tone, movements noted in Neurologic exam below  NEUROLOGIC: MENTAL STATUS:  No flowsheet data found. awake, alert, oriented to person, place and time recent and remote memory intact normal attention and concentration language fluent, comprehension intact, naming intact fund of knowledge appropriate  CRANIAL NERVE:  2nd - no papilledema on fundoscopic exam 2nd, 3rd, 4th, 6th - pupils equal and reactive to light, visual fields full to confrontation, extraocular muscles intact, no nystagmus 5th - facial sensation symmetric 7th - facial strength symmetric 8th - hearing intact 9th - palate elevates symmetrically, uvula midline 11th - shoulder shrug symmetric 12th - tongue protrusion midline  MOTOR:  normal bulk and tone, full strength in the BUE BLE 4 prox, 5 distal  SENSORY:  normal and symmetric to light touch, temperature, vibration  COORDINATION:  finger-nose-finger, fine finger movements normal  REFLEXES:  deep tendon reflexes present and symmetric  GAIT/STATION:  narrow based gait; USING CANE  Lab Results  Component Value Date   WBC 10.5 12/03/2019   HGB 11.3 (L) 12/03/2019   HCT 35.5 (L) 12/03/2019   MCV 69.3 Repeated and verified X2. (L) 12/03/2019   PLT 293.0 12/03/2019    CMP Latest Ref Rng & Units 01/01/2019 03/17/2018 12/27/2017  Glucose 65 - 99 mg/dL 91 80 84  BUN 6 - 24 mg/dL '14 8 6  '$ Creatinine 0.57 - 1.00 mg/dL 0.87 0.80 0.83  Sodium 134 - 144 mmol/L 141 142 140  Potassium 3.5 - 5.2  mmol/L 4.8 4.5 4.4  Chloride 96 - 106 mmol/L 103 107(H) 104  CO2 20 - 29 mmol/L '23 21 21  '$ Calcium 8.7 - 10.2 mg/dL 10.7(H) 10.8(H) 11.2(H)  Total Protein 6.0 - 8.5 g/dL 6.5 6.9 7.2  Total Bilirubin 0.0 - 1.2 mg/dL <0.2 0.2 0.2  Alkaline Phos 39 - 117 IU/L 96 92 91  AST 0 - 40 IU/L '8 14 15  '$ ALT 0 - 32 IU/L '14 15 16     '$ 01/07/19 MRI brain  - No change since September of 2018.  Multiple cerebral hemispheric white matter lesions consistent with chronic multiple sclerosis. No new or progressive lesions. No lesions show  restricted diffusion or contrast enhancement.  01/07/19 MRI lumbar spine - L4-5: Bilateral facet arthropathy with gaping, fluid-filled joints. Mild biforaminal disc bulges. No visible neural compression in this position. With standing or flexion, the patient could develop anterolisthesis, which could be significant. Facet arthropathy could certainly be a cause of back pain or referred facet syndrome pain as well.  - L5-S1: Small left paracentral disc protrusion which approaches the left S1 nerve but does not visibly compress or displace it. It is possible this could be associated with left S1 nerve irritation. Mild facet osteoarthritis at this leel could contribute to back pain.      Assessment and Plan:  50 y.o. female with:   Dx:  1. MS (multiple sclerosis) ([place])   2. Migraine without aura and without status migrainosus, not intractable   3. Obesity, Class III, BMI 40-49.9 (morbid obesity) ([facility])   4. Other fatigue   5. Spinal stenosis of lumbosacral region       MULTIPLE SCLEROSIS (relapsing, remitting multiple sclerosis; established, stable) - continue ocrevus - continue exercises and PT - continue vitamin D - monitor CBC, CMP - check MRI brain   LEFT LEG PAIN / BACK PAIN (persistent; sciatica; ? piriformis syndrome) - check MRI lumbar spine - follow up with Dr. Jimmye Norman (chiro); limitation of sitting, standing and mobility due to pain   OBESITY - refer to medical weight mgmt clinic   MIGRAINE HEADACHES (established, stable) - monitor for now   MENINGIOMA (small, asymptomatic) - incidental finding; reassured patient  Orders Placed This Encounter  Procedures   MR BRAIN W WO CONTRAST   MR LUMBAR SPINE WO CONTRAST   CBC with Differential/Platelet   Comprehensive metabolic panel   Immunoglobulins, QN, A/E/G/M   Ambulatory  referral to Family Practice    Follow Up Instructions:  - Return in about 6 months (around 12/15/2021).     Penni Bombard, MD [id number], [id number] PM Certified in Neurology, Neurophysiology and Neuroimaging  [facility] [address], [place], [place] [address] [phone]

## 2021-06-14 NOTE — Telephone Encounter (Signed)
Referral sent to [facility]. P: [phone]

## 2021-06-14 NOTE — Patient Instructions (Signed)
  MULTIPLE SCLEROSIS (relapsing, remitting multiple sclerosis; established, stable) - continue ocrevus - continue exercises and PT - continue vitamin D - monitor CBC, CMP - check MRI brain   LEFT LEG PAIN / BACK PAIN (persistent; sciatica; ? piriformis syndrome) - check MRI lumbar spine - follow up with Dr. Jimmye Norman (chiro); limitation of sitting, standing and mobility due to pain   OBESITY - refer to medical weight mgmt clinic

## 2021-06-15 ENCOUNTER — Other Ambulatory Visit: Payer: Self-pay

## 2021-06-15 ENCOUNTER — Ambulatory Visit
Admission: RE | Admit: 2021-06-15 | Discharge: 2021-06-15 | Disposition: A | Discharge status: Home / Self Care | Payer: Medicare Other | Source: Ambulatory Visit | Attending: Physician Assistant | Admitting: Physician Assistant

## 2021-06-15 DIAGNOSIS — Z1231 Encounter for screening mammogram for malignant neoplasm of breast: Secondary | ICD-10-CM

## 2021-06-15 NOTE — Progress Notes (Signed)
Ocrevus Rx placed on MD desk for review signature. Signed and given to [employer], infusion ste.

## 2021-06-18 LAB — COMPREHENSIVE METABOLIC PANEL
ALT: 43 IU/L — ABNORMAL HIGH (ref 0–32)
AST: 37 IU/L (ref 0–40)
Albumin/Globulin Ratio: 1.9 (ref 1.2–2.2)
Albumin: 4.2 g/dL (ref 3.8–4.9)
Alkaline Phosphatase: 96 IU/L (ref 44–121)
BUN/Creatinine Ratio: 15 (ref 9–23)
BUN: 11 mg/dL (ref 6–24)
Bilirubin Total: <0.2 mg/dL (ref 0.0–1.2)
CO2: 23 mmol/L (ref 20–29)
Calcium: 10.7 mg/dL — ABNORMAL HIGH (ref 8.7–10.2)
Chloride: 105 mmol/L (ref 96–106)
Creatinine, Ser: 0.72 mg/dL (ref 0.57–1.00)
Globulin, Total: 2.2 g/dL (ref 1.5–4.5)
Glucose: 103 mg/dL — ABNORMAL HIGH (ref 65–99)
Potassium: 4.2 mmol/L (ref 3.5–5.2)
Sodium: 139 mmol/L (ref 134–144)
Total Protein: 6.4 g/dL (ref 6.0–8.5)
eGFR: 101 mL/min/{1.73_m2} (ref >59)

## 2021-06-18 LAB — CBC WITH DIFFERENTIAL/PLATELET
Basophils Absolute: 0.1 10*3/uL (ref 0.0–0.2)
Basos: 1 %
EOS (ABSOLUTE): 0.5 10*3/uL — ABNORMAL HIGH (ref 0.0–0.4)
Eos: 5 %
Hematocrit: 34.4 % (ref 34.0–46.6)
Hemoglobin: 10.3 g/dL — ABNORMAL LOW (ref 11.1–15.9)
Immature Grans (Abs): 0 10*3/uL (ref 0.0–0.1)
Immature Granulocytes: 0 %
Lymphocytes Absolute: 3 10*3/uL (ref 0.7–3.1)
Lymphs: 28 %
MCH: 21.1 pg — ABNORMAL LOW (ref 26.6–33.0)
MCHC: 29.9 g/dL — ABNORMAL LOW (ref 31.5–35.7)
MCV: 70 fL — ABNORMAL LOW (ref 79–97)
Monocytes Absolute: 0.8 10*3/uL (ref 0.1–0.9)
Monocytes: 7 %
Neutrophils Absolute: 6.4 10*3/uL (ref 1.4–7.0)
Neutrophils: 59 %
Platelets: 299 10*3/uL (ref 150–450)
RBC: 4.89 x10E6/uL (ref 3.77–5.28)
RDW: 18.1 % — ABNORMAL HIGH (ref 11.7–15.4)
WBC: 10.8 10*3/uL (ref 3.4–10.8)

## 2021-06-18 LAB — IMMUNOGLOBULINS A/E/G/M, SERUM
IgA/Immunoglobulin A, Serum: 240 mg/dL (ref 87–352)
IgE (Immunoglobulin E), Serum: 22 IU/mL (ref 6–495)
IgG (Immunoglobin G), Serum: 811 mg/dL (ref 586–1602)
IgM (Immunoglobulin M), Srm: 21 mg/dL — ABNORMAL LOW (ref 26–217)

## 2021-06-25 ENCOUNTER — Ambulatory Visit
Admission: RE | Admit: 2021-06-25 | Discharge: 2021-06-25 | Disposition: A | Discharge status: Home / Self Care | Payer: Medicare Other | Source: Ambulatory Visit | Attending: Diagnostic Neuroimaging | Admitting: Diagnostic Neuroimaging

## 2021-06-25 DIAGNOSIS — G35 Multiple sclerosis: Secondary | ICD-10-CM

## 2021-06-25 DIAGNOSIS — R5383 Other fatigue: Secondary | ICD-10-CM

## 2021-06-25 DIAGNOSIS — M4807 Spinal stenosis, lumbosacral region: Secondary | ICD-10-CM | POA: Diagnosis not present

## 2021-06-25 MED ORDER — GADOBENATE DIMEGLUMINE 529 MG/ML IV SOLN
20.0000 mL | Freq: Once | INTRAVENOUS | Status: AC | PRN
  Administered 2021-06-25: 20 mL via INTRAVENOUS

## 2021-07-01 ENCOUNTER — Other Ambulatory Visit (HOSPITAL_COMMUNITY): Payer: Self-pay

## 2021-07-01 MED ORDER — AMLODIPINE BESYLATE 5 MG PO TABS
ORAL_TABLET | ORAL | 3 refills | Status: DC
  Filled 2021-07-01: qty 90, 90d supply, fill #0
  Filled 2021-10-14: qty 90, 90d supply, fill #1
  Filled 2022-01-29: qty 90, 90d supply, fill #2

## 2021-07-01 MED ORDER — VARENICLINE TARTRATE 1 MG PO TABS
ORAL_TABLET | ORAL | 5 refills | Status: DC
  Filled 2021-07-01: qty 60, 30d supply, fill #0

## 2021-07-01 MED ORDER — VALACYCLOVIR HCL 1 G PO TABS
ORAL_TABLET | ORAL | 0 refills | Status: AC
  Filled 2021-07-01 – 2021-07-22 (×2): qty 14, 7d supply, fill #0

## 2021-07-01 MED ORDER — HYDROCODONE-ACETAMINOPHEN 7.5-325 MG PO TABS
ORAL_TABLET | ORAL | 0 refills | Status: DC
  Filled 2021-07-01: qty 30, 7d supply, fill #0

## 2021-07-01 MED ORDER — TELMISARTAN-HCTZ 80-25 MG PO TABS
1.0000 | ORAL_TABLET | Freq: Every day | ORAL | 3 refills | Status: DC
  Filled 2021-07-01: qty 90, 90d supply, fill #0
  Filled 2021-10-05: qty 90, 90d supply, fill #1

## 2021-07-02 ENCOUNTER — Other Ambulatory Visit (HOSPITAL_COMMUNITY): Payer: Self-pay

## 2021-07-09 ENCOUNTER — Other Ambulatory Visit (HOSPITAL_COMMUNITY): Payer: Self-pay

## 2021-07-09 MED ORDER — METFORMIN HCL ER 500 MG PO TB24
ORAL_TABLET | ORAL | 5 refills | Status: DC
  Filled 2021-07-09: qty 30, 30d supply, fill #0

## 2021-07-09 MED ORDER — ATORVASTATIN CALCIUM 10 MG PO TABS
ORAL_TABLET | ORAL | 5 refills | Status: DC
  Filled 2021-07-09: qty 30, 30d supply, fill #0

## 2021-07-10 ENCOUNTER — Other Ambulatory Visit (HOSPITAL_COMMUNITY): Payer: Self-pay

## 2021-07-13 ENCOUNTER — Other Ambulatory Visit (HOSPITAL_COMMUNITY): Payer: Self-pay | Admitting: Surgery

## 2021-07-13 ENCOUNTER — Other Ambulatory Visit: Payer: Self-pay | Admitting: Surgery

## 2021-07-13 DIAGNOSIS — R7303 Prediabetes: Secondary | ICD-10-CM

## 2021-07-13 DIAGNOSIS — G35 Multiple sclerosis: Secondary | ICD-10-CM

## 2021-07-15 ENCOUNTER — Other Ambulatory Visit (HOSPITAL_COMMUNITY): Payer: Self-pay

## 2021-07-21 ENCOUNTER — Ambulatory Visit (HOSPITAL_COMMUNITY)
Admission: RE | Admit: 2021-07-21 | Discharge: 2021-07-21 | Disposition: A | Discharge status: Home / Self Care | Payer: Medicare Other | Source: Ambulatory Visit | Attending: Surgery | Admitting: Surgery

## 2021-07-21 ENCOUNTER — Other Ambulatory Visit: Payer: Self-pay

## 2021-07-21 DIAGNOSIS — G35 Multiple sclerosis: Secondary | ICD-10-CM | POA: Diagnosis present

## 2021-07-21 DIAGNOSIS — R7303 Prediabetes: Secondary | ICD-10-CM | POA: Diagnosis present

## 2021-07-22 ENCOUNTER — Other Ambulatory Visit (HOSPITAL_COMMUNITY): Payer: Self-pay

## 2021-08-02 ENCOUNTER — Other Ambulatory Visit (HOSPITAL_COMMUNITY): Payer: Self-pay

## 2021-08-03 ENCOUNTER — Other Ambulatory Visit (HOSPITAL_COMMUNITY): Payer: Self-pay

## 2021-08-03 MED ORDER — ATORVASTATIN CALCIUM 10 MG PO TABS
ORAL_TABLET | ORAL | 1 refills | Status: DC
  Filled 2021-08-03: qty 90, 90d supply, fill #0
  Filled 2021-11-02: qty 90, 90d supply, fill #1

## 2021-08-03 MED ORDER — METFORMIN HCL ER 500 MG PO TB24
ORAL_TABLET | ORAL | 1 refills | Status: DC
  Filled 2021-08-03: qty 90, 90d supply, fill #0
  Filled 2021-11-02: qty 90, 90d supply, fill #1

## 2021-08-04 ENCOUNTER — Other Ambulatory Visit (HOSPITAL_COMMUNITY): Payer: Self-pay

## 2021-08-10 ENCOUNTER — Other Ambulatory Visit (HOSPITAL_COMMUNITY): Payer: Self-pay

## 2021-08-16 ENCOUNTER — Other Ambulatory Visit (HOSPITAL_COMMUNITY): Payer: Self-pay

## 2021-08-16 MED ORDER — HYDROCODONE-ACETAMINOPHEN 7.5-325 MG PO TABS
ORAL_TABLET | ORAL | 0 refills | Status: DC
  Filled 2021-08-16: qty 30, 7d supply, fill #0

## 2021-08-18 ENCOUNTER — Other Ambulatory Visit: Payer: Self-pay

## 2021-08-18 ENCOUNTER — Encounter: Payer: Self-pay | Admitting: Skilled Nursing Facility1

## 2021-08-18 ENCOUNTER — Encounter: Payer: Medicare Other | Attending: Surgery | Admitting: Skilled Nursing Facility1

## 2021-08-18 DIAGNOSIS — I10 Essential (primary) hypertension: Secondary | ICD-10-CM | POA: Insufficient documentation

## 2021-08-18 DIAGNOSIS — G35 Multiple sclerosis: Secondary | ICD-10-CM | POA: Diagnosis not present

## 2021-08-18 DIAGNOSIS — E119 Type 2 diabetes mellitus without complications: Secondary | ICD-10-CM | POA: Diagnosis not present

## 2021-08-18 DIAGNOSIS — Z713 Dietary counseling and surveillance: Secondary | ICD-10-CM | POA: Diagnosis not present

## 2021-08-18 NOTE — Progress Notes (Signed)
Nutrition Assessment for Bariatric Surgery Medical Nutrition Therapy Appt Start Time: 10:25    End Time: 11:30  Patient was seen on 08/18/2021 for Pre-Operative Nutrition Assessment. Letter of approval faxed to [facility] bariatric surgery program coordinator on 08/18/2021.   Referral stated Supervised Weight Loss (SWL) visits needed: 0  Pt completed visits.   Pt has cleared nutrition requirements.    Planned surgery: sleeve gastrectomy  Pt expectation of surgery: to lose weight Pt expectation of dietitian: to be a Barrister's clerk     NUTRITION ASSESSMENT   Anthropometrics  Start weight at [facility]: 307 lbs (date: 08/18/2021)  Height: 63 in BMI: 54.38 kg/m2     Clinical  Medical hx: DM,  Medications: metformin   Labs: A1C 6.5 Notable signs/symptoms: MS pain Any previous deficiencies? No  Micronutrient Nutrition Focused Physical Exam: Hair: No issues observed Eyes: No issues observed Mouth: No issues observed Neck: No issues observed Nails: No issues observed Skin: No issues observed  Lifestyle & Dietary Hx  Pt arrives with her seemingly supportive daughter.  Pt state she had a previous assessment but then lost her insurance so never got the surgery.   Pt states she got down during the time she lost her insurance so she has not been working on much.  Pt states she currently smokes: Dietitian educated pt on this topic. Pt states she is going to stop after todays appt.  Pt states her daughters cook her meals for her: Dietitian dvsied pts daughter if she wants  her to be successful she is going to have to make more meals  24-Hr Dietary Recall First Meal: fast food or banana + yogurt  Snack:  Second Meal: fast food  Snack: chips Third Meal: 2 burger patties + mustard + mashed potatoes  Snack: popcorn Beverages: water, unsweet tea   Estimated Energy Needs Calories: 1500   NUTRITION DIAGNOSIS  Overweight/obesity ([place]-3.3) related to past poor dietary  habits and physical inactivity as evidenced by patient w/ planned sleeve gastrectomy surgery following dietary guidelines for continued weight loss.    NUTRITION INTERVENTION  Nutrition counseling (C-1) and education (E-2) to facilitate bariatric surgery goals.  Educated pt on micronutrient deficiencies post surgery and strategies to mitigate that risk Educated pts daughter on support for success inclusive of education on balanced meals, eating out minimally throughout the week, and smoking cessation   Pre-Op Goals Reviewed with the Patient Track food and beverage intake (pen and paper, MyFitness Pal, Baritastic app, etc.) Make healthy food choices while monitoring portion sizes Consume 3 meals per day or try to eat every 3-5 hours Avoid concentrated sugars and fried foods Keep sugar & fat in the single digits per serving on food labels Practice CHEWING your food (aim for applesauce consistency) Practice not drinking 15 minutes before, during, and 30 minutes after each meal and snack Avoid all carbonated beverages (ex: soda, sparkling beverages)  Limit caffeinated beverages (ex: coffee, tea, energy drinks) Avoid all sugar-sweetened beverages (ex: regular soda, sports drinks)  Avoid alcohol  Aim for 64-100 ounces of FLUID daily (with at least half of fluid intake being plain water)  Aim for at least 60-80 grams of PROTEIN daily Look for a liquid protein source that contains ?15 g protein and ?5 g carbohydrate (ex: shakes, drinks, shots) Make a list of non-food related activities Physical activity is an important part of a healthy lifestyle so keep it moving! The goal is to reach 150 minutes of exercise per week, including cardiovascular and  weight baring activity.  *Goals that are bolded indicate the pt would like to start working towards these  Handouts Provided Include  Bariatric Surgery handouts (Nutrition Visits, Pre-Op Goals, Protein Shakes, Vitamins & Minerals)  Learning Style &  Readiness for Change Teaching method utilized: Visual & Auditory  Demonstrated degree of understanding via: Teach Back  Readiness Level: contemplative  Barriers to learning/adherence to lifestyle change: immobility    MONITORING & EVALUATION Dietary intake, weekly physical activity, body weight, and pre-op goals reached at next nutrition visit.    Next Steps  Patient is to follow up at [facility] for Pre-Op Class >2 weeks before surgery for further nutrition education.  Pt has completed visits. No further supervised visits required/recomended

## 2021-08-20 ENCOUNTER — Other Ambulatory Visit (HOSPITAL_COMMUNITY): Payer: Self-pay

## 2021-08-20 MED ORDER — ACCU-CHEK GUIDE W/DEVICE KIT
PACK | 99 refills | Status: DC
  Filled 2021-08-20: qty 1, 30d supply, fill #0

## 2021-08-20 MED ORDER — ACCU-CHEK FASTCLIX LANCETS MISC
99 refills | Status: DC
  Filled 2021-08-20: qty 102, 51d supply, fill #0

## 2021-08-20 MED ORDER — GLUCOSE BLOOD VI STRP
ORAL_STRIP | 99 refills | Status: DC
  Filled 2021-08-20: qty 100, 50d supply, fill #0

## 2021-08-21 ENCOUNTER — Other Ambulatory Visit (HOSPITAL_COMMUNITY): Payer: Self-pay

## 2021-08-24 ENCOUNTER — Other Ambulatory Visit (HOSPITAL_COMMUNITY): Payer: Self-pay

## 2021-08-25 ENCOUNTER — Other Ambulatory Visit (HOSPITAL_COMMUNITY): Payer: Self-pay

## 2021-09-16 ENCOUNTER — Ambulatory Visit (INDEPENDENT_AMBULATORY_CARE_PROVIDER_SITE_OTHER): Payer: Medicare Other | Admitting: Psychology

## 2021-09-16 DIAGNOSIS — F509 Eating disorder, unspecified: Secondary | ICD-10-CM | POA: Diagnosis not present

## 2021-09-29 ENCOUNTER — Ambulatory Visit (INDEPENDENT_AMBULATORY_CARE_PROVIDER_SITE_OTHER): Payer: Medicare Other | Admitting: Psychology

## 2021-09-29 DIAGNOSIS — F509 Eating disorder, unspecified: Secondary | ICD-10-CM | POA: Diagnosis not present

## 2021-10-05 ENCOUNTER — Other Ambulatory Visit (HOSPITAL_COMMUNITY): Payer: Self-pay

## 2021-10-06 ENCOUNTER — Other Ambulatory Visit (HOSPITAL_COMMUNITY): Payer: Self-pay

## 2021-10-14 ENCOUNTER — Other Ambulatory Visit (HOSPITAL_COMMUNITY): Payer: Self-pay

## 2021-11-02 ENCOUNTER — Other Ambulatory Visit (HOSPITAL_COMMUNITY): Payer: Self-pay

## 2021-11-03 ENCOUNTER — Other Ambulatory Visit (HOSPITAL_COMMUNITY): Payer: Self-pay

## 2021-11-15 ENCOUNTER — Encounter: Payer: Medicare Other | Attending: Surgery | Admitting: Skilled Nursing Facility1

## 2021-11-15 DIAGNOSIS — I10 Essential (primary) hypertension: Secondary | ICD-10-CM | POA: Diagnosis not present

## 2021-11-15 DIAGNOSIS — Z713 Dietary counseling and surveillance: Secondary | ICD-10-CM | POA: Diagnosis not present

## 2021-11-15 DIAGNOSIS — R7303 Prediabetes: Secondary | ICD-10-CM | POA: Insufficient documentation

## 2021-11-16 ENCOUNTER — Other Ambulatory Visit: Payer: Self-pay

## 2021-11-16 NOTE — Progress Notes (Signed)
Pre-Operative Nutrition Class:   ° °Patient was seen on 11/16/2021 for Pre-Operative Bariatric Surgery Education [facility]   ° °Surgery date:  °Surgery type: sleeve °Start weight at [facility]: 307 pounds °Weight today: appt conducted virtually via webex: pt agreed to the limitations of this visit type; pt identified by name and DOB; conducted virtually in accordance with COVID protocols  ° ° °The following the learning objectives were met by the patient during this course: °Identify Pre-Op Dietary Goals and will begin 2 weeks pre-operatively °Identify appropriate sources of fluids and proteins  °State protein recommendations and appropriate sources pre and post-operatively °Identify Post-Operative Dietary Goals and will follow for 2 weeks post-operatively °Identify appropriate multivitamin and calcium sources °Describe the need for physical activity post-operatively and will follow MD recommendations °State when to call healthcare provider regarding medication questions or post-operative complications °When having a diagnosis of diabetes understanding hypoglycemia symptoms and the inclusion of 1 complex carbohydrate per meal ° °Handouts given during class include: °Pre-Op Bariatric Surgery Diet Handout °Protein Shake Handout °Post-Op Bariatric Surgery Nutrition Handout °[facility] Program Information Flyer °Support Group Information Flyer °[facility] Bariatric Supplements Price List ° °Follow-Up Plan: °Patient will follow-up at [facility] 2 weeks post operatively for diet advancement per MD. ° °

## 2021-11-25 NOTE — Progress Notes (Signed)
Sent message, via epic in basket, requesting orders in epic from surgeon.  

## 2021-12-06 ENCOUNTER — Telehealth: Payer: Self-pay | Admitting: Skilled Nursing Facility1

## 2021-12-06 NOTE — Patient Instructions (Addendum)
DUE TO COVID-19 ONLY ONE VISITOR IS ALLOWED TO COME WITH YOU AND STAY IN THE WAITING ROOM ONLY DURING PRE OP AND PROCEDURE DAY OF SURGERY IF YOU ARE GOING HOME AFTER SURGERY. IF YOU ARE SPENDING THE NIGHT 2 PEOPLE MAY VISIT WITH YOU IN YOUR PRIVATE ROOM AFTER SURGERY UNTIL VISITING  HOURS ARE OVER AT 800 PM AND 1  VISITOR  MAY  SPEND THE NIGHT.   YOU NEED TO HAVE A COVID 19 TEST ON__1/26/23_____ @_12 :45 pm______, THIS TEST MUST BE DONE BEFORE SURGERY,                 [name]     Your procedure is scheduled on: 12/13/21   Report to [facility] Main  Entrance   Report to Short stay at 5:15 AM     Call this number if you have problems the morning of surgery [phone]      CLEAR LIQUID DIET   Foods Allowed                                                                     Foods Excluded  Coffee and tea, regular and decaf                             liquids that you cannot  Plain Jell-O any favor except red or purple                                           see through such as: Fruit ices (not with fruit pulp)                                     milk, soups, orange juice  Iced Popsicles                                    All solid food Carbonated beverages, regular and diet                                    Cranberry, grape and apple juices Sports drinks like Gatorade Lightly seasoned clear broth or consume(fat free)  No food after 6:00 pm the night befor surgery .   Have a clear liquid diet until 4:30 AM the day of surgery.    MORNING OF SURGERY DRINK:   DRINK 1 G2 drink BEFORE YOU LEAVE HOME, DRINK ALL OF THE  G2 DRINK AT ONE TIME.     YOU MAY DRINK CLEAR FLUIDS. THE G2 DRINK YOU DRINK BEFORE YOU LEAVE HOME WILL BE THE LAST FLUIDS YOU DRINK BEFORE SURGERY.  PAIN IS EXPECTED AFTER SURGERY AND WILL NOT BE COMPLETELY ELIMINATED.   AMBULATION AND TYLENOL WILL HELP REDUCE INCISIONAL AND GAS PAIN. MOVEMENT IS KEY!  YOU ARE EXPECTED TO BE OUT OF BED WITHIN 4  HOURS OF ADMISSION TO YOUR PATIENT ROOM.  SITTING IN THE RECLINER THROUGHOUT THE DAY IS IMPORTANT FOR DRINKING FLUIDS AND MOVING GAS THROUGHOUT THE GI TRACT.  COMPRESSION STOCKINGS SHOULD BE WORN [place] UNLESS YOU ARE WALKING.   INCENTIVE SPIROMETER SHOULD BE USED EVERY HOUR WHILE AWAKE TO DECREASE POST-OPERATIVE COMPLICATIONS SUCH AS PNEUMONIA.  WHEN DISCHARGED HOME, IT IS IMPORTANT TO CONTINUE TO WALK EVERY HOUR AND USE THE INCENTIVE SPIROMETER EVERY HOUR.   Nothing more by mouth  after 4:30 am.       BRUSH YOUR TEETH MORNING OF SURGERY AND RINSE YOUR MOUTH OUT, NO CHEWING GUM CANDY OR MINTS.     Take these medicines the morning of surgery with A SIP OF WATER: Amlodipine               Bring mask and tubing to the hospital                  You may not have any metal on your body including hair pins and              piercings  Do not wear jewelry, make-up, lotions, powders or perfumes, deodorant             Do not wear nail polish on your fingernails.  Do not shave  48 hours prior to surgery.               Do not bring valuables to the hospital. [place].  Contacts, dentures or bridgework may not be worn into surgery.  Leave suitcase in the car. After surgery it may be brought to your room.   _____________________________________________________________________  [facility] - Preparing for Surgery  Before surgery, you can play an important role.  Because skin is not sterile, your skin needs to be as free of germs as possible.  You can reduce the number of germs on your skin by washing with CHG (chlorahexidine gluconate) soap before surgery.  CHG is an antiseptic cleaner which kills germs and bonds with the skin to continue killing germs even after washing. Please DO NOT use if you have an allergy to CHG or antibacterial soaps.  If your skin becomes reddened/irritated stop using the CHG and inform your nurse  when you arrive at Short Stay. Do not shave (including legs and underarms) for at least 48 hours prior to the first CHG shower.    Please follow these instructions carefully:  1.  Shower with CHG Soap the night before surgery and the  morning of Surgery.  2.  If you choose to wash your hair, wash your hair first as usual with your  normal  shampoo.  3.  After you shampoo, rinse your hair and body thoroughly to remove the  shampoo.                            4.  Use CHG as you would any other liquid soap.  You can apply chg directly  to the skin and wash                       Gently with a scrungie or clean washcloth.  5.  Apply the CHG Soap to your body ONLY FROM THE NECK DOWN.   Do not use on face/ open  Wound or open sores. Avoid contact with eyes, ears mouth and genitals (private parts).                       Wash face,  Genitals (private parts) with your normal soap.             6.  Wash thoroughly, paying special attention to the area where your surgery  will be performed.  7.  Thoroughly rinse your body with warm water from the neck down.  8.  DO NOT shower/wash with your normal soap after using and rinsing off  the CHG Soap.             9.  Pat yourself dry with a clean towel.            10.  Wear clean pajamas.            11.  Place clean sheets on your bed the night of your first shower and do not  sleep with pets. Day of Surgery : Do not apply any lotions/deodorants the morning of surgery.  Please wear clean clothes to the hospital/surgery center.  FAILURE TO FOLLOW THESE INSTRUCTIONS MAY RESULT IN THE CANCELLATION OF YOUR SURGERY PATIENT SIGNATURE_________________________________  NURSE SIGNATURE__________________________________  ________________________________________________________________________   Adam Phenix  An incentive spirometer is a tool that can help keep your lungs clear and active. This tool measures how well you are filling  your lungs with each breath. Taking long deep breaths may help reverse or decrease the chance of developing breathing (pulmonary) problems (especially infection) following: A long period of time when you are unable to move or be active. BEFORE THE PROCEDURE  If the spirometer includes an indicator to show your best effort, your nurse or respiratory therapist will set it to a desired goal. If possible, sit up straight or lean slightly forward. Try not to slouch. Hold the incentive spirometer in an upright position. INSTRUCTIONS FOR USE  Sit on the edge of your bed if possible, or sit up as far as you can in bed or on a chair. Hold the incentive spirometer in an upright position. Breathe out normally. Place the mouthpiece in your mouth and seal your lips tightly around it. Breathe in slowly and as deeply as possible, raising the piston or the ball toward the top of the column. Hold your breath for 3-5 seconds or for as long as possible. Allow the piston or ball to fall to the bottom of the column. Remove the mouthpiece from your mouth and breathe out normally. Rest for a few seconds and repeat Steps 1 through 7 at least 10 times every 1-2 hours when you are awake. Take your time and take a few normal breaths between deep breaths. The spirometer may include an indicator to show your best effort. Use the indicator as a goal to work toward during each repetition. After each set of 10 deep breaths, practice coughing to be sure your lungs are clear. If you have an incision (the cut made at the time of surgery), support your incision when coughing by placing a pillow or rolled up towels firmly against it. Once you are able to get out of bed, walk around indoors and cough well. You may stop using the incentive spirometer when instructed by your caregiver.  RISKS AND COMPLICATIONS Take your time so you do not get dizzy or light-headed. If you are in pain, you may need to take or ask for pain medication  before doing incentive spirometry. It is harder to take a deep breath if you are having pain. AFTER USE Rest and breathe slowly and easily. It can be helpful to keep track of a log of your progress. Your caregiver can provide you with a simple table to help with this. If you are using the spirometer at home, follow these instructions: [place] IF:  You are having difficultly using the spirometer. You have trouble using the spirometer as often as instructed. Your pain medication is not giving enough relief while using the spirometer. You develop fever of 100.5 F (38.1 C) or higher. SEEK IMMEDIATE MEDICAL CARE IF:  You cough up bloody sputum that had not been present before. You develop fever of 102 F (38.9 C) or greater. You develop worsening pain at or near the incision site. MAKE SURE YOU:  Understand these instructions. Will watch your condition. Will get help right away if you are not doing well or get worse. Document Released: 03/13/2007 Document Revised: 01/23/2012 Document Reviewed: 05/14/2007 [facility] Patient Information 2014 [place], [place].   ________________________________________________________________________

## 2021-12-06 NOTE — Telephone Encounter (Signed)
Returned pts phone call.   Pt states she has been following the liquid diet before surgery.   Dietitian advised pt to stop following the liquid diet and to eat solid foods up until 6 pm the night before her surgery date.   Dietitian gave pt specific meal ideas and to read her pre-op handouts.

## 2021-12-07 ENCOUNTER — Encounter (HOSPITAL_COMMUNITY)
Admission: RE | Admit: 2021-12-07 | Discharge: 2021-12-07 | Disposition: A | Discharge status: Home / Self Care | Payer: Medicare Other | Source: Ambulatory Visit | Attending: Surgery | Admitting: Surgery

## 2021-12-07 ENCOUNTER — Encounter (HOSPITAL_COMMUNITY): Payer: Self-pay

## 2021-12-07 ENCOUNTER — Other Ambulatory Visit: Payer: Self-pay

## 2021-12-07 VITALS — BP 126/75 | HR 86 | Temp 98.2°F | Resp 18 | Ht 64.0 in | Wt 299.0 lb

## 2021-12-07 DIAGNOSIS — R7303 Prediabetes: Secondary | ICD-10-CM | POA: Insufficient documentation

## 2021-12-07 DIAGNOSIS — Z01818 Encounter for other preprocedural examination: Secondary | ICD-10-CM

## 2021-12-07 DIAGNOSIS — Z01812 Encounter for preprocedural laboratory examination: Secondary | ICD-10-CM | POA: Insufficient documentation

## 2021-12-07 HISTORY — DX: Prediabetes: R73.03

## 2021-12-07 LAB — CBC
HCT: 38 % (ref 36.0–46.0)
Hemoglobin: 11.4 g/dL — ABNORMAL LOW (ref 12.0–15.0)
MCH: 21.2 pg — ABNORMAL LOW (ref 26.0–34.0)
MCHC: 30 g/dL (ref 30.0–36.0)
MCV: 70.5 fL — ABNORMAL LOW (ref 80.0–100.0)
Platelets: 356 10*3/uL (ref 150–400)
RBC: 5.39 MIL/uL — ABNORMAL HIGH (ref 3.87–5.11)
RDW: 19.6 % — ABNORMAL HIGH (ref 11.5–15.5)
WBC: 10.3 10*3/uL (ref 4.0–10.5)
nRBC: 0 % (ref 0.0–0.2)

## 2021-12-07 LAB — BASIC METABOLIC PANEL
Anion gap: 8 (ref 5–15)
BUN: 14 mg/dL (ref 6–20)
CO2: 26 mmol/L (ref 22–32)
Calcium: 10.6 mg/dL — ABNORMAL HIGH (ref 8.9–10.3)
Chloride: 104 mmol/L (ref 98–111)
Creatinine, Ser: 0.61 mg/dL (ref 0.44–1.00)
GFR, Estimated: >60 mL/min (ref >60)
Glucose, Bld: 115 mg/dL — ABNORMAL HIGH (ref 70–99)
Potassium: 3.4 mmol/L — ABNORMAL LOW (ref 3.5–5.1)
Sodium: 138 mmol/L (ref 135–145)

## 2021-12-07 LAB — GLUCOSE, CAPILLARY: Glucose-Capillary: 113 mg/dL — ABNORMAL HIGH (ref 70–99)

## 2021-12-07 NOTE — Progress Notes (Signed)
COVID test- 12/09/21 at 12:45 p  PCP -Harrison Mons PA  Cardiologist - none  Chest x-ray - 07/21/21-epic EKG - 07/21/21-epic Stress Test - no ECHO - no Cardiac Cath - no Pacemaker/ICD device last checked:NA  Sleep Study - yes CPAP - yes  Fasting Blood Sugar - 80-90. Pre-diabetic Checks Blood Sugar _____ times a day.,. as needed if she feels"bad"  Blood Thinner Instructions:NA Aspirin Instructions: Last Dose:  Anesthesia review: yes  Patient denies shortness of breath, fever, cough and chest pain at PAT appointment Pt gets SOB on 1 flight of stairs. Her BMI is 51.3.She has MS and sciatic pain in her legs and doesn't move around much.  Patient verbalized understanding of instructions that were given to them at the PAT appointment. Patient was also instructed that they will need to review over the PAT instructions again at home before surgery. yes

## 2021-12-09 ENCOUNTER — Encounter (HOSPITAL_COMMUNITY)
Admission: RE | Admit: 2021-12-09 | Discharge: 2021-12-09 | Disposition: A | Discharge status: Home / Self Care | Payer: Medicare Other | Source: Ambulatory Visit | Attending: Surgery | Admitting: Surgery

## 2021-12-09 ENCOUNTER — Other Ambulatory Visit: Payer: Self-pay

## 2021-12-09 DIAGNOSIS — Z01812 Encounter for preprocedural laboratory examination: Secondary | ICD-10-CM | POA: Diagnosis present

## 2021-12-09 DIAGNOSIS — Z20822 Contact with and (suspected) exposure to covid-19: Secondary | ICD-10-CM | POA: Diagnosis not present

## 2021-12-09 DIAGNOSIS — Z01818 Encounter for other preprocedural examination: Secondary | ICD-10-CM

## 2021-12-10 LAB — SARS CORONAVIRUS 2 (TAT 6-24 HRS): SARS Coronavirus 2: NEGATIVE

## 2021-12-11 NOTE — H&P (Signed)
History of Present Illness: [name] is a 51 y.o. female who is seen today as an office consultation at the request of Ala Bent, [place] [phone]) for evaluation of Pre-op Exam .   She and her oldest daughter came in today for preop visit. Her weight is 309 her height is 5 foot 2 and her weight BMI is 56.6. She brought along her vitamins which were reviewed which will will take after her sleeve gastrectomy. We discussed water consumption and she is read up on this and she is obviously motivated and on it.  I went over the procedure again in in some detail but again she is fully versed on this. She has a very good fund of knowledge. She is aware of the risk and benefits  Review of Systems: See HPI as well for other ROS.  ROS   Medical History: Past Medical History:  Diagnosis Date   Anemia   Anxiety   GERD (gastroesophageal reflux disease)   Hypertension   Sleep apnea   Patient Active Problem List  Diagnosis   Morbid (severe) obesity due to excess calories (CMS-HCC)   Adjustment disorder with mixed anxiety and depressed mood   Diabetes mellitus type 2 in obese (CMS-HCC)   Facet arthropathy, lumbosacral   Former smoker   Heartburn   High risk medication use   History of COVID-19   HSV-2 seropositive   HTN (hypertension)   Lumbar disc herniation with radiculopathy   Migraine without aura and without status migrainosus, not intractable   MS (multiple sclerosis) (CMS-HCC)   OSA on CPAP   Sickle cell trait (CMS-HCC)   Smoker   Past Surgical History:  Procedure Laterality Date   CESAREAN SECTION  x3   CHOLECYSTECTOMY   LAPAROSCOPIC TUBAL LIGATION  1995    Allergies  Allergen Reactions   Iron Shortness Of Breath  IV only  IV only    Natalizumab Other (See Comments)  Shortness of breath, joint pain, tremors Shortness of breath, joint pain, tremors   Current Outpatient Medications on File Prior to Visit  Medication Sig Dispense Refill   amLODIPine (NORVASC)  5 MG tablet Take one tablet (5 mg dose) by mouth daily.   atorvastatin (LIPITOR) 10 MG tablet Take one tablet (10 mg dose) by mouth daily.   HYDROcodone-acetaminophen (NORCO) 7.5-325 mg tablet Take one tablet by mouth every 6 (six) hours as needed for Pain.   ibuprofen (MOTRIN) 200 MG tablet Take by mouth   metFORMIN (GLUCOPHAGE-XR) 500 MG XR tablet Take by mouth   telmisartan-hydrochlorothiazide (MICARDIS HCT) 80-25 mg tablet Take 1 tablet by mouth once daily   valACYclovir (VALTREX) 1000 MG tablet Take one tablet by mouth 2 times daily. Start with first symptom of outbreak.   zolpidem (AMBIEN) 10 mg tablet Take by mouth   No current facility-administered medications on file prior to visit.   Family History  Problem Relation Age of Onset   High blood pressure (Hypertension) Mother   Hyperlipidemia (Elevated cholesterol) Mother   Diabetes Mother    Social History   Tobacco Use  Smoking Status Every Day  Smokeless Tobacco Never    Social History   Socioeconomic History   Marital status: Unknown  Tobacco Use   Smoking status: Every Day   Smokeless tobacco: Never  Substance and Sexual Activity   Alcohol use: Not Currently   Drug use: Not Currently   Objective:   Vitals:  11/25/21 1131  Weight: (!) 140.4 kg (309 lb 9.6 oz)  Height: 157.5 cm (5\' 2" )   Body mass index is 56.63 kg/m.  Physical Exam General: Obese African-American lady in no acute distress HEENT : Some hair thinning in the front Chest: Clear to auscultation Heart: Sinus rhythm without murmurs or gallops Breast: Not examined Abdomen: Nontender GU not examined Rectal not performed Extremities full range of motion although she does have sciatica. Its not clear to me whether her diagnosis of MS is spot on because she has had no further progression and mainly was having back pain which could well be from her weight. We discussed using abdominal binder perioperatively to support her back Neuro alert and  oriented x3. Motor and sensory function grossly intact.  Labs, Imaging and Diagnostic Testing: None were reviewed  Assessment and Plan:  Diagnoses and all orders for this visit:  Morbid (severe) obesity due to excess calories (CMS-HCC)    Preop visit for sleeve gastrectomy toward the end of the month. She is excited and ready to pursue this. I discussed sleeve gastrectomy on the robot with her.  Seen in preop and questions answered.   Aniella Wandrey Donia Pounds, MD

## 2021-12-12 ENCOUNTER — Encounter (HOSPITAL_COMMUNITY): Payer: Self-pay | Admitting: Surgery

## 2021-12-12 NOTE — Anesthesia Preprocedure Evaluation (Addendum)
Anesthesia Evaluation  Patient identified by MRN, date of birth, ID band Patient awake    Reviewed: Allergy & Precautions, H&P , NPO status , Patient's Chart, lab work & pertinent test results  Airway Mallampati: III  TM Distance: >3 FB Neck ROM: Full    Dental no notable dental hx. (+) Teeth Intact, Dental Advisory Given   Pulmonary sleep apnea and Continuous Positive Airway Pressure Ventilation , former smoker,    Pulmonary exam normal breath sounds clear to auscultation       Cardiovascular Exercise Tolerance: Good hypertension, Pt. on medications  Rhythm:Regular Rate:Normal     Neuro/Psych  Headaches, Anxiety Depression    GI/Hepatic negative GI ROS, Neg liver ROS,   Endo/Other  negative endocrine ROS  Renal/GU negative Renal ROS  negative genitourinary   Musculoskeletal   Abdominal   Peds  Hematology  (+) Blood dyscrasia, Sickle cell trait and anemia ,   Anesthesia Other Findings   Reproductive/Obstetrics negative OB ROS                            Anesthesia Physical Anesthesia Plan  ASA: 3  Anesthesia Plan: General   Post-op Pain Management: Tylenol PO (pre-op)   Induction: Intravenous  PONV Risk Score and Plan: 4 or greater and Ondansetron, Dexamethasone, Midazolam and Scopolamine patch - Pre-op  Airway Management Planned: Oral ETT  Additional Equipment:   Intra-op Plan:   Post-operative Plan: Extubation in OR  Informed Consent: I have reviewed the patients History and Physical, chart, labs and discussed the procedure including the risks, benefits and alternatives for the proposed anesthesia with the patient or authorized representative who has indicated his/her understanding and acceptance.     Dental advisory given  Plan Discussed with: CRNA  Anesthesia Plan Comments:        Anesthesia Quick Evaluation

## 2021-12-13 ENCOUNTER — Other Ambulatory Visit: Payer: Self-pay

## 2021-12-13 ENCOUNTER — Inpatient Hospital Stay (HOSPITAL_COMMUNITY): Payer: Medicare Other | Admitting: Anesthesiology

## 2021-12-13 ENCOUNTER — Encounter (HOSPITAL_COMMUNITY)
Admission: RE | Disposition: A | Discharge status: Home / Self Care | Payer: Self-pay | Source: Home / Self Care | Attending: Surgery

## 2021-12-13 ENCOUNTER — Inpatient Hospital Stay (HOSPITAL_COMMUNITY)
Admission: RE | Admit: 2021-12-13 | Discharge: 2021-12-14 | DRG: 621 | Disposition: A | Discharge status: Home / Self Care | Payer: Medicare Other | Attending: Surgery | Admitting: Surgery

## 2021-12-13 ENCOUNTER — Inpatient Hospital Stay (HOSPITAL_COMMUNITY): Payer: Medicare Other | Admitting: Physician Assistant

## 2021-12-13 ENCOUNTER — Encounter (HOSPITAL_COMMUNITY): Payer: Self-pay | Admitting: Surgery

## 2021-12-13 DIAGNOSIS — Z6841 Body Mass Index (BMI) 40.0 and over, adult: Secondary | ICD-10-CM

## 2021-12-13 DIAGNOSIS — G43009 Migraine without aura, not intractable, without status migrainosus: Secondary | ICD-10-CM | POA: Diagnosis present

## 2021-12-13 DIAGNOSIS — Z79899 Other long term (current) drug therapy: Secondary | ICD-10-CM | POA: Diagnosis not present

## 2021-12-13 DIAGNOSIS — D573 Sickle-cell trait: Secondary | ICD-10-CM | POA: Diagnosis present

## 2021-12-13 DIAGNOSIS — Z01818 Encounter for other preprocedural examination: Secondary | ICD-10-CM

## 2021-12-13 DIAGNOSIS — Z791 Long term (current) use of non-steroidal anti-inflammatories (NSAID): Secondary | ICD-10-CM | POA: Diagnosis not present

## 2021-12-13 DIAGNOSIS — G4733 Obstructive sleep apnea (adult) (pediatric): Secondary | ICD-10-CM | POA: Diagnosis present

## 2021-12-13 DIAGNOSIS — Z8616 Personal history of COVID-19: Secondary | ICD-10-CM | POA: Diagnosis not present

## 2021-12-13 DIAGNOSIS — E119 Type 2 diabetes mellitus without complications: Secondary | ICD-10-CM | POA: Diagnosis present

## 2021-12-13 DIAGNOSIS — F4323 Adjustment disorder with mixed anxiety and depressed mood: Secondary | ICD-10-CM | POA: Diagnosis present

## 2021-12-13 DIAGNOSIS — G35 Multiple sclerosis: Secondary | ICD-10-CM | POA: Diagnosis present

## 2021-12-13 DIAGNOSIS — I10 Essential (primary) hypertension: Secondary | ICD-10-CM | POA: Diagnosis present

## 2021-12-13 DIAGNOSIS — F17200 Nicotine dependence, unspecified, uncomplicated: Secondary | ICD-10-CM | POA: Diagnosis present

## 2021-12-13 DIAGNOSIS — K449 Diaphragmatic hernia without obstruction or gangrene: Secondary | ICD-10-CM | POA: Diagnosis present

## 2021-12-13 DIAGNOSIS — F419 Anxiety disorder, unspecified: Secondary | ICD-10-CM | POA: Diagnosis present

## 2021-12-13 DIAGNOSIS — K219 Gastro-esophageal reflux disease without esophagitis: Secondary | ICD-10-CM | POA: Diagnosis present

## 2021-12-13 DIAGNOSIS — Z888 Allergy status to other drugs, medicaments and biological substances status: Secondary | ICD-10-CM | POA: Diagnosis not present

## 2021-12-13 DIAGNOSIS — Z9049 Acquired absence of other specified parts of digestive tract: Secondary | ICD-10-CM | POA: Diagnosis not present

## 2021-12-13 DIAGNOSIS — Z8249 Family history of ischemic heart disease and other diseases of the circulatory system: Secondary | ICD-10-CM | POA: Diagnosis not present

## 2021-12-13 DIAGNOSIS — Z7984 Long term (current) use of oral hypoglycemic drugs: Secondary | ICD-10-CM

## 2021-12-13 DIAGNOSIS — Z833 Family history of diabetes mellitus: Secondary | ICD-10-CM | POA: Diagnosis not present

## 2021-12-13 DIAGNOSIS — Z9884 Bariatric surgery status: Principal | ICD-10-CM

## 2021-12-13 HISTORY — PX: UPPER GI ENDOSCOPY: SHX6162

## 2021-12-13 HISTORY — PX: LAPAROSCOPIC GASTRIC SLEEVE RESECTION WITH HIATAL HERNIA REPAIR: SHX6512

## 2021-12-13 HISTORY — PX: HIATAL HERNIA REPAIR: SHX195

## 2021-12-13 LAB — HCG, SERUM, QUALITATIVE: Preg, Serum: NEGATIVE

## 2021-12-13 LAB — COMPREHENSIVE METABOLIC PANEL
ALT: 99 U/L — ABNORMAL HIGH (ref 0–44)
AST: 84 U/L — ABNORMAL HIGH (ref 15–41)
Albumin: 4.1 g/dL (ref 3.5–5.0)
Alkaline Phosphatase: 92 U/L (ref 38–126)
Anion gap: 8 (ref 5–15)
BUN: 14 mg/dL (ref 6–20)
CO2: 24 mmol/L (ref 22–32)
Calcium: 10.6 mg/dL — ABNORMAL HIGH (ref 8.9–10.3)
Chloride: 102 mmol/L (ref 98–111)
Creatinine, Ser: 0.63 mg/dL (ref 0.44–1.00)
GFR, Estimated: >60 mL/min (ref >60)
Glucose, Bld: 121 mg/dL — ABNORMAL HIGH (ref 70–99)
Potassium: 3.2 mmol/L — ABNORMAL LOW (ref 3.5–5.1)
Sodium: 134 mmol/L — ABNORMAL LOW (ref 135–145)
Total Bilirubin: 0.6 mg/dL (ref 0.3–1.2)
Total Protein: 7.4 g/dL (ref 6.5–8.1)

## 2021-12-13 LAB — CBC WITH DIFFERENTIAL/PLATELET
Abs Immature Granulocytes: 0.01 10*3/uL (ref 0.00–0.07)
Basophils Absolute: 0.1 10*3/uL (ref 0.0–0.1)
Basophils Relative: 1 %
Eosinophils Absolute: 0.2 10*3/uL (ref 0.0–0.5)
Eosinophils Relative: 2 %
HCT: 36.1 % (ref 36.0–46.0)
Hemoglobin: 11 g/dL — ABNORMAL LOW (ref 12.0–15.0)
Immature Granulocytes: 0 %
Lymphocytes Relative: 27 %
Lymphs Abs: 2.4 10*3/uL (ref 0.7–4.0)
MCH: 21.1 pg — ABNORMAL LOW (ref 26.0–34.0)
MCHC: 30.5 g/dL (ref 30.0–36.0)
MCV: 69.2 fL — ABNORMAL LOW (ref 80.0–100.0)
Monocytes Absolute: 0.8 10*3/uL (ref 0.1–1.0)
Monocytes Relative: 9 %
Neutro Abs: 5.4 10*3/uL (ref 1.7–7.7)
Neutrophils Relative %: 61 %
Platelets: 316 10*3/uL (ref 150–400)
RBC: 5.22 MIL/uL — ABNORMAL HIGH (ref 3.87–5.11)
RDW: 18.5 % — ABNORMAL HIGH (ref 11.5–15.5)
WBC: 8.8 10*3/uL (ref 4.0–10.5)
nRBC: 0 % (ref 0.0–0.2)

## 2021-12-13 LAB — GLUCOSE, CAPILLARY: Glucose-Capillary: 124 mg/dL — ABNORMAL HIGH (ref 70–99)

## 2021-12-13 LAB — TYPE AND SCREEN
ABO/RH(D): A POS
Antibody Screen: NEGATIVE

## 2021-12-13 LAB — ABO/RH: ABO/RH(D): A POS

## 2021-12-13 LAB — SURGICAL PATHOLOGY

## 2021-12-13 LAB — HEMOGLOBIN AND HEMATOCRIT, BLOOD
HCT: 37.1 % (ref 36.0–46.0)
Hemoglobin: 11.3 g/dL — ABNORMAL LOW (ref 12.0–15.0)

## 2021-12-13 SURGERY — XI ROBOTIC GASTRIC SLEEVE RESECTION
Anesthesia: General

## 2021-12-13 MED ORDER — MORPHINE SULFATE (PF) 2 MG/ML IV SOLN: 1.0000 mg | INTRAVENOUS | Status: DC | PRN

## 2021-12-13 MED ORDER — LIP MEDEX EX OINT
TOPICAL_OINTMENT | CUTANEOUS | Status: DC | PRN
  Filled 2021-12-13: qty 7

## 2021-12-13 MED ORDER — MIDAZOLAM HCL 2 MG/2ML IJ SOLN
INTRAMUSCULAR | Status: AC
  Filled 2021-12-13: qty 2

## 2021-12-13 MED ORDER — SUGAMMADEX SODIUM 500 MG/5ML IV SOLN
INTRAVENOUS | Status: DC | PRN
  Administered 2021-12-13: 500 mg via INTRAVENOUS

## 2021-12-13 MED ORDER — ENSURE MAX PROTEIN PO LIQD: 2.0000 [oz_av] | ORAL | Status: DC

## 2021-12-13 MED ORDER — EPHEDRINE 5 MG/ML INJ
INTRAVENOUS | Status: AC
  Filled 2021-12-13: qty 5

## 2021-12-13 MED ORDER — DEXAMETHASONE SODIUM PHOSPHATE 10 MG/ML IJ SOLN
INTRAMUSCULAR | Status: AC
  Filled 2021-12-13: qty 1

## 2021-12-13 MED ORDER — ORAL CARE MOUTH RINSE: 15.0000 mL | Freq: Once | OROMUCOSAL | Status: AC

## 2021-12-13 MED ORDER — SODIUM CHLORIDE (PF) 0.9 % IJ SOLN
INTRAMUSCULAR | Status: AC
  Filled 2021-12-13: qty 10

## 2021-12-13 MED ORDER — ONDANSETRON HCL 4 MG/2ML IJ SOLN
INTRAMUSCULAR | Status: AC
  Filled 2021-12-13: qty 2

## 2021-12-13 MED ORDER — 0.9 % SODIUM CHLORIDE (POUR BTL) OPTIME
TOPICAL | Status: DC | PRN
Start: 2021-12-13 — End: 2021-12-13
  Administered 2021-12-13: 1000 mL

## 2021-12-13 MED ORDER — OXYCODONE HCL 5 MG/5ML PO SOLN: 5.0000 mg | Freq: Four times a day (QID) | ORAL | Status: DC | PRN

## 2021-12-13 MED ORDER — ONDANSETRON HCL 4 MG/2ML IJ SOLN
4.0000 mg | INTRAMUSCULAR | Status: DC | PRN
  Administered 2021-12-13: 4 mg via INTRAVENOUS
  Filled 2021-12-13: qty 2

## 2021-12-13 MED ORDER — ROCURONIUM BROMIDE 10 MG/ML (PF) SYRINGE
PREFILLED_SYRINGE | INTRAVENOUS | Status: DC | PRN
Start: 2021-12-13 — End: 2021-12-13
  Administered 2021-12-13: 20 mg via INTRAVENOUS
  Administered 2021-12-13: 30 mg via INTRAVENOUS
  Administered 2021-12-13: 100 mg via INTRAVENOUS

## 2021-12-13 MED ORDER — SODIUM CHLORIDE 0.9 % IV SOLN
2.0000 g | INTRAVENOUS | Status: AC
  Administered 2021-12-13: 2 g via INTRAVENOUS
  Filled 2021-12-13: qty 2

## 2021-12-13 MED ORDER — HYDROMORPHONE HCL 1 MG/ML IJ SOLN
0.2500 mg | INTRAMUSCULAR | Status: DC | PRN
  Filled 2021-12-13: qty 0.5

## 2021-12-13 MED ORDER — APREPITANT 40 MG PO CAPS
40.0000 mg | ORAL_CAPSULE | ORAL | Status: AC
  Administered 2021-12-13: 40 mg via ORAL
  Filled 2021-12-13: qty 1

## 2021-12-13 MED ORDER — FENTANYL CITRATE (PF) 100 MCG/2ML IJ SOLN
INTRAMUSCULAR | Status: AC
  Filled 2021-12-13: qty 2

## 2021-12-13 MED ORDER — BUPIVACAINE LIPOSOME 1.3 % IJ SUSP: 20.0000 mL | Freq: Once | INTRAMUSCULAR | Status: DC

## 2021-12-13 MED ORDER — ACETAMINOPHEN 500 MG PO TABS
1000.0000 mg | ORAL_TABLET | Freq: Three times a day (TID) | ORAL | Status: DC
  Administered 2021-12-13 – 2021-12-14 (×4): 1000 mg via ORAL
  Filled 2021-12-13 (×4): qty 2

## 2021-12-13 MED ORDER — METOPROLOL TARTRATE 5 MG/5ML IV SOLN: 5.0000 mg | Freq: Four times a day (QID) | INTRAVENOUS | Status: DC | PRN

## 2021-12-13 MED ORDER — HEPARIN SODIUM (PORCINE) 5000 UNIT/ML IJ SOLN
5000.0000 [IU] | Freq: Three times a day (TID) | INTRAMUSCULAR | Status: DC
  Administered 2021-12-13 – 2021-12-14 (×4): 5000 [IU] via SUBCUTANEOUS
  Filled 2021-12-13 (×4): qty 1

## 2021-12-13 MED ORDER — KETAMINE HCL 50 MG/5ML IJ SOSY
PREFILLED_SYRINGE | INTRAMUSCULAR | Status: AC
  Filled 2021-12-13: qty 5

## 2021-12-13 MED ORDER — CHLORHEXIDINE GLUCONATE CLOTH 2 % EX PADS
6.0000 | MEDICATED_PAD | Freq: Once | CUTANEOUS | Status: DC
Start: 2021-12-13 — End: 2021-12-13

## 2021-12-13 MED ORDER — LACTATED RINGERS IV SOLN
INTRAVENOUS | Status: AC | PRN
  Administered 2021-12-13: 1000 mL

## 2021-12-13 MED ORDER — PROPOFOL 10 MG/ML IV BOLUS
INTRAVENOUS | Status: AC
  Filled 2021-12-13: qty 20

## 2021-12-13 MED ORDER — PHENYLEPHRINE HCL (PRESSORS) 10 MG/ML IV SOLN
INTRAVENOUS | Status: AC
  Filled 2021-12-13: qty 1

## 2021-12-13 MED ORDER — SCOPOLAMINE 1 MG/3DAYS TD PT72
1.0000 | MEDICATED_PATCH | TRANSDERMAL | Status: DC
  Administered 2021-12-13: 1.5 mg via TRANSDERMAL
  Filled 2021-12-13: qty 1

## 2021-12-13 MED ORDER — PHENYLEPHRINE HCL-NACL 20-0.9 MG/250ML-% IV SOLN
INTRAVENOUS | Status: DC | PRN
  Administered 2021-12-13: 40 ug/min via INTRAVENOUS

## 2021-12-13 MED ORDER — ROCURONIUM BROMIDE 10 MG/ML (PF) SYRINGE
PREFILLED_SYRINGE | INTRAVENOUS | Status: AC
  Filled 2021-12-13: qty 10

## 2021-12-13 MED ORDER — LACTATED RINGERS IV SOLN: INTRAVENOUS | Status: DC

## 2021-12-13 MED ORDER — CHLORHEXIDINE GLUCONATE 0.12 % MT SOLN
15.0000 mL | Freq: Once | OROMUCOSAL | Status: AC
  Administered 2021-12-13: 15 mL via OROMUCOSAL

## 2021-12-13 MED ORDER — KETAMINE HCL-SODIUM CHLORIDE 100-0.9 MG/10ML-% IV SOSY
PREFILLED_SYRINGE | INTRAVENOUS | Status: DC | PRN
  Administered 2021-12-13: 10 mg via INTRAVENOUS
  Administered 2021-12-13: 20 mg via INTRAVENOUS

## 2021-12-13 MED ORDER — BUPIVACAINE LIPOSOME 1.3 % IJ SUSP
INTRAMUSCULAR | Status: AC
  Filled 2021-12-13: qty 20

## 2021-12-13 MED ORDER — FENTANYL CITRATE (PF) 100 MCG/2ML IJ SOLN
INTRAMUSCULAR | Status: DC | PRN
  Administered 2021-12-13 (×3): 50 ug via INTRAVENOUS

## 2021-12-13 MED ORDER — PROPOFOL 10 MG/ML IV BOLUS
INTRAVENOUS | Status: DC | PRN
  Administered 2021-12-13: 50 mg via INTRAVENOUS
  Administered 2021-12-13: 200 mg via INTRAVENOUS
  Administered 2021-12-13: 50 mg via INTRAVENOUS

## 2021-12-13 MED ORDER — STERILE WATER FOR IRRIGATION IR SOLN
Status: DC | PRN
  Administered 2021-12-13: 1000 mL

## 2021-12-13 MED ORDER — MIDAZOLAM HCL 5 MG/5ML IJ SOLN
INTRAMUSCULAR | Status: DC | PRN
  Administered 2021-12-13 (×2): 1 mg via INTRAVENOUS

## 2021-12-13 MED ORDER — HEPARIN SODIUM (PORCINE) 5000 UNIT/ML IJ SOLN
5000.0000 [IU] | INTRAMUSCULAR | Status: AC
  Administered 2021-12-13: 5000 [IU] via SUBCUTANEOUS
  Filled 2021-12-13: qty 1

## 2021-12-13 MED ORDER — PANTOPRAZOLE SODIUM 40 MG IV SOLR
40.0000 mg | Freq: Every day | INTRAVENOUS | Status: DC
  Administered 2021-12-13: 40 mg via INTRAVENOUS
  Filled 2021-12-13: qty 40

## 2021-12-13 MED ORDER — ACETAMINOPHEN 500 MG PO TABS
1000.0000 mg | ORAL_TABLET | Freq: Once | ORAL | Status: DC
  Filled 2021-12-13: qty 2

## 2021-12-13 MED ORDER — BUPIVACAINE LIPOSOME 1.3 % IJ SUSP
INTRAMUSCULAR | Status: DC | PRN
  Administered 2021-12-13: 20 mL

## 2021-12-13 MED ORDER — ACETAMINOPHEN 500 MG PO TABS
1000.0000 mg | ORAL_TABLET | ORAL | Status: AC
  Administered 2021-12-13: 1000 mg via ORAL

## 2021-12-13 MED ORDER — KCL IN DEXTROSE-NACL 20-5-0.45 MEQ/L-%-% IV SOLN
INTRAVENOUS | Status: DC
  Filled 2021-12-13 (×3): qty 1000

## 2021-12-13 MED ORDER — EPHEDRINE SULFATE-NACL 50-0.9 MG/10ML-% IV SOSY
PREFILLED_SYRINGE | INTRAVENOUS | Status: DC | PRN
  Administered 2021-12-13 (×2): 10 mg via INTRAVENOUS

## 2021-12-13 MED ORDER — CHLORHEXIDINE GLUCONATE CLOTH 2 % EX PADS: 6.0000 | MEDICATED_PAD | Freq: Once | CUTANEOUS | Status: DC

## 2021-12-13 MED ORDER — SODIUM CHLORIDE (PF) 0.9 % IJ SOLN
INTRAMUSCULAR | Status: DC | PRN
  Administered 2021-12-13: 10 mL

## 2021-12-13 MED ORDER — ONDANSETRON HCL 4 MG/2ML IJ SOLN
INTRAMUSCULAR | Status: DC | PRN
  Administered 2021-12-13 (×2): 4 mg via INTRAVENOUS

## 2021-12-13 MED ORDER — DEXAMETHASONE SODIUM PHOSPHATE 10 MG/ML IJ SOLN
INTRAMUSCULAR | Status: DC | PRN
  Administered 2021-12-13: 10 mg via INTRAVENOUS

## 2021-12-13 MED ORDER — SUGAMMADEX SODIUM 500 MG/5ML IV SOLN
INTRAVENOUS | Status: AC
  Filled 2021-12-13: qty 5

## 2021-12-13 MED ORDER — LIDOCAINE 2% (20 MG/ML) 5 ML SYRINGE
INTRAMUSCULAR | Status: DC | PRN
Start: 2021-12-13 — End: 2021-12-13
  Administered 2021-12-13: 60 mg via INTRAVENOUS

## 2021-12-13 MED ORDER — ACETAMINOPHEN 160 MG/5ML PO SOLN: 1000.0000 mg | Freq: Three times a day (TID) | ORAL | Status: DC

## 2021-12-13 MED ORDER — LIDOCAINE HCL (PF) 2 % IJ SOLN
INTRAMUSCULAR | Status: AC
  Filled 2021-12-13: qty 5

## 2021-12-13 SURGICAL SUPPLY — 72 items
ADH SKN CLS APL DERMABOND .7 (GAUZE/BANDAGES/DRESSINGS) ×1
APL PRP STRL LF DISP 70% ISPRP (MISCELLANEOUS) ×1
APPLIER CLIP 5 13 M/L LIGAMAX5 (MISCELLANEOUS)
APPLIER CLIP ROT 10 11.4 M/L (STAPLE)
APR CLP MED LRG 11.4X10 (STAPLE)
APR CLP MED LRG 5 ANG JAW (MISCELLANEOUS)
BLADE SURG 15 STRL LF DISP TIS (BLADE) ×1 IMPLANT
BLADE SURG 15 STRL SS (BLADE) ×2
CANNULA REDUC XI 12-8 STAPL (CANNULA) ×2
CANNULA REDUCER 12-8 DVNC XI (CANNULA) ×1 IMPLANT
CHLORAPREP W/TINT 26 (MISCELLANEOUS) ×2 IMPLANT
CLIP APPLIE 5 13 M/L LIGAMAX5 (MISCELLANEOUS) IMPLANT
CLIP APPLIE ROT 10 11.4 M/L (STAPLE) IMPLANT
COVER SURGICAL LIGHT HANDLE (MISCELLANEOUS) ×2 IMPLANT
DECANTER SPIKE VIAL GLASS SM (MISCELLANEOUS) ×2 IMPLANT
DERMABOND ADVANCED (GAUZE/BANDAGES/DRESSINGS) ×1
DERMABOND ADVANCED .7 DNX12 (GAUZE/BANDAGES/DRESSINGS) ×1 IMPLANT
DRAPE ARM DVNC X/XI (DISPOSABLE) ×4 IMPLANT
DRAPE COLUMN DVNC XI (DISPOSABLE) ×1 IMPLANT
DRAPE DA VINCI XI ARM (DISPOSABLE) ×8
DRAPE DA VINCI XI COLUMN (DISPOSABLE) ×2
ELECT REM PT RETURN 15FT ADLT (MISCELLANEOUS) ×2 IMPLANT
GLOVE SURG ENC MOIS LTX SZ8 (GLOVE) ×4 IMPLANT
GOWN STRL REUS W/TWL XL LVL3 (GOWN DISPOSABLE) ×6 IMPLANT
GRASPER SUT TROCAR 14GX15 (MISCELLANEOUS) ×2 IMPLANT
IRRIG SUCT STRYKERFLOW 2 WTIP (MISCELLANEOUS) ×2
IRRIGATION SUCT STRKRFLW 2 WTP (MISCELLANEOUS) ×1 IMPLANT
KIT BASIN OR (CUSTOM PROCEDURE TRAY) ×2 IMPLANT
KIT TURNOVER KIT A (KITS) IMPLANT
LUBRICANT JELLY K Y 4OZ (MISCELLANEOUS) IMPLANT
MARKER SKIN DUAL TIP RULER LAB (MISCELLANEOUS) ×2 IMPLANT
MAT PREVALON FULL STRYKER (MISCELLANEOUS) ×2 IMPLANT
NDL SPNL 22GX3.5 QUINCKE BK (NEEDLE) ×1 IMPLANT
NEEDLE SPNL 22GX3.5 QUINCKE BK (NEEDLE) ×2 IMPLANT
OBTURATOR OPTICAL STANDARD 8MM (TROCAR) ×2
OBTURATOR OPTICAL STND 8 DVNC (TROCAR) ×1
OBTURATOR OPTICALSTD 8 DVNC (TROCAR) ×1 IMPLANT
PACK CARDIOVASCULAR III (CUSTOM PROCEDURE TRAY) ×2 IMPLANT
RELOAD STAPLE 60 2.5 WHT DVNC (STAPLE) IMPLANT
RELOAD STAPLE 60 3.5 BLU DVNC (STAPLE) IMPLANT
RELOAD STAPLER 2.5X60 WHT DVNC (STAPLE) ×6 IMPLANT
RELOAD STAPLER 3.5X60 BLU DVNC (STAPLE) ×1 IMPLANT
SCISSORS LAP 5X35 DISP (ENDOMECHANICALS) IMPLANT
SCISSORS LAP 5X45 EPIX DISP (ENDOMECHANICALS) ×1 IMPLANT
SEAL CANN UNIV 5-8 DVNC XI (MISCELLANEOUS) ×3 IMPLANT
SEAL XI 5MM-8MM UNIVERSAL (MISCELLANEOUS) ×6
SEALER VESSEL DA VINCI XI (MISCELLANEOUS) ×2
SEALER VESSEL EXT DVNC XI (MISCELLANEOUS) ×1 IMPLANT
SLEEVE GASTRECTOMY 36FR VISIGI (MISCELLANEOUS) ×2 IMPLANT
SOL ANTI FOG 6CC (MISCELLANEOUS) ×1 IMPLANT
SOLUTION ANTI FOG 6CC (MISCELLANEOUS) ×1
SOLUTION ELECTROLUBE (MISCELLANEOUS) ×2 IMPLANT
SPONGE T-LAP 18X18 ~~LOC~~+RFID (SPONGE) ×2 IMPLANT
STAPLER 60 DA VINCI SURE FORM (STAPLE) ×2
STAPLER 60 SUREFORM DVNC (STAPLE) ×1 IMPLANT
STAPLER CANNULA SEAL DVNC XI (STAPLE) ×1 IMPLANT
STAPLER CANNULA SEAL XI (STAPLE) ×2
STAPLER RELOAD 2.5X60 WHITE (STAPLE) ×12
STAPLER RELOAD 2.5X60 WHT DVNC (STAPLE) ×6
STAPLER RELOAD 3.5X60 BLU DVNC (STAPLE) ×1
STAPLER RELOAD 3.5X60 BLUE (STAPLE) ×2
SUT ETHIBOND 0 36 GRN (SUTURE) ×1 IMPLANT
SUT MNCRL AB 4-0 PS2 18 (SUTURE) ×4 IMPLANT
SUT VICRYL 0 TIES 12 18 (SUTURE) ×2 IMPLANT
SYR 10ML ECCENTRIC (SYRINGE) IMPLANT
SYR 20ML LL LF (SYRINGE) ×2 IMPLANT
TOWEL OR 17X26 10 PK STRL BLUE (TOWEL DISPOSABLE) ×2 IMPLANT
TRAY FOLEY MTR SLVR 16FR STAT (SET/KITS/TRAYS/PACK) IMPLANT
TROCAR ADV FIXATION 5X100MM (TROCAR) IMPLANT
TROCAR BLADELESS OPT 5 100 (ENDOMECHANICALS) ×2 IMPLANT
TUBE CALIBRATION LAPBAND (TUBING) IMPLANT
TUBING INSUFFLATION 10FT LAP (TUBING) ×2 IMPLANT

## 2021-12-13 NOTE — Progress Notes (Signed)

## 2021-12-13 NOTE — Op Note (Signed)
° °  Surgeon: Kaylyn Lim, MD, FACS  Asst:  Romana Juniper, MD, FACS 13 December 2021 Anes:  General endotracheal  Procedure: Robotic hiatal hernia repair, sleeve gastrectomy and upper endoscopy  Diagnosis: Morbid obesity and hiatal hernia  Complications:   None noted  EBL:   minimal cc  Description of Procedure:  The patient was take to OR  and given general anesthesia.  The abdomen was prepped with Chloroprep and draped sterilely.  A timeout was performed.  Access to the abdomen was achieved with a 5 mm Optiview through the upper mid abdomen at a site that would be the camera port.  Following insufflation, the state of the abdomen was found to be with some adhesions in the right upper quadrant and lower abdomen.  The ViSiGi 36Fr tube was inserted to deflate the stomach and was pulled back into the esophagus.  Four trocars were placed including a 12 mm for the robotic stapler.    The pylorus was identified and we measured ~5  cm back and marked the antrum.  At that point we began dissection to take down the greater curvature of the stomach using the vessel sealer.  This dissection was taken all the way up to the left crus.  Posterior attachments of the stomach were also taken down.  The upper GI had shown a small hiatal hernia and there was a dimple noted.  A posterior dissection was then done on the right side.  The right and left crura were identified and a single suture was placed through both crurae.  The was a 0 Surgidek secured with multiple square throws.  The ViSiGi tube was then passed into the antrum and suction applied so that it was snug along the lessor curvature.  The "crow's foot" or incisura was identified.  The sleeve gastrectomy was begun using the Sureform platform stapler beginning with a blue load and following with multiple firings of the white load until the sleeve as complete.  When the sleeve was complete the tube was taken off suction and insufflated briefly.  The tube  was withdrawn.  Upper endoscopy was then performed by Dr. Hassell Done and a EG junction looked good, the sleeve was cylindrical, and no bleeding or bubbles were noted. .     The specimen was extracted through the 15 trocar site.  This port site was closed with a 0 vicryl using the PMI.  Local block was provided by infiltrating abdomen as a TAP block of Exparel and then closed 4-0 Monocryl and Dermabond.    Matt B. Hassell Done, [place], [facility], [place]

## 2021-12-13 NOTE — Progress Notes (Signed)
Pt cpap set up, will place on self when ready.

## 2021-12-13 NOTE — Progress Notes (Signed)
Patient ambulated to recliner from surgery stretcher and to the restroom. Patient asks to rest for a bit before beginning her water. She complains of "gas pain". Denies the need for nausea or pain prn. Will attempt intake and IS use when more alert.

## 2021-12-13 NOTE — Anesthesia Procedure Notes (Signed)
Procedure Name: Intubation Date/Time: 12/13/2021 8:00 AM Performed by: Lavina Hamman, CRNA Pre-anesthesia Checklist: Patient identified, Emergency Drugs available, Suction available, Patient being monitored and Timeout performed Patient Re-evaluated:Patient Re-evaluated prior to induction Oxygen Delivery Method: Circle system utilized Preoxygenation: Pre-oxygenation with 100% oxygen Induction Type: IV induction Ventilation: Mask ventilation without difficulty Laryngoscope Size: Mac and 3 Grade View: Grade I Tube type: Oral Tube size: 7.0 mm Number of attempts: 1 Airway Equipment and Method: Stylet Placement Confirmation: ETT inserted through vocal cords under direct vision, positive ETCO2, CO2 detector and breath sounds checked- equal and bilateral Secured at: 22 cm Tube secured with: Tape Dental Injury: Teeth and Oropharynx as per pre-operative assessment  Comments: ATOI, easy mask with back on bed up 20 deg.

## 2021-12-13 NOTE — Progress Notes (Signed)
Patient started water @ 1314 with medication. Patient also demonstrated IS use. Patient is still sleepy and wishes to rest.

## 2021-12-13 NOTE — Anesthesia Postprocedure Evaluation (Signed)
Anesthesia Post Note  Patient: DONNAMAE MUILENBURG  Procedure(s) Performed: XI ROBOTIC GASTRIC SLEEVE RESECTION UPPER GI ENDOSCOPY HERNIA REPAIR HIATAL     Patient location during evaluation: PACU Anesthesia Type: General Level of consciousness: awake and alert Pain management: pain level controlled Vital Signs Assessment: post-procedure vital signs reviewed and stable Respiratory status: spontaneous breathing, nonlabored ventilation, respiratory function stable and patient connected to nasal cannula oxygen Cardiovascular status: blood pressure returned to baseline and stable Postop Assessment: no apparent nausea or vomiting Anesthetic complications: no   No notable events documented.  Last Vitals:  Vitals:   12/13/21 1100 12/13/21 1115  BP: (!) 141/83 (!) 138/93  Pulse: 85 86  Resp: 18 18  Temp:    SpO2: 97% 98%    Last Pain:  Vitals:   12/13/21 1115  TempSrc:   PainSc: 2                  Heydy Montilla,W. EDMOND

## 2021-12-13 NOTE — Transfer of Care (Signed)
Immediate Anesthesia Transfer of Care Note  Patient: [name]  Procedure(s) Performed: Procedure(s): XI ROBOTIC GASTRIC SLEEVE RESECTION (N/A) UPPER GI ENDOSCOPY (N/A) HERNIA REPAIR HIATAL (N/A)  Patient Location: PACU  Anesthesia Type:General  Level of Consciousness:  sedated, patient cooperative and responds to stimulation  Airway & Oxygen Therapy:Patient Spontanous Breathing and Patient connected to face mask oxgen  Post-op Assessment:  Report given to PACU RN and Post -op Vital signs reviewed and stable  Post vital signs:  Reviewed and stable  Last Vitals:  Vitals:   12/13/21 0608  BP: 123/72  Pulse: 99  Resp: 16  Temp: 37.1 C  SpO2: 95%    Complications: No apparent anesthesia complications

## 2021-12-13 NOTE — Progress Notes (Signed)
PHARMACY CONSULT FOR:  Risk Assessment for Post-Discharge VTE Following Bariatric Surgery  Post-Discharge VTE Risk Assessment: This patient's probability of 30-day post-discharge VTE is increased due to the factors marked:  X Sleeve gastrectomy   Liver disorder (transplant, cirrhosis, or nonalcoholic steatohepatitis)   Hx of VTE   Hemorrhage requiring transfusion   GI perforation, leak, or obstruction       Female    Age >/=60 years  X  BMI >/=50 kg/m2    CHF    Dyspnea at Rest    Paraplegia   X Non-gastric-band surgery    Operation Time >/=3 hr    Return to OR     Length of Stay >/= 3 d   Hypercoagulable condition   Significant venous stasis    Predicted probability of 30-day post-discharge VTE: 0.27%  Other patient-specific factors to consider: N/A  Recommendation for Discharge: No pharmacologic prophylaxis post-discharge   [name] is a 51 y.o. female who underwent robotic gastric sleeve resection, upper GI endoscopy, and hiatal hernia repair on 12/13/21   Case start: 0807 Case end: 1019   Allergies  Allergen Reactions   Iron Shortness Of Breath    IV only    Tysabri [Natalizumab] Other (See Comments)    Shortness of breath, joint pain, tremors   Delsym [Dextromethorphan Polistirex Er] Other (See Comments)    nightmares    Patient Measurements: Height: 5\' 4"  (162.6 cm) Weight: 133.8 kg (295 lb) IBW/kg (Calculated) : 54.7 Body mass index is 50.64 kg/m.  Recent Labs    12/13/21 0559  WBC 8.8  HGB 11.0*  HCT 36.1  PLT 316  CREATININE 0.63  ALBUMIN 4.1  PROT 7.4  AST 84*  ALT 99*  ALKPHOS 92  BILITOT 0.6   Estimated Creatinine Clearance: 112.1 mL/min (by C-G formula based on SCr of 0.63 mg/dL).    Past Medical History:  Diagnosis Date   Allergy    Anemia 2008   Anxiety    Depression    Hypertension    Migraine    MS (multiple sclerosis) (HCC)    OSA on CPAP    C-PAP   Pre-diabetes    Sickle cell trait (HCC)      Medications  Prior to Admission  Medication Sig Dispense Refill Last Dose   acetaminophen (TYLENOL) 325 MG tablet Take 325 mg by mouth every 6 (six) hours as needed for moderate pain.   12/12/2021   amLODipine (NORVASC) 5 MG tablet Take one tablet (5 mg dose) by mouth daily. 90 tablet 3 12/13/2021 at 0340   atorvastatin (LIPITOR) 10 MG tablet Take one tablet (10 mg dose) by mouth daily. 30 tablet 5 12/12/2021   cholecalciferol (VITAMIN D3) 25 MCG (1000 UNIT) tablet Take 1,000 Units by mouth daily.   12/12/2021   ibuprofen (ADVIL) 200 MG tablet Take 200 mg by mouth every 8 (eight) hours as needed for moderate pain.   Past Month   telmisartan-hydrochlorothiazide (MICARDIS HCT) 80-25 MG tablet Take one tablet by mouth daily. 90 tablet 3 12/12/2021   Accu-Chek FastClix Lancets MISC Use to check blood sugar 2 times a day 102 each PRN supply   Blood Glucose Monitoring Suppl (ACCU-CHEK GUIDE) w/Device KIT Use to test blood sugar 2 times a day 1 kit 99 supply   glucose blood test strip Use to check blood sugar 2 times a day 100 each PRN supply   HYDROcodone-acetaminophen (NORCO) 7.5-325 MG tablet Take one tablet by mouth every 6 (six) hours as  needed for pain. 30 tablet 0 More than a month   HYDROcodone-acetaminophen (NORCO/VICODIN) 5-325 MG tablet Take one tablet by mouth every 6 (six) hours as needed for Pain. Do not use with Ambien (zolpidem) (Patient not taking: Reported on 11/26/2021) 30 tablet 0 Not Taking   Ocrelizumab (OCREVUS IV) Inject into the vein See admin instructions. Every 6 months   More than a month   valACYclovir (VALTREX) 1000 MG tablet Take one tablet by mouth 2 times daily. Start with first symptom of outbreak. (Patient not taking: Reported on 11/26/2021) 14 tablet 0 Not Taking   varenicline (CHANTIX) 1 MG tablet Take 1/2 tablet by mouth twice daily for 7 days, then 1 tablet twice daily thereafter (Patient not taking: Reported on 11/26/2021) 60 tablet 5 Not Taking   zolpidem (AMBIEN) 10 MG tablet Take one  tablet (10 mg dose) by mouth at bedtime as needed for sleep for up to 30 days. (Patient not taking: Reported on 11/26/2021) 30 tablet 0 Not Taking    Dimple Nanas, PharmD 12/13/2021 12:19 PM

## 2021-12-13 NOTE — Addendum Note (Signed)
Addendum  created 12/13/21 1145 by Roderic Palau, MD   Order list changed, Pharmacy for encounter modified

## 2021-12-13 NOTE — Interval H&P Note (Signed)
History and Physical Interval Note:  12/13/2021 7:19 AM  [name]  has presented today for surgery, with the diagnosis of morbid obesity.  The various methods of treatment have been discussed with the patient and family. After consideration of risks, benefits and other options for treatment, the patient has consented to  Procedure(s): XI ROBOTIC GASTRIC SLEEVE RESECTION (N/A) UPPER GI ENDOSCOPY (N/A) HERNIA REPAIR HIATAL (N/A) as a surgical intervention.  The patient's history has been reviewed, patient examined, no change in status, stable for surgery.  I have reviewed the patient's chart and labs.  Questions were answered to the patient's satisfaction.     Pedro Earls

## 2021-12-14 ENCOUNTER — Encounter (HOSPITAL_COMMUNITY): Payer: Self-pay | Admitting: Surgery

## 2021-12-14 ENCOUNTER — Other Ambulatory Visit (HOSPITAL_COMMUNITY): Payer: Self-pay

## 2021-12-14 LAB — CBC WITH DIFFERENTIAL/PLATELET
Abs Immature Granulocytes: 0.16 10*3/uL — ABNORMAL HIGH (ref 0.00–0.07)
Basophils Absolute: 0 10*3/uL (ref 0.0–0.1)
Basophils Relative: 0 %
Eosinophils Absolute: 0 10*3/uL (ref 0.0–0.5)
Eosinophils Relative: 0 %
HCT: 34.5 % — ABNORMAL LOW (ref 36.0–46.0)
Hemoglobin: 10.4 g/dL — ABNORMAL LOW (ref 12.0–15.0)
Immature Granulocytes: 1 %
Lymphocytes Relative: 12 %
Lymphs Abs: 1.8 10*3/uL (ref 0.7–4.0)
MCH: 21.4 pg — ABNORMAL LOW (ref 26.0–34.0)
MCHC: 30.1 g/dL (ref 30.0–36.0)
MCV: 70.8 fL — ABNORMAL LOW (ref 80.0–100.0)
Monocytes Absolute: 1.1 10*3/uL — ABNORMAL HIGH (ref 0.1–1.0)
Monocytes Relative: 7 %
Neutro Abs: 12.1 10*3/uL — ABNORMAL HIGH (ref 1.7–7.7)
Neutrophils Relative %: 80 %
Platelets: 299 10*3/uL (ref 150–400)
RBC: 4.87 MIL/uL (ref 3.87–5.11)
RDW: 18.8 % — ABNORMAL HIGH (ref 11.5–15.5)
WBC: 15.2 10*3/uL — ABNORMAL HIGH (ref 4.0–10.5)
nRBC: 0 % (ref 0.0–0.2)

## 2021-12-14 MED ORDER — PANTOPRAZOLE SODIUM 40 MG PO TBEC
40.0000 mg | DELAYED_RELEASE_TABLET | Freq: Every day | ORAL | 0 refills | Status: DC
  Filled 2021-12-14: qty 90, 90d supply, fill #0

## 2021-12-14 MED ORDER — OXYCODONE HCL 5 MG PO TABS
5.0000 mg | ORAL_TABLET | Freq: Four times a day (QID) | ORAL | 0 refills | Status: DC | PRN
  Filled 2021-12-14: qty 10, 3d supply, fill #0

## 2021-12-14 MED ORDER — ONDANSETRON 4 MG PO TBDP
4.0000 mg | ORAL_TABLET | Freq: Four times a day (QID) | ORAL | 0 refills | Status: DC | PRN
  Filled 2021-12-14: qty 20, 5d supply, fill #0

## 2021-12-14 NOTE — Discharge Instructions (Signed)

## 2021-12-14 NOTE — Progress Notes (Signed)
Patient alert and oriented, pain is controlled. Patient is tolerating fluids, advanced to protein shake today, patient is tolerating well.  Reviewed Gastric sleeve discharge instructions with patient and patient is able to articulate understanding.  Provided information on BELT program, Support Group and [facility] outpatient pharmacy. All questions answered, will continue to monitor.  

## 2021-12-14 NOTE — Discharge Summary (Signed)
Physician Discharge Summary  Patient ID: [name]: [id number] DOB/AGE: [birth year] 51 y.o.  PCP: Jodi Mons, PA  Admit date: 12/13/2021 Discharge date: 12/14/2021  Admission Diagnoses:  morbid obesity  Discharge Diagnoses:  same  Principal Problem:   S/P robotic sleeve gastrectomy and posterior hiatal hernia repair Active Problems:   Status post laparoscopic sleeve gastrectomy   Surgery:  Xi robotic sleeve with hiatal hernia repair  Discharged Condition: improved  Hospital Course:   had robotic sleeve gastrectomy and hiatal hernia repair on Monday.  Ready for discharge after taking protein liquids on Tuesday.    Consults: none  Significant Diagnostic Studies: none    Discharge Exam: Blood pressure (!) 142/81, pulse 87, temperature 97.9 F (36.6 C), temperature source Oral, resp. rate 18, height $RemoveBe'5\' 4"'StxGeZwun$  (1.626 m), weight 133.8 kg, last menstrual period 10/22/2021, SpO2 99 %. Incisions OK  Disposition: Discharge disposition: 01-Home or Self Care       Discharge Instructions     Ambulate hourly while awake   Complete by: As directed    Call MD for:  difficulty breathing, headache or visual disturbances   Complete by: As directed    Call MD for:  persistant dizziness or light-headedness   Complete by: As directed    Call MD for:  persistant nausea and vomiting   Complete by: As directed    Call MD for:  redness, tenderness, or signs of infection (pain, swelling, redness, odor or green/yellow discharge around incision site)   Complete by: As directed    Call MD for:  severe uncontrolled pain   Complete by: As directed    Call MD for:  temperature >101 F   Complete by: As directed    Diet bariatric full liquid   Complete by: As directed    Incentive spirometry   Complete by: As directed    Perform hourly while awake      Allergies as of 12/14/2021       Reactions   Iron Shortness Of Breath   IV only    Tysabri [natalizumab] Other (See  Comments)   Shortness of breath, joint pain, tremors   Delsym [dextromethorphan Polistirex Er] Other (See Comments)   nightmares        Medication List     STOP taking these medications    acetaminophen 325 MG tablet Commonly known as: TYLENOL   HYDROcodone-acetaminophen 5-325 MG tablet Commonly known as: NORCO/VICODIN   HYDROcodone-acetaminophen 7.5-325 MG tablet Commonly known as: NORCO   ibuprofen 200 MG tablet Commonly known as: ADVIL   varenicline 1 MG tablet Commonly known as: CHANTIX   zolpidem 10 MG tablet Commonly known as: AMBIEN       TAKE these medications    Accu-Chek FastClix Lancets Misc Use to check blood sugar 2 times a day   Accu-Chek Guide test strip Generic drug: glucose blood Use to check blood sugar 2 times a day   Accu-Chek Guide w/Device Kit Use to test blood sugar 2 times a day   amLODipine 5 MG tablet Commonly known as: NORVASC Take one tablet (5 mg dose) by mouth daily. Notes to patient: Monitor Blood Pressure Daily and keep a log for primary care physician.  You may need to make changes to your medications with rapid weight loss.     atorvastatin 10 MG tablet Commonly known as: LIPITOR Take one tablet (10 mg dose) by mouth daily.   cholecalciferol 25 MCG (1000 UNIT) tablet Commonly known as: VITAMIN D3 Take  1,000 Units by mouth daily.   OCREVUS IV Inject into the vein See admin instructions. Every 6 months   ondansetron 4 MG disintegrating tablet Commonly known as: ZOFRAN-ODT Take 1 tablet (4 mg total) by mouth every 6 (six) hours as needed for nausea or vomiting.   oxyCODONE 5 MG immediate release tablet Commonly known as: Oxy IR/ROXICODONE Take 1 tablet (5 mg total) by mouth every 6 (six) hours as needed for severe pain.   pantoprazole 40 MG tablet Commonly known as: PROTONIX Take 1 tablet (40 mg total) by mouth daily.   telmisartan-hydrochlorothiazide 80-25 MG tablet Commonly known as: MICARDIS HCT Take one  tablet by mouth daily. Notes to patient: Monitor Blood Pressure Daily and keep a log for primary care physician.  You may need to make changes to your medications with rapid weight loss.     valACYclovir 1000 MG tablet Commonly known as: VALTREX Take one tablet by mouth 2 times daily. Start with first symptom of outbreak.        Follow-up Information     Surgery, [place]. Go on 12/29/2021.   Specialty: General Surgery Why: at 9am with Dr. Hassell [name].  Please arrive 15 minutes prior to your appointment time.  Thank you. Contact information: [address] [place] [address] [phone]         Jodi Hausen, MD. Go on 01/26/2022.   Specialty: General Surgery Why: at [facility] [place].  Please arrive 15 minutes prior to your appointment time.  Thank you. Contact information: [address] [place] [address] [phone]                 Signed: [name] 12/14/2021, 2:39 PM

## 2021-12-15 ENCOUNTER — Ambulatory Visit: Payer: Medicare Other | Admitting: Diagnostic Neuroimaging

## 2021-12-15 NOTE — Progress Notes (Signed)
24hr fluid recall prior to discharge: 671mL.  Per dehydration protocol, will call pt to f/u within one week post op.

## 2021-12-17 ENCOUNTER — Telehealth (HOSPITAL_COMMUNITY): Payer: Self-pay | Admitting: *Deleted

## 2021-12-17 NOTE — Telephone Encounter (Signed)
1.  Tell me about your pain and pain management? Pt denies any pain.  2.  Let's talk about fluid intake.  How much total fluid are you taking in? Pt states that she is working to meet goal of 64 oz of fluid today.  Pt has been able to consume approx. 45-50oz of fluid per day since surgery.  Pt plans to increase clear liquids and protein to meet fluid goals.  Pt instructed to assess status and suggestions daily utilizing Hydration Action Plan on discharge folder and to call [facility] if in the "red zone".   3.  How much protein have you taken in the last 2 days? Pt states that she is working to meet goal of goal of 60g of protein today.  Pt has already consumed 1 protein shake.  Pt plans to drink remainder of protein throughout the rest of the day to meet goal.  4.  Have you had nausea?  Tell me about when have experienced nausea and what you did to help? Pt denies nausea.   5.  Has the frequency or color changed with your urine? Pt states that she is urinating "fine" with no changes in frequency or urgency.     6.  Tell me what your incisions look like? "Incisions look fine". Pt denies a fever, chills.  Pt states incisions are not swollen, open, or draining.  Pt encouraged to call [facility] if incisions change.   7.  Have you been passing gas? BM? Pt states that she is having BMs. Last BM 12/16/21.      8.  If a problem or question were to arise who would you call?  Do you know contact numbers for [facility], [facility], and [facility]? Pt denies dehydration symptoms.  Pt can describe s/sx of dehydration.  Pt knows to call [facility] for surgical, [facility] for nutrition, and [facility] for non-urgent questions or concerns.   9.  How has the walking going? Pt states she is walking around and able to be active without difficulty.   10. Are you still using your incentive spirometer?  If so, how often? Pt states that she is using the I.S. 3x/day. Pt encouraged to use incentive spirometer, at least 10x every hour while awake until she sees the  surgeon.  11.  How are your vitamins and calcium going?  How are you taking them? Pt states that she will begin taking the supplements tomorrow.  Reinforced education about taking supplements no closer than 2 hours apart.   Reminded patient that the first 30 days post-operatively are important for successful recovery.  Practice good hand hygiene, wearing a mask when appropriate (since optional in most places), and minimizing exposure to people who live outside of the home, especially if they are exhibiting any respiratory, GI, or illness-like symptoms.

## 2021-12-28 ENCOUNTER — Other Ambulatory Visit: Payer: Self-pay

## 2021-12-28 ENCOUNTER — Encounter: Payer: Medicare Other | Attending: Surgery | Admitting: Skilled Nursing Facility1

## 2021-12-28 DIAGNOSIS — Z6841 Body Mass Index (BMI) 40.0 and over, adult: Secondary | ICD-10-CM | POA: Insufficient documentation

## 2021-12-28 DIAGNOSIS — Z713 Dietary counseling and surveillance: Secondary | ICD-10-CM | POA: Insufficient documentation

## 2021-12-28 DIAGNOSIS — R7303 Prediabetes: Secondary | ICD-10-CM | POA: Insufficient documentation

## 2021-12-28 DIAGNOSIS — Z9884 Bariatric surgery status: Secondary | ICD-10-CM | POA: Diagnosis not present

## 2021-12-28 DIAGNOSIS — I10 Essential (primary) hypertension: Secondary | ICD-10-CM | POA: Diagnosis not present

## 2021-12-29 NOTE — Progress Notes (Signed)
2 Week Post-Operative Nutrition Class   Patient was seen on 12/29/2021 for Post-Operative Nutrition education at [facility].    Surgery date: 12/13/2021 Surgery type: sleeve Start weight at [facility]: 307 pounds Bowel Habits: Every day to every other day no complaints   Body Composition Scale 12/29/2021  Current Body Weight 280.2  Total Body Fat % 48.7  Visceral Fat 21  Fat-Free Mass % 51.2   Total Body Water % 40.1  Muscle-Mass lbs 30.9  BMI 48.6  Body Fat Displacement          Torso  lbs 84.8         Left Leg  lbs 16.9         Right Leg  lbs 16.9         Left Arm  lbs 8.4         Right Arm   lbs 8.4      The following the learning objectives were met by the patient during this course: Identifies Phase 3 (Soft, High Proteins) Dietary Goals and will begin from 2 weeks post-operatively to 2 months post-operatively Identifies appropriate sources of fluids and proteins  Identifies appropriate fat sources and healthy verses unhealthy fat types   States protein recommendations and appropriate sources post-operatively Identifies the need for appropriate texture modifications, mastication, and bite sizes when consuming solids Identifies appropriate fat consumption and sources Identifies appropriate multivitamin and calcium sources post-operatively Describes the need for physical activity post-operatively and will follow MD recommendations States when to call healthcare provider regarding medication questions or post-operative complications   Handouts given during class include: Phase 3A: Soft, High Protein Diet Handout Phase 3 High Protein Meals Healthy Fats   Follow-Up Plan: Patient will follow-up at [facility] in 6 weeks for 2 month post-op nutrition visit for diet advancement per MD.

## 2022-01-03 ENCOUNTER — Telehealth: Payer: Self-pay | Admitting: Skilled Nursing Facility1

## 2022-01-03 NOTE — Telephone Encounter (Signed)
RD called pt to verify fluid intake once starting soft, solid proteins 2 week post-bariatric surgery.   Daily Fluid intake:  Daily Protein intake: Bowel Habits:   Concerns/issues:    LVM 

## 2022-01-04 ENCOUNTER — Telehealth: Payer: Self-pay | Admitting: Skilled Nursing Facility1

## 2022-01-04 NOTE — Telephone Encounter (Signed)
Returned pts call.   Pt states everything is going fine with no issues to report.   Pt states she is going to try edamame.

## 2022-01-10 ENCOUNTER — Encounter: Payer: Self-pay | Admitting: Diagnostic Neuroimaging

## 2022-01-10 ENCOUNTER — Ambulatory Visit: Payer: Medicare Other | Admitting: Diagnostic Neuroimaging

## 2022-01-10 VITALS — BP 92/57 | HR 104 | Ht 63.0 in | Wt 275.0 lb

## 2022-01-10 DIAGNOSIS — G35 Multiple sclerosis: Secondary | ICD-10-CM | POA: Diagnosis not present

## 2022-01-10 DIAGNOSIS — Z79899 Other long term (current) drug therapy: Secondary | ICD-10-CM

## 2022-01-10 NOTE — Progress Notes (Signed)
Chief Complaint  Patient presents with   Multiple Sclerosis    Rm 7, 6 month FU "doing well since last visit"    History of Present Illness:  UPDATE (01/10/22, VRP): Since last visit, doing well. Had gastric sleeve surg in 12/13/21, and already lost 30lbs. Symptoms are stable overall. Some dizziness with standing.   UPDATE (06/14/21, VRP): Since last visit, doing well. Symptoms are stable overall, except right leg now hurting more. Left leg pain stable. Tolerating ocrevus.     UPDATE (11/10/20, VRP): Since last visit, doing about the same. Symptoms are stable. Severity is moderate. No alleviating or aggravating factors. Tolerating meds. Had some positional dizziness, but improved with hydration.  UPDATE (04/28/20, VRP): Since last visit, had to leave work due to long term disability expiring. Applying for medicaid. MS symptoms stable. HA stable. However, back and left leg pain are worsened. Not able to work because of low back pain and left leg pain, cannot sit for prolonged time and severe pain. Not driving right now. More depression.  PRIOR HPI:  - MS sxs are stable; tolerating ocrevus - left sciatica pain is stable; continues to struggle with pain mgmt; has tried [facility] without relief; now seeing chiropractor and doing better - mood, nutrition, exercise are improving - has been out of work since Feb 2020    Observations/Objective:  GENERAL EXAM/CONSTITUTIONAL: Vitals:  Vitals:   01/10/22 1430  BP: (!) 92/57  Pulse: (!) 104  Weight: 275 lb (124.7 kg)  Height: 5\' 3"  (1.6 m)   Body mass index is 48.71 kg/m. Wt Readings from Last 3 Encounters:  01/10/22 275 lb (124.7 kg)  12/29/21 280 lb 3.2 oz (127.1 kg)  12/13/21 295 lb (133.8 kg)   Patient is in no distress; well developed, nourished and groomed; neck is supple  CARDIOVASCULAR: Examination of carotid arteries is normal; no carotid bruits Regular rate and rhythm, no murmurs Examination of peripheral vascular system by  observation and palpation is normal  EYES: Ophthalmoscopic exam of optic discs and posterior segments is normal; no papilledema or hemorrhages No results found.  MUSCULOSKELETAL: Gait, strength, tone, movements noted in Neurologic exam below  NEUROLOGIC: MENTAL STATUS:  No flowsheet data found. awake, alert, oriented to person, place and time recent and remote memory intact normal attention and concentration language fluent, comprehension intact, naming intact fund of knowledge appropriate  CRANIAL NERVE:  2nd - no papilledema on fundoscopic exam 2nd, 3rd, 4th, 6th - pupils equal and reactive to light, visual fields full to confrontation, extraocular muscles intact, no nystagmus 5th - facial sensation symmetric 7th - facial strength symmetric 8th - hearing intact 9th - palate elevates symmetrically, uvula midline 11th - shoulder shrug symmetric 12th - tongue protrusion midline  MOTOR:  normal bulk and tone, full strength in the BUE BLE 4 prox, 5 distal  SENSORY:  normal and symmetric to light touch, temperature, vibration  COORDINATION:  finger-nose-finger, fine finger movements normal  REFLEXES:  deep tendon reflexes present and symmetric  GAIT/STATION:  narrow based gait; USING CANE  Lab Results  Component Value Date   WBC 15.2 (H) 12/14/2021   HGB 10.4 (L) 12/14/2021   HCT 34.5 (L) 12/14/2021   MCV 70.8 (L) 12/14/2021   PLT 299 12/14/2021    CMP Latest Ref Rng & Units 12/13/2021 12/07/2021 06/14/2021  Glucose 70 - 99 mg/dL 121(H) 115(H) 103(H)  BUN 6 - 20 mg/dL 14 14 11   Creatinine 0.44 - 1.00 mg/dL 0.63 0.61 0.72  Sodium  135 - 145 mmol/L 134(L) 138 139  Potassium 3.5 - 5.1 mmol/L 3.2(L) 3.4(L) 4.2  Chloride 98 - 111 mmol/L 102 104 105  CO2 22 - 32 mmol/L 24 26 23   Calcium 8.9 - 10.3 mg/dL 10.6(H) 10.6(H) 10.7(H)  Total Protein 6.5 - 8.1 g/dL 7.4 - 6.4  Total Bilirubin 0.3 - 1.2 mg/dL 0.6 - <0.2  Alkaline Phos 38 - 126 U/L 92 - 96  AST 15 - 41 U/L 84(H)  - 37  ALT 0 - 44 U/L 99(H) - 43(H)     01/07/19 MRI brain  - No change since September of 2018. Multiple cerebral hemispheric white matter lesions consistent with chronic multiple sclerosis. No new or progressive lesions. No lesions show  restricted diffusion or contrast enhancement.  01/07/19 MRI lumbar spine - L4-5: Bilateral facet arthropathy with gaping, fluid-filled joints. Mild biforaminal disc bulges. No visible neural compression in this position. With standing or flexion, the patient could develop anterolisthesis, which could be significant. Facet arthropathy could certainly be a cause of back pain or referred facet syndrome pain as well.  - L5-S1: Small left paracentral disc protrusion which approaches the left S1 nerve but does not visibly compress or displace it. It is possible this could be associated with left S1 nerve irritation. Mild facet osteoarthritis at this leel could contribute to back pain.    06/25/21 MRI of the brain with and without contrast shows the following: 1.   Multiple T2/FLAIR hyperintense foci in the hemispheres in a pattern and configuration consistent with chronic demyelinating plaque associated with multiple sclerosis.  None of the foci appear to be acute.  They do not enhance.  Compared to the MRI dated 02/05/2019, there are no new lesions 2.   5 to 6 mm right parietal enhancing mass most consistent with a small meningioma, unchanged in appearance compared to the 02/05/2019 MRI. 3.   No acute findings.  06/25/21 MRI of the lumbar spine without contrast shows the following: 1.   At L4-L5, there is 1 to 2 mm anterolisthesis associated with facet hypertrophy and bulging of the uncovered disc.  This causes mild to moderate right greater than left foraminal and lateral recess stenosis though there does not appear to be nerve root compression.  Compared to the 2020 MRI, joint effusions are no longer noted but the anterolisthesis has developed.  2.  At L5-S1, there is a  stable small left paramedian disc protrusion and mild facet hypertrophy causing moderate left lateral recess stenosis though the S1 nerve root does not appear to be compressed.    Assessment and Plan:  51 y.o. female with:   Dx:  1. MS (multiple sclerosis) ([place])   2. High risk medication use        MULTIPLE SCLEROSIS (relapsing, remitting multiple sclerosis; established, stable) - continue ocrevus - continue exercises and PT - continue vitamin D - monitor CBC, CMP, Ig levels annually   LIGHTHEADED / DIZZINESS - due to dehydration since gastric sleep surg; increase hydration  LEFT LEG PAIN / BACK PAIN (persistent; sciatica; ? piriformis syndrome) - follow up with Dr. Jimmye Norman (chiro); limitation of sitting, standing and mobility due to pain   OBESITY - s/p gastric sleeve (12/13/21); doing well   MIGRAINE HEADACHES (established, stable) - monitor for now   MENINGIOMA (small, asymptomatic) - incidental finding; reassured patient    Follow Up Instructions:  - Return in about 1 year (around 01/10/2023).     Penni Bombard, MD 01/10/2022, 3:07 PM  Certified in Neurology, Neurophysiology and [facility] [address] [place], [place] [address] [phone]

## 2022-01-11 ENCOUNTER — Encounter: Payer: Self-pay | Admitting: *Deleted

## 2022-01-12 ENCOUNTER — Encounter: Payer: Self-pay | Admitting: *Deleted

## 2022-01-13 ENCOUNTER — Encounter: Payer: Self-pay | Admitting: Neurology

## 2022-01-13 ENCOUNTER — Ambulatory Visit: Payer: Medicare Other | Admitting: Neurology

## 2022-01-13 VITALS — BP 106/75 | HR 91 | Ht 63.0 in | Wt 274.8 lb

## 2022-01-13 DIAGNOSIS — G4733 Obstructive sleep apnea (adult) (pediatric): Secondary | ICD-10-CM | POA: Diagnosis not present

## 2022-01-13 DIAGNOSIS — Z9884 Bariatric surgery status: Secondary | ICD-10-CM

## 2022-01-13 DIAGNOSIS — G35 Multiple sclerosis: Secondary | ICD-10-CM

## 2022-01-13 DIAGNOSIS — Z6841 Body Mass Index (BMI) 40.0 and over, adult: Secondary | ICD-10-CM

## 2022-01-13 DIAGNOSIS — G43009 Migraine without aura, not intractable, without status migrainosus: Secondary | ICD-10-CM | POA: Diagnosis not present

## 2022-01-13 DIAGNOSIS — Z9989 Dependence on other enabling machines and devices: Secondary | ICD-10-CM

## 2022-01-13 NOTE — Patient Instructions (Addendum)
Thank you for choosing [facility] for your sleep related care! It was nice to meet you today! I appreciate that you entrust me with your sleep related healthcare concerns. I hope, I was able to address at least some of your concerns today, and that I can help you feel reassured and also get better.    Here is what we discussed today and what we came up with as our plan for you:    Based on your symptoms and your exam I believe you are still at risk for obstructive sleep apnea and would benefit from reevaluation as it has been many years and you need new supplies and an updated machine. Therefore, I think we should proceed with a sleep study to determine how severe your sleep apnea is. If you have more than mild OSA, I want you to consider ongoing treatment with CPAP. Please remember, the risks and ramifications of moderate to severe obstructive sleep apnea or OSA are: Cardiovascular disease, including congestive heart failure, stroke, difficult to control hypertension, arrhythmias, and even type 2 diabetes has been linked to untreated OSA. Sleep apnea causes disruption of sleep and sleep deprivation in most cases, which, in turn, can cause recurrent headaches, problems with memory, mood, concentration, focus, and vigilance. Most people with untreated sleep apnea report excessive daytime sleepiness, which can affect their ability to drive. Please do not drive if you feel sleepy.   I will likely see you back after your sleep study to go over the test results and where to go from there. We will call you after your sleep study to advise about the results (most likely, you will hear from Kristen, my nurse) and to set up an appointment at the time, as necessary.    Our sleep lab administrative assistant will call you to schedule your sleep study. If you don't hear back from her by about 2 weeks from now, please feel free to call her at [phone]. You can leave a message with your phone  number and concerns, if you get the voicemail box. She will call back as soon as possible.     

## 2022-01-13 NOTE — Progress Notes (Signed)
Subjective:    Patient ID: [name] is a 51 y.o. female.  HPI    Star Age, MD, PhD [facility] [address], [address] [place], [place] [address]  Dear Domingo Mend,   I saw your patient, [name], in my sleep clinic today for initial consultation of her sleep disorder, in particular, evaluation of her prior diagnosis of OSA.  The patient is unaccompanied today.  As you know, Ms. Fromer is a 51 year old right-handed woman with an underlying medical history of sickle cell trait, anemia, allergies, anxiety, depression, hypertension, migraine headaches, MS (followed by my colleague, Dr. Leta Baptist), prediabetes and morbid obesity with a BMI of over 61, with recent status post weight loss surgery, who was previously diagnosed with obstructive sleep apnea and placed on positive airway pressure treatment.  I reviewed your office note from 12/27/2021.  She had recent weight loss surgery, status post laparoscopic sleeve gastrectomy on 12/13/2021.  Prior sleep study results are not available for my review today.  I was able to review her CPAP compliance data from the past 3 months, her average pressure was around 13 cm, she is compliant with treatment with an average usage of 6 hours and 14 minutes, residual AHI 1.4/h, she is on an AutoPap machine from [facility], pressure settings at the default setting of 5 to 20 cm, leak on the lower side.  Interestingly, in the past month, since her weight loss surgery her average pressure was decreased to 3.8 cm.  He continues to be compliant with treatment.  She has benefited from AutoPap therapy over time.  She recalls that she was diagnosed with significant sleep apnea in or around 2008.  This is her second machine.  Her Epworth sleepiness score is 3 out of 24, fatigue severity score is 12 out of 63.  She is currently not working, she worked as a Psychologist, sport and exercise.  She lives at home with her 2 daughters and 2 grandchildren, 39-year-old  grandson and 46 year old granddaughter.  Bedtime is generally around 11 or midnight but can be as late as 2 AM, rise time between 6 and 7.  She has nocturia about once per average night and denies recurrent morning headaches.  She has gained upwards of 20 pounds since her weight loss surgery thus far.  She drinks no caffeine currently, no alcohol, she quit smoking in October 2022.  She uses a nasal pillows interface and tolerates it well.  She has noticed that the headgear has become a little loose.  She has a family history of sleep apnea affecting her maternal aunt and uncle.  She would like to get reevaluated over sleep apnea, in the least to see if she would qualify for a new machine and a pressure adjustment.  Her Past Medical History Is Significant For: Past Medical History:  Diagnosis Date   Allergy    Anemia 2008   Anxiety    Depression    Hypertension    Iron overload, transfusional    Migraine    MS (multiple sclerosis) (HCC)    OSA on CPAP    C-PAP   Pre-diabetes    Sickle cell trait (HCC)    Tachycardia     Her Past Surgical History Is Significant For: Past Surgical History:  Procedure Laterality Date   CERVICAL ABLATION     CESAREAN SECTION     3 times   CHOLECYSTECTOMY  1990   HIATAL HERNIA REPAIR N/A 12/13/2021   Procedure: HERNIA REPAIR HIATAL;  Surgeon: [name], MD;  Location: [facility];  Service: General;  Laterality: N/A;   LASER ABLATION OF THE CERVIX     TUBAL LIGATION  1995   UPPER GI ENDOSCOPY N/A 12/13/2021   Procedure: UPPER GI ENDOSCOPY;  Surgeon: [name], MD;  Location: [facility];  Service: General;  Laterality: N/A;    Her Family History Is Significant For: Family History  Problem Relation Age of Onset   Mental illness Mother    Hyperlipidemia Mother    Hyperthyroidism Mother    Mental retardation Mother    Other Mother        Covid   Diabetes Father    Cancer Father    Cancer Paternal Aunt        breast   Cancer Paternal Aunt         breast   Arthritis Maternal Grandmother    Diabetes Maternal Grandmother    Hearing loss Maternal Grandmother        left hear when she was a child   Hyperlipidemia Maternal Grandmother    Hypertension Maternal Grandmother    Alcohol abuse Maternal Grandfather    Early death Maternal Grandfather    ADD / ADHD Son    Multiple sclerosis Cousin    Breast cancer Neg Hx    Colon cancer Neg Hx    Esophageal cancer Neg Hx    Inflammatory bowel disease Neg Hx    Liver disease Neg Hx    Pancreatic cancer Neg Hx    Stomach cancer Neg Hx    Rectal cancer Neg Hx    Sleep apnea Neg Hx     Her Social History Is Significant For: 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, answers calls    Employer: [employer]    Comment: [employer]  Tobacco Use   Smoking status: Former    Packs/day: 0.50    Years: 25.00    Pack years: 12.50    Types: Cigarettes    Quit date: 09/16/2021    Years since quitting: 0.3   Smokeless tobacco: Never   Tobacco comments:    12/25/18  quit 2 months ago  Vaping Use   Vaping Use: Never used  Substance and Sexual Activity   Alcohol use: Yes    Alcohol/week: 0.0 standard drinks    Comment: once maybe a month if that   Drug use: No   Sexual activity: Yes    Partners: Male    Birth control/protection: Condom  Other Topics Concern   Not on file  Social History Narrative   Patient lives at home with her family.   Working at the call center for [employer].    Social Determinants of Health   Financial Resource Strain: Not on file  Food Insecurity: Not on file  Transportation Needs: Not on file  Physical Activity: Not on file  Stress: Not on file  Social Connections: Not on file    Her Allergies Are:  Allergies  Allergen Reactions   Iron  Shortness Of Breath    IV only    Tysabri [Natalizumab] Other (See Comments)    Shortness of breath, joint pain, tremors   Delsym [Dextromethorphan Polistirex Er] Other (See Comments)    nightmares  :  Her Current Medications Are:  Outpatient Encounter Medications as of 01/13/2022  Medication Sig   Accu-Chek FastClix Lancets MISC Use to check blood sugar 2 times a day   atorvastatin (LIPITOR) 10 MG tablet Take one tablet (10 mg dose) by mouth daily.   Blood Glucose Monitoring Suppl (ACCU-CHEK GUIDE) w/Device KIT Use to test blood sugar 2 times a day   glucose blood test strip Use to check blood sugar 2 times a day   Ocrelizumab (OCREVUS IV) Inject into the vein See admin instructions. Every 6 months   pantoprazole (PROTONIX) 40 MG tablet Take 1 tablet by mouth daily.   telmisartan-hydrochlorothiazide (MICARDIS HCT) 80-25 MG tablet Take one tablet by mouth daily.   valACYclovir (VALTREX) 1000 MG tablet Take one tablet by mouth 2 times daily. Start with first symptom of outbreak. (Patient taking differently: as needed.)   amLODipine (NORVASC) 5 MG tablet Take one tablet (5 mg dose) by mouth daily.   cholecalciferol (VITAMIN D3) 25 MCG (1000 UNIT) tablet Take 1,000 Units by mouth daily.   ondansetron (ZOFRAN-ODT) 4 MG disintegrating tablet Allow 1 tablet to dissolve by mouth every 6  hours as needed for nausea or vomiting.   oxyCODONE (OXY IR/ROXICODONE) 5 MG immediate release tablet Take 1 tablet by mouth every 6 hours as needed for severe pain.   No facility-administered encounter medications on file as of 01/13/2022.  :   Review of Systems:  Out of a complete 14 point review of systems, all are reviewed and negative with the exception of these symptoms as listed below:  Review of Systems  Neurological:        Pt is here with Sleep Consult  Pt states she has had a CPAP machine since 2015 or before Sleep study was in 2015 or before per patient . Pt states she sleep with CAPA every night .  Patient brought CPAP for Appointment   ESS:3 FSS:12   Objective:  Neurological Exam  Physical Exam Physical Examination:   Vitals:   01/13/22 1534  BP: 106/75  Pulse: 91    General Examination: The patient is a very pleasant 51 y.o. female in no acute distress. She appears well-developed and well-nourished and well groomed.   HEENT: Normocephalic, atraumatic, pupils are equal, round and reactive to light, extraocular tracking is good without limitation to gaze excursion or nystagmus noted. Hearing is grossly intact. Face is symmetric with normal facial animation. Speech is clear with no dysarthria noted. There is no hypophonia. There is no lip, neck/head, jaw or voice tremor. Neck is supple with full range of passive and active motion. There are no carotid bruits on auscultation. Oropharynx exam reveals: mild mouth dryness, good dental hygiene and moderate airway crowding, due to tonsils of about 2+, smaller airway. Mallampati is class II. Tongue protrudes centrally and palate elevates symmetrically. Neck size is 17 inches.  She has a mild overbite.   Chest: Clear to auscultation without wheezing, rhonchi or crackles noted.  Heart: S1+S2+0, regular and normal without murmurs, rubs or gallops noted.   Abdomen: Soft, non-tender and non-distended with normal bowel sounds appreciated on auscultation.  Extremities: There is no obvious edema.    Skin: Warm and dry without trophic changes noted.   Musculoskeletal: exam reveals no obvious joint deformities.   Neurologically:  Mental status: The patient is awake, alert and oriented in all 4 spheres. Her immediate and remote memory, attention, language skills and fund of knowledge are appropriate. There is no evidence of aphasia, agnosia,  apraxia or anomia. Speech is clear with normal prosody and enunciation. Thought process is linear. Mood is normal and affect is normal.  Cranial nerves II - XII are as described above under HEENT exam.   Motor exam: Normal bulk, strength and tone is noted the exception of mild left leg weakness.  She walks with a single-point cane.  Fine motor skills are grossly intact.  No obvious cerebellar signs on exam. Sensory exam: intact to light touch in the upper and lower extremities.  Gait, station and balance: She is, she walks slowly with a cane.  Assessment and Plan:  In summary, SHERBY MONCAYO is a very pleasant 51 y.o.-year old female with an underlying medical history of sickle cell trait, anemia, allergies, anxiety, depression, hypertension, migraine headaches, MS (followed by my colleague, Dr. Leta Baptist), prediabetes and morbid obesity with a BMI of over 7, with recent status post weight loss surgery, who presents for evaluation of her obstructive sleep apnea.  She has been on positive airway pressure treatment for years, she indicates ongoing benefit from treatment but has had weight changes, particularly in the past month since her weight loss surgery.  She is compliant with her AutoPap therapy and average pressure in the past month has decreased from an average of 13 cm to 8 cm on a Default AutoPap setting.  She should be eligible for new machine and we mutually agreed to proceed with a split-night sleep study to reassess her sleep apnea and also put her on CPAP if possible.  She is agreeable to this approach.  She is commended for her treatment adherence and advised to continue to use her current AutoPap machine for now.  We talked about the importance of keeping a good sleep schedule and sleep habits.  We talked about the importance of treating significant obstructive sleep apnea to reduce cardiovascular risks long-term.  She is motivated to continue with treatment and we will plan to follow-up after testing, we will keep her posted as to her test results by phone call as well.  I answered all her questions today and she was in agreement. Thank you very much for allowing me to participate in the care  of this nice patient. If I can be of any further assistance to you please do not hesitate to call me at [phone].  Sincerely,   Star Age, MD, PhD

## 2022-01-13 NOTE — Progress Notes (Deleted)
Subjective:  ?  ?Patient ID: [name] is a 51 y.o. female. ? ?HPI ?{Common ambulatory SmartLinks:[id number]} ? ?Review of Systems ? ?Objective:  ?Neurological Exam ? ?Physical Exam ? ?Assessment:  ? ?*** ? ?Plan:  ? ?*** ? ?

## 2022-01-17 ENCOUNTER — Ambulatory Visit (INDEPENDENT_AMBULATORY_CARE_PROVIDER_SITE_OTHER): Payer: Medicare Other | Admitting: Neurology

## 2022-01-17 DIAGNOSIS — G35 Multiple sclerosis: Secondary | ICD-10-CM

## 2022-01-17 DIAGNOSIS — G4720 Circadian rhythm sleep disorder, unspecified type: Secondary | ICD-10-CM

## 2022-01-17 DIAGNOSIS — G4733 Obstructive sleep apnea (adult) (pediatric): Secondary | ICD-10-CM | POA: Diagnosis not present

## 2022-01-17 DIAGNOSIS — Z9884 Bariatric surgery status: Secondary | ICD-10-CM

## 2022-01-17 DIAGNOSIS — G43009 Migraine without aura, not intractable, without status migrainosus: Secondary | ICD-10-CM

## 2022-01-28 NOTE — Procedures (Signed)
PATIENT'S NAME:  [name], [place] ?DOB:      1971/10/27      ?MR#:    [id number]     ?DATE OF RECORDING: 01/17/2022 ?REFERRING M.D.:  Harrison Mons, [place] ?Study Performed:   Baseline Polysomnogram ?HISTORY: 51 year old woman with a history of sickle cell trait, anemia, allergies, anxiety, depression, hypertension, migraine headaches, MS, pre-diabetes and morbid obesity with a BMI of over 45, with recent status post weight loss surgery, who was previously diagnosed with obstructive sleep apnea and placed on positive airway pressure treatment. She presents for re-evaluation. The patient endorsed the Epworth Sleepiness Scale at 3 points. The patient's weight 274 pounds with a height of 63 (inches), resulting in a BMI of 48.4 kg/m2. The patient's neck circumference measured 17 inches. ? ?CURRENT MEDICATIONS: Lipitor, Ocrevus IV, Protonix, Micardis HCT, Valtrex, Norvasc, Vitmain D3, Zofran, Oxycodone ?  ?PROCEDURE:  This is a multichannel digital polysomnogram utilizing the Somnostar 11.2 system.  Electrodes and sensors were applied and monitored per AASM Specifications.   EEG, EOG, Chin and Limb EMG, were sampled at 200 Hz.  ECG, Snore and Nasal Pressure, Thermal Airflow, Respiratory Effort, CPAP Flow and Pressure, Oximetry was sampled at 50 Hz. Digital video and audio were recorded.     ? ?BASELINE STUDY ? ?Lights Out was at 22:30 and Lights On at 04:58.  Total recording time (TRT) was 388 minutes, with a total sleep time (TST) of 194 minutes.   The patient's sleep latency was 110.5 minutes, which is delayed. The patient did not meet criteria for a split study, due to sleep onset delay. REM latency was 83 minutes, which is normal.  The sleep efficiency was 50%, which is reduced.  ?   ?SLEEP ARCHITECTURE: WASO (Wake after sleep onset) was 28.5 minutes.  There were 22 minutes in Stage N1, 100.5 minutes Stage N2, 41.5 minutes Stage N3 and 30 minutes in Stage REM.  The percentage of Stage N1 was 11.3%, which is increased, Stage N2  was 51.8%, which is normal, Stage N3 was 21.4% and Stage R (REM sleep) was 15.5%, which is mildly reduced. The arousals were noted as: 38 were spontaneous, 0 were associated with PLMs, 28 were associated with respiratory events. ? ?RESPIRATORY ANALYSIS:  There were a total of 98 respiratory events:  22 obstructive apneas, 0 central apneas and 0 mixed apneas with a total of 22 apneas and an apnea index (AI) of 6.8 /hour. There were 76 hypopneas with a hypopnea index of 23.5 /hour. The patient also had 0 respiratory event related arousals (RERAs).  ?    ?The total APNEA/HYPOPNEA INDEX (AHI) was 30.3/hour and the total RESPIRATORY DISTURBANCE INDEX was  30.3 /hour.  9 events occurred in REM sleep and 136 events in NREM. The REM AHI was  18 /hour, versus a non-REM AHI of 32.6. The patient spent 181 minutes of total sleep time in the supine position and 13 minutes in non-supine.. The supine AHI was 29.2 versus a non-supine AHI of 46.2. ? ?OXYGEN SATURATION & C02:  The Wake baseline 02 saturation was 93%, with the lowest being 85%. Time spent below 89% saturation equaled 2 minutes. ? ?PERIODIC LIMB MOVEMENTS: The patient had a total of 0 Periodic Limb Movements.  The Periodic Limb Movement (PLM) index was 0 and the PLM Arousal index was 0/hour. ? ?Audio and video analysis did not show any abnormal or unusual movements, behaviors, phonations or vocalizations. The patient took 2 bathroom breaks. Mild intermittent snoring was noted. The EKG was  in keeping with normal sinus rhythm (NSR). ? ?Post-study, the patient indicated that sleep was worse than usual.  ? ?IMPRESSION: ? ?Obstructive Sleep Apnea (OSA) ?Dysfunctions associated with sleep stages or arousal from sleep ? ?RECOMMENDATIONS: ? ?This study demonstrates borderline-severe obstructive sleep apnea, with a total AHI of 30.3/hour and O2 nadir of 85%. Ongoing treatment with positive airway pressure with autoPAP is recommended. Weight loss will likely aid in reducing the  severity of her OSA. I will write for a new autoPAP machine, if she is eligible. Please note that untreated obstructive sleep apnea may carry additional perioperative morbidity. Patients with significant obstructive sleep apnea should receive perioperative PAP therapy and the surgeons and particularly the anesthesiologist should be informed of the diagnosis and the severity of the sleep disordered breathing. ?This study shows sleep fragmentation and abnormal sleep stage percentages; these are nonspecific findings and per se do not signify an intrinsic sleep disorder or a cause for the patient's sleep-related symptoms. Causes include (but are not limited to) the first night effect of the sleep study, circadian rhythm disturbances, medication effect or an underlying mood disorder or medical problem.  ?The patient should be cautioned not to drive, work at heights, or operate dangerous or heavy equipment when tired or sleepy. Review and reiteration of good sleep hygiene measures should be pursued with any patient. ?The patient will be seen in follow-up in the sleep clinic at [facility] for discussion of the test results, symptom and treatment compliance review, further management strategies, etc. The referring provider will be notified of the test results. ? ?I certify that I have reviewed the entire raw data recording prior to the issuance of this report in accordance with the Standards of Accreditation of the [place] [employer] (AASM) ? ?Star Age, MD, PhD ?Diplomat, American Board of Neurology and Sleep Medicine (Neurology and Sleep Medicine) ? ? ?

## 2022-01-28 NOTE — Addendum Note (Signed)
Addended by: Star Age on: 01/28/2022 01:59 PM ? ? Modules accepted: Orders ? ?

## 2022-01-29 ENCOUNTER — Other Ambulatory Visit (HOSPITAL_COMMUNITY): Payer: Self-pay

## 2022-01-31 ENCOUNTER — Telehealth: Payer: Self-pay | Admitting: *Deleted

## 2022-01-31 NOTE — Telephone Encounter (Signed)
-----   Message from Star Age, MD sent at 01/28/2022  1:59 PM EDT ----- ?Patient was referred by PCP for re-eval of her OSA. She may qualify for a new machine. She has been on autoPAP and had weight loss surgery in Jan. ?Please advise pt that her sleep study showed sleep apnea in the borderline-severe range. I would recommend she continue to use her autoPAP. I will order a new machine and we can send the order to her DME. She will need a FU according to her set up date, please remind pt to continue to be compliant with treatment.  ?

## 2022-01-31 NOTE — Telephone Encounter (Signed)
LVM for patient to call back to over sleep study results  ?

## 2022-02-03 NOTE — Telephone Encounter (Signed)
Called pt & LVM (2nd attempt), with office hours and number, asking for call back.  ?

## 2022-02-03 NOTE — Telephone Encounter (Signed)
Patient returned my call and we discussed the sleep study results. Pt is agreeable to move forward with continued autoPAP use and will get a new machine from [facility], the [place] that she states her insurance is in-network with.  She will be on the look out for a call  from [facility] and will call them if she does not hear within 1 week.  Also schedule patient for an initial follow-up with Dr. Rexene Alberts on Thursday, June 15 at 10:45 AM arrival 1030 with machine and power cord.  Pt's questions were answered and she verbalized appreciation for the call.  ? ?Orders sent to [facility]. Received a receipt of confirmation. ? ?

## 2022-02-07 ENCOUNTER — Encounter: Payer: Medicare Other | Attending: Surgery | Admitting: Skilled Nursing Facility1

## 2022-02-07 ENCOUNTER — Other Ambulatory Visit: Payer: Self-pay

## 2022-02-07 DIAGNOSIS — I10 Essential (primary) hypertension: Secondary | ICD-10-CM | POA: Diagnosis not present

## 2022-02-07 DIAGNOSIS — G35 Multiple sclerosis: Secondary | ICD-10-CM | POA: Insufficient documentation

## 2022-02-07 DIAGNOSIS — R7303 Prediabetes: Secondary | ICD-10-CM | POA: Diagnosis not present

## 2022-02-07 DIAGNOSIS — Z9884 Bariatric surgery status: Secondary | ICD-10-CM | POA: Diagnosis not present

## 2022-02-07 DIAGNOSIS — Z6841 Body Mass Index (BMI) 40.0 and over, adult: Secondary | ICD-10-CM | POA: Diagnosis not present

## 2022-02-07 DIAGNOSIS — E66813 Obesity, class 3: Secondary | ICD-10-CM

## 2022-02-07 DIAGNOSIS — Z713 Dietary counseling and surveillance: Secondary | ICD-10-CM | POA: Diagnosis not present

## 2022-02-07 NOTE — Progress Notes (Signed)
Bariatric Nutrition Follow-Up Visit ?Medical Nutrition Therapy  ? ? ?NUTRITION ASSESSMENT ?  ? ?Surgery date: 12/13/2021 ?Surgery type: sleeve ?Start weight at [facility]: 307 pounds ?Weight: 265.2 pounds ?  ?Body Composition Scale 12/29/2021 02/07/2022  ?Current Body Weight 280.2 265.2  ?Total Body Fat % 48.7 48.3  ?Visceral Fat 21 20  ?Fat-Free Mass % 51.2 51.6  ? Total Body Water % 40.1 40.3  ?Muscle-Mass lbs 30.9 29.8  ?BMI 48.6 47.8  ?Body Fat Displacement    ?       Torso  lbs 84.8 79.4  ?       Left Leg  lbs 16.9 15.8  ?       Right Leg  lbs 16.9 15.8  ?       Left Arm  lbs 8.4 7.9  ?       Right Arm   lbs 8.4 7.9  ? ?Clinical  ?Medical hx: DM, MS ?Medications: blood pressure  ?Labs: A1C 6.5; CO2 18, calcium 11.4, AST 54, ALT 62 ?Notable signs/symptoms: MS pain, back and sciatica pain ?Any previous deficiencies? No ?  ?Lifestyle & Dietary Hx ? ?Pt states she logs her foods and fluids stating it works well for her.   ?Pt state she did like the edamame.  ?Pt states she ate the wheat thins at the direction of her girlfriends.  ?Pt states she sees where wheat thins fill her up. Pt states she knows she is full when she is burping: dietitian advised she actually stop 1 bite before she has the burps. Pt states it is exciting to feel the energy after she walks. Pt states she is more active than she ever has been in the past.  ?Pt states she got a new sleep study. ? ?Estimated daily fluid intake: 50.7 oz ?Estimated daily protein intake: 60+ g ?Supplements: multi and calcium  ?Current average weekly physical activity: 3 days a week: 23-60 minutes walking   ? ?24-Hr Dietary Recall ?First Meal: seafood broil or [place] sausage + 1 egg or refried beans + egg + sausage ?Snack:  wheat thins ?Second Meal: tuna + wheat thins or seafood broil or protein shake ?Snack:  sugar free pudding  ?Third Meal: seafood broil or chicken or pork or steak or shrimp ?Snack: sugar free popcicle or wheat thins ?Beverages: water ? ?Post-Op Goals/  Signs/ Symptoms ?Using straws: no ?Drinking while eating: no ?Chewing/swallowing difficulties: no ?Changes in vision: no ?Changes to mood/headaches: no ?Hair loss/changes to skin/nails: no ?Difficulty focusing/concentrating: no ?Sweating: no ?Limb weakness: no ?Dizziness/lightheadedness: no ?Palpitations: no  ?Carbonated/caffeinated beverages: no ?N/V/D/C/Gas: no; using mirilax daily ?Abdominal pain: no ?Dumping syndrome: no ? ?  ?NUTRITION DIAGNOSIS  ?Overweight/obesity ([place]-3.3) related to past poor dietary habits and physical inactivity as evidenced by completed bariatric surgery and following dietary guidelines for continued weight loss and healthy nutrition status. ?  ?  ?NUTRITION INTERVENTION ?Nutrition counseling (C-1) and education (E-2) to facilitate bariatric surgery goals, including: ?Diet advancement to the next phase (phase 4) now including non starchy vegetables  ?The importance of consuming adequate calories as well as certain nutrients daily due to the body's need for essential vitamins, minerals, and fats ?The importance of daily physical activity and to reach a goal of at least 150 minutes of moderate to vigorous physical activity weekly (or as directed by their physician) due to benefits such as increased musculature and improved lab values ?The importance of intuitive eating specifically learning hunger-satiety cues and understanding the importance of learning a  new body: The importance of mindful eating to avoid grazing behaviors  ? ?Goals: ?-Continue to aim for a minimum of 64 fluid ounces 7 days a week with at least 30 ounces being plain water: try alkaline water and try unsweet tea ? ?-Eat non-starchy vegetables 2 times a day 7 days a week ? ?-Start out with soft cooked vegetables today and tomorrow; if tolerated begin to eat raw vegetables or cooked including salads ? ?-Eat your 3 ounces of protein first then start in on your non-starchy vegetables; once you understand how much of your meal  leads to satisfaction and not full while still eating 3 ounces of protein and non-starchy vegetables you can eat them in any order  ? ?-Continue to aim for 30 minutes of activity at least 5 times a week ? ?-Do NOT cook with/add to your food: alfredo sauce, cheese sauce, barbeque sauce, ketchup, fat back, butter, bacon grease, grease, Crisco, OR SUGAR ? ? ?Handouts Provided Include  ?Phae 4 ? ?Learning Style & Readiness for Change ?Teaching method utilized: Visual & Auditory  ?Demonstrated degree of understanding via: Teach Back  ?Readiness Level: action ?Barriers to learning/adherence to lifestyle change: none identified  ? ?RD's Notes for Next Visit ?Assess adherence to pt chosen goals  ? ? ?MONITORING & EVALUATION ?Dietary intake, weekly physical activity, body weight ? ?Next Steps ?Patient is to follow-up in 3-4 months  ?

## 2022-03-17 ENCOUNTER — Other Ambulatory Visit (HOSPITAL_COMMUNITY): Payer: Self-pay

## 2022-03-18 ENCOUNTER — Other Ambulatory Visit (HOSPITAL_COMMUNITY): Payer: Self-pay

## 2022-03-18 MED ORDER — PANTOPRAZOLE SODIUM 40 MG PO TBEC
40.0000 mg | DELAYED_RELEASE_TABLET | Freq: Every day | ORAL | 0 refills | Status: DC
  Filled 2022-03-18: qty 90, 90d supply, fill #0

## 2022-03-22 ENCOUNTER — Other Ambulatory Visit (HOSPITAL_COMMUNITY): Payer: Self-pay

## 2022-04-07 ENCOUNTER — Other Ambulatory Visit (HOSPITAL_COMMUNITY): Payer: Self-pay

## 2022-04-07 MED ORDER — VALACYCLOVIR HCL 1 G PO TABS
ORAL_TABLET | ORAL | 0 refills | Status: DC
  Filled 2022-04-07: qty 14, 7d supply, fill #0

## 2022-04-26 ENCOUNTER — Encounter: Payer: Medicare Other | Attending: Surgery | Admitting: Skilled Nursing Facility1

## 2022-04-26 DIAGNOSIS — Z713 Dietary counseling and surveillance: Secondary | ICD-10-CM | POA: Insufficient documentation

## 2022-04-26 DIAGNOSIS — Z6841 Body Mass Index (BMI) 40.0 and over, adult: Secondary | ICD-10-CM | POA: Insufficient documentation

## 2022-04-26 DIAGNOSIS — R7303 Prediabetes: Secondary | ICD-10-CM | POA: Diagnosis not present

## 2022-04-26 DIAGNOSIS — I10 Essential (primary) hypertension: Secondary | ICD-10-CM | POA: Diagnosis not present

## 2022-04-26 DIAGNOSIS — G35 Multiple sclerosis: Secondary | ICD-10-CM | POA: Diagnosis not present

## 2022-04-28 ENCOUNTER — Encounter: Payer: Self-pay | Admitting: Skilled Nursing Facility1

## 2022-04-28 ENCOUNTER — Encounter: Payer: Self-pay | Admitting: Neurology

## 2022-04-28 ENCOUNTER — Ambulatory Visit (INDEPENDENT_AMBULATORY_CARE_PROVIDER_SITE_OTHER): Payer: Medicare Other | Admitting: Neurology

## 2022-04-28 VITALS — BP 128/80 | HR 63 | Ht 62.0 in | Wt 243.0 lb

## 2022-04-28 DIAGNOSIS — R634 Abnormal weight loss: Secondary | ICD-10-CM | POA: Diagnosis not present

## 2022-04-28 DIAGNOSIS — Z9989 Dependence on other enabling machines and devices: Secondary | ICD-10-CM

## 2022-04-28 DIAGNOSIS — G4733 Obstructive sleep apnea (adult) (pediatric): Secondary | ICD-10-CM

## 2022-04-28 NOTE — Progress Notes (Signed)
Follow-up visit:  Post-Operative sleeve Surgery  Medical Nutrition Therapy:  Appt start time: 6:00pm end time:  7:00pm  Primary concerns today: Post-operative Bariatric Surgery Nutrition Management 6 Month Post-Op Class  Surgery date: 12/13/2021 Surgery type: sleeve Start weight at [facility]: 307 pounds Weight: 243 pounds   Body Composition Scale 12/29/2021 02/07/2022 04/28/2022  Current Body Weight 280.2 265.2 243  Total Body Fat % 48.7 48.3 46.6  Visceral Fat '21 20 18  '$ Fat-Free Mass % 51.2 51.6 53.3   Total Body Water % 40.1 40.3 41.1  Muscle-Mass lbs 30.9 29.8 29.3  BMI 48.6 47.8 44.3  Body Fat Displacement            Torso  lbs 84.8 79.4 70.2         Left Leg  lbs 16.9 15.8 14         Right Leg  lbs 16.9 15.8 14         Left Arm  lbs 8.4 7.9 7         Right Arm   lbs 8.4 7.9 7   Clinical  Medical hx: DM, MS Medications: blood pressure  Labs: A1C 6.5; CO2 18, calcium 11.4, AST 54, ALT 62 Notable signs/symptoms: MS pain, back and sciatica pain Any previous deficiencies? No    Information Reviewed/ Discussed During Appointment: -Review of composition scale numbers -Fluid requirements (64-100 ounces) -Protein requirements (60-80g) -Strategies for tolerating diet -Advancement of diet to include Starchy vegetables -Barriers to inclusion of new foods -Inclusion of appropriate multivitamin and calcium supplements  -Exercise recommendations   Fluid intake: adequate   Medications: See List Supplementation: appropriate     Using straws: no Drinking while eating: no Having you been chewing well: yes Chewing/swallowing difficulties: no Changes in vision: no Changes to mood/headaches: no Hair loss/Cahnges to skin/Changes to nails: no Any difficulty focusing or concentrating: no Sweating: no Dizziness/Lightheaded: no Palpitations: no  Carbonated beverages: no N/V/D/C/GAS: no Abdominal Pain: no Dumping syndrome: no  Recent physical activity:  ADL's  Progress  Towards Goal(s):  In Progress Teaching method utilized: Visual & Auditory  Demonstrated degree of understanding via: Teach Back  Readiness Level: Action Barriers to learning/adherence to lifestyle change: none identified  Handouts given during visit include: Phase V diet Progression  Goals Sheet The Benefits of Exercise are endless..... Support Group Topics    Teaching Method Utilized:  Visual Auditory Hands on  Demonstrated degree of understanding via:  Teach Back   Monitoring/Evaluation:  Dietary intake, exercise, and body weight. Follow up in 3 months for 9 month post-op visit.

## 2022-04-28 NOTE — Progress Notes (Signed)
Subjective:    [name] ID: [name] is a 51 y.o. female.  HPI    Interim history:   [name] is a 51 year old right-handed woman with an underlying medical history of sickle cell trait, anemia, allergies, anxiety, depression, hypertension, migraine headaches, MS (followed by my colleague, Dr. Leta Baptist), prediabetes and morbid obesity with a BMI of over 37, with recent status post weight loss surgery, who presents for follow-up consultation of [name] obstructive sleep apnea after interim sleep testing and starting a new AutoPap machine.  [name] is unaccompanied today.  I first met [name] at Jodi request of [name] primary care provider on 01/13/2022, at which time [name] reported a prior diagnosis of sleep apnea and [name] was on AutoPap therapy at Jodi time.  [name] had weight loss surgery in late January 2023 and had lost quite a bit of weight.  We proceeded with reevaluation with a sleep study.  [name] had a baseline sleep study on 01/17/2022 which showed an AHI of 30.3/h, O2 nadir 85%.  [name] was advised to proceed with AutoPap therapy with a new machine.  [name] set up date was 02/10/2022.  [name] has a [employer] air sense 11 AutoSet machine.  Today, 04/28/2022: I reviewed [name] AutoPap compliance data from 03/28/2022 through 04/26/2022, which is a total of 30 days, during which time [name] used [name] machine every night with percent use days greater than 4 hours at 97%, indicating excellent compliance with an average usage of 6 hours and 51 minutes, residual AHI at goal at 0.4/h, average pressure for Jodi 95th percentile at 11.6 cm with a range of 6 to 12 cm, leak acceptable with Jodi 95th percentile at 9.1 L/min.  [name] reports doing well.  [name] uses nasal pillows.  [name] has adjusted well to [name] new machine and has had ongoing weight loss.  [name] follows regularly with [name] nutritionist.  [name] hydrates well with water but still has some mouth dryness.  [name] benefits from treatment and is very motivated to continue with [name] AutoPap.  [name] has  noticed an increase in [name] front tooth gap and [name] has an appointment to discuss Invisalign treatment.  [name]'s allergies, current medications, family history, past medical history, past social history, past surgical history and problem list were reviewed and updated as appropriate.   Previously:   01/13/22: ([name]) was previously diagnosed with obstructive sleep apnea and placed on positive airway pressure treatment.  I reviewed your office note from 12/27/2021.  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  Prior sleep study results are not available for my review today.  I was able to review [name] CPAP compliance data from Jodi past 3 months, [name] average pressure was around 13 cm, [name] is compliant with treatment with an average usage of 6 hours and 14 minutes, residual AHI 1.4/h, [name] is on an AutoPap machine from [employer], pressure settings at Jodi default setting of 5 to 20 cm, leak on Jodi lower side.  Interestingly, in Jodi past month, since [name] weight loss surgery [name] average pressure was decreased to 3.8 cm.  He continues to be compliant with treatment.  [name] has benefited from AutoPap therapy over time.  [name] recalls that [name] was diagnosed with significant sleep apnea in or around 2008.  This is [name] second machine.  [name] Epworth sleepiness score is 3 out of 24, fatigue severity score is 12 out of 63.  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  [name] lives at home with [name] 2 daughters and 2 grandchildren, 60-year-old grandson and 70 year old granddaughter.  Bedtime is generally around 11 or midnight but can be as late as 2 AM, rise time between 6 and 7.  [name] has nocturia about once per average night and denies recurrent morning headaches.  [name] has gained upwards of 20 pounds since [name] weight loss surgery thus far.  [name] drinks no caffeine currently, no alcohol, [name] quit smoking in October 2022.  [name] uses a nasal pillows interface and tolerates  it well.  [name] has noticed that Jodi headgear has become a little loose.  [name] has a family history of sleep apnea affecting [name] maternal aunt and uncle.  [name] would like to get reevaluated over sleep apnea, in Jodi least to see if [name] would qualify for a new machine and a pressure adjustment.  [name] Past Medical History Is Significant For: Past Medical History:  Diagnosis Date   Allergy    Anemia 2008   Anxiety    Depression    Hypertension    Iron overload, transfusional    Migraine    MS (multiple sclerosis) (HCC)    OSA on CPAP    C-PAP   Pre-diabetes    Sickle cell trait (HCC)    Tachycardia     [name] Past Surgical History Is Significant For: Past Surgical History:  Procedure Laterality Date   CERVICAL ABLATION     CESAREAN SECTION     3 times   CHOLECYSTECTOMY  1990   HIATAL HERNIA REPAIR N/A 12/13/2021   Procedure: HERNIA REPAIR HIATAL;  Surgeon: [name], MD;  Location: [facility];  Service: General;  Laterality: N/A;   LASER ABLATION OF Jodi CERVIX     TUBAL LIGATION  1995   UPPER GI ENDOSCOPY N/A 12/13/2021   Procedure: UPPER GI ENDOSCOPY;  Surgeon: [name], MD;  Location: [facility];  Service: General;  Laterality: N/A;    [name] Family History Is Significant For: Family History  Problem Relation Age of Onset   Mental illness Mother    Hyperlipidemia Mother    Hyperthyroidism Mother    Mental retardation Mother    Other Mother        Covid   Diabetes Father    Cancer Father    Cancer Paternal Aunt        breast   Cancer Paternal Aunt        breast   Arthritis Maternal Grandmother    Diabetes Maternal Grandmother    Hearing loss Maternal Grandmother        left hear when [name] was a child   Hyperlipidemia Maternal Grandmother    Hypertension Maternal Grandmother    Alcohol abuse Maternal Grandfather    Early death Maternal Grandfather    ADD / ADHD Son    Multiple sclerosis Cousin    Breast cancer Neg Hx    Colon cancer Neg Hx    Esophageal cancer Neg  Hx    Inflammatory bowel disease Neg Hx    Liver disease Neg Hx    Pancreatic cancer Neg Hx    Stomach cancer Neg Hx    Rectal cancer Neg Hx    Sleep apnea Neg Hx     [name] Social History Is Significant For: Social History   Socioeconomic History   Marital status: Divorced    Spouse name: N/A   Number of children: 3   Years of education: 12+   Highest education level: Not  on file  Occupational History   Occupation: [name], answers calls    Employer: [name]  Tobacco Use   Smoking status: Former    Packs/day: 0.50    Years: 25.00    Total pack years: 12.50    Types: Cigarettes    Quit date: 09/16/2021    Years since quitting: 0.6   Smokeless tobacco: Never   Tobacco comments:    12/25/18  quit 2 months ago  Vaping Use   Vaping Use: Never used  Substance and Sexual Activity   Alcohol use: Yes    Alcohol/week: 0.0 standard drinks of alcohol    Comment: once maybe a month if that   Drug use: No   Sexual activity: Yes    Partners: Male    Birth control/protection: Condom  Other Topics Concern   Not on file  Social History Narrative   [name] lives at home with [name] family.   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    Social Determinants of Health   Financial Resource Strain: High Risk (03/17/2018)   Overall Financial Resource Strain (CARDIA)    Difficulty of Paying Living Expenses: Hard  Food Insecurity: Food Insecurity Present (03/17/2018)   Hunger Vital Sign    Worried About Running Out of Food in Jodi Last Year: Sometimes true    Ran Out of Food in Jodi Last Year: Never true  Transportation Needs: No Transportation Needs (03/17/2018)   PRAPARE - Hydrologist (Medical): No    Lack of Transportation (Non-Medical): No  Physical Activity: Inactive (03/17/2018)   Exercise Vital Sign    Days of Exercise per Week: 0 days    Minutes of Exercise per Session: 0 min  Stress: Stress  Concern Present (03/17/2018)   [place]    Feeling of Stress : Rather much  Social Connections: Somewhat Isolated (03/17/2018)   Social Connection and Isolation Panel [NHANES]    Frequency of Communication with Friends and Family: More than three times a week    Frequency of Social Gatherings with Friends and Family: More than three times a week    Attends Religious Services: More than 4 times per year    Active Member of [employer] or Organizations: No    Attends Music therapist: Never    Marital Status: Divorced    [name] Allergies Are:  Allergies  Allergen Reactions   Iron Shortness Of Breath    IV only    Tysabri [Natalizumab] Other (See Comments)    Shortness of breath, joint pain, tremors   Delsym [Dextromethorphan Polistirex Er] Other (See Comments)    nightmares  :   [name] Current Medications Are:  Outpatient Encounter Medications as of 04/28/2022  Medication Sig   Ocrelizumab (OCREVUS IV) Inject into Jodi vein See admin instructions. Every 6 months   pantoprazole (PROTONIX) 40 MG tablet Take 1 tablet by mouth daily.   valACYclovir (VALTREX) 1000 MG tablet Take one tablet by mouth 2 times daily. Start with first symptom of outbreak. ([name] taking differently: as needed.)   Accu-Chek FastClix Lancets MISC Use to check blood sugar 2 times a day   amLODipine (NORVASC) 5 MG tablet Take one tablet (5 mg dose) by mouth daily.   atorvastatin (LIPITOR) 10 MG tablet Take one tablet (10 mg dose) by mouth daily.   Blood Glucose Monitoring Suppl (ACCU-CHEK GUIDE) w/Device KIT Use  to test blood sugar 2 times a day   cholecalciferol (VITAMIN D3) 25 MCG (1000 UNIT) tablet Take 1,000 Units by mouth daily.   glucose blood test strip Use to check blood sugar 2 times a day   ondansetron (ZOFRAN-ODT) 4 MG disintegrating tablet Allow 1 tablet to dissolve by mouth every 6  hours as needed for nausea or vomiting.   oxyCODONE  (OXY IR/ROXICODONE) 5 MG immediate release tablet Take 1 tablet by mouth every 6 hours as needed for severe pain.   telmisartan-hydrochlorothiazide (MICARDIS HCT) 80-25 MG tablet Take one tablet by mouth daily.   valACYclovir (VALTREX) 1000 MG tablet Take one tablet (1,000 mg dose) by mouth 2 (two) times daily. Start with first symptom of outbreak.   No facility-administered encounter medications on file as of 04/28/2022.  :  Review of Systems:  Out of a complete 14 point review of systems, all are reviewed and negative with Jodi exception of these symptoms as listed below:   Review of Systems  Neurological:        Pt is here for CPAP follow up Pt states [name] has no questions or concerns for todays visit     Objective:  Neurological Exam  Physical Exam Physical Examination:   Vitals:   04/28/22 1110  BP: 128/80  Pulse: 63    General Examination: [name] is a very pleasant 51 y.o. female in no acute distress. [name] appears well-developed and well-nourished and well groomed.   HEENT: Normocephalic, atraumatic, pupils are equal, round and reactive to light, extraocular tracking is good without limitation to gaze excursion or nystagmus noted. Hearing is grossly intact. Face is symmetric with normal facial animation. Speech is clear with no dysarthria noted. There is no hypophonia. There is no lip, neck/head, jaw or voice tremor. Neck is supple with full range of passive and active motion. There are no carotid bruits on auscultation. Oropharynx exam reveals: mild mouth dryness, good dental hygiene and moderate airway crowding. Tongue protrudes centrally and palate elevates symmetrically.   Chest: Clear to auscultation without wheezing, rhonchi or crackles noted.   Heart: S1+S2+0, regular and normal without murmurs, rubs or gallops noted.    Abdomen: Soft, non-tender and non-distended.   Extremities: There is no obvious edema.     Skin: Warm and dry without trophic changes noted.     Musculoskeletal: exam reveals no obvious joint deformities.    Neurologically:  Mental status: [name] is awake, alert and oriented in all 4 spheres. [name] immediate and remote memory, attention, language skills and fund of knowledge are appropriate. There is no evidence of aphasia, agnosia, apraxia or anomia. Speech is clear with normal prosody and enunciation. Thought process is linear. Mood is normal and affect is normal.  Cranial nerves II - XII are as described above under HEENT exam.  Motor exam: Normal bulk, strength and tone is noted Jodi exception of mild left leg weakness.  [name] walks with a single-point cane.  Fine motor skills are grossly intact.  No obvious cerebellar signs on exam. Sensory exam: intact to light touch in Jodi upper and lower extremities.  Gait, station and balance: [name] walks slowly.     Assessment and Plan:  In summary, [name] is a very pleasant 51 year old female with an underlying medical history of sickle cell trait, anemia, allergies, anxiety, depression, hypertension, migraine headaches, MS (followed by my colleague, Dr. Leta Baptist), prediabetes and morbid obesity with a BMI of over 40, with status post weight loss  surgery in January 2023, who presents for follow-up consultation of [name] obstructive sleep apnea.  [name] had a baseline sleep study in March 2023 and established treatment with a new AutoPap machine in late March 2023.  [name] is fully compliant with treatment.  [name] has had ongoing weight loss and is followed regularly by [name] nutritionist.  [name] is commended for [name] treatment adherence and congratulated on [name] weight loss success.  [name] is advised to continue with AutoPap at Jodi current settings.  [name] AHI is at goal.  [name] is advised to follow-up routinely in this clinic to see one of our nurse practitioners in 1 year.  We can consider doing a home sleep test at that time if [name] continues to lose weight and would like to have [name] sleep apnea reassessed at Jodi  time.  I answered all [name] questions today and [name] was in agreement with our plan.   I spent 30 minutes in total face-to-face time and in reviewing records during pre-charting, more than 50% of which was spent in counseling and coordination of care, reviewing test results, reviewing medications and treatment regimen and/or in discussing or reviewing Jodi diagnosis of OSA, Jodi prognosis and treatment options. Pertinent laboratory and imaging test results that were available during this visit with [name] were reviewed by me and considered in my medical decision making (see chart for details).

## 2022-04-28 NOTE — Patient Instructions (Signed)
It was nice to see you again today.  You are fully compliant with your AutoPap, keep up the good work!  I am glad to hear that you have been able to continue to lose weight. We can see you in 1 year, you can see one of our nurse practitioners as you are stable.  We can consider reassessing your sleep apnea at the time, if you wish, with a home sleep test.

## 2022-06-15 ENCOUNTER — Other Ambulatory Visit: Payer: Self-pay | Admitting: Physician Assistant

## 2022-06-15 DIAGNOSIS — Z1231 Encounter for screening mammogram for malignant neoplasm of breast: Secondary | ICD-10-CM

## 2022-06-17 ENCOUNTER — Other Ambulatory Visit (HOSPITAL_COMMUNITY): Payer: Self-pay

## 2022-06-17 MED ORDER — TIZANIDINE HCL 4 MG PO TABS
ORAL_TABLET | ORAL | 1 refills | Status: DC
  Filled 2022-06-17: qty 90, 30d supply, fill #0

## 2022-06-21 ENCOUNTER — Ambulatory Visit
Admission: RE | Admit: 2022-06-21 | Discharge: 2022-06-21 | Disposition: A | Discharge status: Home / Self Care | Payer: Medicare Other | Source: Ambulatory Visit | Attending: Physician Assistant | Admitting: Physician Assistant

## 2022-06-21 DIAGNOSIS — Z1231 Encounter for screening mammogram for malignant neoplasm of breast: Secondary | ICD-10-CM

## 2022-06-22 ENCOUNTER — Other Ambulatory Visit: Payer: Self-pay | Admitting: Physician Assistant

## 2022-06-22 DIAGNOSIS — R928 Other abnormal and inconclusive findings on diagnostic imaging of breast: Secondary | ICD-10-CM

## 2022-06-23 ENCOUNTER — Other Ambulatory Visit (HOSPITAL_COMMUNITY): Payer: Self-pay

## 2022-06-23 ENCOUNTER — Ambulatory Visit
Admission: EM | Admit: 2022-06-23 | Discharge: 2022-06-23 | Disposition: A | Discharge status: Home / Self Care | Payer: Medicare Other | Attending: Family Medicine | Admitting: Family Medicine

## 2022-06-23 ENCOUNTER — Encounter: Payer: Self-pay | Admitting: Emergency Medicine

## 2022-06-23 DIAGNOSIS — K0889 Other specified disorders of teeth and supporting structures: Secondary | ICD-10-CM

## 2022-06-23 MED ORDER — LIDOCAINE 4 % EX GEL: CUTANEOUS | 0 refills | Status: DC

## 2022-06-23 MED ORDER — ACETAMINOPHEN-CODEINE 300-30 MG PO TABS: 1.0000 | ORAL_TABLET | Freq: Four times a day (QID) | ORAL | 0 refills | Status: DC | PRN

## 2022-06-23 MED ORDER — KETOROLAC TROMETHAMINE 30 MG/ML IJ SOLN
30.0000 mg | Freq: Once | INTRAMUSCULAR | Status: AC
  Administered 2022-06-23: 30 mg via INTRAMUSCULAR

## 2022-06-23 MED ORDER — CLINDAMYCIN HCL 300 MG PO CAPS
ORAL_CAPSULE | ORAL | 0 refills | Status: DC
  Filled 2022-06-23: qty 28, 7d supply, fill #0

## 2022-06-23 NOTE — ED Provider Notes (Signed)
[facility]    CSN: [id number] Arrival date & time: 06/23/22  1758      History   Chief Complaint Chief Complaint  Patient presents with   Dental Pain   Hypertension   Fever    HPI [name] is a 51 y.o. female.    Dental Pain Associated symptoms: fever   Hypertension  Fever  Here for pain in her lower right teeth.  She did see the dentist today and he prescribed clindamycin.  Later she awoke from a nap and found her blood pressure to be elevated and was concerned that the medicine had caused that.  She is no longer on any blood pressure medication due to the fact that she had lost some weight.  She maybe had some fever earlier this afternoon.  No vomiting  She does have a history of MS.  Also she has had a gastric sleeve surgery and cannot take oral NSAIDs  She has tried some older hydrocodone and oxycodone that she had at home and it did not help. 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   Colon cancer screening 12/03/2019   Constipation 12/03/2019   Microcytic anemia 12/03/2019   Heartburn 12/03/2019   High risk medication use 06/12/2018   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   Other fatigue 08/05/2016   Migraine without aura and without status migrainosus, not intractable 08/05/2016   Prediabetes    MS (multiple sclerosis) ([place])    Obesity, Class III, BMI 40-49.9 (morbid obesity) ([place]) 10/16/2014   Smoker 10/16/2014   OSA on CPAP 10/16/2014   HTN (hypertension) 01/14/2012   Sickle cell trait ([place]) 01/14/2012    Past Surgical  History:  Procedure Laterality Date   CERVICAL ABLATION     CESAREAN SECTION     3 times   CHOLECYSTECTOMY  1990   HIATAL HERNIA REPAIR N/A 12/13/2021   Procedure: HERNIA REPAIR HIATAL;  Surgeon: [name], MD;  Location: [facility];  Service: General;  Laterality: N/A;   LASER ABLATION OF THE CERVIX     TUBAL LIGATION  1995   UPPER GI ENDOSCOPY N/A 12/13/2021   Procedure: UPPER GI ENDOSCOPY;  Surgeon: [name], MD;  Location: [facility];  Service: General;  Laterality: N/A;    OB History   No obstetric history on file.      Home Medications    Prior to Admission medications   Medication Sig Start Date End Date Taking? Authorizing Provider  acetaminophen-codeine (TYLENOL #3) 300-30 MG tablet Take 1-2 tablets by mouth every 6 (six) hours as needed for moderate pain. 06/23/22  Yes Barrett Henle, MD  Accu-Chek FastClix Lancets MISC Use to check blood sugar 2 times a day 08/20/21     amLODipine (NORVASC) 5 MG tablet Take one tablet (5 mg dose) by mouth daily. 07/01/21     atorvastatin (LIPITOR) 10 MG tablet Take one tablet (10 mg dose) by mouth daily. 07/09/21     Blood  Glucose Monitoring Suppl (ACCU-CHEK GUIDE) w/Device KIT Use to test blood sugar 2 times a day 08/20/21     cholecalciferol (VITAMIN D3) 25 MCG (1000 UNIT) tablet Take 1,000 Units by mouth daily.    [provider]  clindamycin (CLEOCIN) 300 MG capsule Take 1 capsule by mouth 4 times a day for 7 days 06/23/22     glucose blood test strip Use to check blood sugar 2 times a day 08/20/21     Lidocaine 4 % GEL Apply a small amount topically to the painful area in your mouth every 4 hours as needed for pain 06/23/22   Barrett Henle, MD  Ocrelizumab (OCREVUS IV) Inject into the vein See admin instructions. Every 6 months    [provider]  ondansetron (ZOFRAN-ODT) 4 MG disintegrating tablet Allow 1 tablet to dissolve by mouth every 6  hours as needed for nausea or vomiting. 12/14/21   [name],  MD  oxyCODONE (OXY IR/ROXICODONE) 5 MG immediate release tablet Take 1 tablet by mouth every 6 hours as needed for severe pain. 12/14/21   [name], MD  pantoprazole (PROTONIX) 40 MG tablet Take 1 tablet by mouth daily. 03/18/22     telmisartan-hydrochlorothiazide (MICARDIS HCT) 80-25 MG tablet Take one tablet by mouth daily. 07/01/21     tiZANidine ([name]) 4 MG tablet Take one tablet (4 mg dose) by mouth 3 (three) times a day. 06/17/22     valACYclovir (VALTREX) 1000 MG tablet Take one tablet by mouth 2 times daily. Start with first symptom of outbreak. Patient taking differently: as needed. 07/01/21     valACYclovir (VALTREX) 1000 MG tablet Take one tablet (1,000 mg dose) by mouth 2 (two) times daily. Start with first symptom of outbreak. 04/07/22       Family History Family History  Problem Relation Age of Onset   Mental illness Mother    Hyperlipidemia Mother    Hyperthyroidism Mother    Mental retardation Mother    Other Mother        Covid   Diabetes Father    Cancer Father    Cancer Paternal Aunt        breast   Cancer Paternal Aunt        breast   Arthritis Maternal Grandmother    Diabetes Maternal Grandmother    Hearing loss Maternal Grandmother        left hear when she was a child   Hyperlipidemia Maternal Grandmother    Hypertension Maternal Grandmother    Alcohol abuse Maternal Grandfather    Early death Maternal Grandfather    ADD / ADHD Son    Multiple sclerosis Cousin    Breast cancer Neg Hx    Colon cancer Neg Hx    Esophageal cancer Neg Hx    Inflammatory bowel disease Neg Hx    Liver disease Neg Hx    Pancreatic cancer Neg Hx    Stomach cancer Neg Hx    Rectal cancer Neg Hx    Sleep apnea Neg Hx     Social History Social History   Tobacco Use   Smoking status: Former    Packs/day: 0.50    Years: 25.00    Total pack years: 12.50    Types: Cigarettes    Quit date: 09/16/2021    Years since quitting: 0.7   Smokeless tobacco: Never   Tobacco  comments:    12/25/18  quit 2 months ago  Vaping Use   Vaping Use: Never  used  Substance Use Topics   Alcohol use: Yes    Alcohol/week: 0.0 standard drinks of alcohol    Comment: once maybe a month if that   Drug use: No     Allergies   Iron, Tysabri [natalizumab], and Delsym [dextromethorphan polistirex er]   Review of Systems Review of Systems  Constitutional:  Positive for fever.     Physical Exam Triage Vital Signs ED Triage Vitals  Enc Vitals Group     BP 06/23/22 1808 (!) 166/109     Pulse Rate 06/23/22 1808 97     Resp 06/23/22 1808 20     Temp 06/23/22 1808 99.3 F (37.4 C)     Temp src --      SpO2 06/23/22 1808 98 %     Weight --      Height --      Head Circumference --      Peak Flow --      Pain Score 06/23/22 1811 8     Pain Loc --      Pain Edu? --      Excl. in [place]? --    No data found.  Updated Vital Signs BP (!) 166/109   Pulse 97   Temp 99.3 F (37.4 C)   Resp 20   SpO2 98%   Visual Acuity Right Eye Distance:   Left Eye Distance:   Bilateral Distance:    Right Eye Near:   Left Eye Near:    Bilateral Near:     Physical Exam Vitals reviewed.  Constitutional:      Appearance: She is not ill-appearing, toxic-appearing or diaphoretic.     Comments: She is in some obvious pain  HENT:     Mouth/Throat:     Mouth: Mucous membranes are moist.     Comments: No abscess visible Eyes:     Extraocular Movements: Extraocular movements intact.     Pupils: Pupils are equal, round, and reactive to light.  Cardiovascular:     Rate and Rhythm: Normal rate and regular rhythm.     Heart sounds: No murmur heard. Pulmonary:     Effort: Pulmonary effort is normal.     Breath sounds: Normal breath sounds.  Musculoskeletal:     Cervical back: Neck supple.  Lymphadenopathy:     Cervical: No cervical adenopathy.  Skin:    Coloration: Skin is not jaundiced or pale.  Neurological:     General: No focal deficit present.     Mental Status: She is  alert and oriented to person, place, and time.  Psychiatric:        Behavior: Behavior normal.      UC Treatments / Results  Labs (all labs ordered are listed, but only abnormal results are displayed) Labs Reviewed - No data to display  EKG   Radiology No results found.  Procedures Procedures (including critical care time)  Medications Ordered in UC Medications  ketorolac (TORADOL) 30 MG/ML injection 30 mg (has no administration in time range)    Initial Impression / Assessment and Plan / UC Course  I have reviewed the triage vital signs and the nursing notes.  Pertinent labs & imaging results that were available during my care of the patient were reviewed by me and considered in my medical decision making (see chart for details).     We will provide a Toradol injection here and I am going to send Tylenol with codeine in case that will help any better.  Her last fill of anything narcotic was about 6 months ago.  Also I am going to send and print a prescription for lidocaine gel in case that will help.  Supplies have been hard to find though at the pharmacies recently Final Clinical Impressions(s) / UC Diagnoses   Final diagnoses:  Pain, dental     Discharge Instructions      You have been given a shot of Toradol 30 mg today.  Tylenol with codeine--1 to 2 tablets by mouth every 6 hours as needed for pain.  Take with something on your stomach.  I have sent a prescription for lidocaine gel and printed it also.  Supplies have been hard to find in pharmacies recently.     ED Prescriptions     Medication Sig Dispense Auth. Provider   acetaminophen-codeine (TYLENOL #3) 300-30 MG tablet Take 1-2 tablets by mouth every 6 (six) hours as needed for moderate pain. 15 tablet Kevaughn Ewing, Gwenlyn Perking, MD   Lidocaine 4 % GEL  (Status: Discontinued) Apply a small amount topically to the painful area in your mouth every 4 hours as needed for pain 15 g Barrett Henle, MD    Lidocaine 4 % GEL Apply a small amount topically to the painful area in your mouth every 4 hours as needed for pain 15 g Windy Carina, Gwenlyn Perking, MD      I have reviewed the PDMP during this encounter.   Barrett Henle, MD 06/23/22 [phone]

## 2022-06-23 NOTE — Discharge Instructions (Addendum)
You have been given a shot of Toradol 30 mg today.  Tylenol with codeine--1 to 2 tablets by mouth every 6 hours as needed for pain.  Take with something on your stomach.  I have sent a prescription for lidocaine gel and printed it also.  Supplies have been hard to find in pharmacies recently.  Please take your clindamycin prescription as it has been prescribed.  I do not think it has caused your elevated blood pressure

## 2022-06-23 NOTE — ED Triage Notes (Signed)
Pt here with lower right side dental pain x 5 days. Pt was given Clindamycin today for dental infection and then noticed her BP was high when normally it is not.

## 2022-07-04 ENCOUNTER — Ambulatory Visit
Admission: EM | Admit: 2022-07-04 | Discharge: 2022-07-04 | Disposition: A | Discharge status: Home / Self Care | Payer: Medicare Other | Attending: Internal Medicine | Admitting: Internal Medicine

## 2022-07-04 DIAGNOSIS — K0889 Other specified disorders of teeth and supporting structures: Secondary | ICD-10-CM

## 2022-07-04 MED ORDER — HYDROCODONE-ACETAMINOPHEN 5-325 MG PO TABS: 1.0000 | ORAL_TABLET | Freq: Four times a day (QID) | ORAL | 0 refills | Status: DC | PRN

## 2022-07-04 NOTE — Discharge Instructions (Signed)
You have been sent pain medication.  Please follow-up with dentist tomorrow to have prescription fixed.

## 2022-07-04 NOTE — ED Triage Notes (Signed)
Pt presents to [facility] with co of dental surgery today, pt doctor wrote prescription on regular paper and pt is unable to fill tonight. Pt requesting pain medicine for tonight.

## 2022-07-04 NOTE — ED Provider Notes (Signed)
[facility]    CSN: [id number] Arrival date & time: 07/04/22  1836      History   Chief Complaint Chief Complaint  Patient presents with   Dental Injury    HPI [name] is a 51 y.o. female.   Patient presents with right lower dental pain after having a tooth pulled at approximately 5 to 6 PM today by dentist.  Patient received an order for Norco 5-325 mg but reports that she went to the pharmacy and they did not fill it given that is not on prescription paper.  Pharmacy was called to confirm this by nursing staff.  Patient reports that she needs some pain medication to get her through the night.  She is not able to take ibuprofen given history of gastric surgery.  Patient was given Tylenol with codeine a few weeks prior but reports that she does not have any more of this medication left.   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   Colon cancer screening 12/03/2019   Constipation 12/03/2019   Microcytic anemia 12/03/2019   Heartburn 12/03/2019   High risk medication use 06/12/2018   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   Other fatigue 08/05/2016   Migraine without aura and without status migrainosus, not intractable 08/05/2016   Prediabetes    MS (multiple sclerosis) ([place])    Obesity, Class III, BMI 40-49.9 (morbid obesity) ([place]) 10/16/2014   Smoker 10/16/2014   OSA on CPAP 10/16/2014   HTN (hypertension) 01/14/2012   Sickle cell trait ([place]) 01/14/2012    Past Surgical History:  Procedure Laterality Date   CERVICAL  ABLATION     CESAREAN SECTION     3 times   CHOLECYSTECTOMY  1990   HIATAL HERNIA REPAIR N/A 12/13/2021   Procedure: HERNIA REPAIR HIATAL;  Surgeon: [name], MD;  Location: [facility];  Service: General;  Laterality: N/A;   LASER ABLATION OF THE CERVIX     TUBAL LIGATION  1995   UPPER GI ENDOSCOPY N/A 12/13/2021   Procedure: UPPER GI ENDOSCOPY;  Surgeon: [name], MD;  Location: [facility];  Service: General;  Laterality: N/A;    OB History   No obstetric history on file.      Home Medications    Prior to Admission medications   Medication Sig Start Date End Date Taking? Authorizing Provider  HYDROcodone-acetaminophen (NORCO/VICODIN) 5-325 MG tablet Take 1 tablet by mouth every 6 (six) hours as needed for severe pain. 07/04/22  Yes , Michele Rockers, FNP  Accu-Chek FastClix Lancets MISC Use to check blood sugar 2 times a day 08/20/21     amLODipine (NORVASC) 5 MG tablet Take one tablet (5 mg dose) by mouth daily. 07/01/21     atorvastatin (LIPITOR) 10 MG tablet Take one tablet (10 mg dose) by mouth daily. 07/09/21     Blood Glucose Monitoring Suppl (ACCU-CHEK GUIDE) w/Device KIT Use to test blood sugar 2 times a  day 08/20/21     cholecalciferol (VITAMIN D3) 25 MCG (1000 UNIT) tablet Take 1,000 Units by mouth daily.    [provider]  clindamycin (CLEOCIN) 300 MG capsule Take 1 capsule by mouth 4 times a day for 7 days 06/23/22     glucose blood test strip Use to check blood sugar 2 times a day 08/20/21     Lidocaine 4 % GEL Apply a small amount topically to the painful area in your mouth every 4 hours as needed for pain 06/23/22   Barrett Henle, MD  Ocrelizumab (OCREVUS IV) Inject into the vein See admin instructions. Every 6 months    [provider]  ondansetron (ZOFRAN-ODT) 4 MG disintegrating tablet Allow 1 tablet to dissolve by mouth every 6  hours as needed for nausea or vomiting. 12/14/21   [name], MD  pantoprazole (PROTONIX) 40 MG tablet Take 1  tablet by mouth daily. 03/18/22     telmisartan-hydrochlorothiazide (MICARDIS HCT) 80-25 MG tablet Take one tablet by mouth daily. 07/01/21     tiZANidine ([name]) 4 MG tablet Take one tablet (4 mg dose) by mouth 3 (three) times a day. 06/17/22     valACYclovir (VALTREX) 1000 MG tablet Take one tablet by mouth 2 times daily. Start with first symptom of outbreak. Patient taking differently: as needed. 07/01/21     valACYclovir (VALTREX) 1000 MG tablet Take one tablet (1,000 mg dose) by mouth 2 (two) times daily. Start with first symptom of outbreak. 04/07/22       Family History Family History  Problem Relation Age of Onset   Mental illness Mother    Hyperlipidemia Mother    Hyperthyroidism Mother    Mental retardation Mother    Other Mother        Covid   Diabetes Father    Cancer Father    Cancer Paternal Aunt        breast   Cancer Paternal Aunt        breast   Arthritis Maternal Grandmother    Diabetes Maternal Grandmother    Hearing loss Maternal Grandmother        left hear when she was a child   Hyperlipidemia Maternal Grandmother    Hypertension Maternal Grandmother    Alcohol abuse Maternal Grandfather    Early death Maternal Grandfather    ADD / ADHD Son    Multiple sclerosis Cousin    Breast cancer Neg Hx    Colon cancer Neg Hx    Esophageal cancer Neg Hx    Inflammatory bowel disease Neg Hx    Liver disease Neg Hx    Pancreatic cancer Neg Hx    Stomach cancer Neg Hx    Rectal cancer Neg Hx    Sleep apnea Neg Hx     Social History Social History   Tobacco Use   Smoking status: Former    Packs/day: 0.50    Years: 25.00    Total pack years: 12.50    Types: Cigarettes    Quit date: 09/16/2021    Years since quitting: 0.7   Smokeless tobacco: Never   Tobacco comments:    12/25/18  quit 2 months ago  Vaping Use   Vaping Use: Never used  Substance Use Topics   Alcohol use: Yes    Alcohol/week: 0.0 standard drinks of alcohol    Comment: once maybe a month if  that   Drug use: No     Allergies   Iron, Tysabri [  natalizumab], and Delsym [dextromethorphan polistirex er]   Review of Systems Review of Systems Per HPI  Physical Exam Triage Vital Signs ED Triage Vitals [07/04/22 1949]  Enc Vitals Group     BP (!) 144/82     Pulse Rate 88     Resp 16     Temp 98.4 F (36.9 C)     Temp Source Oral     SpO2 95 %     Weight      Height      Head Circumference      Peak Flow      Pain Score      Pain Loc      Pain Edu?      Excl. in [place]?    No data found.  Updated Vital Signs BP (!) 144/82 (BP Location: Left Arm)   Pulse 88   Temp 98.4 F (36.9 C) (Oral)   Resp 16   SpO2 95%   Visual Acuity Right Eye Distance:   Left Eye Distance:   Bilateral Distance:    Right Eye Near:   Left Eye Near:    Bilateral Near:     Physical Exam Constitutional:      General: She is not in acute distress.    Appearance: Normal appearance. She is not toxic-appearing or diaphoretic.  HENT:     Head: Normocephalic and atraumatic.     Mouth/Throat:      Comments: Gauze with bleeding controlled overlying right back lower teeth. Eyes:     Extraocular Movements: Extraocular movements intact.     Conjunctiva/sclera: Conjunctivae normal.  Pulmonary:     Effort: Pulmonary effort is normal.  Neurological:     General: No focal deficit present.     Mental Status: She is alert and oriented to person, place, and time. Mental status is at baseline.  Psychiatric:        Mood and Affect: Mood normal.        Behavior: Behavior normal.        Thought Content: Thought content normal.        Judgment: Judgment normal.      UC Treatments / Results  Labs (all labs ordered are listed, but only abnormal results are displayed) Labs Reviewed - No data to display  EKG   Radiology No results found.  Procedures Procedures (including critical care time)  Medications Ordered in UC Medications - No data to display  Initial Impression / Assessment  and Plan / UC Course  I have reviewed the triage vital signs and the nursing notes.  Pertinent labs & imaging results that were available during my care of the patient were reviewed by me and considered in my medical decision making (see chart for details).     Bleeding is controlled.  No current complications from dental procedure.  Patient requesting pain medication.  Limited options on pain management given patient cannot take NSAIDs.  Advised patient that I am not able to completely refill order to ensure that 2 narcotic prescriptions are not filled.  Will send patient 3 Norco pills to help alleviate pain throughout the night.  Patient was encouraged to follow-up with dentist tomorrow to have prescription that they provided fixed so that pharmacy will fill it.  Patient was advised that Norco can cause drowsiness and do not drive or drink alcohol with taking it.  Patient does not have any Tylenol with codeine left so this should be safe.  PDMP also reviewed.  Discussed strict  return precautions.  Patient verbalized understanding and was agreeable with plan. Final Clinical Impressions(s) / UC Diagnoses   Final diagnoses:  Pain, dental     Discharge Instructions      You have been sent pain medication.  Please follow-up with dentist tomorrow to have prescription fixed.     ED Prescriptions     Medication Sig Dispense Auth. Provider   HYDROcodone-acetaminophen (NORCO/VICODIN) 5-325 MG tablet Take 1 tablet by mouth every 6 (six) hours as needed for severe pain. 3 tablet [place], [place], [place]      I have reviewed the PDMP during this encounter.   Teodora Medici, [place] 07/04/22 2014

## 2022-07-05 ENCOUNTER — Other Ambulatory Visit: Payer: Self-pay

## 2022-07-05 DIAGNOSIS — G35 Multiple sclerosis: Secondary | ICD-10-CM

## 2022-07-05 DIAGNOSIS — Z79899 Other long term (current) drug therapy: Secondary | ICD-10-CM

## 2022-07-06 ENCOUNTER — Ambulatory Visit
Admission: RE | Admit: 2022-07-06 | Discharge: 2022-07-06 | Disposition: A | Discharge status: Home / Self Care | Payer: Medicare Other | Source: Ambulatory Visit | Attending: Physician Assistant | Admitting: Physician Assistant

## 2022-07-06 ENCOUNTER — Other Ambulatory Visit (INDEPENDENT_AMBULATORY_CARE_PROVIDER_SITE_OTHER): Payer: Self-pay

## 2022-07-06 ENCOUNTER — Ambulatory Visit: Payer: Medicare Other

## 2022-07-06 DIAGNOSIS — Z79899 Other long term (current) drug therapy: Secondary | ICD-10-CM

## 2022-07-06 DIAGNOSIS — Z0289 Encounter for other administrative examinations: Secondary | ICD-10-CM

## 2022-07-06 DIAGNOSIS — R928 Other abnormal and inconclusive findings on diagnostic imaging of breast: Secondary | ICD-10-CM

## 2022-07-06 DIAGNOSIS — G35 Multiple sclerosis: Secondary | ICD-10-CM

## 2022-07-08 LAB — IMMUNOGLOBULINS A/E/G/M, SERUM
IgA/Immunoglobulin A, Serum: 273 mg/dL (ref 87–352)
IgE (Immunoglobulin E), Serum: 13 IU/mL (ref 6–495)
IgG (Immunoglobin G), Serum: 944 mg/dL (ref 586–1602)
IgM (Immunoglobulin M), Srm: 32 mg/dL (ref 26–217)

## 2022-07-11 ENCOUNTER — Telehealth: Payer: Self-pay | Admitting: *Deleted

## 2022-07-11 NOTE — Telephone Encounter (Signed)
Per Judeth Cornfield, RN, infusion suite patient needs new Ocrevus orders. Her next infusion is 08/04/22. Orders placed on Dr [employer] desk for completion, signature.

## 2022-07-13 NOTE — Telephone Encounter (Signed)
Ocrevus orders completed, signed and given to Julianne Rice, RN, infusion suite.

## 2022-07-28 ENCOUNTER — Encounter: Payer: Medicare Other | Attending: Surgery | Admitting: Skilled Nursing Facility1

## 2022-07-28 DIAGNOSIS — I10 Essential (primary) hypertension: Secondary | ICD-10-CM | POA: Insufficient documentation

## 2022-07-28 DIAGNOSIS — E669 Obesity, unspecified: Secondary | ICD-10-CM | POA: Insufficient documentation

## 2022-07-28 DIAGNOSIS — Z9884 Bariatric surgery status: Secondary | ICD-10-CM | POA: Diagnosis not present

## 2022-07-28 DIAGNOSIS — Z713 Dietary counseling and surveillance: Secondary | ICD-10-CM | POA: Diagnosis not present

## 2022-07-28 DIAGNOSIS — G35 Multiple sclerosis: Secondary | ICD-10-CM | POA: Diagnosis not present

## 2022-07-28 DIAGNOSIS — E119 Type 2 diabetes mellitus without complications: Secondary | ICD-10-CM | POA: Diagnosis not present

## 2022-07-28 NOTE — Progress Notes (Signed)
Follow-up visit:  Post-Operative sleeve Surgery    Primary concerns today: Post-operative Bariatric Surgery Nutrition Management 6 Month Post-Op Class  Surgery date: 12/13/2021 Surgery type: sleeve Start weight at [facility]: 307 pounds Weight: 217.5 pounds   Body Composition Scale 12/29/2021 02/07/2022 04/28/2022 07/28/2022  Current Body Weight 280.2 265.2 243 217.5  Total Body Fat % 48.7 48.3 46.6 42.8  Visceral Fat '21 20 18 14  '$ Fat-Free Mass % 51.2 51.6 53.3 57.1   Total Body Water % 40.1 40.3 41.1 43  Muscle-Mass lbs 30.9 29.8 29.3 30.2  BMI 48.6 47.8 44.3 37.6  Body Fat Displacement             Torso  lbs 84.8 79.4 70.2 57.7         Left Leg  lbs 16.9 15.8 14 11.5         Right Leg  lbs 16.9 15.8 14 11.5         Left Arm  lbs 8.4 7.9 7 5.7         Right Arm   lbs 8.4 7.9 7 5.7   Clinical  Medical hx: DM, MS Medications: blood pressure  Labs: A1C 6.5; CO2 18, calcium 11.4, AST 54, ALT 62 Notable signs/symptoms: MS pain, back and sciatica pain Any previous deficiencies? No   34 hr recall: Breakfast: deli [place] + protein shake  Snack: Lunch: grilled chicken sandwich + one bun or chic fila grilled nuggets Snack: Dinner: pork chop or steak or salmon + green beans or chicken salad from [employer] (2 times a week)   Fluid intake: water, water + flavoring; 64-90 ounces  Medications: See List Supplementation: multi and calcium and vitamin D and biotin    Using straws: no Drinking while eating: no Having you been chewing well: yes Chewing/swallowing difficulties: no Changes in vision: no Changes to mood/headaches: no Hair loss/[place] to skin/Changes to nails: no Any difficulty focusing or concentrating: no Sweating: no Dizziness/Lightheaded: no Palpitations: no  Carbonated beverages: no N/V/D/C/GAS: no Abdominal Pain: no Dumping syndrome: no  Recent physical activity:  walking and stationary bike 7 days a week: 8 minutes on the bike, walking 20-60 minutes; light  weights a couple times a week  Progress Towards Goal(s):  In Progress Teaching method utilized: Environmental health practitioner & Auditory  Demonstrated degree of understanding via: Teach Back  Readiness Level: Action Barriers to learning/adherence to lifestyle change: none identified  Goals: Choose complex carbohydrates over simple carbohydrates   Teaching Method Utilized:  Visual Auditory Hands on  Demonstrated degree of understanding via:  Teach Back   Monitoring/Evaluation:  Dietary intake, exercise, and body weight.

## 2022-08-10 ENCOUNTER — Telehealth: Payer: Self-pay | Admitting: Skilled Nursing Facility1

## 2022-08-10 NOTE — Telephone Encounter (Signed)
Pt called stating the labels have been confusing her.   Dietitian went through the nutrition facts label with her.

## 2022-10-13 ENCOUNTER — Other Ambulatory Visit (HOSPITAL_COMMUNITY): Payer: Self-pay

## 2022-10-13 MED ORDER — METRONIDAZOLE 0.75 % VA GEL
VAGINAL | 0 refills | Status: DC
  Filled 2022-10-13: qty 70, 5d supply, fill #0

## 2022-10-14 ENCOUNTER — Other Ambulatory Visit (HOSPITAL_COMMUNITY): Payer: Self-pay

## 2022-11-04 ENCOUNTER — Encounter: Payer: Self-pay | Admitting: Physician Assistant

## 2022-11-04 ENCOUNTER — Ambulatory Visit
Admission: EM | Admit: 2022-11-04 | Discharge: 2022-11-04 | Disposition: A | Discharge status: Home / Self Care | Payer: Medicare Other | Attending: Physician Assistant | Admitting: Physician Assistant

## 2022-11-04 DIAGNOSIS — Z113 Encounter for screening for infections with a predominantly sexual mode of transmission: Secondary | ICD-10-CM | POA: Diagnosis not present

## 2022-11-04 DIAGNOSIS — J029 Acute pharyngitis, unspecified: Secondary | ICD-10-CM | POA: Insufficient documentation

## 2022-11-04 NOTE — ED Triage Notes (Signed)
Pt presents to uc with co of sore throat and slight cough since Sunday. Pain is 4/10. Pt reports otc dayquill and throat lozenges.

## 2022-11-04 NOTE — ED Provider Notes (Signed)
[facility]    CSN: [id number] Arrival date & time: 11/04/22  9833      History   Chief Complaint Chief Complaint  Patient presents with   Sore Throat    HPI [name] is a 51 y.o. female.   Patient here today for evaluation of sore throat and slight cough she has had since Sunday.  She reports that her pain is a 4 out of 10.  She has not noticed any exudate or other concerning findings to her throat.  She has been taking DayQuil and throat lozenges with mild relief.  She was mostly concerned because her symptoms started after recent sexual intercourse with her partner.  She states that she did have some itching vaginally which cleared up after 1 dose of boric acid, but then developed sore throat.   The history is provided by the patient.  Sore Throat Pertinent negatives include no abdominal pain and no shortness of breath.    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   Colon cancer screening 12/03/2019   Constipation 12/03/2019   Microcytic anemia 12/03/2019   Heartburn 12/03/2019   High risk medication use 06/12/2018   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   Other fatigue 08/05/2016   Migraine without aura and without status migrainosus, not intractable 08/05/2016   Prediabetes    MS (multiple sclerosis) ([place])    Obesity, Class III, BMI 40-49.9 (morbid obesity) ([place]) 10/16/2014   Smoker 10/16/2014   OSA on CPAP 10/16/2014   HTN (hypertension) 01/14/2012   Sickle cell trait ([place]) 01/14/2012    Past Surgical History:  Procedure Laterality  Date   CERVICAL ABLATION     CESAREAN SECTION     3 times   CHOLECYSTECTOMY  1990   HIATAL HERNIA REPAIR N/A 12/13/2021   Procedure: HERNIA REPAIR HIATAL;  Surgeon: [name], MD;  Location: [facility];  Service: General;  Laterality: N/A;   LASER ABLATION OF THE CERVIX     TUBAL LIGATION  1995   UPPER GI ENDOSCOPY N/A 12/13/2021   Procedure: UPPER GI ENDOSCOPY;  Surgeon: [name], MD;  Location: [facility];  Service: General;  Laterality: N/A;    OB History   No obstetric history on file.      Home Medications    Prior to Admission medications   Medication Sig Start Date End Date Taking? Authorizing Provider  Accu-Chek FastClix Lancets MISC Use to check blood sugar 2 times a day 08/20/21     amLODipine (NORVASC) 5 MG tablet Take one tablet (5 mg dose) by mouth daily. 07/01/21     atorvastatin (LIPITOR) 10 MG tablet Take one tablet (10 mg dose) by mouth daily. 07/09/21     Blood Glucose Monitoring Suppl (ACCU-CHEK GUIDE) w/Device KIT Use to test blood sugar 2 times a day 08/20/21     cholecalciferol (VITAMIN D3) 25 MCG (1000 UNIT) tablet Take 1,000 Units by mouth daily.    [provider]  clindamycin (CLEOCIN) 300 MG capsule Take 1 capsule by mouth 4 times a day for 7 days 06/23/22     glucose blood test strip Use to check blood sugar 2 times a day 08/20/21     HYDROcodone-acetaminophen (NORCO/VICODIN) 5-325 MG tablet Take 1 tablet by mouth every 6 (six) hours as needed for severe pain. 07/04/22   Teodora Medici, FNP  Lidocaine 4 % GEL Apply a small amount topically to the painful area in your mouth every 4 hours as needed for pain 06/23/22   Barrett Henle, MD  metroNIDAZOLE (METROGEL) 0.75 % vaginal gel Insert 1 applicatorful vaginally 2 (two) times daily for 5 days. (no alcohol) 10/13/22     Ocrelizumab (OCREVUS IV) Inject into the vein See admin instructions. Every 6 months    [provider]  ondansetron (ZOFRAN-ODT) 4 MG disintegrating tablet Allow 1 tablet  to dissolve by mouth every 6  hours as needed for nausea or vomiting. 12/14/21   [name], MD  pantoprazole (PROTONIX) 40 MG tablet Take 1 tablet by mouth daily. 03/18/22     telmisartan-hydrochlorothiazide (MICARDIS HCT) 80-25 MG tablet Take one tablet by mouth daily. 07/01/21     tiZANidine ([name]) 4 MG tablet Take one tablet (4 mg dose) by mouth 3 (three) times a day. 06/17/22     valACYclovir (VALTREX) 1000 MG tablet Take one tablet by mouth 2 times daily. Start with first symptom of outbreak. Patient taking differently: as needed. 07/01/21     valACYclovir (VALTREX) 1000 MG tablet Take one tablet (1,000 mg dose) by mouth 2 (two) times daily. Start with first symptom of outbreak. 04/07/22       Family History Family History  Problem Relation Age of Onset   Mental illness Mother    Hyperlipidemia Mother    Hyperthyroidism Mother    Mental retardation Mother    Other Mother        Covid   Diabetes Father    Cancer Father    Cancer Paternal Aunt        breast   Cancer Paternal Aunt        breast   Arthritis Maternal Grandmother    Diabetes Maternal Grandmother    Hearing loss Maternal Grandmother        left hear when she was a child   Hyperlipidemia Maternal Grandmother    Hypertension Maternal Grandmother    Alcohol abuse Maternal Grandfather    Early death Maternal Grandfather    ADD / ADHD Son    Multiple sclerosis Cousin    Breast cancer Neg Hx    Colon cancer Neg Hx    Esophageal cancer Neg Hx    Inflammatory bowel disease Neg Hx    Liver disease Neg Hx    Pancreatic cancer Neg Hx    Stomach cancer Neg Hx    Rectal cancer Neg Hx    Sleep apnea Neg Hx     Social History Social History   Tobacco Use   Smoking status: Former    Packs/day: 0.50    Years: 25.00    Total pack years: 12.50    Types: Cigarettes    Quit date: 09/16/2021    Years since quitting: 1.1   Smokeless tobacco: Never   Tobacco comments:    12/25/18  quit 2 months ago  Vaping Use    Vaping Use: Never used  Substance Use Topics   Alcohol use: Yes    Alcohol/week: 0.0 standard drinks of alcohol  Comment: once maybe a month if that   Drug use: No     Allergies   Iron, Tysabri [natalizumab], and Delsym [dextromethorphan polistirex er]   Review of Systems Review of Systems  Constitutional:  Negative for chills and fever.  HENT:  Positive for sore throat. Negative for congestion and ear pain.   Eyes:  Negative for discharge and redness.  Respiratory:  Positive for cough. Negative for shortness of breath and wheezing.   Gastrointestinal:  Negative for abdominal pain, diarrhea, nausea and vomiting.     Physical Exam Triage Vital Signs ED Triage Vitals  Enc Vitals Group     BP 11/04/22 1009 (!) 145/90     Pulse Rate 11/04/22 1009 61     Resp 11/04/22 1009 16     Temp 11/04/22 1009 (!) 97.5 F (36.4 C)     Temp Source 11/04/22 1009 Oral     SpO2 11/04/22 1009 98 %     Weight --      Height --      Head Circumference --      Peak Flow --      Pain Score 11/04/22 1008 4     Pain Loc --      Pain Edu? --      Excl. in [place]? --    No data found.  Updated Vital Signs BP (!) 145/90   Pulse 61   Temp (!) 97.5 F (36.4 C) (Oral)   Resp 16   SpO2 98%      Physical Exam Vitals and nursing note reviewed.  Constitutional:      General: She is not in acute distress.    Appearance: Normal appearance. She is not ill-appearing.  HENT:     Head: Normocephalic and atraumatic.     Nose: No congestion or rhinorrhea.     Mouth/Throat:     Mouth: Mucous membranes are moist.     Pharynx: No oropharyngeal exudate or posterior oropharyngeal erythema.  Eyes:     Conjunctiva/sclera: Conjunctivae normal.  Cardiovascular:     Rate and Rhythm: Normal rate and regular rhythm.     Heart sounds: Normal heart sounds. No murmur heard. Pulmonary:     Effort: Pulmonary effort is normal. No respiratory distress.     Breath sounds: Normal breath sounds. No wheezing,  rhonchi or rales.  Skin:    General: Skin is warm and dry.  Neurological:     Mental Status: She is alert.  Psychiatric:        Mood and Affect: Mood normal.        Thought Content: Thought content normal.      UC Treatments / Results  Labs (all labs ordered are listed, but only abnormal results are displayed) Labs Reviewed  CYTOLOGY, (ORAL, ANAL, URETHRAL) ANCILLARY ONLY  CERVICOVAGINAL ANCILLARY ONLY    EKG   Radiology No results found.  Procedures Procedures (including critical care time)  Medications Ordered in UC Medications - No data to display  Initial Impression / Assessment and Plan / UC Course  I have reviewed the triage vital signs and the nursing notes.  Pertinent labs & imaging results that were available during my care of the patient were reviewed by me and considered in my medical decision making (see chart for details).    Will order STD screening at patient request- Will await results for further recommendation otherwise encouraged symptomatic treatment. Patient declines blood work for STD screening as she recently had same. Encouraged follow  up with any further concerns or questions.   Final Clinical Impressions(s) / UC Diagnoses   Final diagnoses:  Acute pharyngitis, unspecified etiology  Screening for STD (sexually transmitted disease)   Discharge Instructions   None    ED Prescriptions   None    PDMP not reviewed this encounter.   Francene Finders, PA-C 11/04/22 1156

## 2022-11-06 ENCOUNTER — Ambulatory Visit
Admission: EM | Admit: 2022-11-06 | Discharge: 2022-11-06 | Disposition: A | Discharge status: Home / Self Care | Payer: Medicare Other | Attending: Physician Assistant | Admitting: Physician Assistant

## 2022-11-06 ENCOUNTER — Telehealth: Payer: Self-pay | Admitting: Physician Assistant

## 2022-11-06 DIAGNOSIS — Z01419 Encounter for gynecological examination (general) (routine) without abnormal findings: Secondary | ICD-10-CM | POA: Diagnosis present

## 2022-11-06 NOTE — Telephone Encounter (Signed)
No order placed for oral STD screen swab- will replace order- patient to return for collection.

## 2022-11-06 NOTE — ED Triage Notes (Signed)
Pt here for cyto recollect. Oral and vaginal swabs collected, labeled, and bagged appropriately.

## 2022-11-08 LAB — CERVICOVAGINAL ANCILLARY ONLY
Bacterial Vaginitis (gardnerella): POSITIVE — AB
Bacterial Vaginitis (gardnerella): NEGATIVE
Candida Glabrata: NEGATIVE
Candida Glabrata: NEGATIVE
Candida Vaginitis: NEGATIVE
Candida Vaginitis: NEGATIVE
Chlamydia: NEGATIVE
Chlamydia: NEGATIVE
Comment: NEGATIVE
Comment: NEGATIVE
Comment: NEGATIVE
Comment: NEGATIVE
Comment: NEGATIVE
Comment: NORMAL
Comment: NEGATIVE
Comment: NEGATIVE
Comment: NEGATIVE
Comment: NEGATIVE
Comment: NEGATIVE
Comment: NORMAL
Neisseria Gonorrhea: NEGATIVE
Neisseria Gonorrhea: NEGATIVE
Trichomonas: NEGATIVE
Trichomonas: NEGATIVE

## 2022-11-08 LAB — CYTOLOGY, (ORAL, ANAL, URETHRAL) ANCILLARY ONLY
Chlamydia: NEGATIVE
Chlamydia: NEGATIVE
Comment: NEGATIVE
Comment: NEGATIVE
Comment: NORMAL
Comment: NEGATIVE
Comment: NEGATIVE
Comment: NORMAL
Neisseria Gonorrhea: NEGATIVE
Neisseria Gonorrhea: NEGATIVE
Trichomonas: NEGATIVE
Trichomonas: NEGATIVE

## 2022-11-09 ENCOUNTER — Telehealth (HOSPITAL_COMMUNITY): Payer: Self-pay | Admitting: Emergency Medicine

## 2022-11-09 MED ORDER — METRONIDAZOLE 500 MG PO TABS: 500.0000 mg | ORAL_TABLET | Freq: Two times a day (BID) | ORAL | 0 refills | Status: DC

## 2022-11-30 ENCOUNTER — Other Ambulatory Visit (HOSPITAL_COMMUNITY): Payer: Self-pay

## 2022-11-30 ENCOUNTER — Other Ambulatory Visit: Payer: Self-pay

## 2022-11-30 MED ORDER — FLUCONAZOLE 150 MG PO TABS
150.0000 mg | ORAL_TABLET | Freq: Once | ORAL | 0 refills | Status: AC
  Filled 2022-11-30 (×2): qty 1, 1d supply, fill #0

## 2022-12-06 ENCOUNTER — Encounter: Payer: Medicare Other | Attending: Surgery | Admitting: Skilled Nursing Facility1

## 2022-12-06 VITALS — Ht 63.0 in | Wt 206.6 lb

## 2022-12-06 DIAGNOSIS — G35 Multiple sclerosis: Secondary | ICD-10-CM | POA: Diagnosis not present

## 2022-12-06 DIAGNOSIS — Z713 Dietary counseling and surveillance: Secondary | ICD-10-CM | POA: Diagnosis not present

## 2022-12-06 DIAGNOSIS — I10 Essential (primary) hypertension: Secondary | ICD-10-CM | POA: Diagnosis not present

## 2022-12-06 DIAGNOSIS — Z6836 Body mass index (BMI) 36.0-36.9, adult: Secondary | ICD-10-CM | POA: Diagnosis not present

## 2022-12-06 DIAGNOSIS — E119 Type 2 diabetes mellitus without complications: Secondary | ICD-10-CM | POA: Insufficient documentation

## 2022-12-07 ENCOUNTER — Encounter: Payer: Self-pay | Admitting: Skilled Nursing Facility1

## 2022-12-07 NOTE — Progress Notes (Signed)
Bariatric Class:  Appt start time: 6:00 end time: 7:00  12 Month Post-Operative Nutrition Class  Patient was seen on 12/07/2022 for Post-Operative Nutrition education at [facility].   Surgery date: 12/13/2021 Surgery type: sleeve Start weight at [facility]: 307 pounds Weight: 206.6 pounds   Clinical  Medical hx: DM, MS Medications: blood pressure  Labs: A1C 5.2 Notable signs/symptoms: MS pain, back and sciatica pain Any previous deficiencies? No  Body Composition Scale 12/07/2022  Current Body Weight 206.6  Total Body Fat % 41.9  Visceral Fat 14  Fat-Free Mass % 58   Total Body Water % 43.5  Muscle-Mass lbs 29.7  BMI 36.2  Body Fat Displacement          Torso  lbs 53.6         Left Leg  lbs 10.7         Right Leg  lbs 10.7         Left Arm  lbs 5.3         Right Arm   lbs 5.3     The following the learning objectives were met by the patient during this course: Review of body comp scale information Share and discuss bariatric surgery successes and non-scale victories Identifies Phase VII (Maintenance Phase) Dietary Goals which will be lifelong Identifies appropriate sources of fluids, proteins, non-starchy vegetables, and complex carbohydrates Identifies well-balanced meals Identifies portion control  Identifies appropriate multivitamin and calcium sources post-operatively Describes the need for physical activity post-operatively and will follow MD recommendations Identifies and describes SMART goals  Creates at least 2 SMART goals to begin immediately States when to call healthcare provider regarding medication questions or post-operative complications  Teaching method utilized: Visual & Auditory  Demonstrated degree of understanding via: Teach Back  Readiness Level: Action Barriers to learning/adherence to lifestyle change: None Identified  Handouts given during class include: Phase VII: Maintenance Phase-Lifelong  Follow-Up  Plan: Patient will follow-up at [facility] for on-going post-op nutrition visits.

## 2023-01-09 ENCOUNTER — Other Ambulatory Visit (HOSPITAL_COMMUNITY): Payer: Self-pay | Admitting: Surgery

## 2023-01-09 DIAGNOSIS — N2581 Secondary hyperparathyroidism of renal origin: Secondary | ICD-10-CM

## 2023-01-09 DIAGNOSIS — E213 Hyperparathyroidism, unspecified: Secondary | ICD-10-CM

## 2023-01-10 ENCOUNTER — Ambulatory Visit (INDEPENDENT_AMBULATORY_CARE_PROVIDER_SITE_OTHER): Payer: Medicare Other | Admitting: Diagnostic Neuroimaging

## 2023-01-10 ENCOUNTER — Encounter: Payer: Self-pay | Admitting: Diagnostic Neuroimaging

## 2023-01-10 VITALS — BP 136/83 | HR 87 | Ht 62.0 in

## 2023-01-10 DIAGNOSIS — G35D Multiple sclerosis, unspecified: Secondary | ICD-10-CM

## 2023-01-10 DIAGNOSIS — G35 Multiple sclerosis: Secondary | ICD-10-CM

## 2023-01-10 NOTE — Progress Notes (Signed)
Chief Complaint  Patient presents with   Follow-up    RM 6 alone Pt is well and stable ,no new MS concerns     History of Present Illness:  UPDATE (01/10/23, VRP): Since last visit, doing well. Symptoms are stable. Severity mild. No alleviating or aggravating factors. Tolerating ocrevus.    UPDATE (01/10/22, VRP): Since last visit, doing well. Had gastric sleeve surg in 12/13/21, and already lost 30lbs. Symptoms are stable overall. Some dizziness with standing.   UPDATE (06/14/21, VRP): Since last visit, doing well. Symptoms are stable overall, except right leg now hurting more. Left leg pain stable. Tolerating ocrevus.     UPDATE (11/10/20, VRP): Since last visit, doing about the same. Symptoms are stable. Severity is moderate. No alleviating or aggravating factors. Tolerating meds. Had some positional dizziness, but improved with hydration.  UPDATE (04/28/20, VRP): Since last visit, had to leave work due to long term disability expiring. Applying for medicaid. MS symptoms stable. HA stable. However, back and left leg pain are worsened. Not able to work because of low back pain and left leg pain, cannot sit for prolonged time and severe pain. Not driving right now. More depression.  PRIOR HPI:  - MS sxs are stable; tolerating ocrevus - left sciatica pain is stable; continues to struggle with pain mgmt; has tried [facility] without relief; now seeing chiropractor and doing better - mood, nutrition, exercise are improving - has been out of work since Feb 2020    Observations/Objective:  GENERAL EXAM/CONSTITUTIONAL: Vitals:  Vitals:   01/10/23 1431  BP: 136/83  Pulse: 87  Height: '5\' 2"'$  (1.575 m)   Body mass index is 37.79 kg/m. Wt Readings from Last 3 Encounters:  12/07/22 206 lb 9.6 oz (93.7 kg)  07/28/22 217 lb 8 oz (98.7 kg)  04/28/22 243 lb (110.2 kg)   Patient is in no distress; well developed, nourished and groomed; neck is supple  CARDIOVASCULAR: Examination of carotid  arteries is normal; no carotid bruits Regular rate and rhythm, no murmurs Examination of peripheral vascular system by observation and palpation is normal  EYES: Ophthalmoscopic exam of optic discs and posterior segments is normal; no papilledema or hemorrhages No results found.  MUSCULOSKELETAL: Gait, strength, tone, movements noted in Neurologic exam below  NEUROLOGIC: MENTAL STATUS:      No data to display         awake, alert, oriented to person, place and time recent and remote memory intact normal attention and concentration language fluent, comprehension intact, naming intact fund of knowledge appropriate  CRANIAL NERVE:  2nd - no papilledema on fundoscopic exam 2nd, 3rd, 4th, 6th - pupils equal and reactive to light, visual fields full to confrontation, extraocular muscles intact, no nystagmus 5th - facial sensation symmetric 7th - facial strength symmetric 8th - hearing intact 9th - palate elevates symmetrically, uvula midline 11th - shoulder shrug symmetric 12th - tongue protrusion midline  MOTOR:  normal bulk and tone, full strength in the BUE BLE 4 prox, 5 distal  SENSORY:  normal and symmetric to light touch, temperature, vibration  COORDINATION:  finger-nose-finger, fine finger movements normal  REFLEXES:  deep tendon reflexes present and symmetric  GAIT/STATION:  narrow based gait; USING CANE  Lab Results  Component Value Date   WBC 15.2 (H) 12/14/2021   HGB 10.4 (L) 12/14/2021   HCT 34.5 (L) 12/14/2021   MCV 70.8 (L) 12/14/2021   PLT 299 12/14/2021       Latest Ref Rng &  Units 12/13/2021    5:59 AM 12/07/2021   12:16 PM 06/14/2021    2:39 PM  CMP  Glucose 70 - 99 mg/dL 121  115  103   BUN 6 - 20 mg/dL '14  14  11   '$ Creatinine 0.44 - 1.00 mg/dL 0.63  0.61  0.72   Sodium 135 - 145 mmol/L 134  138  139   Potassium 3.5 - 5.1 mmol/L 3.2  3.4  4.2   Chloride 98 - 111 mmol/L 102  104  105   CO2 22 - 32 mmol/L '24  26  23   '$ Calcium 8.9 - 10.3  mg/dL 10.6  10.6  10.7   Total Protein 6.5 - 8.1 g/dL 7.4   6.4   Total Bilirubin 0.3 - 1.2 mg/dL 0.6   <0.2   Alkaline Phos 38 - 126 U/L 92   96   AST 15 - 41 U/L 84   37   ALT 0 - 44 U/L 99   43      01/07/19 MRI brain  - No change since September of 2018. Multiple cerebral hemispheric white matter lesions consistent with chronic multiple sclerosis. No new or progressive lesions. No lesions show  restricted diffusion or contrast enhancement.  01/07/19 MRI lumbar spine - L4-5: Bilateral facet arthropathy with gaping, fluid-filled joints. Mild biforaminal disc bulges. No visible neural compression in this position. With standing or flexion, the patient could develop anterolisthesis, which could be significant. Facet arthropathy could certainly be a cause of back pain or referred facet syndrome pain as well.  - L5-S1: Small left paracentral disc protrusion which approaches the left S1 nerve but does not visibly compress or displace it. It is possible this could be associated with left S1 nerve irritation. Mild facet osteoarthritis at this leel could contribute to back pain.    06/25/21 MRI of the brain with and without contrast shows the following: 1.   Multiple T2/FLAIR hyperintense foci in the hemispheres in a pattern and configuration consistent with chronic demyelinating plaque associated with multiple sclerosis.  None of the foci appear to be acute.  They do not enhance.  Compared to the MRI dated 02/05/2019, there are no new lesions 2.   5 to 6 mm right parietal enhancing mass most consistent with a small meningioma, unchanged in appearance compared to the 02/05/2019 MRI. 3.   No acute findings.  06/25/21 MRI of the lumbar spine without contrast shows the following: 1.   At L4-L5, there is 1 to 2 mm anterolisthesis associated with facet hypertrophy and bulging of the uncovered disc.  This causes mild to moderate right greater than left foraminal and lateral recess stenosis though there does not  appear to be nerve root compression.  Compared to the 2020 MRI, joint effusions are no longer noted but the anterolisthesis has developed.  2.  At L5-S1, there is a stable small left paramedian disc protrusion and mild facet hypertrophy causing moderate left lateral recess stenosis though the S1 nerve root does not appear to be compressed.    Assessment and Plan:  52 y.o. female with:   Dx:  No diagnosis found.      MULTIPLE SCLEROSIS (relapsing, remitting multiple sclerosis; established, stable) - continue ocrevus (every March, Sept) - continue exercises and PT - continue vitamin D - monitor CBC, CMP, Ig levels annually - consider MRI 2025   LIGHTHEADED / DIZZINESS (improved) - monitor; maintain hydration  LEFT LEG PAIN / BACK PAIN (persistent; sciatica; ?  piriformis syndrome) - follow up with Dr. Jimmye Norman (chiro); limitation of sitting, standing and mobility due to pain   OBESITY - s/p gastric sleeve (12/13/21); doing well   MIGRAINE HEADACHES (established, stable) - monitor for now   MENINGIOMA (small, asymptomatic) - incidental finding; reassured patient  Orders Placed This Encounter  Procedures   CBC with Differential/Platelet   Immunoglobulins, QN, A/E/G/M   Comprehensive metabolic panel   Follow Up Instructions:  - Return in about 11 months (around 12/11/2023).     Penni Bombard, MD [id number], [id number] PM Certified in Neurology, Neurophysiology and Neuroimaging  [facility] [address] [place], [place] [address] [phone]

## 2023-01-13 LAB — CBC WITH DIFFERENTIAL/PLATELET
Basophils Absolute: 0.1 10*3/uL (ref 0.0–0.2)
Basos: 1 %
EOS (ABSOLUTE): 0.2 10*3/uL (ref 0.0–0.4)
Eos: 4 %
Hematocrit: 39.2 % (ref 34.0–46.6)
Hemoglobin: 11.3 g/dL (ref 11.1–15.9)
Immature Grans (Abs): 0 10*3/uL (ref 0.0–0.1)
Immature Granulocytes: 0 %
Lymphocytes Absolute: 1.6 10*3/uL (ref 0.7–3.1)
Lymphs: 35 %
MCH: 20 pg — ABNORMAL LOW (ref 26.6–33.0)
MCHC: 28.8 g/dL — ABNORMAL LOW (ref 31.5–35.7)
MCV: 69 fL — ABNORMAL LOW (ref 79–97)
Monocytes Absolute: 0.8 10*3/uL (ref 0.1–0.9)
Monocytes: 18 %
Neutrophils Absolute: 1.9 10*3/uL (ref 1.4–7.0)
Neutrophils: 42 %
Platelets: 279 10*3/uL (ref 150–450)
RBC: 5.65 x10E6/uL — ABNORMAL HIGH (ref 3.77–5.28)
RDW: 21.9 % — ABNORMAL HIGH (ref 11.7–15.4)
WBC: 4.6 10*3/uL (ref 3.4–10.8)

## 2023-01-13 LAB — COMPREHENSIVE METABOLIC PANEL
ALT: 14 IU/L (ref 0–32)
AST: 18 IU/L (ref 0–40)
Albumin/Globulin Ratio: 2 (ref 1.2–2.2)
Albumin: 4.5 g/dL (ref 3.8–4.9)
Alkaline Phosphatase: 136 IU/L — ABNORMAL HIGH (ref 44–121)
BUN/Creatinine Ratio: 17 (ref 9–23)
BUN: 12 mg/dL (ref 6–24)
Bilirubin Total: 0.3 mg/dL (ref 0.0–1.2)
CO2: 22 mmol/L (ref 20–29)
Calcium: 11.1 mg/dL — ABNORMAL HIGH (ref 8.7–10.2)
Chloride: 105 mmol/L (ref 96–106)
Creatinine, Ser: 0.71 mg/dL (ref 0.57–1.00)
Globulin, Total: 2.3 g/dL (ref 1.5–4.5)
Glucose: 101 mg/dL — ABNORMAL HIGH (ref 70–99)
Potassium: 4 mmol/L (ref 3.5–5.2)
Sodium: 140 mmol/L (ref 134–144)
Total Protein: 6.8 g/dL (ref 6.0–8.5)
eGFR: 102 mL/min/{1.73_m2} (ref >59)

## 2023-01-13 LAB — IMMUNOGLOBULINS A/E/G/M, SERUM
IgA/Immunoglobulin A, Serum: 268 mg/dL (ref 87–352)
IgE (Immunoglobulin E), Serum: 12 IU/mL (ref 6–495)
IgG (Immunoglobin G), Serum: 1007 mg/dL (ref 586–1602)
IgM (Immunoglobulin M), Srm: 29 mg/dL (ref 26–217)

## 2023-01-24 ENCOUNTER — Encounter (HOSPITAL_COMMUNITY)
Admission: RE | Admit: 2023-01-24 | Discharge: 2023-01-24 | Disposition: A | Discharge status: Home / Self Care | Payer: Medicare Other | Source: Ambulatory Visit | Attending: Surgery | Admitting: Surgery

## 2023-01-24 ENCOUNTER — Ambulatory Visit (HOSPITAL_COMMUNITY): Payer: Medicare Other

## 2023-01-24 DIAGNOSIS — N2581 Secondary hyperparathyroidism of renal origin: Secondary | ICD-10-CM

## 2023-01-24 DIAGNOSIS — E213 Hyperparathyroidism, unspecified: Secondary | ICD-10-CM

## 2023-01-24 MED ORDER — TECHNETIUM TC 99M SESTAMIBI GENERIC - CARDIOLITE
25.3000 | Freq: Once | INTRAVENOUS | Status: AC | PRN
  Administered 2023-01-24: 25.3 via INTRAVENOUS

## 2023-01-27 NOTE — Progress Notes (Signed)
Sestamibi scan is negative for adenoma.  Await USN study.  Armandina Gemma, MD [facility] [place] [facility] Office: [phone]

## 2023-01-31 ENCOUNTER — Ambulatory Visit (HOSPITAL_COMMUNITY)
Admission: RE | Admit: 2023-01-31 | Discharge: 2023-01-31 | Disposition: A | Discharge status: Home / Self Care | Payer: Medicare Other | Source: Ambulatory Visit | Attending: Surgery | Admitting: Surgery

## 2023-01-31 DIAGNOSIS — N2581 Secondary hyperparathyroidism of renal origin: Secondary | ICD-10-CM | POA: Diagnosis not present

## 2023-01-31 DIAGNOSIS — E213 Hyperparathyroidism, unspecified: Secondary | ICD-10-CM | POA: Insufficient documentation

## 2023-02-09 ENCOUNTER — Other Ambulatory Visit: Payer: Self-pay | Admitting: Surgery

## 2023-02-09 DIAGNOSIS — E210 Primary hyperparathyroidism: Secondary | ICD-10-CM

## 2023-03-14 ENCOUNTER — Other Ambulatory Visit: Payer: Self-pay | Admitting: Neurology

## 2023-03-14 MED ORDER — OCREVUS 300 MG/10ML IV SOLN: 600.0000 mg | INTRAVENOUS | 0 refills | Status: DC

## 2023-03-16 ENCOUNTER — Inpatient Hospital Stay: Admission: RE | Admit: 2023-03-16 | Payer: Medicare Other | Source: Ambulatory Visit

## 2023-04-18 ENCOUNTER — Ambulatory Visit
Admission: RE | Admit: 2023-04-18 | Discharge: 2023-04-18 | Disposition: A | Discharge status: Home / Self Care | Payer: Medicare Other | Source: Ambulatory Visit | Attending: Surgery | Admitting: Surgery

## 2023-04-18 DIAGNOSIS — E210 Primary hyperparathyroidism: Secondary | ICD-10-CM

## 2023-04-18 MED ORDER — IOPAMIDOL (ISOVUE-300) INJECTION 61%
75.0000 mL | Freq: Once | INTRAVENOUS | Status: AC | PRN
  Administered 2023-04-18: 75 mL via INTRAVENOUS

## 2023-04-27 ENCOUNTER — Other Ambulatory Visit: Payer: Self-pay

## 2023-04-27 NOTE — Patient Instructions (Addendum)
Please continue using your CPAP regularly. While your insurance requires that you use CPAP at least 4 hours each night on 70% of the nights, I recommend, that you not skip any nights and use it throughout the night if you can. Getting used to CPAP and staying with the treatment long term does take time and patience and discipline. Untreated obstructive sleep apnea when it is moderate to severe can have an adverse impact on cardiovascular health and raise her risk for heart disease, arrhythmias, hypertension, congestive heart failure, stroke and diabetes. Untreated obstructive sleep apnea causes sleep disruption, nonrestorative sleep, and sleep deprivation. This can have an impact on your day to day functioning and cause daytime sleepiness and impairment of cognitive function, memory loss, mood disturbance, and problems focussing. Using CPAP regularly can improve these symptoms.  We will update supply orders, today. Please work on sleep hygiene. Tr to use CPAP at least 4 hours every night. Continue close follow up with PCP closely.   Follow up in 1 year

## 2023-04-27 NOTE — Progress Notes (Signed)
PATIENT: [name] DOB: 04-23-1971  REASON FOR VISIT: follow up HISTORY FROM: patient  Chief Complaint  Patient presents with   Follow-up    Pt in room 1. Here for cpap follow up. Pt report doing okay. Pt reports she tries to wear nightly but falls asleep sometimes.     Dr Marjory Lies: MS Dr Frances Furbish: sleep  HISTORY OF PRESENT ILLNESS:  05/02/23 ALL:  [name] is a 52 y.o. female here today for follow up for OSA on CPAP.  She continues to do well on therapy. She is using CPAP nightly for about 3-4 hours, on average. She admits that she will often fall asleep before starting therapy. Sometimes she wakes up and has pulled her mask off. She denies concerns with machine or supplies. She has chronic insomnia. She usually only sleeps about 4-5 hours each night. She uses melatonin as needed but not sure it is very effective.   She continues regular follow up with Dr Marjory Lies. She is on Ocrevus. She has follow up scheduled 11/2023. She reports doing well. She was seen by PCP, yesterday, and prescribed [facility] for weight management.     HISTORY: (copied from Dr Teofilo Pod previous note)  Ms. Sistare is a 52 year old right-handed woman with an underlying medical history of sickle cell trait, anemia, allergies, anxiety, depression, hypertension, migraine headaches, MS (followed by my colleague, Dr. Marjory Lies), prediabetes and morbid obesity with a BMI of over 45, with recent status post weight loss surgery, who presents for follow-up consultation of her obstructive sleep apnea after interim sleep testing and starting a new AutoPap machine.  The patient is unaccompanied today.  I first met her at the request of her primary care provider on 01/13/2022, at which time she reported a prior diagnosis of sleep apnea and she was on AutoPap therapy at the time.  She had weight loss surgery in late January 2023 and had lost quite a bit of weight.  We proceeded with reevaluation with a sleep study.  She had a  baseline sleep study on 01/17/2022 which showed an AHI of 30.3/h, O2 nadir 85%.  She was advised to proceed with AutoPap therapy with a new machine.  Her set up date was 02/10/2022.  She has a ResMed air sense 11 AutoSet machine.   Today, 04/28/2022: I reviewed her AutoPap compliance data from 03/28/2022 through 04/26/2022, which is a total of 30 days, during which time she used her machine every night with percent use days greater than 4 hours at 97%, indicating excellent compliance with an average usage of 6 hours and 51 minutes, residual AHI at goal at 0.4/h, average pressure for the 95th percentile at 11.6 cm with a range of 6 to 12 cm, leak acceptable with the 95th percentile at 9.1 L/min.  She reports doing well.  She uses nasal pillows.  She has adjusted well to her new machine and has had ongoing weight loss.  She follows regularly with her nutritionist.  She hydrates well with water but still has some mouth dryness.  She benefits from treatment and is very motivated to continue with her AutoPap.  She has noticed an increase in her front tooth gap and she has an appointment to discuss Invisalign treatment.  REVIEW OF SYSTEMS: Out of a complete 14 system review of symptoms, the patient complains only of the following symptoms, fatigue, insomnia and all other reviewed systems are negative.   ALLERGIES: Allergies  Allergen Reactions   Iron Shortness Of Breath  IV only    Tysabri [Natalizumab] Other (See Comments)    Shortness of breath, joint pain, tremors   Delsym [Dextromethorphan Polistirex Er] Other (See Comments)    nightmares    HOME MEDICATIONS: Outpatient Medications Prior to Visit  Medication Sig Dispense Refill   ocrelizumab (OCREVUS) 300 MG/10ML injection Inject 20 mLs (600 mg total) into the vein See admin instructions. Every 6 months 20 mL 0   valACYclovir (VALTREX) 1000 MG tablet Take one tablet by mouth 2 times daily. Start with first symptom of outbreak. 14 tablet 0    Semaglutide-Weight Management (WEGOVY) 0.25 MG/0.5ML SOAJ Inject 0.25 mg into the skin once a week. (Patient not taking: Reported on 05/02/2023) 2 mL 2   No facility-administered medications prior to visit.    PAST MEDICAL HISTORY: Past Medical History:  Diagnosis Date   Allergy    Anemia 2008   Anxiety    Depression    Hypertension    Iron overload, transfusional    Migraine    MS (multiple sclerosis) (HCC)    OSA on CPAP    C-PAP   Pre-diabetes    Sickle cell trait (HCC)    Tachycardia     PAST SURGICAL HISTORY: Past Surgical History:  Procedure Laterality Date   CERVICAL ABLATION     CESAREAN SECTION     3 times   CHOLECYSTECTOMY  1990   HIATAL HERNIA REPAIR N/A 12/13/2021   Procedure: HERNIA REPAIR HIATAL;  Surgeon: Luretha Murphy, MD;  Location: [facility];  Service: General;  Laterality: N/A;   LASER ABLATION OF THE CERVIX     TUBAL LIGATION  1995   UPPER GI ENDOSCOPY N/A 12/13/2021   Procedure: UPPER GI ENDOSCOPY;  Surgeon: Luretha Murphy, MD;  Location: [facility];  Service: General;  Laterality: N/A;    FAMILY HISTORY: Family History  Problem Relation Age of Onset   Mental illness Mother    Hyperlipidemia Mother    Hyperthyroidism Mother    Mental retardation Mother    Other Mother        Covid   Diabetes Father    Cancer Father    Cancer Paternal Aunt        breast   Cancer Paternal Aunt        breast   Arthritis Maternal Grandmother    Diabetes Maternal Grandmother    Hearing loss Maternal Grandmother        left hear when she was a child   Hyperlipidemia Maternal Grandmother    Hypertension Maternal Grandmother    Alcohol abuse Maternal Grandfather    Early death Maternal Grandfather    ADD / ADHD Son    Multiple sclerosis Cousin    Breast cancer Neg Hx    Colon cancer Neg Hx    Esophageal cancer Neg Hx    Inflammatory bowel disease Neg Hx    Liver disease Neg Hx    Pancreatic cancer Neg Hx    Stomach cancer Neg Hx    Rectal cancer Neg Hx     Sleep apnea Neg Hx     SOCIAL HISTORY: 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, answers calls    Employer:     Comment: [employer]  Tobacco Use   Smoking  status: Former    Packs/day: 0.50    Years: 25.00    Additional pack years: 0.00    Total pack years: 12.50    Types: Cigarettes    Quit date: 09/16/2021    Years since quitting: 1.6   Smokeless tobacco: Never   Tobacco comments:    12/25/18  quit 2 months ago  Vaping Use   Vaping Use: Never used  Substance and Sexual Activity   Alcohol use: Yes    Alcohol/week: 0.0 standard drinks of alcohol    Comment: once maybe a month if that   Drug use: No   Sexual activity: Yes    Partners: Male    Birth control/protection: Condom  Other Topics Concern   Not on file  Social History Narrative   Patient lives at home with her family.   Working at the call center for [employer].    Social Determinants of Health   Financial Resource Strain: High Risk (03/17/2018)   Overall Financial Resource Strain (CARDIA)    Difficulty of Paying Living Expenses: Hard  Food Insecurity: Food Insecurity Present (03/17/2018)   Hunger Vital Sign    Worried About Running Out of Food in the Last Year: Sometimes true    Ran Out of Food in the Last Year: Never true  Transportation Needs: No Transportation Needs (03/17/2018)   PRAPARE - Administrator, Civil Service (Medical): No    Lack of Transportation (Non-Medical): No  Physical Activity: Inactive (03/17/2018)   Exercise Vital Sign    Days of Exercise per Week: 0 days    Minutes of Exercise per Session: 0 min  Stress: Stress Concern Present (03/17/2018)   [employer] of Occupational Health - Occupational Stress Questionnaire    Feeling of Stress : Rather much   Social Connections: Somewhat Isolated (03/17/2018)   Social Connection and Isolation Panel [NHANES]    Frequency of Communication with Friends and Family: More than three times a week    Frequency of Social Gatherings with Friends and Family: More than three times a week    Attends Religious Services: More than 4 times per year    Active Member of [employer] or Organizations: No    Attends Banker Meetings: Never    Marital Status: Divorced  Catering manager Violence: Not At Risk (03/17/2018)   Humiliation, Afraid, Rape, and Kick questionnaire    Fear of Current or Ex-Partner: No    Emotionally Abused: No    Physically Abused: No    Sexually Abused: No     PHYSICAL EXAM  Vitals:   05/02/23 1101 05/02/23 1105  BP: (!) 155/86 138/83  Pulse: 93 88  Weight: 208 lb (94.3 kg)   Height: 5\' 3"  (1.6 m)    Body mass index is 36.85 kg/m.  Generalized: Well developed, in no acute distress  Cardiology: normal rate and rhythm, no murmur noted Respiratory: clear to auscultation bilaterally  Neurological examination  Mentation: Alert oriented to time, place, history taking. Follows all commands speech and language fluent Cranial nerve II-XII: Pupils were equal round reactive to light. Extraocular movements were full, visual field were full  Motor: The motor testing reveals 5 over 5 strength of all 4 extremities. Good symmetric motor tone is noted throughout.  Gait and station: Gait is normal.    DIAGNOSTIC DATA (LABS, IMAGING, TESTING) - I reviewed patient records, labs, notes, testing and imaging myself where available.      No data to display  Lab Results  Component Value Date   WBC 4.6 01/10/2023   HGB 11.3 01/10/2023   HCT 39.2 01/10/2023   MCV 69 (L) 01/10/2023   PLT 279 01/10/2023      Component Value Date/Time   NA 140 01/10/2023 1516   K 4.0 01/10/2023 1516   CL 105 01/10/2023 1516   CO2 22 01/10/2023 1516   GLUCOSE 101 (H) 01/10/2023 1516    GLUCOSE 121 (H) 12/13/2021 0559   BUN 12 01/10/2023 1516   CREATININE 0.71 01/10/2023 1516   CREATININE 0.75 11/11/2015 0828   CALCIUM 11.1 (H) 01/10/2023 1516   PROT 6.8 01/10/2023 1516   ALBUMIN 4.5 01/10/2023 1516   AST 18 01/10/2023 1516   ALT 14 01/10/2023 1516   ALKPHOS 136 (H) 01/10/2023 1516   BILITOT 0.3 01/10/2023 1516   GFRNONAA >60 12/13/2021 0559   GFRNONAA >89 11/11/2015 0828   GFRAA 90 01/01/2019 1614   GFRAA >89 11/11/2015 0828   Lab Results  Component Value Date   CHOL 168 03/17/2018   HDL 44 03/17/2018   LDLCALC 107 (H) 03/17/2018   TRIG 84 03/17/2018   CHOLHDL 3.8 03/17/2018   Lab Results  Component Value Date   HGBA1C 5.8 (H) 03/17/2018   Lab Results  Component Value Date   VITAMINB12 449 12/03/2019   Lab Results  Component Value Date   TSH 0.985 03/17/2018     ASSESSMENT AND PLAN 52 y.o. year old female  has a past medical history of Allergy, Anemia (2008), Anxiety, Depression, Hypertension, Iron overload, transfusional, Migraine, MS (multiple sclerosis) (HCC), OSA on CPAP, Pre-diabetes, Sickle cell trait (HCC), and Tachycardia. here with     ICD-10-CM   1. OSA on CPAP  G47.33 For home use only DME continuous positive airway pressure (CPAP)        SUDDIE STRUVE is doing well on CPAP therapy. Compliance report reveals excellent daily but sub optimal four hour compliance. She was encouraged to continue using CPAP nightly and for greater than 4 hours each night. We will update supply orders as indicated. Risks of untreated sleep apnea review and education materials provided. We have reviewed sleep hygiene and educational info provided. MS symptoms well managed. She will continue Ocrevus per Dr Marjory Lies. Healthy lifestyle habits encouraged. She will follow up with me in 1 year, sooner if needed. She verbalizes understanding and agreement with this plan.    Orders Placed This Encounter  Procedures   For home use only DME continuous positive airway  pressure (CPAP)    Supplies    Order Specific Question:   Length of Need    Answer:   Lifetime    Order Specific Question:   Patient has OSA or probable OSA    Answer:   Yes    Order Specific Question:   Is the patient currently using CPAP in the home    Answer:   Yes    Order Specific Question:   Settings    Answer:   Other see comments    Order Specific Question:   CPAP supplies needed    Answer:   Mask, headgear, cushions, filters, heated tubing and water chamber     No orders of the defined types were placed in this encounter.     Shawnie Dapper, FNP-C 05/02/2023, 11:33 AM [facility] [address] [place], [place] [address] [phone]

## 2023-05-01 NOTE — Progress Notes (Signed)
CT is negative for adenoma as well.  Will need to see patient in the office to discuss all imaging results and make a decision about surgery.  [name] - please arrange office visit to discuss results and possible surgery.  tmg  Darnell Level, MD [facility] practice Office: [phone]

## 2023-05-02 ENCOUNTER — Other Ambulatory Visit (HOSPITAL_COMMUNITY): Payer: Self-pay

## 2023-05-02 ENCOUNTER — Ambulatory Visit: Payer: Medicare Other | Admitting: Family Medicine

## 2023-05-02 ENCOUNTER — Ambulatory Visit: Payer: Medicare Other | Admitting: Adult Health

## 2023-05-02 ENCOUNTER — Ambulatory Visit: Payer: Medicare Other | Admitting: Neurology

## 2023-05-02 ENCOUNTER — Encounter: Payer: Self-pay | Admitting: Family Medicine

## 2023-05-02 VITALS — BP 138/83 | HR 88 | Ht 63.0 in | Wt 208.0 lb

## 2023-05-02 DIAGNOSIS — G4733 Obstructive sleep apnea (adult) (pediatric): Secondary | ICD-10-CM

## 2023-05-02 MED ORDER — WEGOVY 0.25 MG/0.5ML ~~LOC~~ SOAJ
0.2500 mg | SUBCUTANEOUS | 2 refills | Status: DC
  Filled 2023-05-02 – 2023-06-03 (×5): qty 2, 28d supply, fill #0

## 2023-05-06 ENCOUNTER — Ambulatory Visit
Admission: EM | Admit: 2023-05-06 | Discharge: 2023-05-06 | Disposition: A | Discharge status: Home / Self Care | Payer: Medicare Other | Attending: Urgent Care | Admitting: Urgent Care

## 2023-05-06 DIAGNOSIS — I10 Essential (primary) hypertension: Secondary | ICD-10-CM | POA: Diagnosis not present

## 2023-05-06 DIAGNOSIS — R030 Elevated blood-pressure reading, without diagnosis of hypertension: Secondary | ICD-10-CM | POA: Diagnosis not present

## 2023-05-06 MED ORDER — AMLODIPINE BESYLATE 5 MG PO TABS: 5.0000 mg | ORAL_TABLET | Freq: Every day | ORAL | 0 refills | Status: DC

## 2023-05-06 NOTE — Discharge Instructions (Addendum)
If you get a bad headache that does not go away with Tylenol then go to the ER. Other symptoms are bad dizziness, unsteadiness, weakness on one side of your body, numbness and tingling, facial drooping, chest pain, sweating, vision loss.

## 2023-05-06 NOTE — ED Provider Notes (Signed)
[facility]  Note:  This document was prepared using Conservation officer, historic buildings and may include unintentional dictation errors.  MRN: [id number] DOB: 28-Mar-1971  Subjective:   [name] is a 52 y.o. female presenting for concerns related to her blood pressure.  Has consistently had readings in the 150s, 160s systolic and 90s, 100s diastolic the past few days.  Patient was recently taken off her blood pressure medication as she has lost a significant amount of weight and was felt to be well-controlled.  Denies history of stroke, kidney disease.  No headache, confusion, weakness, dizziness, vision changes, numbness or tingling, chest pain, diaphoresis, nausea, vomiting, abdominal pain, hematuria.  Patient is undergoing significant stress this past week as she ended a long-term relationship with her boyfriend.  She also has a history of multiple sclerosis but is currently well-managed.  No drug use.  No smoking of any kind including cigarettes, cigars, vaping, marijuana use.  She does have a PCP that she follows up with closely.  No current facility-administered medications for this encounter.  Current Outpatient Medications:    ocrelizumab (OCREVUS) 300 MG/10ML injection, Inject 20 mLs (600 mg total) into the vein See admin instructions. Every 6 months, Disp: 20 mL, Rfl: 0   Semaglutide-Weight Management (WEGOVY) 0.25 MG/0.5ML SOAJ, Inject 0.25 mg into the skin once a week. (Patient not taking: Reported on 05/02/2023), Disp: 2 mL, Rfl: 2   valACYclovir (VALTREX) 1000 MG tablet, Take one tablet by mouth 2 times daily. Start with first symptom of outbreak., Disp: 14 tablet, Rfl: 0   Allergies  Allergen Reactions   Iron Shortness Of Breath    IV only    Tysabri [Natalizumab] Other (See Comments)    Shortness of breath, joint pain, tremors   Delsym [Dextromethorphan Polistirex Er] Other (See Comments)    nightmares   Dextromethorphan Other (See Comments)     nightmares    Past Medical History:  Diagnosis Date   Allergy    Anemia 2008   Anxiety    Depression    Hypertension    Iron overload, transfusional    Migraine    MS (multiple sclerosis) (HCC)    OSA on CPAP    C-PAP   Pre-diabetes    Sickle cell trait (HCC)    Tachycardia      Past Surgical History:  Procedure Laterality Date   CERVICAL ABLATION     CESAREAN SECTION     3 times   CHOLECYSTECTOMY  1990   HIATAL HERNIA REPAIR N/A 12/13/2021   Procedure: HERNIA REPAIR HIATAL;  Surgeon: Luretha Murphy, MD;  Location: [facility];  Service: General;  Laterality: N/A;   LASER ABLATION OF THE CERVIX     TUBAL LIGATION  1995   UPPER GI ENDOSCOPY N/A 12/13/2021   Procedure: UPPER GI ENDOSCOPY;  Surgeon: Luretha Murphy, MD;  Location: [facility];  Service: General;  Laterality: N/A;    Family History  Problem Relation Age of Onset   Mental illness Mother    Hyperlipidemia Mother    Hyperthyroidism Mother    Mental retardation Mother    Other Mother        Covid   Diabetes Father    Cancer Father    Cancer Paternal Aunt        breast   Cancer Paternal Aunt        breast   Arthritis Maternal Grandmother    Diabetes Maternal Grandmother    Hearing loss Maternal  Grandmother        left hear when she was a child   Hyperlipidemia Maternal Grandmother    Hypertension Maternal Grandmother    Alcohol abuse Maternal Grandfather    Early death Maternal Grandfather    ADD / ADHD Son    Multiple sclerosis Cousin    Breast cancer Neg Hx    Colon cancer Neg Hx    Esophageal cancer Neg Hx    Inflammatory bowel disease Neg Hx    Liver disease Neg Hx    Pancreatic cancer Neg Hx    Stomach cancer Neg Hx    Rectal cancer Neg Hx    Sleep apnea Neg Hx     Social History   Tobacco Use   Smoking status: Former    Packs/day: 0.50    Years: 25.00    Additional pack years: 0.00    Total pack years: 12.50    Types: Cigarettes    Quit date: 09/16/2021    Years since quitting: 1.6    Smokeless tobacco: Never   Tobacco comments:    12/25/18  quit 2 months ago  Vaping Use   Vaping Use: Never used  Substance Use Topics   Alcohol use: Yes    Alcohol/week: 0.0 standard drinks of alcohol    Comment: once maybe a month if that   Drug use: No    ROS   Objective:   Vitals: BP (!) 152/98 (BP Location: Right Arm)   Pulse 62   Temp 98.8 F (37.1 C) (Oral)   Resp 18   LMP  (Within Months) Comment: 1 month  SpO2 98%   BP Readings from Last 3 Encounters:  05/06/23 (!) 152/98  05/02/23 138/83  01/10/23 136/83   Physical Exam Constitutional:      General: She is not in acute distress.    Appearance: Normal appearance. She is well-developed. She is not ill-appearing, toxic-appearing or diaphoretic.  HENT:     Head: Normocephalic and atraumatic.     Right Ear: External ear normal.     Left Ear: External ear normal.     Nose: Nose normal.     Mouth/Throat:     Mouth: Mucous membranes are moist.  Eyes:     General: No scleral icterus.       Right eye: No discharge.        Left eye: No discharge.     Extraocular Movements: Extraocular movements intact.  Neck:     Vascular: No carotid bruit.  Cardiovascular:     Rate and Rhythm: Normal rate and regular rhythm.     Heart sounds: Normal heart sounds. No murmur heard.    No friction rub. No gallop.  Pulmonary:     Effort: Pulmonary effort is normal. No respiratory distress.     Breath sounds: No stridor. No wheezing, rhonchi or rales.  Chest:     Chest wall: No tenderness.  Skin:    General: Skin is warm and dry.  Neurological:     General: No focal deficit present.     Mental Status: She is alert and oriented to person, place, and time.     Cranial Nerves: No cranial nerve deficit.     Motor: No weakness.     Coordination: Coordination normal.     Gait: Gait normal.     Comments: Negative Romberg and pronator drift, no facial asymmetry.  Psychiatric:        Mood and Affect: Mood normal.  Behavior: Behavior normal.        Thought Content: Thought content normal.        Judgment: Judgment normal.     Assessment and Plan :   PDMP not reviewed this encounter.  1. Essential hypertension   2. Elevated blood pressure reading    Patient is very well-appearing, has hemodynamically stable vital signs.  No signs of an acute encephalopathy, hypertensive emergency, hypertensive urgency.  Low suspicion for ACS given lack of symptoms for this.  Careful review of her recent blood pressure readings together with the reason she has today show that she has generally been in the 130s systolic.  However with her consistent readings of 150s we had a discussion of restarting her previous blood pressure medicine and amlodipine.  Patient was agreeable this to [place] so as to avoid complications from untreated high blood pressure.  I am in agreement.  Advised that she recheck with her PCP urgently and hopefully can achieve a goal of maintaining her blood pressure less than 130 systolic.  For now we will defer ER visit. Counseled patient on potential for adverse effects with medications prescribed/recommended today, ER and return-to-clinic precautions discussed, patient verbalized understanding.    Wallis Bamberg, PA-C 05/06/23 1050

## 2023-05-06 NOTE — ED Triage Notes (Signed)
Pt reports her blood pressure was 150/92 this morning. Reports she was lightheaded last night and this morning, denies at this time. Pt reports she was taking meds for hypertension and PCP told her to stopped 1 year ago.

## 2023-05-11 ENCOUNTER — Telehealth: Payer: Self-pay | Admitting: Neurology

## 2023-05-11 NOTE — Telephone Encounter (Signed)
Received a report from [employer] Pt ID: [id number]

## 2023-05-15 ENCOUNTER — Other Ambulatory Visit (HOSPITAL_COMMUNITY): Payer: Self-pay

## 2023-05-15 MED ORDER — WEGOVY 0.25 MG/0.5ML ~~LOC~~ SOAJ
SUBCUTANEOUS | 2 refills | Status: DC
  Filled 2023-05-16: qty 2, 28d supply, fill #0

## 2023-05-16 ENCOUNTER — Other Ambulatory Visit (HOSPITAL_COMMUNITY): Payer: Self-pay

## 2023-05-24 ENCOUNTER — Ambulatory Visit: Payer: Self-pay | Admitting: Surgery

## 2023-06-02 ENCOUNTER — Ambulatory Visit
Admission: EM | Admit: 2023-06-02 | Discharge: 2023-06-02 | Disposition: A | Discharge status: Home / Self Care | Payer: Medicare Other | Source: Home / Self Care | Attending: Family Medicine | Admitting: Family Medicine

## 2023-06-02 ENCOUNTER — Other Ambulatory Visit (HOSPITAL_COMMUNITY): Payer: Self-pay

## 2023-06-02 DIAGNOSIS — N760 Acute vaginitis: Secondary | ICD-10-CM

## 2023-06-02 LAB — POCT URINALYSIS DIP (MANUAL ENTRY)
Bilirubin, UA: NEGATIVE
Blood, UA: NEGATIVE
Glucose, UA: NEGATIVE mg/dL
Ketones, POC UA: NEGATIVE mg/dL
Leukocytes, UA: NEGATIVE
Nitrite, UA: NEGATIVE
Protein Ur, POC: NEGATIVE mg/dL
Spec Grav, UA: >=1.03 — AB (ref 1.010–1.025)
Urobilinogen, UA: 0.2 E.U./dL
pH, UA: 6 (ref 5.0–8.0)

## 2023-06-02 MED ORDER — AMLODIPINE BESYLATE 5 MG PO TABS
5.0000 mg | ORAL_TABLET | Freq: Every day | ORAL | 3 refills | Status: AC
  Filled 2023-06-02: qty 90, 90d supply, fill #0

## 2023-06-02 MED ORDER — FLUCONAZOLE 150 MG PO TABS: 150.0000 mg | ORAL_TABLET | ORAL | 0 refills | Status: AC

## 2023-06-02 MED ORDER — METRONIDAZOLE 500 MG PO TABS: 500.0000 mg | ORAL_TABLET | Freq: Two times a day (BID) | ORAL | 0 refills | Status: AC

## 2023-06-02 NOTE — ED Triage Notes (Signed)
Patient with c/o smelling an odor from her vaginal area. Denies any discharge or itching.

## 2023-06-02 NOTE — ED Provider Notes (Signed)
[facility]    CSN: [id number] Arrival date & time: 06/02/23  1440      History   Chief Complaint Chief Complaint  Patient presents with   Vaginitis    HPI [name] is a 52 y.o. female.   HPI Here for vaginal odor.  She has been noting it in the last 2 to 3 days.  Will actually be intermittent over the last few months.  No dysuria and no abdominal pain.  No fever or nausea.  She has had BV in the past.   she does note that the last time she took a prescription of metronidazole she had a yeast infection afterwards.    States she had wt loss surgery last yr, and has lost wt and does not need meds for DM. 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   Colon cancer screening 12/03/2019   Constipation 12/03/2019   Microcytic anemia 12/03/2019   Heartburn 12/03/2019   High risk medication use 06/12/2018   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   Other fatigue 08/05/2016   Migraine without aura and without status migrainosus, not intractable 08/05/2016   Prediabetes    MS (multiple sclerosis) (HCC)    Obesity, Class III, BMI 40-49.9 (morbid obesity) (HCC) 10/16/2014   Smoker 10/16/2014   OSA on CPAP 10/16/2014   HTN (hypertension) 01/14/2012   Sickle cell trait (HCC) 01/14/2012    Past Surgical History:  Procedure Laterality Date   CERVICAL ABLATION     CESAREAN SECTION     3 times   CHOLECYSTECTOMY  1990   HIATAL HERNIA REPAIR N/A 12/13/2021   Procedure: HERNIA REPAIR HIATAL;  Surgeon: Luretha Murphy, MD;  Location: [facility];  Service: General;   Laterality: N/A;   LASER ABLATION OF THE CERVIX     TUBAL LIGATION  1995   UPPER GI ENDOSCOPY N/A 12/13/2021   Procedure: UPPER GI ENDOSCOPY;  Surgeon: Luretha Murphy, MD;  Location: [facility];  Service: General;  Laterality: N/A;    OB History   No obstetric history on file.      Home Medications    Prior to Admission medications   Medication Sig Start Date End Date Taking? Authorizing Provider  fluconazole (DIFLUCAN) 150 MG tablet Take 1 tablet (150 mg total) by mouth every 3 (three) days for 2 doses. 06/02/23 06/06/23 Yes Judia Arnott, Janace Aris, MD  metroNIDAZOLE (FLAGYL) 500 MG tablet Take 1 tablet (500 mg total) by mouth 2 (two) times daily for 7 days. 06/02/23 06/09/23 Yes Zenia Resides, MD  amLODipine (NORVASC) 5 MG tablet Take 1 tablet (5 mg total) by mouth daily. 05/06/23   Wallis Bamberg, PA-C  amLODipine (NORVASC) 5 MG tablet Take 1 tablet (5 mg total) by mouth daily. 06/02/23     ocrelizumab (OCREVUS) 300 MG/10ML injection Inject 20 mLs (600 mg total) into the vein See admin instructions. Every 6 months 03/14/23   Penumalli, Glenford Bayley, MD  Semaglutide-Weight Management (WEGOVY) 0.25 MG/0.5ML  SOAJ Inject 0.25 mg into the skin once a week. Patient not taking: Reported on 05/02/2023 05/02/23     Semaglutide-Weight Management (WEGOVY) 0.25 MG/0.5ML SOAJ Inject 0.5 mLs (0.25 mg dose) into the skin once a week. 05/15/23     valACYclovir (VALTREX) 1000 MG tablet Take one tablet by mouth 2 times daily. Start with first symptom of outbreak. 07/01/21       Family History Family History  Problem Relation Age of Onset   Mental illness Mother    Hyperlipidemia Mother    Hyperthyroidism Mother    Mental retardation Mother    Other Mother        Covid   Diabetes Father    Cancer Father    Cancer Paternal Aunt        breast   Cancer Paternal Aunt        breast   Arthritis Maternal Grandmother    Diabetes Maternal Grandmother    Hearing loss Maternal Grandmother        left hear when she was  a child   Hyperlipidemia Maternal Grandmother    Hypertension Maternal Grandmother    Alcohol abuse Maternal Grandfather    Early death Maternal Grandfather    ADD / ADHD Son    Multiple sclerosis Cousin    Breast cancer Neg Hx    Colon cancer Neg Hx    Esophageal cancer Neg Hx    Inflammatory bowel disease Neg Hx    Liver disease Neg Hx    Pancreatic cancer Neg Hx    Stomach cancer Neg Hx    Rectal cancer Neg Hx    Sleep apnea Neg Hx     Social History Social History   Tobacco Use   Smoking status: Former    Current packs/day: 0.00    Average packs/day: 0.5 packs/day for 25.0 years (12.5 ttl pk-yrs)    Types: Cigarettes    Start date: 09/16/1996    Quit date: 09/16/2021    Years since quitting: 1.7   Smokeless tobacco: Never   Tobacco comments:    12/25/18  quit 2 months ago  Vaping Use   Vaping status: Never Used  Substance Use Topics   Alcohol use: Yes    Alcohol/week: 0.0 standard drinks of alcohol    Comment: once maybe a month if that   Drug use: No     Allergies   Iron, Tysabri [natalizumab], Delsym [dextromethorphan polistirex er], and Dextromethorphan   Review of Systems Review of Systems   Physical Exam Triage Vital Signs ED Triage Vitals  Encounter Vitals Group     BP 06/02/23 1508 127/87     Systolic BP Percentile --      Diastolic BP Percentile --      Pulse Rate 06/02/23 1508 72     Resp 06/02/23 1508 16     Temp 06/02/23 1508 98.4 F (36.9 C)     Temp src --      SpO2 06/02/23 1508 97 %     Weight --      Height --      Head Circumference --      Peak Flow --      Pain Score 06/02/23 1516 0     Pain Loc --      Pain Education --      Exclude from Growth Chart --    No data found.  Updated Vital Signs BP 127/87 (BP Location: Left Arm)   Pulse 72  Temp 98.4 F (36.9 C)   Resp 16   LMP  (Within Months)   SpO2 97%   Visual Acuity Right Eye Distance:   Left Eye Distance:   Bilateral Distance:    Right Eye Near:   Left  Eye Near:    Bilateral Near:     Physical Exam Vitals reviewed.  Constitutional:      General: She is not in acute distress.    Appearance: She is not ill-appearing, toxic-appearing or diaphoretic.  HENT:     Mouth/Throat:     Mouth: Mucous membranes are moist.  Eyes:     Extraocular Movements: Extraocular movements intact.     Pupils: Pupils are equal, round, and reactive to light.  Cardiovascular:     Rate and Rhythm: Normal rate and regular rhythm.  Pulmonary:     Effort: Pulmonary effort is normal.     Breath sounds: Normal breath sounds.  Abdominal:     Palpations: Abdomen is soft.     Tenderness: There is no abdominal tenderness.  Musculoskeletal:     Cervical back: Neck supple.  Lymphadenopathy:     Cervical: No cervical adenopathy.  Skin:    Coloration: Skin is not pale.  Neurological:     Mental Status: She is alert and oriented to person, place, and time.  Psychiatric:        Behavior: Behavior normal.      UC Treatments / Results  Labs (all labs ordered are listed, but only abnormal results are displayed) Labs Reviewed  POCT URINALYSIS DIP (MANUAL ENTRY) - Abnormal; Notable for the following components:      Result Value   Spec Grav, UA >=1.030 (*)    All other components within normal limits  CERVICOVAGINAL ANCILLARY ONLY    EKG   Radiology No results found.  Procedures Procedures (including critical care time)  Medications Ordered in UC Medications - No data to display  Initial Impression / Assessment and Plan / UC Course  I have reviewed the triage vital signs and the nursing notes.  Pertinent labs & imaging results that were available during my care of the patient were reviewed by me and considered in my medical decision making (see chart for details).        Urinalysis is clear.  The specific gravity shows it to be a little concentrated, but there is no blood or leukocytes  Vaginal self swab was done.  We will notify her in  treatment protocol any positives.  She has had BV in the past, and we decided to do empiric therapy with metronidazole.  I am also sending in Diflucan to take along with that since she had a yeast infection the last time she took metronidazole. Final Clinical Impressions(s) / UC Diagnoses   Final diagnoses:  Acute vaginitis     Discharge Instructions      The urinalysis showed your urine to be a little concentrated, but there was no sugar or blood or white blood cells.  Make sure you are drinking enough fluids.  Staff will notify if there is anything positive on the swab.  Take metronidazole 500 mg--1 tablet 2 times daily for 7 days.  Avoid drinking alcohol within 72 hours of taking this medication  Take fluconazole 150 mg--1 tablet every 3 days for 2 doses; this medication is for yeast.  I would have you start it about 2 days after you start the metronidazole, and then take it as prescribed.  That way you will  take your second dose after you are finished with the metronidazole.     ED Prescriptions     Medication Sig Dispense Auth. Provider   metroNIDAZOLE (FLAGYL) 500 MG tablet Take 1 tablet (500 mg total) by mouth 2 (two) times daily for 7 days. 14 tablet Catheline Hixon, Janace Aris, MD   fluconazole (DIFLUCAN) 150 MG tablet Take 1 tablet (150 mg total) by mouth every 3 (three) days for 2 doses. 2 tablet Marlinda Mike Janace Aris, MD      PDMP not reviewed this encounter.   Zenia Resides, MD 06/02/23 1536

## 2023-06-02 NOTE — Discharge Instructions (Signed)
The urinalysis showed your urine to be a little concentrated, but there was no sugar or blood or white blood cells.  Make sure you are drinking enough fluids.  Staff will notify if there is anything positive on the swab.  Take metronidazole 500 mg--1 tablet 2 times daily for 7 days.  Avoid drinking alcohol within 72 hours of taking this medication  Take fluconazole 150 mg--1 tablet every 3 days for 2 doses; this medication is for yeast.  I would have you start it about 2 days after you start the metronidazole, and then take it as prescribed.  That way you will take your second dose after you are finished with the metronidazole.

## 2023-06-03 ENCOUNTER — Other Ambulatory Visit (HOSPITAL_COMMUNITY): Payer: Self-pay

## 2023-06-05 LAB — CERVICOVAGINAL ANCILLARY ONLY
Bacterial Vaginitis (gardnerella): POSITIVE — AB
Candida Glabrata: NEGATIVE
Candida Vaginitis: NEGATIVE
Chlamydia: NEGATIVE
Comment: NEGATIVE
Comment: NEGATIVE
Comment: NEGATIVE
Comment: NEGATIVE
Comment: NEGATIVE
Comment: NORMAL
Neisseria Gonorrhea: NEGATIVE
Trichomonas: NEGATIVE

## 2023-06-13 ENCOUNTER — Encounter (HOSPITAL_COMMUNITY): Admission: RE | Payer: Self-pay | Source: Home / Self Care

## 2023-06-13 ENCOUNTER — Ambulatory Visit (HOSPITAL_COMMUNITY): Admission: RE | Admit: 2023-06-13 | Payer: Medicare Other | Source: Home / Self Care | Admitting: Surgery

## 2023-06-13 SURGERY — PARATHYROIDECTOMY
Anesthesia: General

## 2023-06-17 ENCOUNTER — Other Ambulatory Visit (HOSPITAL_COMMUNITY): Payer: Self-pay

## 2023-06-19 ENCOUNTER — Other Ambulatory Visit (HOSPITAL_COMMUNITY): Payer: Self-pay

## 2023-06-19 ENCOUNTER — Encounter (HOSPITAL_COMMUNITY): Payer: Self-pay

## 2023-06-19 ENCOUNTER — Other Ambulatory Visit: Payer: Self-pay

## 2023-06-19 MED ORDER — FLUCONAZOLE 150 MG PO TABS
150.0000 mg | ORAL_TABLET | ORAL | 0 refills | Status: AC
  Filled 2023-06-19: qty 1, 2d supply, fill #0

## 2023-06-19 MED ORDER — METRONIDAZOLE 500 MG PO TABS
ORAL_TABLET | ORAL | 0 refills | Status: DC
  Filled 2023-06-19: qty 14, 7d supply, fill #0

## 2023-06-28 ENCOUNTER — Other Ambulatory Visit (HOSPITAL_COMMUNITY): Payer: Self-pay

## 2023-06-28 ENCOUNTER — Other Ambulatory Visit: Payer: Self-pay

## 2023-06-28 MED ORDER — RYBELSUS 3 MG PO TABS
3.0000 mg | ORAL_TABLET | Freq: Every day | ORAL | 2 refills | Status: DC
  Filled 2023-06-28 – 2023-06-29 (×2): qty 30, 30d supply, fill #0

## 2023-06-29 ENCOUNTER — Other Ambulatory Visit (HOSPITAL_COMMUNITY): Payer: Self-pay

## 2023-06-29 ENCOUNTER — Other Ambulatory Visit: Payer: Self-pay

## 2023-07-04 ENCOUNTER — Encounter: Payer: Self-pay | Admitting: Dietician

## 2023-07-04 ENCOUNTER — Encounter: Payer: Medicare Other | Attending: Surgery | Admitting: Dietician

## 2023-07-04 VITALS — Wt 216.8 lb

## 2023-07-04 DIAGNOSIS — E669 Obesity, unspecified: Secondary | ICD-10-CM | POA: Insufficient documentation

## 2023-07-04 DIAGNOSIS — Z6838 Body mass index (BMI) 38.0-38.9, adult: Secondary | ICD-10-CM | POA: Diagnosis not present

## 2023-07-04 DIAGNOSIS — Z713 Dietary counseling and surveillance: Secondary | ICD-10-CM | POA: Insufficient documentation

## 2023-07-04 NOTE — Progress Notes (Signed)
Follow-up visit:  Post-Operative sleeve Surgery  Surgery date: 12/13/2021 Surgery type: sleeve  Start weight at [facility]: 307 pounds Weight today:  pounds   Body Composition Scale 12/29/2021 02/07/2022 04/28/2022 07/28/2022 12/07/2022 07/04/2023  Current Body Weight 280.2 265.2 243 217.5 206.6 216.8  Total Body Fat % 48.7 48.3 46.6 42.8 41.9 43.2  Visceral Fat 21 20 18 14 14 15   Fat-Free Mass % 51.2 51.6 53.3 57.1 58 56.7   Total Body Water % 40.1 40.3 41.1 43 43.5 42.8  Muscle-Mass lbs 30.9 29.8 29.3 30.2 29.7 29.8  BMI 48.6 47.8 44.3 37.6 36.2 38.2  Body Fat Displacement               Torso  lbs 84.8 79.4 70.2 57.7 53.6 58.0         Left Leg  lbs 16.9 15.8 14 11.5 10.7 11.6         Right Leg  lbs 16.9 15.8 14 11.5 10.7 11.6         Left Arm  lbs 8.4 7.9 7 5.7 5.3 5.8         Right Arm   lbs 8.4 7.9 7 5.7 5.3 5.8   Clinical  Medical hx: DM, MS Medications: blood pressure, rebelsis, protonix for acid reflux, biotin, lysine Labs: vit D 23.3; iron saturation 13; hemoglobin 10.8; Ca 10.7; ferritin 9 Notable signs/symptoms: MS pain, back and sciatica pain Any previous deficiencies? No  Lifestyle & Dietary Hx  [facility] a [facility], stating she will go by herself because no one will come with her. Pt states she is having thyroid surgery next month. Pt states her iron has always been low. Pt states she can't get an iron IV anymore, stating she is allergic to it. Pt states she is will start going to [facility] through [facility] in [place].  Estimated daily fluid intake: 64+ oz Estimated daily protein intake: 60+ g Supplements: bariatric multi and calcium  Current average weekly physical activity: daily: 15-20 minutes walking hills or stationary bike (pt states she tries to get 7,000-9,000 steps a day.  24-Hr Dietary Recall First Meal: oatmeal and half a banana, water or peanut butter with rice cake and fruit or eggs with spinach and  fruit. Snack:  wheat thins Second Meal: burger with half bun Snack:  fruit, tuna, rice crackers Third Meal: asparagus and pork chop or healthy choice dinner or salad Snack:  Beverages: water, water with flavorings  Post-Op Goals/ Signs/ Symptoms Using straws: no Drinking while eating: no Chewing/swallowing difficulties: no Changes in vision: no Changes to mood/headaches: no Hair loss/changes to skin/nails: no Difficulty focusing/concentrating: no Sweating: no Limb weakness: no Dizziness/lightheadedness: no Palpitations: no  Carbonated/caffeinated beverages: no N/V/D/C/Gas: no; using mirilax daily Abdominal pain: no Dumping syndrome: no    NUTRITION DIAGNOSIS  Overweight/obesity ([employer]-3.3) related to past poor dietary habits and physical inactivity as evidenced by completed bariatric surgery and following dietary guidelines for continued weight loss and healthy nutrition status.     NUTRITION INTERVENTION Nutrition counseling (C-1) and education (E-2) to facilitate bariatric surgery goals, including: The importance of consuming adequate calories as well as certain nutrients daily due to the body's need for essential vitamins, minerals, and fats The importance of daily physical activity and to reach a goal of at least 150 minutes of moderate to vigorous physical activity weekly (or as directed by their physician) due to benefits such as increased musculature and improved lab values  The importance of intuitive eating specifically learning hunger-satiety cues and understanding the importance of learning a new body: The importance of mindful eating to avoid grazing behaviors  Encouraged patient to honor their body's internal hunger and fullness cues.  Throughout the day, check in mentally and rate hunger. Stop eating when satisfied not full regardless of how much food is left on the plate.  Get more if still hungry 20-30 minutes later.  The key is to honor satisfaction so throughout the  meal, rate fullness factor and stop when comfortably satisfied not physically full. The key is to honor hunger and fullness without any feelings of guilt or shame.  Pay attention to what the internal cues are, rather than any external factors. This will enhance the confidence you have in listening to your own body and following those internal cues enabling you to increase how often you eat when you are hungry not out of appetite and stop when you are satisfied not full.  Encouraged pt to continue to eat balanced meals inclusive of non starchy vegetables 2 times a day 7 days a week Encouraged pt to choose lean protein sources: limiting beef, pork, sausage, hotdogs, and lunch meat Encourage pt to choose healthy fats such as plant based limiting animal fats Encouraged pt to continue to drink a minium 64 fluid ounces with half being plain water to satisfy proper hydration    Goals: -have protein source with every meal and snack -aim for 2 or more servings of non-starchy vegetables -increase physical activity; go the gym ([facility]), daily for 30-60 minuts  Handouts Provided Include  Bariatric Maintenance (1+ years)  Learning Style & Readiness for Change Teaching method utilized: Visual & Auditory  Demonstrated degree of understanding via: Teach Back  Readiness Level: action Barriers to learning/adherence to lifestyle change: none identified   RD's Notes for Next Visit Assess adherence to pt chosen goals   MONITORING & EVALUATION Dietary intake, weekly physical activity, body weight  Next Steps Patient will follow-up when needed.

## 2023-07-05 ENCOUNTER — Ambulatory Visit
Admission: EM | Admit: 2023-07-05 | Discharge: 2023-07-05 | Disposition: A | Discharge status: Home / Self Care | Payer: Medicare Other | Attending: Physician Assistant | Admitting: Physician Assistant

## 2023-07-05 DIAGNOSIS — N760 Acute vaginitis: Secondary | ICD-10-CM | POA: Insufficient documentation

## 2023-07-05 NOTE — ED Triage Notes (Signed)
"  I have been having his recurrent yeast infection the last few visits". "The smell won't go away". "I would like a Gyn exam if possible".

## 2023-07-05 NOTE — ED Provider Notes (Signed)
[facility]    CSN: [id number] Arrival date & time: 07/05/23  0849      History   Chief Complaint Chief Complaint  Patient presents with   Vaginitis    Recurrent    HPI [name] is a 52 y.o. female.   Patient here today for evaluation of possible recurrent yeast infection.  She states that she has had white discharge and has an odor that will not seem to go away despite having treatment with both Diflucan and metronidazole.  She does not report any other symptoms.  The history is provided by the patient.    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   Colon cancer screening 12/03/2019   Constipation 12/03/2019   Microcytic anemia 12/03/2019   Heartburn 12/03/2019   High risk medication use 06/12/2018   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   Other fatigue 08/05/2016   Migraine without aura and without status migrainosus, not intractable 08/05/2016   Prediabetes    MS (multiple sclerosis) (HCC)    Obesity, Class III, BMI 40-49.9 (morbid obesity) (HCC) 10/16/2014   Smoker 10/16/2014   OSA on CPAP 10/16/2014   HTN (hypertension) 01/14/2012   Sickle cell trait (HCC) 01/14/2012    Past Surgical History:  Procedure Laterality Date   CERVICAL ABLATION     CESAREAN SECTION     3 times   CHOLECYSTECTOMY  1990   HIATAL HERNIA REPAIR N/A 12/13/2021   Procedure: HERNIA REPAIR HIATAL;  Surgeon: Luretha Murphy, MD;  Location: [facility];  Service: General;  Laterality: N/A;   LASER ABLATION OF THE CERVIX     TUBAL LIGATION  1995   UPPER GI ENDOSCOPY  N/A 12/13/2021   Procedure: UPPER GI ENDOSCOPY;  Surgeon: Luretha Murphy, MD;  Location: [facility];  Service: General;  Laterality: N/A;    OB History   No obstetric history on file.      Home Medications    Prior to Admission medications   Medication Sig Start Date End Date Taking? Authorizing Provider  amLODipine (NORVASC) 5 MG tablet Take 1 tablet (5 mg total) by mouth daily. 06/02/23  Yes   BIOTIN PO Place 1 mL under the tongue daily. With Collagen (15000 mg per drop)   Yes [provider]  L-Lysine 1000 MG TABS Take 1,000 mg by mouth daily.   Yes [provider]  Multiple Vitamins-Minerals (BARIATRIC FUSION) CHEW Chew 1 each by mouth daily.   Yes [provider]  pantoprazole (PROTONIX) 40 MG tablet Take 40 mg by mouth daily. 06/27/23 07/27/23 Yes [provider]  Semaglutide (RYBELSUS) 3 MG TABS Take 1 tablet (3 mg total) by mouth daily. 06/27/23  Yes   ocrelizumab (OCREVUS) 300 MG/10ML injection Inject 20 mLs (600 mg total) into the vein See admin instructions. Every 6 months 03/14/23   Penumalli, Glenford Bayley, MD  valACYclovir (VALTREX) 1000 MG tablet Take one tablet by mouth 2 times daily. Start with first symptom of  outbreak. Patient not taking: Reported on 07/03/2023 07/01/21       Family History Family History  Problem Relation Age of Onset   Mental illness Mother    Hyperlipidemia Mother    Hyperthyroidism Mother    Mental retardation Mother    Other Mother        Covid   Diabetes Father    Cancer Father    Cancer Paternal Aunt        breast   Cancer Paternal Aunt        breast   Arthritis Maternal Grandmother    Diabetes Maternal Grandmother    Hearing loss Maternal Grandmother        left hear when she was a child   Hyperlipidemia Maternal Grandmother    Hypertension Maternal Grandmother    Alcohol abuse Maternal Grandfather    Early death Maternal Grandfather    ADD / ADHD Son    Multiple sclerosis Cousin    Breast cancer Neg Hx     Colon cancer Neg Hx    Esophageal cancer Neg Hx    Inflammatory bowel disease Neg Hx    Liver disease Neg Hx    Pancreatic cancer Neg Hx    Stomach cancer Neg Hx    Rectal cancer Neg Hx    Sleep apnea Neg Hx     Social History Social History   Tobacco Use   Smoking status: Former    Current packs/day: 0.00    Average packs/day: 0.5 packs/day for 25.0 years (12.5 ttl pk-yrs)    Types: Cigarettes    Start date: 09/16/1996    Quit date: 09/16/2021    Years since quitting: 1.8   Smokeless tobacco: Never   Tobacco comments:    12/25/18  quit 2 months ago  Vaping Use   Vaping status: Never Used  Substance Use Topics   Alcohol use: Yes    Alcohol/week: 0.0 standard drinks of alcohol    Comment: once maybe a month if that   Drug use: No     Allergies   Iron, Tysabri [natalizumab], Delsym [dextromethorphan polistirex er], and Dextromethorphan   Review of Systems Review of Systems  Constitutional:  Negative for chills and fever.  Eyes:  Negative for discharge and redness.  Respiratory:  Negative for shortness of breath.   Gastrointestinal:  Negative for abdominal pain, nausea and vomiting.  Genitourinary:  Positive for vaginal bleeding and vaginal discharge. Negative for genital sores.     Physical Exam Triage Vital Signs ED Triage Vitals  Encounter Vitals Group     BP 07/05/23 0859 129/80     Systolic BP Percentile --      Diastolic BP Percentile --      Pulse Rate 07/05/23 0859 76     Resp 07/05/23 0859 18     Temp 07/05/23 0859 98.2 F (36.8 C)     Temp Source 07/05/23 0859 Oral     SpO2 07/05/23 0859 98 %     Weight 07/05/23 0855 216 lb (98 kg)     Height 07/05/23 0855 5\' 3"  (1.6 m)     Head Circumference --      Peak Flow --      Pain Score 07/05/23 0855 0     Pain Loc --      Pain Education --      Exclude from Growth Chart --    No data found.  Updated Vital Signs BP 129/80 (BP Location: Right Arm)  Pulse 76   Temp 98.2 F (36.8 C) (Oral)    Resp 18   Ht 5\' 3"  (1.6 m)   Wt 216 lb (98 kg)   LMP 05/29/2023 (Approximate)   SpO2 98%   BMI 38.26 kg/m      Physical Exam Vitals and nursing note reviewed.  Constitutional:      General: She is not in acute distress.    Appearance: Normal appearance. She is not ill-appearing.  HENT:     Head: Normocephalic and atraumatic.  Eyes:     Conjunctiva/sclera: Conjunctivae normal.  Cardiovascular:     Rate and Rhythm: Normal rate.  Pulmonary:     Effort: Pulmonary effort is normal. No respiratory distress.  Genitourinary:    Vagina: Vaginal discharge (scant- white, clumpy) present.  Neurological:     Mental Status: She is alert.  Psychiatric:        Mood and Affect: Mood normal.        Behavior: Behavior normal.        Thought Content: Thought content normal.      UC Treatments / Results  Labs (all labs ordered are listed, but only abnormal results are displayed) Labs Reviewed  CERVICOVAGINAL ANCILLARY ONLY    EKG   Radiology No results found.  Procedures Procedures (including critical care time)  Medications Ordered in UC Medications - No data to display  Initial Impression / Assessment and Plan / UC Course  I have reviewed the triage vital signs and the nursing notes.  Pertinent labs & imaging results that were available during my care of the patient were reviewed by me and considered in my medical decision making (see chart for details).    Will order screening for BV/ yeast and STDs. Will await results for further recommendation regarding treatment.  Final Clinical Impressions(s) / UC Diagnoses   Final diagnoses:  Acute vaginitis   Discharge Instructions   None    ED Prescriptions   None    PDMP not reviewed this encounter.   Tomi Bamberger, PA-C 07/05/23 1238

## 2023-07-06 ENCOUNTER — Telehealth: Payer: Self-pay

## 2023-07-06 LAB — CERVICOVAGINAL ANCILLARY ONLY
Bacterial Vaginitis (gardnerella): POSITIVE — AB
Candida Glabrata: NEGATIVE
Candida Vaginitis: NEGATIVE
Chlamydia: NEGATIVE
Comment: NEGATIVE
Comment: NEGATIVE
Comment: NEGATIVE
Comment: NEGATIVE
Comment: NEGATIVE
Comment: NORMAL
Neisseria Gonorrhea: NEGATIVE
Trichomonas: NEGATIVE

## 2023-07-06 MED ORDER — FLUCONAZOLE 150 MG PO TABS: 150.0000 mg | ORAL_TABLET | Freq: Once | ORAL | 0 refills | Status: AC

## 2023-07-06 MED ORDER — METRONIDAZOLE 500 MG PO TABS: 500.0000 mg | ORAL_TABLET | Freq: Two times a day (BID) | ORAL | 0 refills | Status: AC

## 2023-07-06 NOTE — Telephone Encounter (Signed)
Per protocol, pt requires tx with metronidazole. Reviewed with patient, verified pharmacy, prescription sent.

## 2023-07-09 NOTE — Patient Instructions (Addendum)
SURGICAL WAITING ROOM VISITATION Patients having surgery or a procedure may have no more than 2 support people in the waiting area - these visitors may rotate in the visitor waiting room.   Due to an increase in RSV and influenza rates and associated hospitalizations, children ages 75 and under may not visit patients in [facility] hospitals. If the patient needs to stay at the hospital during part of their recovery, the visitor guidelines for inpatient rooms apply.  PRE-OP VISITATION  Pre-op nurse will coordinate an appropriate time for 1 support person to accompany the patient in pre-op.  This support person may not rotate.  This visitor will be contacted when the time is appropriate for the visitor to come back in the pre-op area.  Please refer to the [facility] website for the visitor guidelines for Inpatients (after your surgery is over and you are in a regular room).  You are not required to quarantine at this time prior to your surgery. However, you must do this: Hand Hygiene often Do NOT share personal items Notify your provider if you are in close contact with someone who has COVID or you develop fever 100.4 or greater, new onset of sneezing, cough, sore throat, shortness of breath or body aches.  If you test positive for Covid or have been in contact with anyone that has tested positive in the last 10 days please notify you surgeon.    Your procedure is scheduled on:  Monday  July 24, 2023  Report to [facility] Main Entrance: [place] entrance where the [employer] is available.   Report to admitting at: 12:15    AM  Call this number if you have any questions or problems the morning of surgery [phone]  Do not eat food after Midnight the night prior to your surgery/procedure.  After Midnight you may have the following liquids until   11:30   AM  DAY OF SURGERY  Clear Liquid Diet Water Black Coffee (sugar ok, NO MILK/CREAM OR CREAMERS)  Tea (sugar ok, NO  MILK/CREAM OR CREAMERS) regular and decaf                             Plain Jell-O  with no fruit (NO RED)                                           Fruit ices (not with fruit pulp, NO RED)                                     Popsicles (NO RED)                                                                  Juice: NO CITRUS JUICES: only apple, WHITE grape, WHITE cranberry Sports drinks like Gatorade or Powerade (NO RED)                FOLLOW ANY ADDITIONAL PRE OP INSTRUCTIONS YOU RECEIVED FROM YOUR SURGEON'S OFFICE!!!   Oral Hygiene is also important  to reduce your risk of infection.        Remember - BRUSH YOUR TEETH THE MORNING OF SURGERY WITH YOUR REGULAR TOOTHPASTE  Do NOT smoke after Midnight the night before surgery.  SEMAGLUTIDE (RYBELSUS)- Stop taking this medication x 24 hours before your surgery.  Last dose will be taken on Saturday 07-22-23  STOP TAKING all Vitamins, Herbs and supplements 1 week before your surgery.   Take ONLY these medicines the morning of surgery with A SIP OF WATER: Pantoprazole (Protonix), amlodipine    If You have been diagnosed with Sleep Apnea - Bring CPAP mask and tubing day of surgery. We will provide you with a CPAP machine on the day of your surgery.                   You may not have any metal on your body including hair pins, jewelry, and body piercing  Do not wear make-up, lotions, powders, perfumes, or deodorant  Do not wear nail polish including gel and S&S, artificial / acrylic nails, or any other type of covering on natural nails including finger and toenails. If you have artificial nails, gel coating, etc., that needs to be removed by a nail salon, Please have this removed prior to surgery. Not doing so may mean that your surgery could be cancelled or delayed if the Surgeon or anesthesia staff feels like they are unable to monitor you safely.   Do not shave 48 hours prior to surgery to avoid nicks in your skin which may contribute to  postoperative infections.    Contacts, Hearing Aids, dentures or bridgework may not be worn into surgery. DENTURES WILL BE REMOVED PRIOR TO SURGERY PLEASE DO NOT APPLY "Poly grip" OR ADHESIVES!!!  You may bring a small overnight bag with you on the day of surgery, only pack items that are not valuable. [facility] IS NOT RESPONSIBLE   FOR VALUABLES THAT ARE LOST OR STOLEN.    Do not bring your home medications to the hospital. The Pharmacy will dispense medications listed on your medication list to you during your admission in the Hospital.  Special Instructions: Bring a copy of your healthcare power of attorney and living will documents the day of surgery, if you wish to have them scanned into your [facility] Records- EPIC  Please read over the following fact sheets you were given: IF YOU HAVE QUESTIONS ABOUT YOUR PRE-OP INSTRUCTIONS, PLEASE CALL [phone]   [facility] - Preparing for Surgery Before surgery, you can play an important role.  Because skin is not sterile, your skin needs to be as free of germs as possible.  You can reduce the number of germs on your skin by washing with CHG (chlorahexidine gluconate) soap before surgery.  CHG is an antiseptic cleaner which kills germs and bonds with the skin to continue killing germs even after washing. Please DO NOT use if you have an allergy to CHG or antibacterial soaps.  If your skin becomes reddened/irritated stop using the CHG and inform your nurse when you arrive at Short Stay. Do not shave (including legs and underarms) for at least 48 hours prior to the first CHG shower.  You may shave your face/neck.  Please follow these instructions carefully:  1.  Shower with CHG Soap the night before surgery and the  morning of surgery.  2.  If you choose to wash your hair, wash your hair first as usual with your normal  shampoo.  3.  After  you shampoo, rinse your hair and body thoroughly to remove the shampoo.                              4.  Use CHG as you would any other liquid soap.  You can apply chg directly to the skin and wash.  Gently with a scrungie or clean washcloth.  5.  Apply the CHG Soap to your body ONLY FROM THE NECK DOWN.   Do not use on face/ open                           Wound or open sores. Avoid contact with eyes, ears mouth and genitals (private parts).                       Wash face,  Genitals (private parts) with your normal soap.             6.  Wash thoroughly, paying special attention to the area where your  surgery  will be performed.  7.  Thoroughly rinse your body with warm water from the neck down.  8.  DO NOT shower/wash with your normal soap after using and rinsing off the CHG Soap.            9.  Pat yourself dry with a clean towel.            10.  Wear clean pajamas.            11.  Place clean sheets on your bed the night of your first shower and do not  sleep with pets.  ON THE DAY OF SURGERY : Do not apply any lotions/deodorants the morning of surgery.  Please wear clean clothes to the hospital/surgery center.     FAILURE TO FOLLOW THESE INSTRUCTIONS MAY RESULT IN THE CANCELLATION OF YOUR SURGERY  PATIENT SIGNATURE_________________________________  NURSE SIGNATURE__________________________________  ________________________________________________________________________

## 2023-07-09 NOTE — Progress Notes (Signed)
COVID Vaccine received:  []  No [x]  Yes Date of any COVID positive Test in last 90 days:  none  PCP - Tracey Harries, MD   Porfirio Oar, PA-C at [facility] medical [phone] (Work) [phone] (Fax)   Cardiologist - None Neurologist- Jamelle Rushing, MD Pain Medicine- Arsenio Loader, NP   [phone]  Chest x-ray - 07-21-2021  2v  Epic EKG -(07-21-2021)  will repeat at PST   Stress Test -  ECHO -  Cardiac Cath -   PCR screen: []  Ordered & Completed           []   No Order but Needs PROFEND           [x]   N/A for this surgery  Surgery Plan:  []  Ambulatory   [x]  Outpatient in bed  []  Admit  Anesthesia:    [x]  General  []  Spinal  []   Choice []   MAC  Pacemaker / ICD device [x]  No []  Yes   Spinal Cord Stimulator:[x]  No []  Yes       History of Sleep Apnea? []  No [x]  Yes   CPAP used?- []  No [x]  Yes    Does the patient monitor blood sugar?  [x]  No []  Yes  []  N/A  Patient has: []  NO Hx DM   [x]  Pre-DM   []  DM1  []   DM2 Last A1c was: 5.5   on   04-11-2023    GLP1 agonist / usual dose - Semaglutide   GLP1 instructions: Hold x 24 hours  Blood Thinner / Instructions: none Aspirin Instructions:  none  ERAS Protocol Ordered: []  No  [x]  Yes PRE-SURGERY []  ENSURE  []  G2   [x]  No Drink Ordered Patient is to be NPO after: 12:15  pm  Comments:    Activity level: Patient is able to climb a flight of stairs without difficulty; [x]  No CP  [x]  No SOB.  Patient can perform ADLs without assistance.   Anesthesia review: OSO-CPAP, MS (Ocrevus next injection 08-08-23), HTN, Pre-DM, anemia, sickle cell trait, S/p sleeve gastrectomy, Smokes Vapes   Patient denies shortness of breath, fever, cough and chest pain at PAT appointment.  Patient verbalized understanding and agreement to the Pre-Surgical Instructions that were given to them at this PAT appointment. Patient was also educated of the need to review these PAT instructions again prior to her surgery.I reviewed the appropriate  phone numbers to call if they have any and questions or concerns.

## 2023-07-12 ENCOUNTER — Encounter (HOSPITAL_COMMUNITY): Payer: Self-pay

## 2023-07-12 ENCOUNTER — Other Ambulatory Visit: Payer: Self-pay

## 2023-07-12 ENCOUNTER — Encounter (HOSPITAL_COMMUNITY): Admission: RE | Admit: 2023-07-12 | Payer: Medicare Other | Source: Ambulatory Visit

## 2023-07-12 VITALS — BP 124/79 | HR 66 | Temp 98.6°F | Resp 18 | Ht 63.0 in | Wt 216.0 lb

## 2023-07-12 DIAGNOSIS — Z01812 Encounter for preprocedural laboratory examination: Secondary | ICD-10-CM | POA: Insufficient documentation

## 2023-07-12 DIAGNOSIS — Z01810 Encounter for preprocedural cardiovascular examination: Secondary | ICD-10-CM | POA: Diagnosis not present

## 2023-07-12 DIAGNOSIS — R7989 Other specified abnormal findings of blood chemistry: Secondary | ICD-10-CM | POA: Insufficient documentation

## 2023-07-12 DIAGNOSIS — Z01818 Encounter for other preprocedural examination: Secondary | ICD-10-CM

## 2023-07-12 DIAGNOSIS — I10 Essential (primary) hypertension: Secondary | ICD-10-CM | POA: Insufficient documentation

## 2023-07-12 DIAGNOSIS — R7303 Prediabetes: Secondary | ICD-10-CM | POA: Diagnosis not present

## 2023-07-12 HISTORY — DX: Gastro-esophageal reflux disease without esophagitis: K21.9

## 2023-07-12 HISTORY — DX: Celiac disease: K90.0

## 2023-07-12 LAB — CBC
HCT: 37.7 % (ref 36.0–46.0)
Hemoglobin: 11.1 g/dL — ABNORMAL LOW (ref 12.0–15.0)
MCH: 20.8 pg — ABNORMAL LOW (ref 26.0–34.0)
MCHC: 29.4 g/dL — ABNORMAL LOW (ref 30.0–36.0)
MCV: 70.7 fL — ABNORMAL LOW (ref 80.0–100.0)
Platelets: 309 10*3/uL (ref 150–400)
RBC: 5.33 MIL/uL — ABNORMAL HIGH (ref 3.87–5.11)
RDW: 19.6 % — ABNORMAL HIGH (ref 11.5–15.5)
WBC: 7.6 10*3/uL (ref 4.0–10.5)
nRBC: 0 % (ref 0.0–0.2)

## 2023-07-12 LAB — COMPREHENSIVE METABOLIC PANEL
ALT: 19 U/L (ref 0–44)
AST: 20 U/L (ref 15–41)
Albumin: 3.8 g/dL (ref 3.5–5.0)
Alkaline Phosphatase: 94 U/L (ref 38–126)
Anion gap: 7 (ref 5–15)
BUN: 10 mg/dL (ref 6–20)
CO2: 23 mmol/L (ref 22–32)
Calcium: 10.3 mg/dL (ref 8.9–10.3)
Chloride: 107 mmol/L (ref 98–111)
Creatinine, Ser: 0.55 mg/dL (ref 0.44–1.00)
GFR, Estimated: >60 mL/min (ref >60)
Glucose, Bld: 104 mg/dL — ABNORMAL HIGH (ref 70–99)
Potassium: 3.3 mmol/L — ABNORMAL LOW (ref 3.5–5.1)
Sodium: 137 mmol/L (ref 135–145)
Total Bilirubin: 0.4 mg/dL (ref 0.3–1.2)
Total Protein: 6.8 g/dL (ref 6.5–8.1)

## 2023-07-16 ENCOUNTER — Encounter (HOSPITAL_COMMUNITY): Payer: Self-pay | Admitting: Surgery

## 2023-07-16 DIAGNOSIS — E210 Primary hyperparathyroidism: Secondary | ICD-10-CM | POA: Diagnosis present

## 2023-07-16 NOTE — H&P (Signed)
PROVIDER: Shantina Chronister Myra Rude, MD  Chief Complaint: Follow-up (Primary hyperparathyroidism)  History of Present Illness:  Patient returns for follow-up having undergone an extensive evaluation for primary hyperparathyroidism. Imaging studies included an ultrasound of the neck, a nuclear medicine parathyroid scan with sestamibi, and a 4D CT scan of the neck with parathyroid protocol. All of the studies failed to indicate the presence of a parathyroid adenoma. However, the patient has laboratory studies showing an elevated calcium level of 11.3, and intact PTH level of 113, and a normal vitamin D level of 32.8. 24-hour urine collection for calcium was markedly elevated at 548. Patient is relatively asymptomatic.  Patient resents today to discuss options for management.  Review of Systems: A complete review of systems was obtained from the patient. I have reviewed this information and discussed as appropriate with the patient. See HPI as well for other ROS.  ROS   Medical History: Past Medical History:  Diagnosis Date  Anemia  Anxiety  Arthritis  GERD (gastroesophageal reflux disease)  Hypertension  Sleep apnea   Patient Active Problem List  Diagnosis  Morbid (severe) obesity due to excess calories (CMS/HHS-HCC)  Adjustment disorder with mixed anxiety and depressed mood  Diabetes mellitus type 2 in obese (CMS/HHS-HCC)  Facet arthropathy, lumbosacral  Former smoker  Heartburn  High risk medication use  History of COVID-19  HSV-2 seropositive  Hypertension associated with diabetes (CMS/HHS-HCC)  Lumbar disc herniation with radiculopathy  Migraine without aura and without status migrainosus, not intractable  MS (multiple sclerosis) (CMS/HHS-HCC)  Sickle cell trait (CMS-HCC)  Smoker  Bariatric surgery status  Colon cancer screening  Constipation  DDD (degenerative disc disease), lumbosacral  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)  Lumbar spondylosis  Microcytic  anemia  Midline low back pain with left-sided sciatica  Other fatigue  Prediabetes  BMI 40.0-44.9, adult (CMS/HHS-HCC)  Obesity, Class III, BMI 40-49.9 (morbid obesity) (CMS/HHS-HCC)  Postgastrectomy malabsorption (HHS-HCC)  Sickle cell trait (CMS-HCC)  MS (multiple sclerosis) (CMS/HHS-HCC)  Diabetes mellitus type 2 in obese (CMS/HHS-HCC)  Secondary hyperparathyroidism (CMS/HHS-HCC)  DDD (degenerative disc disease), lumbosacral  Hypertension associated with diabetes (CMS/HHS-HCC)  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)  Midline low back pain with left-sided sciatica  Class 2 severe obesity due to excess calories with serious comorbidity in adult (CMS/HHS-HCC)  MS (multiple sclerosis) (CMS/HHS-HCC)  OSA on CPAP  Primary hyperparathyroidism (CMS/HHS-HCC)  Class 2 severe obesity due to excess calories with serious comorbidity and body mass index (BMI) of 35.0 to 35.9 in adult (CMS/HHS-HCC)   Past Surgical History:  Procedure Laterality Date  LAPAROSCOPIC SLEEVE GASTRECTOMY N/A 12/13/2021  CESAREAN SECTION  x3  CHOLECYSTECTOMY  LAPAROSCOPIC TUBAL LIGATION  1995    Allergies  Allergen Reactions  Iron Shortness Of Breath  IV only  IV only   Natalizumab Other (See Comments)  Shortness of breath, joint pain, tremors Shortness of breath, joint pain, tremors  Dextromethorphan Polistirex Other (See Comments)  nightmares   Current Outpatient Medications on File Prior to Visit  Medication Sig Dispense Refill  amLODIPine (NORVASC) 5 MG tablet Take 5 mg by mouth once daily  ocrelizumab (OCREVUS) 30 mg/mL injection Inject into the vein  semaglutide (WEGOVY) 0.25 mg/0.5 mL pen injector Inject subcutaneously  amLODIPine (NORVASC) 5 MG tablet Take one tablet (5 mg dose) by mouth daily. (Patient not taking: Reported on 03/03/2022)  atorvastatin (LIPITOR) 10 MG tablet Take one tablet (10 mg dose) by mouth daily. (Patient not taking: Reported on 03/03/2022)  calcium carbonate (CALCIUM  500  ORAL) Take by mouth  HYDROcodone-acetaminophen (NORCO) 7.5-325 mg tablet Take one tablet by mouth every 6 (six) hours as needed for Pain. (Patient not taking: Reported on 03/03/2022)  ibuprofen (MOTRIN) 200 MG tablet Take by mouth (Patient not taking: Reported on 03/03/2022)  metFORMIN (GLUCOPHAGE-XR) 500 MG XR tablet Take by mouth (Patient not taking: Reported on 03/03/2022)  multivitamin Chew Take by mouth  pantoprazole (PROTONIX) 40 MG DR tablet Take 1 tablet by mouth daily. 90 tablet 0  telmisartan-hydrochlorothiazide (MICARDIS HCT) 80-25 mg tablet Take 1 tablet by mouth once daily (Patient not taking: Reported on 03/03/2022)  valACYclovir (VALTREX) 1000 MG tablet Take one tablet by mouth 2 times daily. Start with first symptom of outbreak. (Patient not taking: Reported on 06/03/2022)  zolpidem (AMBIEN) 10 mg tablet Take by mouth (Patient not taking: Reported on 03/03/2022)   No current facility-administered medications on file prior to visit.   Family History  Problem Relation Age of Onset  High blood pressure (Hypertension) Mother  Hyperlipidemia (Elevated cholesterol) Mother  Diabetes Mother    Social History   Tobacco Use  Smoking Status Every Day  Smokeless Tobacco Never    Social History   Socioeconomic History  Marital status: Divorced  Tobacco Use  Smoking status: Every Day  Smokeless tobacco: Never  Substance and Sexual Activity  Alcohol use: Not Currently  Drug use: Not Currently   Social Determinants of Health   Financial Resource Strain: Low Risk (04/10/2023)  Received from [facility]  Overall Financial Resource Strain (CARDIA)  Difficulty of Paying Living Expenses: Not very hard  Food Insecurity: No Food Insecurity (04/10/2023)  Received from [facility]  Hunger Vital Sign  Worried About Running Out of Food in the Last Year: Never true  Ran Out of Food in the Last Year: Never true  Transportation Needs: No Transportation Needs (04/10/2023)  Received  from [facility] - Transportation  Lack of Transportation (Medical): No  Lack of Transportation (Non-Medical): No  Physical Activity: Insufficiently Active (04/10/2023)  Received from [facility]  Exercise Vital Sign  Days of Exercise per Week: 4 days  Minutes of Exercise per Session: 30 min  Stress: No Stress Concern Present (04/10/2023)  Received from [facility] - Occupational Stress Questionnaire  Feeling of Stress : Only a little  Social Connections: Somewhat Isolated (04/10/2023)  Received from [facility]  Social Network  How would you rate your social network (family, work, friends)?: Restricted participation with some degree of social isolation  Housing Stability: Unknown (01/05/2023)  Received from [facility] Stability Vital Sign  Unable to Pay for Housing in the Last Year: Patient refused  In the last 12 months, was there a time when you did not have a steady place to sleep or slept in a shelter (including now)?: No   Objective:   Vitals:   PainSc: 0-No pain   There is no height or weight on file to calculate BMI.  Physical Exam   Anterior examination of the neck shows it to be symmetric. Palpation reveals no nodules or masses. There is no tenderness. There is no lymphadenopathy. Voice quality is normal.   Assessment and Plan:   Primary hyperparathyroidism (CMS/HHS-HCC)  Today the patient and I reviewed the results of her imaging studies. We discussed options for management. I have recommended proceeding with neck exploration and anticipated parathyroidectomy. We discussed the possibility of not finding a parathyroid adenoma on exploration and requiring additional imaging studies  and interventions. We also discussed not proceeding with surgery and continuing to follow her laboratory values.  At this time the patient is agreeable to proceeding with neck exploration. This may be an outpatient  procedure versus a possible overnight stay. We discussed the size and location of the surgical incision. We discussed her postoperative recovery. She understands and wishes to proceed with surgery in the near future.  Darnell Level, MD [facility] Surgery A [facility] practice Office: [phone]

## 2023-07-24 ENCOUNTER — Other Ambulatory Visit: Payer: Self-pay

## 2023-07-24 ENCOUNTER — Ambulatory Visit (HOSPITAL_BASED_OUTPATIENT_CLINIC_OR_DEPARTMENT_OTHER): Payer: Medicare Other | Admitting: Certified Registered Nurse Anesthetist

## 2023-07-24 ENCOUNTER — Encounter (HOSPITAL_COMMUNITY)
Admission: RE | Disposition: A | Discharge status: Home / Self Care | Payer: Self-pay | Source: Home / Self Care | Attending: Surgery

## 2023-07-24 ENCOUNTER — Ambulatory Visit (HOSPITAL_COMMUNITY): Payer: Medicare Other | Admitting: Certified Registered Nurse Anesthetist

## 2023-07-24 ENCOUNTER — Ambulatory Visit (HOSPITAL_COMMUNITY)
Admission: RE | Admit: 2023-07-24 | Discharge: 2023-07-25 | Disposition: A | Discharge status: Home / Self Care | Payer: Medicare Other | Attending: Surgery | Admitting: Surgery

## 2023-07-24 ENCOUNTER — Encounter (HOSPITAL_COMMUNITY): Payer: Self-pay | Admitting: Surgery

## 2023-07-24 DIAGNOSIS — E213 Hyperparathyroidism, unspecified: Secondary | ICD-10-CM | POA: Diagnosis not present

## 2023-07-24 DIAGNOSIS — I10 Essential (primary) hypertension: Secondary | ICD-10-CM | POA: Diagnosis not present

## 2023-07-24 DIAGNOSIS — D351 Benign neoplasm of parathyroid gland: Secondary | ICD-10-CM | POA: Insufficient documentation

## 2023-07-24 DIAGNOSIS — G4733 Obstructive sleep apnea (adult) (pediatric): Secondary | ICD-10-CM | POA: Diagnosis not present

## 2023-07-24 DIAGNOSIS — F17200 Nicotine dependence, unspecified, uncomplicated: Secondary | ICD-10-CM | POA: Insufficient documentation

## 2023-07-24 DIAGNOSIS — E210 Primary hyperparathyroidism: Secondary | ICD-10-CM | POA: Diagnosis present

## 2023-07-24 DIAGNOSIS — K219 Gastro-esophageal reflux disease without esophagitis: Secondary | ICD-10-CM | POA: Diagnosis not present

## 2023-07-24 DIAGNOSIS — Z01818 Encounter for other preprocedural examination: Secondary | ICD-10-CM

## 2023-07-24 HISTORY — PX: PARATHYROIDECTOMY: SHX19

## 2023-07-24 LAB — POCT PREGNANCY, URINE: Preg Test, Ur: NEGATIVE

## 2023-07-24 SURGERY — PARATHYROIDECTOMY
Anesthesia: General | Site: Neck

## 2023-07-24 MED ORDER — CHLORHEXIDINE GLUCONATE CLOTH 2 % EX PADS: 6.0000 | MEDICATED_PAD | Freq: Once | CUTANEOUS | Status: DC

## 2023-07-24 MED ORDER — ACETAMINOPHEN 650 MG RE SUPP: 650.0000 mg | Freq: Four times a day (QID) | RECTAL | Status: DC | PRN

## 2023-07-24 MED ORDER — ONDANSETRON HCL 4 MG/2ML IJ SOLN
INTRAMUSCULAR | Status: DC | PRN
  Administered 2023-07-24: 4 mg via INTRAVENOUS

## 2023-07-24 MED ORDER — AMLODIPINE BESYLATE 5 MG PO TABS
5.0000 mg | ORAL_TABLET | Freq: Every day | ORAL | Status: DC
  Administered 2023-07-25: 5 mg via ORAL
  Filled 2023-07-24: qty 1

## 2023-07-24 MED ORDER — 0.9 % SODIUM CHLORIDE (POUR BTL) OPTIME
TOPICAL | Status: DC | PRN
  Administered 2023-07-24: 1000 mL

## 2023-07-24 MED ORDER — EPHEDRINE 5 MG/ML INJ
INTRAVENOUS | Status: AC
  Filled 2023-07-24: qty 5

## 2023-07-24 MED ORDER — ONDANSETRON HCL 4 MG/2ML IJ SOLN
INTRAMUSCULAR | Status: AC
  Filled 2023-07-24: qty 2

## 2023-07-24 MED ORDER — SODIUM CHLORIDE 0.45 % IV SOLN: INTRAVENOUS | Status: DC

## 2023-07-24 MED ORDER — FENTANYL CITRATE (PF) 100 MCG/2ML IJ SOLN
INTRAMUSCULAR | Status: AC
  Filled 2023-07-24: qty 2

## 2023-07-24 MED ORDER — ROCURONIUM BROMIDE 10 MG/ML (PF) SYRINGE
PREFILLED_SYRINGE | INTRAVENOUS | Status: DC | PRN
  Administered 2023-07-24: 100 mg via INTRAVENOUS

## 2023-07-24 MED ORDER — BUPIVACAINE-EPINEPHRINE 0.25% -1:200000 IJ SOLN
INTRAMUSCULAR | Status: AC
  Filled 2023-07-24: qty 1

## 2023-07-24 MED ORDER — DEXAMETHASONE SODIUM PHOSPHATE 10 MG/ML IJ SOLN
INTRAMUSCULAR | Status: DC | PRN
  Administered 2023-07-24: 8 mg via INTRAVENOUS

## 2023-07-24 MED ORDER — ORAL CARE MOUTH RINSE: 15.0000 mL | Freq: Once | OROMUCOSAL | Status: AC

## 2023-07-24 MED ORDER — ACETAMINOPHEN 10 MG/ML IV SOLN
INTRAVENOUS | Status: AC
  Filled 2023-07-24: qty 100

## 2023-07-24 MED ORDER — CEFAZOLIN SODIUM-DEXTROSE 2-4 GM/100ML-% IV SOLN
2.0000 g | INTRAVENOUS | Status: AC
  Administered 2023-07-24: 2 g via INTRAVENOUS
  Filled 2023-07-24: qty 100

## 2023-07-24 MED ORDER — ONDANSETRON 4 MG PO TBDP: 4.0000 mg | ORAL_TABLET | Freq: Four times a day (QID) | ORAL | Status: DC | PRN

## 2023-07-24 MED ORDER — ACETAMINOPHEN 10 MG/ML IV SOLN
1000.0000 mg | Freq: Once | INTRAVENOUS | Status: DC | PRN
  Administered 2023-07-24: 1000 mg via INTRAVENOUS

## 2023-07-24 MED ORDER — SUGAMMADEX SODIUM 200 MG/2ML IV SOLN
INTRAVENOUS | Status: DC | PRN
  Administered 2023-07-24: 200 mg via INTRAVENOUS

## 2023-07-24 MED ORDER — SEMAGLUTIDE 3 MG PO TABS: 3.0000 mg | ORAL_TABLET | Freq: Every day | ORAL | Status: DC

## 2023-07-24 MED ORDER — ONDANSETRON HCL 4 MG/2ML IJ SOLN: 4.0000 mg | Freq: Once | INTRAMUSCULAR | Status: DC | PRN

## 2023-07-24 MED ORDER — DEXAMETHASONE SODIUM PHOSPHATE 10 MG/ML IJ SOLN
INTRAMUSCULAR | Status: AC
  Filled 2023-07-24: qty 1

## 2023-07-24 MED ORDER — OXYCODONE HCL 5 MG/5ML PO SOLN: 5.0000 mg | Freq: Once | ORAL | Status: DC | PRN

## 2023-07-24 MED ORDER — CHLORHEXIDINE GLUCONATE 0.12 % MT SOLN
15.0000 mL | Freq: Once | OROMUCOSAL | Status: AC
  Administered 2023-07-24: 15 mL via OROMUCOSAL

## 2023-07-24 MED ORDER — LACTATED RINGERS IV SOLN: INTRAVENOUS | Status: DC

## 2023-07-24 MED ORDER — EPHEDRINE SULFATE-NACL 50-0.9 MG/10ML-% IV SOSY
PREFILLED_SYRINGE | INTRAVENOUS | Status: DC | PRN
Start: 2023-07-24 — End: 2023-07-24
  Administered 2023-07-24: 10 mg via INTRAVENOUS

## 2023-07-24 MED ORDER — PANTOPRAZOLE SODIUM 40 MG PO TBEC
40.0000 mg | DELAYED_RELEASE_TABLET | Freq: Every day | ORAL | Status: DC
  Administered 2023-07-24 – 2023-07-25 (×2): 40 mg via ORAL
  Filled 2023-07-24 (×2): qty 1

## 2023-07-24 MED ORDER — ACETAMINOPHEN 325 MG PO TABS: 650.0000 mg | ORAL_TABLET | Freq: Four times a day (QID) | ORAL | Status: DC | PRN

## 2023-07-24 MED ORDER — FENTANYL CITRATE PF 50 MCG/ML IJ SOSY
PREFILLED_SYRINGE | INTRAMUSCULAR | Status: AC
  Filled 2023-07-24: qty 2

## 2023-07-24 MED ORDER — MIDAZOLAM HCL 2 MG/2ML IJ SOLN
INTRAMUSCULAR | Status: AC
  Filled 2023-07-24: qty 2

## 2023-07-24 MED ORDER — ONDANSETRON HCL 4 MG/2ML IJ SOLN: 4.0000 mg | Freq: Four times a day (QID) | INTRAMUSCULAR | Status: DC | PRN

## 2023-07-24 MED ORDER — LIDOCAINE 2% (20 MG/ML) 5 ML SYRINGE
INTRAMUSCULAR | Status: DC | PRN
  Administered 2023-07-24: 100 mg via INTRAVENOUS

## 2023-07-24 MED ORDER — TRAMADOL HCL 50 MG PO TABS
50.0000 mg | ORAL_TABLET | Freq: Four times a day (QID) | ORAL | Status: DC | PRN
  Administered 2023-07-24: 50 mg via ORAL
  Filled 2023-07-24: qty 1

## 2023-07-24 MED ORDER — FENTANYL CITRATE (PF) 250 MCG/5ML IJ SOLN
INTRAMUSCULAR | Status: DC | PRN
  Administered 2023-07-24 (×4): 50 ug via INTRAVENOUS

## 2023-07-24 MED ORDER — MIDAZOLAM HCL 5 MG/5ML IJ SOLN
INTRAMUSCULAR | Status: DC | PRN
  Administered 2023-07-24: 2 mg via INTRAVENOUS

## 2023-07-24 MED ORDER — OXYCODONE HCL 5 MG PO TABS: 5.0000 mg | ORAL_TABLET | Freq: Once | ORAL | Status: DC | PRN

## 2023-07-24 MED ORDER — HYDROMORPHONE HCL 1 MG/ML IJ SOLN: 1.0000 mg | INTRAMUSCULAR | Status: DC | PRN

## 2023-07-24 MED ORDER — HEMOSTATIC AGENTS (NO CHARGE) OPTIME
TOPICAL | Status: DC | PRN
  Administered 2023-07-24: 1 via TOPICAL

## 2023-07-24 MED ORDER — FENTANYL CITRATE PF 50 MCG/ML IJ SOSY
25.0000 ug | PREFILLED_SYRINGE | INTRAMUSCULAR | Status: DC | PRN
  Administered 2023-07-24: 50 ug via INTRAVENOUS

## 2023-07-24 MED ORDER — PROPOFOL 10 MG/ML IV BOLUS
INTRAVENOUS | Status: DC | PRN
  Administered 2023-07-24: 150 mg via INTRAVENOUS
  Administered 2023-07-24 (×2): 20 mg via INTRAVENOUS

## 2023-07-24 MED ORDER — ROCURONIUM BROMIDE 10 MG/ML (PF) SYRINGE
PREFILLED_SYRINGE | INTRAVENOUS | Status: AC
  Filled 2023-07-24: qty 10

## 2023-07-24 MED ORDER — OXYCODONE HCL 5 MG PO TABS: 5.0000 mg | ORAL_TABLET | ORAL | Status: DC | PRN

## 2023-07-24 SURGICAL SUPPLY — 35 items
ADH SKN CLS APL DERMABOND .7 (GAUZE/BANDAGES/DRESSINGS) ×1
APL PRP STRL LF DISP 70% ISPRP (MISCELLANEOUS) ×1
ATTRACTOMAT 16X20 MAGNETIC DRP (DRAPES) ×1 IMPLANT
BAG COUNTER SPONGE SURGICOUNT (BAG) ×1 IMPLANT
BAG SPNG CNTER NS LX DISP (BAG)
BLADE SURG 15 STRL LF DISP TIS (BLADE) ×1 IMPLANT
BLADE SURG 15 STRL SS (BLADE) ×1
CHLORAPREP W/TINT 26 (MISCELLANEOUS) ×1 IMPLANT
CLIP TI MEDIUM 6 (CLIP) ×2 IMPLANT
CLIP TI WIDE RED SMALL 6 (CLIP) ×2 IMPLANT
COVER SURGICAL LIGHT HANDLE (MISCELLANEOUS) ×1 IMPLANT
DERMABOND ADVANCED .7 DNX12 (GAUZE/BANDAGES/DRESSINGS) ×1 IMPLANT
DRAPE LAPAROTOMY T 98X78 PEDS (DRAPES) ×1 IMPLANT
DRAPE UTILITY XL STRL (DRAPES) ×1 IMPLANT
ELECT REM PT RETURN 15FT ADLT (MISCELLANEOUS) ×1 IMPLANT
GAUZE 4X4 16PLY ~~LOC~~+RFID DBL (SPONGE) ×1 IMPLANT
GLOVE SURG ORTHO 8.0 STRL STRW (GLOVE) ×1 IMPLANT
GOWN STRL REUS W/ TWL XL LVL3 (GOWN DISPOSABLE) ×3 IMPLANT
GOWN STRL REUS W/TWL XL LVL3 (GOWN DISPOSABLE) ×3
HEMOSTAT SURGICEL 2X4 FIBR (HEMOSTASIS) ×1 IMPLANT
ILLUMINATOR WAVEGUIDE N/F (MISCELLANEOUS) IMPLANT
KIT BASIN OR (CUSTOM PROCEDURE TRAY) ×1 IMPLANT
KIT TURNOVER KIT A (KITS) IMPLANT
NDL HYPO 22X1.5 SAFETY MO (MISCELLANEOUS) ×1 IMPLANT
NEEDLE HYPO 22X1.5 SAFETY MO (MISCELLANEOUS) ×1
PACK BASIC VI WITH GOWN DISP (CUSTOM PROCEDURE TRAY) ×1 IMPLANT
PENCIL SMOKE EVACUATOR (MISCELLANEOUS) ×1 IMPLANT
SHEARS HARMONIC 9CM CVD (BLADE) IMPLANT
SUT MNCRL AB 4-0 PS2 18 (SUTURE) ×1 IMPLANT
SUT VIC AB 3-0 SH 18 (SUTURE) ×1 IMPLANT
SYR BULB IRRIG 60ML STRL (SYRINGE) ×1 IMPLANT
SYR CONTROL 10ML LL (SYRINGE) ×1 IMPLANT
TOWEL OR 17X26 10 PK STRL BLUE (TOWEL DISPOSABLE) ×1 IMPLANT
TOWEL OR NON WOVEN STRL DISP B (DISPOSABLE) ×1 IMPLANT
TUBING CONNECTING 10 (TUBING) ×1 IMPLANT

## 2023-07-24 NOTE — Anesthesia Procedure Notes (Signed)
Procedure Name: Intubation Date/Time: 07/24/2023 2:19 PM  Performed by: Demetrio Lapping, CRNAPre-anesthesia Checklist: Patient identified, Emergency Drugs available, Suction available and Patient being monitored Patient Re-evaluated:Patient Re-evaluated prior to induction Oxygen Delivery Method: Circle System Utilized Preoxygenation: Pre-oxygenation with 100% oxygen Induction Type: IV induction Ventilation: Mask ventilation without difficulty Laryngoscope Size: Mac and 3 Grade View: Grade I Tube type: Oral Number of attempts: 1 Airway Equipment and Method: Stylet and Oral airway Placement Confirmation: ETT inserted through vocal cords under direct vision, positive ETCO2 and breath sounds checked- equal and bilateral Secured at: 22 cm Tube secured with: Tape Dental Injury: Teeth and Oropharynx as per pre-operative assessment

## 2023-07-24 NOTE — Transfer of Care (Signed)
Immediate Anesthesia Transfer of Care Note  Patient: [name]  Procedure(s) Performed: NECK EXPLORATION WITH PARATHYROIDECTOMY (Neck)  Patient Location: PACU  Anesthesia Type:General  Level of Consciousness: drowsy  Airway & Oxygen Therapy: Patient Spontanous Breathing and Patient connected to face mask oxygen  Post-op Assessment: Report given to RN and Post -op Vital signs reviewed and stable  Post vital signs: Reviewed and stable  Last Vitals:  Vitals Value Taken Time  BP 154/91 07/24/23 1556  Temp    Pulse 68 07/24/23 1557  Resp 15 07/24/23 1557  SpO2 100 % 07/24/23 1557  Vitals shown include unfiled device data.  Last Pain:  Vitals:   07/24/23 1227  TempSrc: Oral         Complications: No notable events documented.

## 2023-07-24 NOTE — Anesthesia Postprocedure Evaluation (Signed)
Anesthesia Post Note  Patient: [name]  Procedure(s) Performed: NECK EXPLORATION WITH PARATHYROIDECTOMY (Neck)     Patient location during evaluation: PACU Anesthesia Type: General Level of consciousness: awake and alert Pain management: pain level controlled Vital Signs Assessment: post-procedure vital signs reviewed and stable Respiratory status: spontaneous breathing, nonlabored ventilation, respiratory function stable and patient connected to nasal cannula oxygen Cardiovascular status: blood pressure returned to baseline and stable Postop Assessment: no apparent nausea or vomiting Anesthetic complications: no   No notable events documented.  Last Vitals:  Vitals:   07/24/23 1700 07/24/23 1723  BP: 135/72 (!) 143/95  Pulse: (!) 58 60  Resp: 13 17  Temp: 36.7 C 36.8 C  SpO2: 100% 99%    Last Pain:  Vitals:   07/24/23 1723  TempSrc: Oral  PainSc:                  Mariann Barter

## 2023-07-24 NOTE — Anesthesia Preprocedure Evaluation (Signed)
Anesthesia Evaluation  Patient identified by MRN, date of birth, ID band Patient awake    Reviewed: Allergy & Precautions, NPO status , Patient's Chart, lab work & pertinent test results, reviewed documented beta blocker date and time   History of Anesthesia Complications Negative for: history of anesthetic complications  Airway Mallampati: III  TM Distance: >3 FB Neck ROM: Full    Dental no notable dental hx.    Pulmonary sleep apnea , neg COPD, former smoker   breath sounds clear to auscultation       Cardiovascular hypertension, (-) CAD, (-) Past MI, (-) Cardiac Stents and (-) CABG (-) dysrhythmias (-) Valvular Problems/Murmurs Rhythm:Regular Rate:Normal     Neuro/Psych  Headaches, neg Seizures PSYCHIATRIC DISORDERS Anxiety Depression       GI/Hepatic ,GERD  ,,(+) neg Cirrhosis        Endo/Other  neg diabetes    Renal/GU Renal disease     Musculoskeletal  (+) Arthritis ,    Abdominal   Peds  Hematology  (+) Blood dyscrasia, anemia   Anesthesia Other Findings   Reproductive/Obstetrics                              Anesthesia Physical Anesthesia Plan  ASA: 2  Anesthesia Plan: General   Post-op Pain Management:    Induction: Intravenous  PONV Risk Score and Plan: 3 and Ondansetron and Dexamethasone  Airway Management Planned: Oral ETT  Additional Equipment:   Intra-op Plan:   Post-operative Plan: Extubation in OR  Informed Consent: I have reviewed the patients History and Physical, chart, labs and discussed the procedure including the risks, benefits and alternatives for the proposed anesthesia with the patient or authorized representative who has indicated his/her understanding and acceptance.     Dental advisory given  Plan Discussed with: CRNA  Anesthesia Plan Comments:          Anesthesia Quick Evaluation

## 2023-07-24 NOTE — Op Note (Signed)
Operative Note  Pre-operative Diagnosis:  primary hyperparathyroidism  Post-operative Diagnosis:  same  Surgeon:  Darnell Level, MD  Assistant:  Reatha Harps, MD   Procedure:  neck exploration with right inferior parathyroidectomy  Anesthesia:  general  Estimated Blood Loss:  minimal  Drains: none         Specimen: parathyroid adenoma to pathology  Indications:  Patient returns for follow-up having undergone an extensive evaluation for primary hyperparathyroidism. Imaging studies included an ultrasound of the neck, a nuclear medicine parathyroid scan with sestamibi, and a 4D CT scan of the neck with parathyroid protocol. All of the studies failed to indicate the presence of a parathyroid adenoma. However, the patient has laboratory studies showing an elevated calcium level of 11.3, and intact PTH level of 113, and a normal vitamin D level of 32.8. 24-hour urine collection for calcium was markedly elevated at 548. Patient now comes to surgery for neck exploration and parathyroidectomy.   Procedure:  The patient was seen in the pre-op holding area. The risks, benefits, complications, treatment options, and expected outcomes were previously discussed with the patient. The patient agreed with the proposed plan and has signed the informed consent form.  The patient was brought to the operating room by the surgical team, identified as Gerrianne Scale and the procedure verified. A "time out" was completed and the above information confirmed.  Following administration of general anesthesia, the patient is positioned and then prepped and draped in the usual aseptic fashion.  After ascertaining that an adequate level of anesthesia been achieved, a small Kocher incision is made with a #15 blade.  Dissection was carried through subcutaneous tissues and platysma.  Hemostasis is achieved with the electrocautery.  Skin flaps are elevated cephalad and caudad from the thyroid notch to the sternal notch.   Self-retaining retractors are placed for exposure.  Strap muscles are incised in the midline.  Strap muscles are reflected to the left exposing the left thyroid lobe which appeared grossly normal.  Left lobe was gently mobilized.  Vascular tributaries were divided between small ligaclips with the harmonic scalpel.  Dissection superiorly revealed a normal-appearing left superior parathyroid gland on the posterior aspect of the superior pole of the left thyroid lobe.  This was left in situ.  Further dissection revealed the left inferior thyroid artery.  Dissection along the capsule of the thyroid and down to the left thyroid thymic tract failed to reveal any evidence of enlarged parathyroid tissue.  Dry pack was placed in the left neck.  Next we addressed the right side.  Strap muscles were again reflected laterally.  Right thyroid lobe was mobilized.  Right superior pole was dissected out.  Dissection was carried posteriorly to the precervical fascia.  No evidence of enlarged parathyroid tissue was noted in the right superior position.  Inferior thyroid artery was identified.  Dissection along the thyroid capsule and inferiorly revealed no evidence of enlarged parathyroid tissue.  However there appeared to be a nodular density in the superior aspect of the right thyroid thymic tract.  This was gently dissected out.  An enlarged parathyroid gland extended down into the right thyroid thymic tract.  It was completely mobilized and excised with vascular pedicle divided between small ligaclips with the harmonic scalpel.  Gland measured approximately 3 cm in length.  It was submitted to pathology where frozen section confirmed hypercellular parathyroid tissue consistent with parathyroid adenoma weighing over 600 mg.  Neck was irrigated with warm saline.  Good hemostasis was achieved  throughout the operative field.  Fibrillar was placed throughout the operative field.  Strap muscles are reapproximated in the midline  of interrupted 3-0 Vicryl sutures.  Platysma was closed with interrupted 3-0 Vicryl sutures.  Skin edges are closed with a running 4-0 Monocryl subcuticular suture.  Wound was washed and dried and Dermabond is applied as dressing.  Patient is awakened from anesthesia and transported to the recovery room in stable condition.  The patient tolerated the procedure well.   Darnell Level, MD [facility] Surgery Office: [phone]

## 2023-07-24 NOTE — Interval H&P Note (Signed)
History and Physical Interval Note:  07/24/2023 1:51 PM  [name]  has presented today for surgery, with the diagnosis of PRIMARY HYPERPARATHYROIDISM.  The various methods of treatment have been discussed with the patient and family. After consideration of risks, benefits and other options for treatment, the patient has consented to    Procedure(s): NECK EXPLORATION WITH PARATHYROIDECTOMY (N/A) as a surgical intervention.    The patient's history has been reviewed, patient examined, no change in status, stable for surgery.  I have reviewed the patient's chart and labs.  Questions were answered to the patient's satisfaction.    Darnell Level, MD [facility] practice Office: [phone]   Darnell Level

## 2023-07-25 ENCOUNTER — Encounter (HOSPITAL_COMMUNITY): Payer: Self-pay | Admitting: Surgery

## 2023-07-25 ENCOUNTER — Other Ambulatory Visit (HOSPITAL_COMMUNITY): Payer: Self-pay

## 2023-07-25 DIAGNOSIS — E210 Primary hyperparathyroidism: Secondary | ICD-10-CM | POA: Diagnosis not present

## 2023-07-25 LAB — CALCIUM: Calcium: 9.1 mg/dL (ref 8.9–10.3)

## 2023-07-25 MED ORDER — RYBELSUS 7 MG PO TABS
7.0000 mg | ORAL_TABLET | Freq: Every day | ORAL | 2 refills | Status: DC
  Filled 2023-07-25: qty 30, 30d supply, fill #0
  Filled 2023-08-19 – 2023-08-21 (×2): qty 30, 30d supply, fill #1

## 2023-07-25 MED ORDER — TRAMADOL HCL 50 MG PO TABS
50.0000 mg | ORAL_TABLET | Freq: Four times a day (QID) | ORAL | 0 refills | Status: DC | PRN
Start: 2023-07-25 — End: 2023-09-11
  Filled 2023-07-25: qty 12, 3d supply, fill #0

## 2023-07-25 NOTE — Progress Notes (Signed)
Pt in shower - ride will be here in 30 minutes. Pt to p/u meds at [facility] outpatient pharmacy

## 2023-07-25 NOTE — Plan of Care (Signed)
  Problem: Clinical Measurements: Goal: Ability to maintain clinical measurements within normal limits will improve Outcome: Progressing   Problem: Clinical Measurements: Goal: Will remain free from infection Outcome: Progressing   Problem: Clinical Measurements: Goal: Respiratory complications will improve Outcome: Progressing   Problem: Clinical Measurements: Goal: Cardiovascular complication will be avoided Outcome: Progressing   

## 2023-07-25 NOTE — Discharge Summary (Signed)
    Physician Discharge Summary   Patient ID: [name] MRN: [id number] DOB/AGE: 1971-02-01 52 y.o.  Admit date: 07/24/2023  Discharge date: 07/25/2023  Discharge Diagnoses:  Principal Problem:   Hyperparathyroidism, primary [facility]) Active Problems:   Primary hyperparathyroidism [facility])   Discharged Condition: good  Hospital Course: Patient was admitted for observation following parathyroid surgery.  Post op course was uncomplicated.  Pain was well controlled.  Tolerated diet.  Post op calcium level on morning following surgery was 9.1 mg/dl.  Patient was prepared for discharge home on POD#1.  Consults: None  Treatments: surgery: neck exploration with parathyroidectomy  Discharge Exam: Blood pressure (!) 147/97, pulse (!) 59, temperature 97.8 F (36.6 C), temperature source Oral, resp. rate 18, height 5\' 3"  (1.6 m), weight 98 kg, last menstrual period 05/29/2023, SpO2 97%. HEENT - clear Neck - wound dry and intact; mild STS; voice normal; Dermabond in place  Disposition: Home  Discharge Instructions     Diet - low sodium heart healthy   Complete by: As directed    Increase activity slowly   Complete by: As directed    No dressing needed   Complete by: As directed       Allergies as of 07/25/2023       Reactions   Iron Shortness Of Breath   IV only    Tysabri [natalizumab] Other (See Comments)   Shortness of breath, joint pain, tremors   Delsym [dextromethorphan Polistirex Er] Other (See Comments)   nightmares   Dextromethorphan Other (See Comments)   nightmares        Medication List     TAKE these medications    amLODipine 5 MG tablet Commonly known as: NORVASC Take 1 tablet (5 mg total) by mouth daily.   Bariatric Fusion Chew Chew 1 each by mouth daily.   BIOTIN PO Place 1 mL under the tongue daily. With Collagen (15000 mg per drop)   L-Lysine 1000 MG Tabs Take 1,000 mg by mouth daily.   Ocrevus 300 MG/10ML injection Generic drug:  ocrelizumab Inject 20 mLs (600 mg total) into the vein See admin instructions. Every 6 months   pantoprazole 40 MG tablet Commonly known as: PROTONIX Take 40 mg by mouth daily.   traMADol 50 MG tablet Commonly known as: ULTRAM Take 1 tablet (50 mg total) by mouth every 6 (six) hours as needed for moderate pain.   valACYclovir 1000 MG tablet Commonly known as: VALTREX Take one tablet by mouth 2 times daily. Start with first symptom of outbreak.               Discharge Care Instructions  (From admission, onward)           Start     Ordered   07/25/23 0000  No dressing needed        07/25/23 2725            Follow-up Information     Darnell Level, MD. Schedule an appointment as soon as possible for a visit in 3 week(s).   Specialty: General Surgery Why: For wound re-check Contact information: [address] [place] [address] [phone]                 Darnell Level, MD [facility] Office: [phone]   Signed: Darnell Level 07/25/2023, 8:59 AM

## 2023-07-25 NOTE — Progress Notes (Signed)
   07/25/23 0844  TOC Brief Assessment  Insurance and Status Reviewed  Patient has primary care physician Yes  Home environment has been reviewed Resides with children  Prior level of function: Independent at baseline  Prior/Current Home Services No current home services  Social Determinants of Health Reivew SDOH reviewed no interventions necessary  Readmission risk has been reviewed Yes  Transition of care needs no transition of care needs at this time

## 2023-07-25 NOTE — Discharge Instructions (Signed)

## 2023-07-26 LAB — SURGICAL PATHOLOGY

## 2023-08-02 ENCOUNTER — Encounter: Payer: Self-pay | Admitting: Nurse Practitioner

## 2023-08-02 ENCOUNTER — Ambulatory Visit (INDEPENDENT_AMBULATORY_CARE_PROVIDER_SITE_OTHER): Payer: Medicare Other | Admitting: Nurse Practitioner

## 2023-08-02 VITALS — BP 133/89 | HR 73 | Temp 98.2°F | Ht 63.5 in | Wt 215.0 lb

## 2023-08-02 DIAGNOSIS — I10 Essential (primary) hypertension: Secondary | ICD-10-CM

## 2023-08-02 DIAGNOSIS — Z0289 Encounter for other administrative examinations: Secondary | ICD-10-CM

## 2023-08-02 DIAGNOSIS — G4733 Obstructive sleep apnea (adult) (pediatric): Secondary | ICD-10-CM

## 2023-08-02 DIAGNOSIS — E119 Type 2 diabetes mellitus without complications: Secondary | ICD-10-CM | POA: Diagnosis not present

## 2023-08-02 DIAGNOSIS — Z6837 Body mass index (BMI) 37.0-37.9, adult: Secondary | ICD-10-CM

## 2023-08-02 DIAGNOSIS — Z9884 Bariatric surgery status: Secondary | ICD-10-CM

## 2023-08-02 DIAGNOSIS — Z7984 Long term (current) use of oral hypoglycemic drugs: Secondary | ICD-10-CM

## 2023-08-02 NOTE — Progress Notes (Signed)
Office: [phone]  /  Fax: [phone]   Initial Visit  [name] was seen in clinic today to evaluate for obesity. She is interested in losing weight to improve overall health and reduce the risk of weight related complications. She presents today to review program treatment options, initial physical assessment, and evaluation.     She was referred by: Specialist  When asked what else they would like to accomplish? She states: Improve energy levels and physical activity, Improve existing medical conditions, Improve quality of life, and Lose a target amount of weight : Goal weight:  175-180 lbs  Weight history:  She has struggled with her weight since she was a child.  Her weight has fluctuated over the years.   Bariatric surgery:  Patient is s/p SG on 12/13/21 by Dr. Daphine Deutscher.  Her highest weight prior to surgery was 315 lbs and her nadir weight after surgery was 208 lbs.  She is taking a bariatric multivitamin, Vit D, iron  When asked how has your weight affected you? She states: Contributed to orthopedic problems or mobility issues, Having fatigue, and Having poor endurance  Some associated conditions: HTN, migraines, OSAS on CPAP, hyperparathyroidism, MS, GERD, DMT2, anemia, celiac disease, DDD  Contributing factors: Family history, Nutritional, Medications, Stress, Reduced physical activity, Life event, and Pregnancy  Weight promoting medications identified: Steroids  Current nutrition plan: None  Current level of physical activity: Walking  Current or previous pharmacotherapy: Currently on Rybelsus 7mg  prescribed by PCP  Response to medication: denies side effects   Past medical history includes:   Past Medical History:  Diagnosis Date   Allergy    Anemia 2008   Anxiety    Arthritis    Celiac disease    Depression    GERD (gastroesophageal reflux disease)    Hypertension    Iron overload, transfusional    Migraine    MS (multiple sclerosis) (HCC)    OSA on  CPAP    C-PAP   Pre-diabetes    Sickle cell trait (HCC)    Tachycardia      Objective:   BP 133/89   Pulse 73   Temp 98.2 F (36.8 C)   Ht 5' 3.5" (1.613 m)   Wt 215 lb (97.5 kg)   LMP 05/29/2023 (Approximate) Comment: Urine POCT negative 07-24-23  SpO2 98%   BMI 37.49 kg/m  She was weighed on the bioimpedance scale: Body mass index is 37.49 kg/m.  Peak Weight:315 lbs , Body Fat%:45.9, Visceral Fat Rating:13, Weight trend over the last 12 months: Increasing  General:  Alert, oriented and cooperative. Patient is in no acute distress.  Respiratory: Normal respiratory effort, no problems with respiration noted   Gait: able to ambulate independently  Mental Status: Normal mood and affect. Normal behavior. Normal judgment and thought content.   DIAGNOSTIC DATA REVIEWED:  BMET    Component Value Date/Time   NA 137 07/12/2023 1026   NA 140 01/10/2023 1516   K 3.3 (L) 07/12/2023 1026   CL 107 07/12/2023 1026   CO2 23 07/12/2023 1026   GLUCOSE 104 (H) 07/12/2023 1026   BUN 10 07/12/2023 1026   BUN 12 01/10/2023 1516   CREATININE 0.55 07/12/2023 1026   CREATININE 0.75 11/11/2015 0828   CALCIUM 9.1 07/25/2023 0452   GFRNONAA >60 07/12/2023 1026   GFRNONAA >89 11/11/2015 0828   GFRAA 90 01/01/2019 1614   GFRAA >89 11/11/2015 0828   Lab Results  Component Value Date   HGBA1C 5.8 (H)  03/17/2018   HGBA1C 5.9 11/30/2012   No results found for: "INSULIN" CBC    Component Value Date/Time   WBC 7.6 07/12/2023 1026   RBC 5.33 (H) 07/12/2023 1026   HGB 11.1 (L) 07/12/2023 1026   HGB 11.3 01/10/2023 1516   HGB 12.9 08/12/2011 0756   HCT 37.7 07/12/2023 1026   HCT 39.2 01/10/2023 1516   HCT 39.4 08/12/2011 0756   PLT 309 07/12/2023 1026   PLT 279 01/10/2023 1516   MCV 70.7 (L) 07/12/2023 1026   MCV 69 (L) 01/10/2023 1516   MCV 79.6 08/12/2011 0756   MCH 20.8 (L) 07/12/2023 1026   MCHC 29.4 (L) 07/12/2023 1026   RDW 19.6 (H) 07/12/2023 1026   RDW 21.9 (H) 01/10/2023  1516   RDW 14.4 08/12/2011 0756   Iron/TIBC/Ferritin/ %Sat    Component Value Date/Time   IRON 34 (L) 12/03/2019 0935   TIBC 278 05/31/2011 1115   FERRITIN 7.6 (L) 12/03/2019 0935   IRONPCTSAT 7.4 (L) 12/03/2019 0935   IRONPCTSAT 30 05/31/2011 1115   Lipid Panel     Component Value Date/Time   CHOL 168 03/17/2018 1405   TRIG 84 03/17/2018 1405   HDL 44 03/17/2018 1405   CHOLHDL 3.8 03/17/2018 1405   CHOLHDL 4.5 11/11/2015 0828   VLDL 24 11/11/2015 0828   LDLCALC 107 (H) 03/17/2018 1405   Hepatic Function Panel     Component Value Date/Time   PROT 6.8 07/12/2023 1026   PROT 6.8 01/10/2023 1516   ALBUMIN 3.8 07/12/2023 1026   ALBUMIN 4.5 01/10/2023 1516   AST 20 07/12/2023 1026   ALT 19 07/12/2023 1026   ALKPHOS 94 07/12/2023 1026   BILITOT 0.4 07/12/2023 1026   BILITOT 0.3 01/10/2023 1516      Component Value Date/Time   TSH 0.985 03/17/2018 1405     Assessment and Plan:   Type 2 diabetes mellitus without complication, without long-term current use of insulin (HCC) Continue to follow-up with PCP.  Continue medications as directed.  Primary hypertension Continue follow-up with PCP.  Continue medications as directed.  Status post laparoscopic sleeve gastrectomy Will obtain labs needed at next visit.  Obstructive sleep apnea syndrome Continue CPAP nightly.  Morbid obesity (HCC)  BMI 37.0-37.9, adult        Obesity Treatment / Action Plan:  Patient will work on garnering support from family and friends to begin weight loss journey. Will work on eliminating or reducing the presence of highly palatable, calorie dense foods in the home. Will complete provided nutritional and psychosocial assessment questionnaire before the next appointment. Will be scheduled for indirect calorimetry to determine resting energy expenditure in a fasting state.  This will allow [place] to create a reduced calorie, high-protein meal plan to promote loss of fat mass while preserving  muscle mass. Counseled on the health benefits of losing 5%-15% of total body weight. Was counseled on nutritional approaches to weight loss and benefits of reducing processed foods and consuming plant-based foods and high quality protein as part of nutritional weight management. Was counseled on pharmacotherapy and role as an adjunct in weight management.   Obesity Education Performed Today:  She was weighed on the bioimpedance scale and results were discussed and documented in the synopsis.  We discussed obesity as a disease and the importance of a more detailed evaluation of all the factors contributing to the disease.  We discussed the importance of long term lifestyle changes which include nutrition, exercise and behavioral modifications as well  as the importance of customizing this to her specific health and social needs.  We discussed the benefits of reaching a healthier weight to alleviate the symptoms of existing conditions and reduce the risks of the biomechanical, metabolic and psychological effects of obesity.  Gerrianne Scale appears to be in the action stage of change and states they are ready to start intensive lifestyle modifications and behavioral modifications.  30 minutes was spent today on this visit including the above counseling, pre-visit chart review, and post-visit documentation.  Reviewed by clinician on day of visit: allergies, medications, problem list, medical history, surgical history, family history, social history, and previous encounter notes pertinent to obesity diagnosis.    Theodis Sato Jashira Cotugno FNP-C

## 2023-08-09 DIAGNOSIS — Z0289 Encounter for other administrative examinations: Secondary | ICD-10-CM

## 2023-08-17 ENCOUNTER — Ambulatory Visit: Payer: Medicare Other | Admitting: Bariatrics

## 2023-08-19 ENCOUNTER — Other Ambulatory Visit (HOSPITAL_COMMUNITY): Payer: Self-pay

## 2023-08-21 ENCOUNTER — Other Ambulatory Visit (HOSPITAL_COMMUNITY): Payer: Self-pay

## 2023-08-22 ENCOUNTER — Other Ambulatory Visit: Payer: Self-pay

## 2023-08-22 ENCOUNTER — Other Ambulatory Visit (HOSPITAL_COMMUNITY): Payer: Self-pay

## 2023-08-22 ENCOUNTER — Encounter: Payer: Self-pay | Admitting: Pharmacist

## 2023-08-23 ENCOUNTER — Encounter: Payer: Self-pay | Admitting: Bariatrics

## 2023-08-23 ENCOUNTER — Ambulatory Visit: Payer: Medicare Other | Admitting: Bariatrics

## 2023-08-23 VITALS — BP 123/83 | HR 68 | Temp 98.3°F | Ht 63.5 in | Wt 212.0 lb

## 2023-08-23 DIAGNOSIS — R5383 Other fatigue: Secondary | ICD-10-CM | POA: Diagnosis not present

## 2023-08-23 DIAGNOSIS — D500 Iron deficiency anemia secondary to blood loss (chronic): Secondary | ICD-10-CM | POA: Insufficient documentation

## 2023-08-23 DIAGNOSIS — R0602 Shortness of breath: Secondary | ICD-10-CM

## 2023-08-23 DIAGNOSIS — I10 Essential (primary) hypertension: Secondary | ICD-10-CM | POA: Diagnosis not present

## 2023-08-23 DIAGNOSIS — E559 Vitamin D deficiency, unspecified: Secondary | ICD-10-CM

## 2023-08-23 DIAGNOSIS — E119 Type 2 diabetes mellitus without complications: Secondary | ICD-10-CM

## 2023-08-23 DIAGNOSIS — Z1331 Encounter for screening for depression: Secondary | ICD-10-CM | POA: Diagnosis not present

## 2023-08-23 DIAGNOSIS — Z6836 Body mass index (BMI) 36.0-36.9, adult: Secondary | ICD-10-CM

## 2023-08-23 DIAGNOSIS — Z7984 Long term (current) use of oral hypoglycemic drugs: Secondary | ICD-10-CM

## 2023-08-23 DIAGNOSIS — Z9884 Bariatric surgery status: Secondary | ICD-10-CM

## 2023-08-23 DIAGNOSIS — K900 Celiac disease: Secondary | ICD-10-CM | POA: Insufficient documentation

## 2023-08-23 DIAGNOSIS — E66812 Obesity, class 2: Secondary | ICD-10-CM

## 2023-08-23 DIAGNOSIS — G4733 Obstructive sleep apnea (adult) (pediatric): Secondary | ICD-10-CM

## 2023-08-23 DIAGNOSIS — E669 Obesity, unspecified: Secondary | ICD-10-CM | POA: Insufficient documentation

## 2023-08-24 LAB — COMPREHENSIVE METABOLIC PANEL
ALT: 14 [IU]/L (ref 0–32)
AST: 18 [IU]/L (ref 0–40)
Albumin: 4.5 g/dL (ref 3.8–4.9)
Alkaline Phosphatase: 110 [IU]/L (ref 44–121)
BUN/Creatinine Ratio: 15 (ref 9–23)
BUN: 10 mg/dL (ref 6–24)
Bilirubin Total: 0.3 mg/dL (ref 0.0–1.2)
CO2: 21 mmol/L (ref 20–29)
Calcium: 9.8 mg/dL (ref 8.7–10.2)
Chloride: 104 mmol/L (ref 96–106)
Creatinine, Ser: 0.68 mg/dL (ref 0.57–1.00)
Globulin, Total: 2.6 g/dL (ref 1.5–4.5)
Glucose: 88 mg/dL (ref 70–99)
Potassium: 4.1 mmol/L (ref 3.5–5.2)
Sodium: 142 mmol/L (ref 134–144)
Total Protein: 7.1 g/dL (ref 6.0–8.5)
eGFR: 104 mL/min/{1.73_m2} (ref >59)

## 2023-08-24 LAB — LIPID PANEL WITH LDL/HDL RATIO
Cholesterol, Total: 174 mg/dL (ref 100–199)
HDL: 66 mg/dL (ref >39)
LDL Chol Calc (NIH): 93 mg/dL (ref 0–99)
LDL/HDL Ratio: 1.4 {ratio} (ref 0.0–3.2)
Triglycerides: 82 mg/dL (ref 0–149)
VLDL Cholesterol Cal: 15 mg/dL (ref 5–40)

## 2023-08-24 LAB — CBC WITH DIFFERENTIAL/PLATELET
Basophils Absolute: 0 10*3/uL (ref 0.0–0.2)
Basos: 1 %
EOS (ABSOLUTE): 0.1 10*3/uL (ref 0.0–0.4)
Eos: 2 %
Hematocrit: 44.6 % (ref 34.0–46.6)
Hemoglobin: 12.9 g/dL (ref 11.1–15.9)
Immature Grans (Abs): 0 10*3/uL (ref 0.0–0.1)
Immature Granulocytes: 0 %
Lymphocytes Absolute: 1.6 10*3/uL (ref 0.7–3.1)
Lymphs: 22 %
MCH: 21.5 pg — ABNORMAL LOW (ref 26.6–33.0)
MCHC: 28.9 g/dL — ABNORMAL LOW (ref 31.5–35.7)
MCV: 74 fL — ABNORMAL LOW (ref 79–97)
Monocytes Absolute: 0.5 10*3/uL (ref 0.1–0.9)
Monocytes: 8 %
Neutrophils Absolute: 4.9 10*3/uL (ref 1.4–7.0)
Neutrophils: 67 %
Platelets: 243 10*3/uL (ref 150–450)
RBC: 6 x10E6/uL — ABNORMAL HIGH (ref 3.77–5.28)
RDW: 21.3 % — ABNORMAL HIGH (ref 11.7–15.4)
WBC: 7.2 10*3/uL (ref 3.4–10.8)

## 2023-08-24 LAB — HEMOGLOBIN A1C
Est. average glucose Bld gHb Est-mCnc: 114 mg/dL
Hgb A1c MFr Bld: 5.6 % (ref 4.8–5.6)

## 2023-08-24 LAB — VITAMIN D 25 HYDROXY (VIT D DEFICIENCY, FRACTURES): Vit D, 25-Hydroxy: 33.6 ng/mL (ref 30.0–100.0)

## 2023-08-24 LAB — VITAMIN B12: Vitamin B-12: 720 pg/mL (ref 232–1245)

## 2023-08-24 LAB — INSULIN, RANDOM: INSULIN: 8.9 u[IU]/mL (ref 2.6–24.9)

## 2023-08-24 LAB — TSH: TSH: 0.692 u[IU]/mL (ref 0.450–4.500)

## 2023-08-28 NOTE — Progress Notes (Signed)
Chief Complaint:   OBESITY DASIAH [name] (MR# [id number]) is a 52 y.o. female who presents for evaluation and treatment of obesity and related comorbidities. Current BMI is Body mass index is 36.97 kg/m. [name] has been struggling with her weight for many years and has been unsuccessful in either losing weight, maintaining weight loss, or reaching her healthy weight goal.  [name] is currently in the action stage of change and ready to dedicate time achieving and maintaining a healthier weight. [name] is interested in becoming our patient and working on intensive lifestyle modifications including (but not limited to) diet and exercise for weight loss.  Patient met with Judeth Cornfield, nurse practitioner on 08/02/2023.  Onyinyechi's habits were reviewed today and are as follows: Her family eats meals together, her desired weight loss is 32-37 lbs, she has been heavy most of her life, she started gaining excessive weight at 52 years old, her heaviest weight ever was 315 pounds, she has significant food cravings issues, she snacks frequently in the evenings, and she skips meals frequently.  Depression Screen Danaja's Food and Mood (modified PHQ-9) score was 6.  Subjective:   1. Other fatigue [name] admits to daytime somnolence and denies waking up still tired. Patient has a history of symptoms of daytime fatigue. [name] generally gets 4 or 5 hours of sleep per night, and states that she has generally restful sleep. Snoring is present. Apneic episodes are present. Epworth Sleepiness Score is 6.   2. SOB (shortness of breath) on exertion [name] notes increasing shortness of breath with exercising and seems to be worsening over time with weight gain. She notes getting out of breath sooner with activity than she used to. This has not gotten worse recently. [name] denies shortness of breath at rest or orthopnea.  3. Essential hypertension Patient is taking amlodipine, and her blood pressure is  controlled.  4. Obstructive sleep apnea syndrome Patient is wearing her CPAP, and she notes she sleeps good.  5. Celiac disease Per the patient, she was diagnosed in 2021.  6. Type 2 diabetes mellitus without complication, without long-term current use of insulin (HCC) Patient is taking Rybelsus 7 mg daily.  7. Vitamin D deficiency Patient is taking vitamin D supplementation.  8. Status post laparoscopic sleeve gastrectomy Patient is status post gastric sleeve in January 2023.  She has no significant restriction.  Her lowest weight was 198 pounds, and her highest weight was 315 pounds.  9. Iron deficiency anemia due to chronic blood loss Patient has a history of heavy bleeding, and she is taking iron pills.  Assessment/Plan:   1. Other fatigue [name] does feel that her weight is causing her energy to be lower than it should be. Fatigue may be related to obesity, depression or many other causes. Labs will be ordered, and in the meanwhile, [name] will focus on self care including making healthy food choices, increasing physical activity and focusing on stress reduction.  - TSH  2. SOB (shortness of breath) on exertion [name] does feel that she gets out of breath more easily that she used to when she exercises. Saraiyah's shortness of breath appears to be obesity related and exercise induced. She has agreed to work on weight loss and gradually increase exercise to treat her exercise induced shortness of breath. Will continue to monitor closely.  - TSH  3. Essential hypertension We will check labs today.  Patient will continue her medications as directed.  - Hemoglobin A1c - Insulin, random -  Vitamin B12 - Lipid Panel With LDL/HDL Ratio - VITAMIN D 25 Hydroxy (Vit-D Deficiency, Fractures) - TSH - Comprehensive metabolic panel - CBC with Differential/Platelet  4. Obstructive sleep apnea syndrome Patient will continue with her CPAP compliance nightly.  - Hemoglobin A1c -  Insulin, random - Vitamin B12 - Lipid Panel With LDL/HDL Ratio - VITAMIN D 25 Hydroxy (Vit-D Deficiency, Fractures) - TSH - Comprehensive metabolic panel - CBC with Differential/Platelet  5. Celiac disease Patient will avoid foods that aggravate her symptoms.  6. Type 2 diabetes mellitus without complication, without long-term current use of insulin (HCC) We will check labs today.  Patient will continue her medications as directed.  - Hemoglobin A1c - Insulin, random  7. Vitamin D deficiency We will check labs today.  Patient will continue her vitamin D supplementation.  - VITAMIN D 25 Hydroxy (Vit-D Deficiency, Fractures)  8. Status post laparoscopic sleeve gastrectomy We will check labs today.  Patient will continue taking multivitamins and iron supplementation.  - Hemoglobin A1c - Insulin, random - Vitamin B12 - Lipid Panel With LDL/HDL Ratio - VITAMIN D 25 Hydroxy (Vit-D Deficiency, Fractures) - TSH - Comprehensive metabolic panel - CBC with Differential/Platelet  9. Iron deficiency anemia due to chronic blood loss We will check labs today, and patient will continue with her iron supplementation.  - CBC with Differential/Platelet  10. Depression screening [name] had a positive depression screening. Depression is commonly associated with obesity and often results in emotional eating behaviors. We will monitor this closely and work on CBT to help improve the non-hunger eating patterns. Referral to Psychology may be required if no improvement is seen as she continues in our clinic.  11. Generalized obesity We will check labs today, and we will follow-up at patient's next office visit.   - Hemoglobin A1c - Insulin, random - Vitamin B12 - Lipid Panel With LDL/HDL Ratio - VITAMIN D 25 Hydroxy (Vit-D Deficiency, Fractures) - TSH - Comprehensive metabolic panel - CBC with Differential/Platelet  12. Class 2 severe obesity due to excess calories with serious  comorbidity and body mass index (BMI) of 36.0 to 36.9 in adult [facility]) [name] is currently in the action stage of change and her goal is to continue with weight loss efforts. I recommend [name] begin the structured treatment plan as follows:  She has agreed to the Category 2 Plan.  Meal planning and intentional eating were discussed.  Reviewed labs with the patient from 04/21/2023 ferritin, B12, and lipids.  Patient is to decrease or eliminate gluten products.  Exercise goals: Walk 7,000 steps per day.    Behavioral modification strategies: increasing lean protein intake, decreasing simple carbohydrates, increasing vegetables, increasing water intake, decreasing eating out, no skipping meals, meal planning and cooking strategies, keeping healthy foods in the home, and planning for success.  She was informed of the importance of frequent follow-up visits to maximize her success with intensive lifestyle modifications for her multiple health conditions. She was informed we would discuss her lab results at her next visit unless there is a critical issue that needs to be addressed sooner. [name] agreed to keep her next visit at the agreed upon time to discuss these results.  Objective:   Blood pressure 123/83, pulse 68, temperature 98.3 F (36.8 C), height 5' 3.5" (1.613 m), weight 212 lb (96.2 kg), SpO2 99%. Body mass index is 36.97 kg/m.  EKG: Normal sinus rhythm, rate 63 BPM.  Indirect Calorimeter completed today shows a VO2 of 235 and a REE of  1627.  Her calculated basal metabolic rate is 1610 thus her basal metabolic rate is better than expected.  General: Cooperative, alert, well developed, in no acute distress. HEENT: Conjunctivae and lids unremarkable. Cardiovascular: Regular rhythm.  Lungs: Normal work of breathing. Neurologic: No focal deficits.   Lab Results  Component Value Date   CREATININE 0.68 08/23/2023   BUN 10 08/23/2023   NA 142 08/23/2023   K 4.1 08/23/2023   CL 104  08/23/2023   CO2 21 08/23/2023   Lab Results  Component Value Date   ALT 14 08/23/2023   AST 18 08/23/2023   ALKPHOS 110 08/23/2023   BILITOT 0.3 08/23/2023   Lab Results  Component Value Date   HGBA1C 5.6 08/23/2023   HGBA1C 5.8 (H) 03/17/2018   HGBA1C 5.9 (H) 03/28/2017   HGBA1C 6.0 11/11/2015   HGBA1C 6.0 03/05/2015   Lab Results  Component Value Date   INSULIN 8.9 08/23/2023   Lab Results  Component Value Date   TSH 0.692 08/23/2023   Lab Results  Component Value Date   CHOL 174 08/23/2023   HDL 66 08/23/2023   LDLCALC 93 08/23/2023   TRIG 82 08/23/2023   CHOLHDL 3.8 03/17/2018   Lab Results  Component Value Date   WBC 7.2 08/23/2023   HGB 12.9 08/23/2023   HCT 44.6 08/23/2023   MCV 74 (L) 08/23/2023   PLT 243 08/23/2023   Lab Results  Component Value Date   IRON 34 (L) 12/03/2019   TIBC 278 05/31/2011   FERRITIN 7.6 (L) 12/03/2019   Attestation Statements:   Reviewed by clinician on day of visit: allergies, medications, problem list, medical history, surgical history, family history, social history, and previous encounter notes.   Trude Mcburney, am acting as Energy manager for [employer], DO.  I have reviewed the above documentation for accuracy and completeness, and I agree with the above. Corinna Capra, DO

## 2023-08-30 ENCOUNTER — Encounter: Payer: Self-pay | Admitting: Obstetrics and Gynecology

## 2023-08-30 ENCOUNTER — Ambulatory Visit (INDEPENDENT_AMBULATORY_CARE_PROVIDER_SITE_OTHER): Payer: Medicare Other | Admitting: Obstetrics and Gynecology

## 2023-08-30 ENCOUNTER — Other Ambulatory Visit (HOSPITAL_COMMUNITY): Payer: Self-pay

## 2023-08-30 ENCOUNTER — Other Ambulatory Visit (HOSPITAL_COMMUNITY)
Admission: RE | Admit: 2023-08-30 | Discharge: 2023-08-30 | Disposition: A | Discharge status: Home / Self Care | Payer: Medicare Other | Source: Ambulatory Visit | Attending: Obstetrics and Gynecology | Admitting: Obstetrics and Gynecology

## 2023-08-30 VITALS — BP 110/70 | HR 73 | Ht 62.0 in | Wt 215.0 lb

## 2023-08-30 DIAGNOSIS — D219 Benign neoplasm of connective and other soft tissue, unspecified: Secondary | ICD-10-CM

## 2023-08-30 DIAGNOSIS — Z124 Encounter for screening for malignant neoplasm of cervix: Secondary | ICD-10-CM | POA: Diagnosis not present

## 2023-08-30 DIAGNOSIS — N920 Excessive and frequent menstruation with regular cycle: Secondary | ICD-10-CM

## 2023-08-30 DIAGNOSIS — Z01419 Encounter for gynecological examination (general) (routine) without abnormal findings: Secondary | ICD-10-CM | POA: Diagnosis present

## 2023-08-30 DIAGNOSIS — Z1151 Encounter for screening for human papillomavirus (HPV): Secondary | ICD-10-CM | POA: Insufficient documentation

## 2023-08-30 DIAGNOSIS — N951 Menopausal and female climacteric states: Secondary | ICD-10-CM | POA: Diagnosis not present

## 2023-08-30 DIAGNOSIS — Z1211 Encounter for screening for malignant neoplasm of colon: Secondary | ICD-10-CM

## 2023-08-30 DIAGNOSIS — B9689 Other specified bacterial agents as the cause of diseases classified elsewhere: Secondary | ICD-10-CM

## 2023-08-30 DIAGNOSIS — N760 Acute vaginitis: Secondary | ICD-10-CM

## 2023-08-30 DIAGNOSIS — B009 Herpesviral infection, unspecified: Secondary | ICD-10-CM

## 2023-08-30 DIAGNOSIS — Z9189 Other specified personal risk factors, not elsewhere classified: Secondary | ICD-10-CM

## 2023-08-30 DIAGNOSIS — Z9884 Bariatric surgery status: Secondary | ICD-10-CM

## 2023-08-30 DIAGNOSIS — Z1231 Encounter for screening mammogram for malignant neoplasm of breast: Secondary | ICD-10-CM

## 2023-08-30 NOTE — Progress Notes (Signed)
52 y.o. y.o. female here for annual exam. She has periods q month that are heavy and first couple of days painful, but she is not bothered by them She denies any vaginal dryness but has occasional times she is hot at night She is sexually active Works at [employer] and is on leave for vetebral surgery for MS Also with h/o gastric bypass and complete thyroidectomy  Patient's last menstrual period was 07/24/2023 (exact date). Period Duration (Days): 7-10 Period Pattern: Regular Menstrual Flow: Heavy Menstrual Control: Maxi pad Dysmenorrhea: (!) Moderate Dysmenorrhea Symptoms: Cramping, Diarrhea  Pelvic discharge: + reports history of recurrent BV Last mammogram: 8/23 birads 0 had additional imaging that was benign Last colonoscopy: not done Pap smear: reports last one in 2019. Denies any abnormal pap smears  Blood pressure 110/70, pulse 73, height 5\' 2"  (1.575 m), weight 215 lb (97.5 kg), last menstrual period 07/24/2023, SpO2 99%.  No results found for: "DIAGPAP", "HPVHIGH", "ADEQPAP"  GYN HISTORY: No results found for: "DIAGPAP", "HPVHIGH", "ADEQPAP"  OB History  Gravida Para Term Preterm AB Living  3         3  SAB IAB Ectopic Multiple Live Births               # Outcome Date GA Lbr Len/2nd Weight Sex Type Anes PTL Lv  3 Gravida           2 Gravida           1 [place]             Past Medical History:  Diagnosis Date   Allergy    Anemia 2008   Anxiety    Arthritis    Back pain    Celiac disease    Chest pain    Constipation    Depression    GERD (gastroesophageal reflux disease)    High cholesterol    Hypertension    Hypothyroidism    Iron overload, transfusional    Joint pain    Lactose intolerance    Migraine    MS (multiple sclerosis) (HCC)    OSA on CPAP    C-PAP   Pre-diabetes    Sickle cell trait (HCC)    Swelling of lower extremity    Tachycardia    Vitamin D deficiency     Past Surgical History:  Procedure Laterality Date   CERVICAL  ABLATION     CESAREAN SECTION     3 times   CHOLECYSTECTOMY  1990   HIATAL HERNIA REPAIR N/A 12/13/2021   Procedure: HERNIA REPAIR HIATAL;  Surgeon: Luretha Murphy, MD;  Location: [facility];  Service: General;  Laterality: N/A;   LAPAROSCOPIC GASTRIC SLEEVE RESECTION WITH HIATAL HERNIA REPAIR  12/13/2021   LASER ABLATION OF THE CERVIX     PARATHYROIDECTOMY N/A 07/24/2023   Procedure: NECK EXPLORATION WITH PARATHYROIDECTOMY;  Surgeon: Darnell Level, MD;  Location: [facility];  Service: General;  Laterality: N/A;   TUBAL LIGATION  1995   UPPER GI ENDOSCOPY N/A 12/13/2021   Procedure: UPPER GI ENDOSCOPY;  Surgeon: Luretha Murphy, MD;  Location: [facility];  Service: General;  Laterality: N/A;    Current Outpatient Medications on File Prior to Visit  Medication Sig Dispense Refill   amLODipine (NORVASC) 5 MG tablet Take 1 tablet (5 mg total) by mouth daily. 90 tablet 3   BIOTIN PO Place 1 mL under the tongue daily. With Collagen (15000 mg per drop)     calcium carbonate (TUMS EX)  750 MG chewable tablet Chew 1 tablet by mouth daily.     cholecalciferol (VITAMIN D3) 25 MCG (1000 UNIT) tablet Take 1,000 Units by mouth daily.     IRON, FERROUS GLUCONATE, PO Take by mouth.     Multiple Vitamins-Minerals (BARIATRIC FUSION) CHEW Chew 1 each by mouth daily.     ocrelizumab (OCREVUS) 300 MG/10ML injection Inject 20 mLs (600 mg total) into the vein See admin instructions. Every 6 months 20 mL 0   pantoprazole (PROTONIX) 40 MG tablet Take 40 mg by mouth daily.     Semaglutide (RYBELSUS) 7 MG TABS Take 1 tablet (7 mg total) by mouth daily. 30 tablet 2   valACYclovir (VALTREX) 1000 MG tablet Take one tablet by mouth 2 times daily. Start with first symptom of outbreak. 14 tablet 0   L-Lysine 1000 MG TABS Take 1,000 mg by mouth daily. (Patient not taking: Reported on 08/30/2023)     traMADol (ULTRAM) 50 MG tablet Take 1 tablet (50 mg total) by mouth every 6 (six) hours as needed for moderate pain. (Patient not taking:  Reported on 08/30/2023) 12 tablet 0   No current facility-administered medications on file prior to visit.    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, answers calls    Employer: [employer]    Comment: [employer]  Tobacco Use   Smoking status: Former    Current packs/day: 0.00    Average packs/day: 0.5 packs/day for 25.0 years (12.5 ttl pk-yrs)    Types: Cigarettes    Start date: 09/16/1996    Quit date: 09/16/2021    Years since quitting: 1.9   Smokeless tobacco: Never   Tobacco comments:    12/25/18  quit 2 months ago  Vaping Use   Vaping status: Some Days  Substance and Sexual Activity   Alcohol use: Yes    Alcohol/week: 0.0 standard drinks of alcohol    Comment: once maybe a month if that   Drug use: No   Sexual activity: Yes    Partners: Male  Other Topics Concern   Not on file  Social History Narrative   Patient lives at home with her family.   Social Determinants of Health   Financial Resource Strain: Low Risk  (04/10/2023)   Received from [facility], [facility]   Overall Financial Resource Strain (CARDIA)    Difficulty of Paying Living Expenses: Not very hard  Food Insecurity: Patient Declined (07/24/2023)   Hunger Vital Sign    Worried About Running Out of Food in the Last Year: Patient declined    Ran Out of Food in the Last Year: Patient declined  Transportation Needs: Patient Declined (07/24/2023)   PRAPARE - Administrator, Civil Service (Medical): Patient declined    Lack of Transportation (Non-Medical): Patient declined  Physical Activity: Insufficiently Active (04/10/2023)   Received from [facility], [facility]   Exercise Vital Sign    Days of Exercise per Week: 4 days    Minutes of Exercise per Session: 30 min   Stress: No Stress Concern Present (04/10/2023)   Received from [facility], [facility] - Occupational Stress Questionnaire    Feeling of Stress : Only a little  Social Connections:  Somewhat Isolated (04/10/2023)   Received from [facility], [facility]   Social Network    How would you rate your social network (family, work, friends)?: Restricted participation with some degree of social isolation  Intimate Partner Violence: Patient Declined (07/24/2023)   Humiliation, Afraid, Rape, and Kick questionnaire    Fear of Current or Ex-Partner: Patient declined    Emotionally Abused: Patient declined    Physically Abused: Patient declined    Sexually Abused: Patient declined    Family History  Problem Relation Age of Onset   Mental illness Mother    Hyperlipidemia Mother    Hyperthyroidism Mother    Mental retardation Mother    Other Mother        Covid   Sudden death Mother    Thyroid disease Mother    Depression Mother    Anxiety disorder Mother    Bipolar disorder Mother    Schizophrenia Mother    Diabetes Father    Cancer Father    Sudden death Father    Kidney disease Father    Liver disease Father    Arthritis Maternal Grandmother    Diabetes Maternal Grandmother    Hearing loss Maternal Grandmother        left hear when she was a child   Hyperlipidemia Maternal Grandmother    Hypertension Maternal Grandmother    Alcohol abuse Maternal Grandfather    Early death Maternal Grandfather    ADD / ADHD Son    Cancer Paternal Aunt        breast   Cancer Paternal Aunt        breast   Multiple sclerosis Cousin    Breast cancer Neg Hx    Colon cancer Neg Hx    Esophageal cancer Neg Hx    Inflammatory bowel disease Neg Hx    Pancreatic cancer Neg Hx    Stomach cancer Neg Hx    Rectal cancer Neg Hx    Sleep apnea Neg Hx      Allergies  Allergen Reactions   Iron Shortness Of Breath    IV only    Tysabri [Natalizumab]  Other (See Comments)    Shortness of breath, joint pain, tremors   Delsym [Dextromethorphan Polistirex Er] Other (See Comments)    nightmares   Dextromethorphan Other (See Comments)    nightmares      Patient's last menstrual period was Patient's last menstrual period was 07/24/2023 (exact date)..             Review of Systems Alls systems reviewed and are negative.     Physical Exam Genitourinary:     Vaginal discharge present.     Uterus is irregular.       A:         Well Woman GYN exam                             P:        Pap smear collected today Encouraged annual mammogram screening Colon cancer screening referral placed today Possible fibroids on exam today: to get TV US and fsh estradiol Labs and immunizations to do with PMD Discussed breast self exams Encouraged healthy lifestyle practices Encouraged Vit D and Calcium   No follow-ups on file.  Earley Favor

## 2023-08-31 ENCOUNTER — Ambulatory Visit: Payer: Medicare Other | Admitting: Bariatrics

## 2023-08-31 LAB — SURESWAB® ADVANCED VAGINITIS PLUS,TMA
C. trachomatis RNA, TMA: NOT DETECTED
CANDIDA SPECIES: NOT DETECTED
Candida glabrata: NOT DETECTED
N. gonorrhoeae RNA, TMA: NOT DETECTED
SURESWAB(R) ADV BACTERIAL VAGINOSIS(BV),TMA: NEGATIVE
TRICHOMONAS VAGINALIS (TV),TMA: NOT DETECTED

## 2023-09-01 LAB — CYTOLOGY - PAP
Adequacy: ABSENT
Comment: NEGATIVE
Diagnosis: UNDETERMINED — AB
High risk HPV: NEGATIVE

## 2023-09-02 LAB — IRON,TIBC AND FERRITIN PANEL
%SAT: 28 % (ref 16–45)
Ferritin: 7 ng/mL — ABNORMAL LOW (ref 16–232)
Iron: 123 ug/dL (ref 45–160)
TIBC: 446 ug/dL (ref 250–450)

## 2023-09-02 LAB — LIPID PANEL
Cholesterol: 177 mg/dL (ref <200)
HDL: 62 mg/dL (ref >=50)
LDL Cholesterol (Calc): 98 mg/dL
Non-HDL Cholesterol (Calc): 115 mg/dL (ref <130)
Total CHOL/HDL Ratio: 2.9 (calc) (ref <5.0)
Triglycerides: 78 mg/dL (ref <150)

## 2023-09-02 LAB — VITAMIN D 1,25 DIHYDROXY
Vitamin D 1, 25 (OH)2 Total: 82 pg/mL — ABNORMAL HIGH (ref 18–72)
Vitamin D2 1, 25 (OH)2: <8 pg/mL
Vitamin D3 1, 25 (OH)2: 82 pg/mL

## 2023-09-02 LAB — TSH: TSH: 0.56 m[IU]/L

## 2023-09-02 LAB — B12 AND FOLATE PANEL
Folate: >24 ng/mL
Vitamin B-12: 637 pg/mL (ref 200–1100)

## 2023-09-02 LAB — HEMOGLOBIN A1C
Hgb A1c MFr Bld: 5.5 %{Hb} (ref <5.7)
Mean Plasma Glucose: 111 mg/dL
eAG (mmol/L): 6.2 mmol/L

## 2023-09-02 LAB — FOLLICLE STIMULATING HORMONE: FSH: 51.3 m[IU]/mL

## 2023-09-02 LAB — ESTRADIOL: Estradiol: 28 pg/mL

## 2023-09-05 ENCOUNTER — Other Ambulatory Visit: Payer: Self-pay | Admitting: Physician Assistant

## 2023-09-05 DIAGNOSIS — Z1231 Encounter for screening mammogram for malignant neoplasm of breast: Secondary | ICD-10-CM

## 2023-09-06 ENCOUNTER — Ambulatory Visit: Payer: Medicare Other | Admitting: Bariatrics

## 2023-09-06 ENCOUNTER — Encounter: Payer: Self-pay | Admitting: Bariatrics

## 2023-09-06 VITALS — BP 126/82 | HR 73 | Temp 98.0°F | Ht 63.5 in | Wt 210.0 lb

## 2023-09-06 DIAGNOSIS — D649 Anemia, unspecified: Secondary | ICD-10-CM | POA: Insufficient documentation

## 2023-09-06 DIAGNOSIS — D508 Other iron deficiency anemias: Secondary | ICD-10-CM

## 2023-09-06 DIAGNOSIS — E66813 Obesity, class 3: Secondary | ICD-10-CM

## 2023-09-06 DIAGNOSIS — R7303 Prediabetes: Secondary | ICD-10-CM

## 2023-09-06 DIAGNOSIS — E669 Obesity, unspecified: Secondary | ICD-10-CM

## 2023-09-06 DIAGNOSIS — Z6841 Body Mass Index (BMI) 40.0 and over, adult: Secondary | ICD-10-CM | POA: Diagnosis not present

## 2023-09-07 NOTE — Progress Notes (Signed)
Chief Complaint:   OBESITY [name] is here to discuss her progress with her obesity treatment plan along with follow-up of her obesity related diagnoses. [name] is on the Category 2 Plan and states she is following her eating plan approximately 50% of the time. [name] states she is walking 8,000 steps 7 times per week.    Today's visit was #: 2 Starting weight: 212 lbs Starting date: 08/23/2023 Today's weight: 210 lbs Today's date: 09/06/2023 Total lbs lost to date: 2 Total lbs lost since last in-office visit: 2  Interim History: Patient is down 2 lbs since her last visit. She is skipping meals due to there being too much food on the plan.   Subjective:   1. Other iron deficiency anemia Patient is taking iron, and her ferritin is low.  2. Pre-diabetes Patient is not on medications.  Her last A1c was 5.5 and insulin 8.9, now in normal range.  Assessment/Plan:   1. Other iron deficiency anemia Patient will continue taking her iron supplementation and will increase her water intake.  2. Pre-diabetes Patient will continue working on decreasing carbohydrates and increasing protein.  3. Generalized obesity  4. Obesity, Class III, BMI 40-49.9 (morbid obesity) (HCC) [name] is currently in the action stage of change. As such, her goal is to continue with weight loss efforts. She has agreed to the Category 2 Plan and the Pescatarian Plan.   Reviewed labs with the patient from 08/23/2023; CMP, lipids, vitamin D, B12, CBC, A1c, glucose, insulin, and TSH.  Meal planning was discussed and dining out guide was given.  Exercise goals: As is.   Behavioral modification strategies: increasing lean protein intake, decreasing simple carbohydrates, increasing vegetables, increasing water intake, decreasing eating out, no skipping meals, meal planning and cooking strategies, keeping healthy foods in the home, and avoiding temptations.  [name] has agreed to follow-up with our clinic in 2  weeks. She was informed of the importance of frequent follow-up visits to maximize her success with intensive lifestyle modifications for her multiple health conditions.   Objective:   Blood pressure 126/82, pulse 73, temperature 98 F (36.7 C), height 5' 3.5" (1.613 m), weight 210 lb (95.3 kg), last menstrual period 07/24/2023, SpO2 100%. Body mass index is 36.62 kg/m.  General: Cooperative, alert, well developed, in no acute distress. HEENT: Conjunctivae and lids unremarkable. Cardiovascular: Regular rhythm.  Lungs: Normal work of breathing. Neurologic: No focal deficits.   Lab Results  Component Value Date   CREATININE 0.68 08/23/2023   BUN 10 08/23/2023   NA 142 08/23/2023   K 4.1 08/23/2023   CL 104 08/23/2023   CO2 21 08/23/2023   Lab Results  Component Value Date   ALT 14 08/23/2023   AST 18 08/23/2023   ALKPHOS 110 08/23/2023   BILITOT 0.3 08/23/2023   Lab Results  Component Value Date   HGBA1C 5.5 08/30/2023   HGBA1C 5.6 08/23/2023   HGBA1C 5.8 (H) 03/17/2018   HGBA1C 5.9 (H) 03/28/2017   HGBA1C 6.0 11/11/2015   Lab Results  Component Value Date   INSULIN 8.9 08/23/2023   Lab Results  Component Value Date   TSH 0.56 08/30/2023   Lab Results  Component Value Date   CHOL 177 08/30/2023   HDL 62 08/30/2023   LDLCALC 98 08/30/2023   TRIG 78 08/30/2023   CHOLHDL 2.9 08/30/2023   Lab Results  Component Value Date   VD25OH 33.6 08/23/2023   VD25OH 13.2 (L) 06/12/2018   VD25OH 24.3 (L)  03/28/2017   Lab Results  Component Value Date   WBC 7.2 08/23/2023   HGB 12.9 08/23/2023   HCT 44.6 08/23/2023   MCV 74 (L) 08/23/2023   PLT 243 08/23/2023   Lab Results  Component Value Date   IRON 123 08/30/2023   TIBC 446 08/30/2023   FERRITIN 7 (L) 08/30/2023   Attestation Statements:   Reviewed by clinician on day of visit: allergies, medications, problem list, medical history, surgical history, family history, social history, and previous encounter  notes.   Trude Mcburney, am acting as Energy manager for [employer], DO.  I have reviewed the above documentation for accuracy and completeness, and I agree with the above. Corinna Capra, DO

## 2023-09-11 ENCOUNTER — Encounter: Payer: Self-pay | Admitting: Bariatrics

## 2023-09-20 ENCOUNTER — Encounter: Payer: Self-pay | Admitting: Nurse Practitioner

## 2023-09-20 ENCOUNTER — Ambulatory Visit (INDEPENDENT_AMBULATORY_CARE_PROVIDER_SITE_OTHER): Payer: Medicare Other | Admitting: Nurse Practitioner

## 2023-09-20 VITALS — BP 128/85 | HR 69 | Temp 98.1°F | Ht 63.5 in | Wt 212.0 lb

## 2023-09-20 DIAGNOSIS — E669 Obesity, unspecified: Secondary | ICD-10-CM

## 2023-09-20 DIAGNOSIS — E119 Type 2 diabetes mellitus without complications: Secondary | ICD-10-CM | POA: Diagnosis not present

## 2023-09-20 DIAGNOSIS — Z6836 Body mass index (BMI) 36.0-36.9, adult: Secondary | ICD-10-CM

## 2023-09-20 DIAGNOSIS — Z7985 Long-term (current) use of injectable non-insulin antidiabetic drugs: Secondary | ICD-10-CM | POA: Diagnosis not present

## 2023-09-20 MED ORDER — INSULIN PEN NEEDLE 31G X 5 MM MISC
0 refills | Status: DC
Start: 2023-09-20 — End: 2024-03-07

## 2023-09-20 MED ORDER — OZEMPIC (0.25 OR 0.5 MG/DOSE) 2 MG/3ML ~~LOC~~ SOPN
0.2500 mg | PEN_INJECTOR | SUBCUTANEOUS | 0 refills | Status: DC
Start: 2023-09-20 — End: 2023-10-24

## 2023-09-20 NOTE — Addendum Note (Signed)
Addended by: Irene Limbo on: 09/20/2023 01:37 PM   Modules accepted: Level of Service

## 2023-09-20 NOTE — Progress Notes (Signed)
Office: [phone]  /  Fax: [phone]  WEIGHT SUMMARY AND BIOMETRICS  Weight Lost Since Last Visit: 0lb  Weight Gained Since Last Visit: 0lb   Vitals Temp: 98.1 F (36.7 C) BP: 128/85 Pulse Rate: 69 SpO2: 97 %   Anthropometric Measurements Height: 5' 3.5" (1.613 m) Weight: 212 lb (96.2 kg) BMI (Calculated): 36.96 Weight at Last Visit: 212lb Weight Lost Since Last Visit: 0lb Weight Gained Since Last Visit: 0lb Starting Weight: 212lb Total Weight Loss (lbs): 0 lb (0 kg)   Body Composition  Body Fat %: 46.9 % Fat Mass (lbs): 99.8 lbs Muscle Mass (lbs): 107.2 lbs Total Body Water (lbs): 79.2 lbs Visceral Fat Rating : 13   Other Clinical Data Fasting: No Labs: No Today's Visit #: 3 Starting Date: 08/23/23     HPI  Chief Complaint: OBESITY  [name] is here to discuss her progress with her obesity treatment plan. She is on the the Category 2 Plan and states she is following her eating plan approximately 50 % of the time. She states she is exercising 30-45 minutes 5-6 days per week.   Interval History:  Since last office visit she has maintained her weight.  She has been keeping her grandson more and hasn't been able to exercise as much as she has in the past. She has been sitting a lot more then she has in the past.   She feels that her clothes are fitting better.  She is eating 2-3 meals and 3 snack per day.  She snacks on cheese, crackers and popcorn. She is drinking water with flavoring and a protein shake daily.     Pharmacotherapy for weight loss: She is not currently taking medications  for medical weight loss.     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 Daphine Deutscher. Her highest weight prior to surgery was 315 lbs and her nadir weight after surgery was 208 lbs. She is taking a bariatric multivitamin, Vit D and iron.    Pharmacotherapy for DMT2:  She is currently taking Rybelsus  7mg .  Denies side effects.   Last A1c was 5.5 Had A1c of 6.8 on 01/27/23-has looked better on Rybelsus.   She is not checking BS at home.   Episodes of hypoglycemia: no On ACE or ARB, ASA 81mg  and statin.  Last eye exam:  never She has tried Metformin in the past.    Lab Results  Component Value Date   HGBA1C 5.5 08/30/2023   HGBA1C 5.6 08/23/2023   HGBA1C 5.8 (H) 03/17/2018   Lab Results  Component Value Date   LDLCALC 98 08/30/2023   CREATININE 0.68 08/23/2023     PHYSICAL EXAM:  Blood pressure 128/85, pulse 69, temperature 98.1 F (36.7 C), height 5' 3.5" (1.613 m), weight 212 lb (96.2 kg), last menstrual period 07/24/2023, SpO2 97%. Body mass index is 36.97 kg/m.  General: She is overweight, cooperative, alert, well developed, and in no acute distress. PSYCH: Has normal mood, affect and thought process.   Extremities: No edema.  Neurologic: No gross sensory or motor deficits. No tremors or fasciculations noted.    DIAGNOSTIC DATA REVIEWED:  BMET    Component Value Date/Time   NA 142 08/23/2023 0953   K 4.1 08/23/2023 0953   CL 104 08/23/2023 0953   CO2 21 08/23/2023 0953   GLUCOSE 88 08/23/2023 0953   GLUCOSE 104 (H) 07/12/2023 1026   BUN 10 08/23/2023 0953  CREATININE 0.68 08/23/2023 0953   CREATININE 0.75 11/11/2015 0828   CALCIUM 9.8 08/23/2023 0953   GFRNONAA >60 07/12/2023 1026   GFRNONAA >89 11/11/2015 0828   GFRAA 90 01/01/2019 1614   GFRAA >89 11/11/2015 0828   Lab Results  Component Value Date   HGBA1C 5.5 08/30/2023   HGBA1C 5.9 11/30/2012   Lab Results  Component Value Date   INSULIN 8.9 08/23/2023   Lab Results  Component Value Date   TSH 0.56 08/30/2023   CBC    Component Value Date/Time   WBC 7.2 08/23/2023 0953   WBC 7.6 07/12/2023 1026   RBC 6.00 (H) 08/23/2023 0953   RBC 5.33 (H) 07/12/2023 1026   HGB 12.9 08/23/2023 0953   HGB 12.9 08/12/2011 0756   HCT 44.6 08/23/2023 0953   HCT 39.4 08/12/2011 0756   PLT 243  08/23/2023 0953   MCV 74 (L) 08/23/2023 0953   MCV 79.6 08/12/2011 0756   MCH 21.5 (L) 08/23/2023 0953   MCH 20.8 (L) 07/12/2023 1026   MCHC 28.9 (L) 08/23/2023 0953   MCHC 29.4 (L) 07/12/2023 1026   RDW 21.3 (H) 08/23/2023 0953   RDW 14.4 08/12/2011 0756   Iron Studies    Component Value Date/Time   IRON 123 08/30/2023 1128   TIBC 446 08/30/2023 1128   FERRITIN 7 (L) 08/30/2023 1128   IRONPCTSAT 28 08/30/2023 1128   Lipid Panel     Component Value Date/Time   CHOL 177 08/30/2023 1128   CHOL 174 08/23/2023 0953   TRIG 78 08/30/2023 1128   HDL 62 08/30/2023 1128   HDL 66 08/23/2023 0953   CHOLHDL 2.9 08/30/2023 1128   VLDL 24 11/11/2015 0828   LDLCALC 98 08/30/2023 1128   Hepatic Function Panel     Component Value Date/Time   PROT 7.1 08/23/2023 0953   ALBUMIN 4.5 08/23/2023 0953   AST 18 08/23/2023 0953   ALT 14 08/23/2023 0953   ALKPHOS 110 08/23/2023 0953   BILITOT 0.3 08/23/2023 0953      Component Value Date/Time   TSH 0.56 08/30/2023 1128   Nutritional Lab Results  Component Value Date   VD25OH 33.6 08/23/2023   VD25OH 13.2 (L) 06/12/2018   VD25OH 24.3 (L) 03/28/2017     ASSESSMENT AND PLAN  TREATMENT PLAN FOR OBESITY:  Recommended Dietary Goals  [name] is currently in the action stage of change. As such, her goal is to continue weight management plan. She has agreed to the Category 2 Plan-I've encouraged her to track and I will review at her next visit.   Behavioral Intervention  We discussed the following Behavioral Modification Strategies today: increasing lean protein intake to established goals, increasing water intake , and continue to work on maintaining a reduced calorie state, getting the recommended amount of protein, incorporating whole foods, making healthy choices, staying well hydrated and practicing mindfulness when eating..  Additional resources provided today: NA  Recommended Physical Activity Goals  [name] has been advised  to work up to 150 minutes of moderate intensity aerobic activity a week and strengthening exercises 2-3 times per week for cardiovascular health, weight loss maintenance and preservation of muscle mass.   She has agreed to Continue current level of physical activity    ASSOCIATED CONDITIONS ADDRESSED TODAY  Action/Plan  Type 2 diabetes mellitus without complication, without long-term current use of insulin (HCC) -     Ozempic (0.25 or 0.5 MG/DOSE); Inject 0.25 mg into the skin once a week.  Dispense:  3 mL; Refill: 0 -     Insulin Pen Needle; Take as directed with Ozempic  Dispense: 100 each; Refill: 0  Stop Rybelsus   Generalized obesity  BMI 36.0-36.9,adult         Return in about 2 weeks (around 10/04/2023).Marland Kitchen She was informed of the importance of frequent follow up visits to maximize her success with intensive lifestyle modifications for her multiple health conditions.   ATTESTASTION STATEMENTS:  Reviewed by clinician on day of visit: allergies, medications, problem list, medical history, surgical history, family history, social history, and previous encounter notes.     Theodis Sato. Anthoney Sheppard FNP-C

## 2023-09-20 NOTE — Patient Instructions (Signed)

## 2023-09-28 ENCOUNTER — Ambulatory Visit
Admission: RE | Admit: 2023-09-28 | Discharge: 2023-09-28 | Disposition: A | Discharge status: Home / Self Care | Payer: Medicare Other | Source: Ambulatory Visit | Attending: Physician Assistant | Admitting: Physician Assistant

## 2023-09-28 DIAGNOSIS — Z1231 Encounter for screening mammogram for malignant neoplasm of breast: Secondary | ICD-10-CM

## 2023-10-05 ENCOUNTER — Ambulatory Visit: Payer: Medicare Other | Admitting: Nurse Practitioner

## 2023-10-05 ENCOUNTER — Encounter: Payer: Self-pay | Admitting: Nurse Practitioner

## 2023-10-05 VITALS — BP 142/88 | HR 79 | Temp 98.4°F | Ht 63.5 in | Wt 211.0 lb

## 2023-10-05 DIAGNOSIS — E669 Obesity, unspecified: Secondary | ICD-10-CM | POA: Diagnosis not present

## 2023-10-05 DIAGNOSIS — Z6836 Body mass index (BMI) 36.0-36.9, adult: Secondary | ICD-10-CM | POA: Diagnosis not present

## 2023-10-05 DIAGNOSIS — E119 Type 2 diabetes mellitus without complications: Secondary | ICD-10-CM

## 2023-10-05 DIAGNOSIS — Z7985 Long-term (current) use of injectable non-insulin antidiabetic drugs: Secondary | ICD-10-CM

## 2023-10-05 NOTE — Progress Notes (Signed)
Office: [phone]  /  Fax: [phone]  WEIGHT SUMMARY AND BIOMETRICS  Weight Lost Since Last Visit: 1lb  Weight Gained Since Last Visit: 0lb   Vitals Temp: 98.4 F (36.9 C) BP: (!) 142/88 Pulse Rate: 79 SpO2: 99 %   Anthropometric Measurements Height: 5' 3.5" (1.613 m) Weight: 211 lb (95.7 kg) BMI (Calculated): 36.79 Weight at Last Visit: 212lb Weight Lost Since Last Visit: 1lb Weight Gained Since Last Visit: 0lb Starting Weight: 212lb Total Weight Loss (lbs): 1 lb (0.454 kg)   Body Composition  Body Fat %: 45.9 % Fat Mass (lbs): 97 lbs Muscle Mass (lbs): 108.6 lbs Total Body Water (lbs): 79.6 lbs Visceral Fat Rating : 13   Other Clinical Data Fasting: Yes Labs: No Today's Visit #: 4 Starting Date: 08/23/23     HPI  Chief Complaint: OBESITY  [name] is here to discuss her progress with her obesity treatment plan. She is on the the Category 2 Plan and states she is following her eating plan approximately 90-100 % of the time. She states she is exercising 90-120 minutes 5-7 days per week.   Interval History:  Since last office visit she has lost 1 pound.  She is averaging around [phone] calories and 57-119 grams of protein. She has decreased her carb intake.  She is averaging around 2,000-12,000 steps per day.   Pharmacotherapy for weight loss: She is not currently taking medications  for medical weight loss.      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 Daphine Deutscher. Her highest weight prior to surgery was 315 lbs and her nadir weight after surgery was 208 lbs. She is taking a bariatric multivitamin, Vit D and iron.    Pharmacotherapy for DMT2:  She is currently taking Ozempic 0.25mg  (x 3 doses).  Denies side effects.   Last A1c was 5.5-doing well on GLP-1 She is not checking BS at home.   Episodes of hypoglycemia: no Not on ACE or ARB, ASA 81mg  and statin.  Last eye exam:  Oct  11th, 2024 She has tried Rybelsus and Metformin in the past.    Lab Results  Component Value Date   HGBA1C 5.5 08/30/2023   HGBA1C 5.6 08/23/2023   HGBA1C 5.8 (H) 03/17/2018   Lab Results  Component Value Date   LDLCALC 98 08/30/2023   CREATININE 0.68 08/23/2023    PHYSICAL EXAM:  Blood pressure (!) 142/88, pulse 79, temperature 98.4 F (36.9 C), height 5' 3.5" (1.613 m), weight 211 lb (95.7 kg), last menstrual period 07/24/2023, SpO2 99%. Body mass index is 36.79 kg/m.  General: She is overweight, cooperative, alert, well developed, and in no acute distress. PSYCH: Has normal mood, affect and thought process.   Extremities: No edema.  Neurologic: No gross sensory or motor deficits. No tremors or fasciculations noted.    DIAGNOSTIC DATA REVIEWED:  BMET    Component Value Date/Time   NA 142 08/23/2023 0953   K 4.1 08/23/2023 0953   CL 104 08/23/2023 0953   CO2 21 08/23/2023 0953   GLUCOSE 88 08/23/2023 0953   GLUCOSE 104 (H) 07/12/2023 1026   BUN 10 08/23/2023 0953   CREATININE 0.68 08/23/2023 0953   CREATININE 0.75 11/11/2015 0828   CALCIUM 9.8 08/23/2023 0953   GFRNONAA >60 07/12/2023 1026   GFRNONAA >89 11/11/2015 0828   GFRAA 90 01/01/2019 1614   GFRAA >89 11/11/2015 0828   Lab Results  Component Value  Date   HGBA1C 5.5 08/30/2023   HGBA1C 5.9 11/30/2012   Lab Results  Component Value Date   INSULIN 8.9 08/23/2023   Lab Results  Component Value Date   TSH 0.56 08/30/2023   CBC    Component Value Date/Time   WBC 7.2 08/23/2023 0953   WBC 7.6 07/12/2023 1026   RBC 6.00 (H) 08/23/2023 0953   RBC 5.33 (H) 07/12/2023 1026   HGB 12.9 08/23/2023 0953   HGB 12.9 08/12/2011 0756   HCT 44.6 08/23/2023 0953   HCT 39.4 08/12/2011 0756   PLT 243 08/23/2023 0953   MCV 74 (L) 08/23/2023 0953   MCV 79.6 08/12/2011 0756   MCH 21.5 (L) 08/23/2023 0953   MCH 20.8 (L) 07/12/2023 1026   MCHC 28.9 (L) 08/23/2023 0953   MCHC 29.4 (L) 07/12/2023 1026   RDW 21.3  (H) 08/23/2023 0953   RDW 14.4 08/12/2011 0756   Iron Studies    Component Value Date/Time   IRON 123 08/30/2023 1128   TIBC 446 08/30/2023 1128   FERRITIN 7 (L) 08/30/2023 1128   IRONPCTSAT 28 08/30/2023 1128   Lipid Panel     Component Value Date/Time   CHOL 177 08/30/2023 1128   CHOL 174 08/23/2023 0953   TRIG 78 08/30/2023 1128   HDL 62 08/30/2023 1128   HDL 66 08/23/2023 0953   CHOLHDL 2.9 08/30/2023 1128   VLDL 24 11/11/2015 0828   LDLCALC 98 08/30/2023 1128   Hepatic Function Panel     Component Value Date/Time   PROT 7.1 08/23/2023 0953   ALBUMIN 4.5 08/23/2023 0953   AST 18 08/23/2023 0953   ALT 14 08/23/2023 0953   ALKPHOS 110 08/23/2023 0953   BILITOT 0.3 08/23/2023 0953      Component Value Date/Time   TSH 0.56 08/30/2023 1128   Nutritional Lab Results  Component Value Date   VD25OH 33.6 08/23/2023   VD25OH 13.2 (L) 06/12/2018   VD25OH 24.3 (L) 03/28/2017     ASSESSMENT AND PLAN  TREATMENT PLAN FOR OBESITY:  Recommended Dietary Goals  Rossana is currently in the action stage of change. As such, her goal is to continue weight management plan. She has agreed to track and will review at next visit.   Needs to aim for 1200 calories and 75+ grams of protein.    Behavioral Intervention  We discussed the following Behavioral Modification Strategies today: continue to work on maintaining a reduced calorie state, getting the recommended amount of protein, incorporating whole foods, making healthy choices, staying well hydrated and practicing mindfulness when eating..  Additional resources provided today: NA  Recommended Physical Activity Goals  Kavia has been advised to work up to 150 minutes of moderate intensity aerobic activity a week and strengthening exercises 2-3 times per week for cardiovascular health, weight loss maintenance and preservation of muscle mass.   She has agreed to Continue current level of physical activity    ASSOCIATED  CONDITIONS ADDRESSED TODAY  Action/Plan  Type 2 diabetes mellitus without complication, without long-term current use of insulin (HCC) Continue Ozempic 0.25mg  x 1 more dose and then will increase to 0.5mg . Side effects disucssed  Generalized obesity  BMI 36.0-36.9,adult     BP is elevated today.  Patient is taking cold meds OTC.  Will continue to monitor.    Spent 30 mins with the patient today and with reviewing her chart after visit.   Return in about 2 weeks (around 10/19/2023).Marland Kitchen She was informed of the importance of  frequent follow up visits to maximize her success with intensive lifestyle modifications for her multiple health conditions.   ATTESTASTION STATEMENTS:  Reviewed by clinician on day of visit: allergies, medications, problem list, medical history, surgical history, family history, social history, and previous encounter notes.     Theodis Sato. Yvette Roark FNP-C

## 2023-10-15 ENCOUNTER — Other Ambulatory Visit: Payer: Self-pay | Admitting: Nurse Practitioner

## 2023-10-15 DIAGNOSIS — E119 Type 2 diabetes mellitus without complications: Secondary | ICD-10-CM

## 2023-10-19 ENCOUNTER — Other Ambulatory Visit: Payer: Self-pay | Admitting: Nurse Practitioner

## 2023-10-19 DIAGNOSIS — E119 Type 2 diabetes mellitus without complications: Secondary | ICD-10-CM

## 2023-10-24 ENCOUNTER — Encounter: Payer: Self-pay | Admitting: Nurse Practitioner

## 2023-10-24 ENCOUNTER — Ambulatory Visit (INDEPENDENT_AMBULATORY_CARE_PROVIDER_SITE_OTHER): Payer: 59 | Admitting: Nurse Practitioner

## 2023-10-24 VITALS — BP 122/76 | HR 68 | Temp 97.4°F | Ht 63.5 in | Wt 214.0 lb

## 2023-10-24 DIAGNOSIS — E119 Type 2 diabetes mellitus without complications: Secondary | ICD-10-CM

## 2023-10-24 DIAGNOSIS — Z6837 Body mass index (BMI) 37.0-37.9, adult: Secondary | ICD-10-CM | POA: Diagnosis not present

## 2023-10-24 DIAGNOSIS — E669 Obesity, unspecified: Secondary | ICD-10-CM | POA: Diagnosis not present

## 2023-10-24 DIAGNOSIS — Z7985 Long-term (current) use of injectable non-insulin antidiabetic drugs: Secondary | ICD-10-CM | POA: Diagnosis not present

## 2023-10-24 MED ORDER — OZEMPIC (0.25 OR 0.5 MG/DOSE) 2 MG/3ML ~~LOC~~ SOPN: 0.5000 mg | PEN_INJECTOR | SUBCUTANEOUS | 0 refills | Status: DC

## 2023-10-24 NOTE — Progress Notes (Signed)
Office: [phone]  /  Fax: [phone]  WEIGHT SUMMARY AND BIOMETRICS  Weight Lost Since Last Visit: 0  Weight Gained Since Last Visit: 3 lb   Vitals Temp: (!) 97.4 F (36.3 C) BP: 122/76 Pulse Rate: 68 SpO2: 100 %   Anthropometric Measurements Height: 5' 3.5" (1.613 m) Weight: 214 lb (97.1 kg) BMI (Calculated): 37.31 Weight at Last Visit: 211 lb Weight Lost Since Last Visit: 0 Weight Gained Since Last Visit: 3 lb Starting Weight: 212 l;b Total Weight Loss (lbs): 0 lb (0 kg)   Body Composition  Body Fat %: 47.7 % Fat Mass (lbs): 102.2 lbs Muscle Mass (lbs): 106.6 lbs Total Body Water (lbs): 83 lbs Visceral Fat Rating : 14   Other Clinical Data Fasting: no Labs: no Today's Visit #: 5 Starting Date: 08/23/23     HPI  Chief Complaint: OBESITY  [name] is here to discuss her progress with her obesity treatment plan. She is on the the Category 2 Plan and states she is following her eating plan approximately 90 % of the time. She states she is exercising 120 minutes 5 days per week.   Interval History:  Since last office visit she has gained 3 pounds.  She is frustrated with gaining weight.  Feels that she is doing well with following the meal plan.  She is averaging around 1019-1305 calories and 80-106 grams of protein.  She struggles with eating all her calories because she is not hungry.  She is drinking water with flavoring daily.  She is averaging around 3,000-4,300 steps per day.  She is going to the gym 5 days per week and is there for 2 hours.  She is walking on the treadmill 30-45 mins, bike 15-30 minutes, resistance training 20 reps for 4 different muscles and walks 3 times around the track.    Pharmacotherapy for weight loss: She is not currently taking medications  for medical weight loss.      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 Daphine Deutscher. Her highest weight prior to  surgery was 315 lbs and her nadir weight after surgery was 208 lbs. She is taking a bariatric multivitamin, Vit D and iron.    Pharmacotherapy for DMT2:  She is currently taking Ozempic 0.5 mg.  Denies side effects.   Last A1c was 5.5 on 08/30/23.  Had A1c of 6.8 on 01/27/23 She is not checking BS at home.   Episodes of hypoglycemia: no Not on ACE or ARB, ASA 81mg  or statin.  Last eye exam:  Oct 2024 She has tried Rybelsus and Metformin  in the past.    Lab Results  Component Value Date   HGBA1C 5.5 08/30/2023   HGBA1C 5.6 08/23/2023   HGBA1C 5.8 (H) 03/17/2018   Lab Results  Component Value Date   LDLCALC 98 08/30/2023   CREATININE 0.68 08/23/2023     PHYSICAL EXAM:  Blood pressure 122/76, pulse 68, temperature (!) 97.4 F (36.3 C), height 5' 3.5" (1.613 m), weight 214 lb (97.1 kg), last menstrual period 09/27/2023, SpO2 100%. Body mass index is 37.31 kg/m.  General: She is overweight, cooperative, alert, well developed, and in no acute distress. PSYCH: Has normal mood, affect and thought process.   Extremities: No edema.  Neurologic: No gross sensory or motor deficits. No tremors or fasciculations noted.    DIAGNOSTIC DATA REVIEWED:  BMET    Component Value Date/Time   NA  142 08/23/2023 0953   K 4.1 08/23/2023 0953   CL 104 08/23/2023 0953   CO2 21 08/23/2023 0953   GLUCOSE 88 08/23/2023 0953   GLUCOSE 104 (H) 07/12/2023 1026   BUN 10 08/23/2023 0953   CREATININE 0.68 08/23/2023 0953   CREATININE 0.75 11/11/2015 0828   CALCIUM 9.8 08/23/2023 0953   GFRNONAA >60 07/12/2023 1026   GFRNONAA >89 11/11/2015 0828   GFRAA 90 01/01/2019 1614   GFRAA >89 11/11/2015 0828   Lab Results  Component Value Date   HGBA1C 5.5 08/30/2023   HGBA1C 5.9 11/30/2012   Lab Results  Component Value Date   INSULIN 8.9 08/23/2023   Lab Results  Component Value Date   TSH 0.56 08/30/2023   CBC    Component Value Date/Time   WBC 7.2 08/23/2023 0953   WBC 7.6 07/12/2023 1026    RBC 6.00 (H) 08/23/2023 0953   RBC 5.33 (H) 07/12/2023 1026   HGB 12.9 08/23/2023 0953   HGB 12.9 08/12/2011 0756   HCT 44.6 08/23/2023 0953   HCT 39.4 08/12/2011 0756   PLT 243 08/23/2023 0953   MCV 74 (L) 08/23/2023 0953   MCV 79.6 08/12/2011 0756   MCH 21.5 (L) 08/23/2023 0953   MCH 20.8 (L) 07/12/2023 1026   MCHC 28.9 (L) 08/23/2023 0953   MCHC 29.4 (L) 07/12/2023 1026   RDW 21.3 (H) 08/23/2023 0953   RDW 14.4 08/12/2011 0756   Iron Studies    Component Value Date/Time   IRON 123 08/30/2023 1128   TIBC 446 08/30/2023 1128   FERRITIN 7 (L) 08/30/2023 1128   IRONPCTSAT 28 08/30/2023 1128   Lipid Panel     Component Value Date/Time   CHOL 177 08/30/2023 1128   CHOL 174 08/23/2023 0953   TRIG 78 08/30/2023 1128   HDL 62 08/30/2023 1128   HDL 66 08/23/2023 0953   CHOLHDL 2.9 08/30/2023 1128   VLDL 24 11/11/2015 0828   LDLCALC 98 08/30/2023 1128   Hepatic Function Panel     Component Value Date/Time   PROT 7.1 08/23/2023 0953   ALBUMIN 4.5 08/23/2023 0953   AST 18 08/23/2023 0953   ALT 14 08/23/2023 0953   ALKPHOS 110 08/23/2023 0953   BILITOT 0.3 08/23/2023 0953      Component Value Date/Time   TSH 0.56 08/30/2023 1128   Nutritional Lab Results  Component Value Date   VD25OH 33.6 08/23/2023   VD25OH 13.2 (L) 06/12/2018   VD25OH 24.3 (L) 03/28/2017     ASSESSMENT AND PLAN  TREATMENT PLAN FOR OBESITY:  Recommended Dietary Goals  [name] is currently in the action stage of change. As such, her goal is to continue weight management plan. She has agreed to to track and will review at next visit.  I've asked her to weigh and measure her food.    Behavioral Intervention  We discussed the following Behavioral Modification Strategies today: continue to work on maintaining a reduced calorie state, getting the recommended amount of protein, incorporating whole foods, making healthy choices, staying well hydrated and practicing mindfulness when  eating..  Additional resources provided today: NA  Recommended Physical Activity Goals  [name] has been advised to work up to 150 minutes of moderate intensity aerobic activity a week and strengthening exercises 2-3 times per week for cardiovascular health, weight loss maintenance and preservation of muscle mass.   She has agreed to Continue current level of physical activity    ASSOCIATED CONDITIONS ADDRESSED TODAY  Action/Plan  Type  2 diabetes mellitus without complication, without long-term current use of insulin (HCC) -     Ozempic (0.25 or 0.5 MG/DOSE); Inject 0.5 mg into the skin once a week.  Dispense: 3 mL; Refill: 0.  Side effects discussed  Will not increase Ozempic at this time.  Patient is struggling with meeting calorie goals.    Generalized obesity  BMI 37.0-37.9, adult         Return in about 2 weeks (around 11/07/2023).Marland Kitchen She was informed of the importance of frequent follow up visits to maximize her success with intensive lifestyle modifications for her multiple health conditions.   ATTESTASTION STATEMENTS:  Reviewed by clinician on day of visit: allergies, medications, problem list, medical history, surgical history, family history, social history, and previous encounter notes.   Time spent on visit including pre-visit chart review and post-visit care and charting was 50 minutes.    Theodis Sato. Siddalee Vanderheiden FNP-C

## 2023-11-07 ENCOUNTER — Encounter: Payer: Self-pay | Admitting: Family Medicine

## 2023-11-07 ENCOUNTER — Ambulatory Visit (INDEPENDENT_AMBULATORY_CARE_PROVIDER_SITE_OTHER): Payer: 59 | Admitting: Family Medicine

## 2023-11-07 VITALS — BP 132/85 | HR 71 | Temp 98.0°F | Ht 63.5 in | Wt 212.0 lb

## 2023-11-07 DIAGNOSIS — Z6836 Body mass index (BMI) 36.0-36.9, adult: Secondary | ICD-10-CM

## 2023-11-07 DIAGNOSIS — Z9884 Bariatric surgery status: Secondary | ICD-10-CM

## 2023-11-07 DIAGNOSIS — Z7985 Long-term (current) use of injectable non-insulin antidiabetic drugs: Secondary | ICD-10-CM

## 2023-11-07 DIAGNOSIS — E119 Type 2 diabetes mellitus without complications: Secondary | ICD-10-CM

## 2023-11-07 DIAGNOSIS — E66812 Obesity, class 2: Secondary | ICD-10-CM

## 2023-11-07 MED ORDER — OZEMPIC (1 MG/DOSE) 4 MG/3ML ~~LOC~~ SOPN: 1.0000 mg | PEN_INJECTOR | SUBCUTANEOUS | 1 refills | Status: DC

## 2023-11-07 NOTE — Progress Notes (Signed)
Office: [phone]  /  Fax: [phone]  WEIGHT SUMMARY AND BIOMETRICS  Starting Date: 08/23/23  Starting Weight: 212lb   Weight Lost Since Last Visit: 2lb   Vitals Temp: 98 F (36.7 C) BP: 132/85 Pulse Rate: 71 SpO2: 99 %   Body Composition  Body Fat %: 45.5 % Fat Mass (lbs): 96.4 lbs Muscle Mass (lbs): 109.8 lbs Total Body Water (lbs): 79.2 lbs Visceral Fat Rating : 13   HPI  Chief Complaint: OBESITY  [name] is here to discuss her progress with her obesity treatment plan. She is on the the Category 2 Plan and states she is following her eating plan approximately 90 % of the time. She states she is exercising 0 minutes 0 times per week.  Interval History:  Since last office visit she is down 2 lb She has been trying to stick to her meal plan and log her calorie intake [facility]- reduced from daily to 2-3 days/ wk Her MS has been flaring up with cold weather She reports adequate satiety with small portions from VSG + Ozempic 0.5 mg dose without GI upset She was getting in 'too many calories' when she started to log, reportedly up to 2500 cal/ day  Pharmacotherapy: Ozempic 0.5 mg weekly  PHYSICAL EXAM:  Blood pressure 132/85, pulse 71, temperature 98 F (36.7 C), height 5' 3.5" (1.613 m), weight 212 lb (96.2 kg), last menstrual period 09/27/2023, SpO2 99%. Body mass index is 36.97 kg/m.  General: She is overweight, cooperative, alert, well developed, and in no acute distress. PSYCH: Has normal mood, affect and thought process.   Lungs: Normal breathing effort, no conversational dyspnea.   ASSESSMENT AND PLAN  TREATMENT PLAN FOR OBESITY:  Recommended Dietary Goals  [name] is currently in the action stage of change. As such, her goal is to continue weight management plan. She has agreed to keeping a food journal and adhering to recommended goals of 1100- 1300 calories and 85-100 g  of  protein and following a lower carbohydrate, vegetable and lean protein rich diet plan.  Behavioral Intervention  We discussed the following Behavioral Modification Strategies today: increasing lean protein intake to established goals, increasing vegetables, increasing lower glycemic fruits, increasing fiber rich foods, increasing water intake , work on tracking and journaling calories using tracking application, keeping healthy foods at home, and continue to work on maintaining a reduced calorie state, getting the recommended amount of protein, incorporating whole foods, making healthy choices, staying well hydrated and practicing mindfulness when eating..  Additional resources provided today: NA  Recommended Physical Activity Goals  [name] has been advised to work up to 150 minutes of moderate intensity aerobic activity a week and strengthening exercises 2-3 times per week for cardiovascular health, weight loss maintenance and preservation of muscle mass.   She has agreed to Work on scheduling and tracking physical activity.   Pharmacotherapy changes for the treatment of obesity: Increase Ozempic 1 mg weekly  ASSOCIATED CONDITIONS ADDRESSED TODAY  Type 2 diabetes mellitus without complication, without long-term current use of insulin (HCC) -     Ozempic (1 MG/DOSE); Inject 1 mg into the skin once a week.  Dispense: 3 mL; Refill: 1  Class 2 severe obesity due to excess calories with serious comorbidity and body mass index (BMI) of 36.0 to 36.9 in adult [facility])  Status post laparoscopic sleeve gastrectomy    Reviewed nutrition change goals together on AVS, changing  kcal to 1100-1300 cal/ day to include 85-100 g of protein daily, reducing ultra processed foods, upping fruit to 2 servings per day and reducing bread, rice, pasta, cereal, crackers, sweets.   Cut out tic tacs.    She was informed of the importance of frequent follow up visits to maximize her success with intensive lifestyle  modifications for her multiple health conditions.   ATTESTASTION STATEMENTS:  Reviewed by clinician on day of visit: allergies, medications, problem list, medical history, surgical history, family history, social history, and previous encounter notes pertinent to obesity diagnosis.   I have personally spent 30 minutes total time today in preparation, patient care, nutritional counseling and documentation for this visit, including the following: review of clinical lab tests; review of medical tests/procedures/services.      Glennis Brink, DO DABFM, DABOM [facility] [address] [place], [place] [address] [phone]

## 2023-11-07 NOTE — Patient Instructions (Addendum)
Track daily caloric intake: Aim for 1100 cal/ day - 1300 cal/ day Your daily protein goal is 85-100 grams/ day Avoid high sugar foods and drinks Do not add your exercise into your calorie tracking ap  You can bump up protein intake by adding liquid egg white to whole eggs Add in greek yogurt, cottage cheese, a protein bar for snacks as needed  Cut out TIC TACS (swap out for sugar free gum 2-3 pieces per day) Cut back on rice, potatoes, pasta, bread You can have fresh fruit up to 2 servings per day  Listen to your body with workouts Focus on yoga, weights, bike on the days your MS is flaring Add more walking in on better days  [place] job with water and sleep!!

## 2023-11-29 ENCOUNTER — Ambulatory Visit: Payer: Medicare Other | Admitting: Obstetrics and Gynecology

## 2023-11-30 ENCOUNTER — Encounter: Payer: Self-pay | Admitting: Obstetrics and Gynecology

## 2023-11-30 ENCOUNTER — Ambulatory Visit: Payer: 59 | Admitting: Obstetrics and Gynecology

## 2023-11-30 VITALS — BP 118/82 | HR 82

## 2023-11-30 DIAGNOSIS — N898 Other specified noninflammatory disorders of vagina: Secondary | ICD-10-CM

## 2023-11-30 DIAGNOSIS — Z113 Encounter for screening for infections with a predominantly sexual mode of transmission: Secondary | ICD-10-CM

## 2023-11-30 DIAGNOSIS — N761 Subacute and chronic vaginitis: Secondary | ICD-10-CM | POA: Diagnosis not present

## 2023-11-30 MED ORDER — PHEXXI 1.8-1-0.4 % VA GEL: 1.0000 | Freq: Every evening | VAGINAL | 12 refills | Status: DC

## 2023-11-30 NOTE — Progress Notes (Signed)
   Acute Office Visit  Subjective:    Patient ID: [name], female    DOB: 11-May-1971, 53 y.o.   MRN: [id number]   HPI 53 y.o. presents today for Vaginitis (Pt c/o vaginal discharge with slight itching//jj) . Reports she has returned to her prior partner that was unfaithful and she reports using soap and is not sure if it is from the soap or the partner, but is having discharge. She has a history of recurrent BV as well and takes boric acid after.  Patient's last menstrual period was 11/10/2023 (exact date). Period Duration (Days): 7 Period Pattern: (!) Irregular Menstrual Flow: Heavy Menstrual Control: Maxi pad Dysmenorrhea: None  Review of Systems     Objective:    Physical Exam Genitourinary:     No vaginal prolapse present.    No vaginal atrophy present.    Cervical discharge present.     No cervical lesion.     BP 118/82   Pulse 82   LMP 11/10/2023 (Exact Date)   SpO2 98%  Wt Readings from Last 3 Encounters:  11/07/23 212 lb (96.2 kg)  10/24/23 214 lb (97.1 kg)  10/05/23 211 lb (95.7 kg)        Patient informed chaperone available to be present for breast and/or pelvic exam. Patient has requested no chaperone to be present. Patient has been advised what will be completed during breast and pelvic exam.   Assessment & Plan:  STI panel sent along with nuswab.  To notify patient of the results. To begin pheexi with intercourse to help balance vaginal pH and help reduce BV infections.   Earley Favor

## 2023-11-30 NOTE — Progress Notes (Signed)
Patient

## 2023-11-30 NOTE — Telephone Encounter (Signed)
Pt also LVM in triage line about this.

## 2023-12-01 ENCOUNTER — Encounter: Payer: Self-pay | Admitting: Obstetrics and Gynecology

## 2023-12-01 LAB — HIV ANTIBODY (ROUTINE TESTING W REFLEX): HIV 1&2 Ab, 4th Generation: NONREACTIVE

## 2023-12-01 LAB — HEPATITIS B SURFACE ANTIGEN: Hepatitis B Surface Ag: NONREACTIVE

## 2023-12-01 LAB — RPR: RPR Ser Ql: NONREACTIVE

## 2023-12-01 LAB — HEPATITIS C ANTIBODY: Hepatitis C Ab: NONREACTIVE

## 2023-12-02 LAB — SURESWAB® ADVANCED VAGINITIS PLUS,TMA
C. trachomatis RNA, TMA: NOT DETECTED
CANDIDA SPECIES: NOT DETECTED
Candida glabrata: NOT DETECTED
N. gonorrhoeae RNA, TMA: NOT DETECTED
SURESWAB(R) ADV BACTERIAL VAGINOSIS(BV),TMA: POSITIVE — AB
TRICHOMONAS VAGINALIS (TV),TMA: NOT DETECTED

## 2023-12-04 MED ORDER — METRONIDAZOLE 500 MG PO TABS: 500.0000 mg | ORAL_TABLET | Freq: Two times a day (BID) | ORAL | 0 refills | Status: AC

## 2023-12-04 MED ORDER — FLUCONAZOLE 150 MG PO TABS: 150.0000 mg | ORAL_TABLET | Freq: Once | ORAL | 0 refills | Status: AC

## 2023-12-05 LAB — TIQ-MISC

## 2023-12-06 ENCOUNTER — Encounter: Payer: Self-pay | Admitting: Obstetrics and Gynecology

## 2023-12-06 NOTE — Telephone Encounter (Signed)
GC was not run for some reason. She can run in for urine sample and we can run off this or come for a speculum exam. I am sorry Dr. Karma Greaser

## 2023-12-07 ENCOUNTER — Ambulatory Visit: Payer: 59 | Admitting: Family Medicine

## 2023-12-07 ENCOUNTER — Encounter: Payer: Self-pay | Admitting: Obstetrics and Gynecology

## 2023-12-07 DIAGNOSIS — N760 Acute vaginitis: Secondary | ICD-10-CM

## 2023-12-07 MED ORDER — DOXYCYCLINE HYCLATE 100 MG PO CAPS: 100.0000 mg | ORAL_CAPSULE | Freq: Two times a day (BID) | ORAL | 0 refills | Status: DC

## 2023-12-07 NOTE — Telephone Encounter (Signed)
The pt also left a VM in triage line regarding this.

## 2023-12-08 ENCOUNTER — Ambulatory Visit: Payer: 59 | Admitting: Podiatry

## 2023-12-08 ENCOUNTER — Encounter: Payer: Self-pay | Admitting: Podiatry

## 2023-12-08 VITALS — Ht 63.5 in

## 2023-12-08 DIAGNOSIS — Z79899 Other long term (current) drug therapy: Secondary | ICD-10-CM | POA: Diagnosis not present

## 2023-12-08 DIAGNOSIS — B351 Tinea unguium: Secondary | ICD-10-CM

## 2023-12-08 LAB — MYCOPLASMA / UREAPLASMA CULTURE

## 2023-12-08 NOTE — Progress Notes (Signed)
Subjective:  Patient ID: [name], female    DOB: [birth year],  MRN: [id number]  Chief Complaint  Patient presents with   Nail Problem    She would like the big toe nails on both feet.     53 y.o. female presents with the above complaint.  Patient presents with bilateral hallux thickened and dystrophic mycotic toenails x 2.  She wanted discuss treatment options for it.  She is topical medication and which has helped.  She would like to discuss treatment options for nail fungus.   Review of Systems: Negative except as noted in the HPI. Denies N/V/F/Ch.  Past Medical History:  Diagnosis Date   Allergy    Anemia 2008   Anxiety    Arthritis    Back pain    Celiac disease    Chest pain    Constipation    Depression    GERD (gastroesophageal reflux disease)    High cholesterol    Hypertension    Hypothyroidism    Iron overload, transfusional    Joint pain    Lactose intolerance    Migraine    MS (multiple sclerosis) (HCC)    OSA on CPAP    C-PAP   Pre-diabetes    Sickle cell trait (HCC)    Swelling of lower extremity    Tachycardia    Vitamin D deficiency     Current Outpatient Medications:    amLODipine (NORVASC) 5 MG tablet, Take 1 tablet (5 mg total) by mouth daily., Disp: 90 tablet, Rfl: 3   BIOTIN PO, Place 1 mL under the tongue daily. With Collagen (15000 mg per drop), Disp: , Rfl:    calcium carbonate (TUMS EX) 750 MG chewable tablet, Chew 1 tablet by mouth daily., Disp: , Rfl:    cholecalciferol (VITAMIN D3) 25 MCG (1000 UNIT) tablet, Take 1,000 Units by mouth daily., Disp: , Rfl:    doxycycline (VIBRAMYCIN) 100 MG capsule, Take 1 capsule (100 mg total) by mouth 2 (two) times daily for 14 days., Disp: 28 capsule, Rfl: 0   fluconazole (DIFLUCAN) 150 MG tablet, Take 150 mg by mouth once., Disp: , Rfl:    Insulin Pen Needle 31G X 5 MM MISC, Take as directed with Ozempic, Disp: 100 each, Rfl: 0   IRON, FERROUS GLUCONATE, PO, Take by mouth., Disp: , Rfl:     Lactic Ac-Citric Ac-Pot Bitart (PHEXXI) 1.8-1-0.4 % GEL, Place 1 suppository vaginally at bedtime. Place one hour PV before intercourse, Disp: 5 g, Rfl: 12   metroNIDAZOLE (FLAGYL) 500 MG tablet, Take 1 tablet (500 mg total) by mouth 2 (two) times daily for 7 days., Disp: 14 tablet, Rfl: 0   Multiple Vitamins-Minerals (BARIATRIC FUSION) CHEW, Chew 1 each by mouth daily., Disp: , Rfl:    ocrelizumab (OCREVUS) 300 MG/10ML injection, Inject 20 mLs (600 mg total) into the vein See admin instructions. Every 6 months, Disp: 20 mL, Rfl: 0   pantoprazole (PROTONIX) 40 MG tablet, Take 40 mg by mouth daily., Disp: , Rfl:    Semaglutide, 1 MG/DOSE, (OZEMPIC, 1 MG/DOSE,) 4 MG/3ML SOPN, Inject 1 mg into the skin once a week., Disp: 3 mL, Rfl: 1   valACYclovir (VALTREX) 1000 MG tablet, Take one tablet by mouth 2 times daily. Start with first symptom of outbreak., Disp: 14 tablet, Rfl: 0  Social History   Tobacco Use  Smoking Status Former   Current packs/day: 0.00   Average packs/day: 0.5 packs/day for 25.0 years (12.5 ttl pk-yrs)  Types: Cigarettes   Start date: 09/16/1996   Quit date: 09/16/2021   Years since quitting: 2.2  Smokeless Tobacco Never  Tobacco Comments   12/25/18  quit 2 months ago      Vape occ    Allergies  Allergen Reactions   Iron Shortness Of Breath    IV only    Tysabri [Natalizumab] Other (See Comments)    Shortness of breath, joint pain, tremors   Delsym [Dextromethorphan Polistirex Er] Other (See Comments)    nightmares   Dextromethorphan Other (See Comments)    nightmares   Nsaids     Other Reaction(s): Other  S/p bariatric surgery   Objective:  There were no vitals filed for this visit. Body mass index is 36.97 kg/m. Constitutional Well developed. Well nourished.  Vascular Dorsalis pedis pulses palpable bilaterally. Posterior tibial pulses palpable bilaterally. Capillary refill normal to all digits.  No cyanosis or clubbing noted. Pedal hair growth normal.   Neurologic Normal speech. Oriented to person, place, and time. Epicritic sensation to light touch grossly present bilaterally.  Dermatologic Nails thickened onychodystrophy mycotic toenails x 2 bilateral hallux Skin within normal limits  Orthopedic: Normal joint ROM without pain or crepitus bilaterally. No visible deformities. No bony tenderness.   Radiographs: None Assessment:   1. Long-term current use of high risk medication other than anticoagulant   2. Onychomycosis due to dermatophyte   3. Nail fungus    Plan:  Patient was evaluated and treated and all questions answered.  Bilateral hallux onychomycosis -Educated the patient on the etiology of onychomycosis and various treatment options associated with improving the fungal load.  I explained to the patient that there is 3 treatment options available to treat the onychomycosis including topical, p.o., laser treatment.  Patient elected to undergo p.o. options with Lamisil/terbinafine therapy.  In order for me to start the medication therapy, I explained to the patient the importance of evaluating the liver and obtaining the liver function test.  Once the liver function test comes back normal I will start him on 91-month course of Lamisil therapy.  Patient understood all risk and would like to proceed with Lamisil therapy.  I have asked the patient to immediately stop the Lamisil therapy if she has any reactions to it and call the office or go to the emergency room right away.  Patient states understanding   No follow-ups on file.

## 2023-12-11 NOTE — Addendum Note (Signed)
Addended by: Leda Min on: 12/11/2023 05:58 PM   Modules accepted: Orders

## 2023-12-11 NOTE — Telephone Encounter (Signed)
Jodi Favor, MD  [facility] Triage Can you verify if she started lamisil at the same time as doxy?  She will need to clear this medication with the podiatrist before taking or switching to azithro. Thank you Dr. Gerre Pebbles with patient. Patient has not started Lamisil, wants to wait until GYN concerns are resolved. Patient states she will still f/u with Podiatrist.   Doxycycline discontinued.   Will review and provide update to Dr. Karma Greaser.  Patient agreeable.   Dr. Karma Greaser -please advise on azithromycin and diflucan RX.

## 2023-12-12 ENCOUNTER — Ambulatory Visit (INDEPENDENT_AMBULATORY_CARE_PROVIDER_SITE_OTHER): Payer: 59 | Admitting: Diagnostic Neuroimaging

## 2023-12-12 ENCOUNTER — Encounter: Payer: Self-pay | Admitting: Diagnostic Neuroimaging

## 2023-12-12 VITALS — BP 143/95 | HR 70 | Ht 63.0 in | Wt 208.0 lb

## 2023-12-12 DIAGNOSIS — G35 Multiple sclerosis: Secondary | ICD-10-CM | POA: Diagnosis not present

## 2023-12-12 MED ORDER — FLUCONAZOLE 150 MG PO TABS: 150.0000 mg | ORAL_TABLET | Freq: Once | ORAL | 1 refills | Status: AC

## 2023-12-12 MED ORDER — AZITHROMYCIN 250 MG PO TABS: 250.0000 mg | ORAL_TABLET | Freq: Every day | ORAL | 0 refills | Status: AC

## 2023-12-12 NOTE — Progress Notes (Signed)
Chief Complaint  Patient presents with   Follow-up    Pt in 7 alone pt here for MS f/u Pt states no questions or concerns for today's visit     History of Present Illness:  UPDATE (12/12/23, VRP): Since last visit, doing well. Symptoms are stable to improved. Has lost weight since gastric sleeve in 2013; now on ozempic since 3 months. Overall doing well.  UPDATE (01/10/23, VRP): Since last visit, doing well. Symptoms are stable. Severity mild. No alleviating or aggravating factors. Tolerating ocrevus.    UPDATE (01/10/22, VRP): Since last visit, doing well. Had gastric sleeve surg in 12/13/21, and already lost 30lbs. Symptoms are stable overall. Some dizziness with standing.   UPDATE (06/14/21, VRP): Since last visit, doing well. Symptoms are stable overall, except right leg now hurting more. Left leg pain stable. Tolerating ocrevus.     UPDATE (11/10/20, VRP): Since last visit, doing about the same. Symptoms are stable. Severity is moderate. No alleviating or aggravating factors. Tolerating meds. Had some positional dizziness, but improved with hydration.  UPDATE (04/28/20, VRP): Since last visit, had to leave work due to long term disability expiring. Applying for medicaid. MS symptoms stable. HA stable. However, back and left leg pain are worsened. Not able to work because of low back pain and left leg pain, cannot sit for prolonged time and severe pain. Not driving right now. More depression.  PRIOR HPI:  - MS sxs are stable; tolerating ocrevus - left sciatica pain is stable; continues to struggle with pain mgmt; has tried [facility] without relief; now seeing chiropractor and doing better - mood, nutrition, exercise are improving - has been out of work since Feb 2020    Observations/Objective:  GENERAL EXAM/CONSTITUTIONAL: Vitals:  Vitals:   12/12/23 1421  BP: (!) 143/95  Pulse: 70  Weight: 208 lb (94.3 kg)  Height: 5\' 3"  (1.6 m)   Body mass index is 36.85 kg/m. Wt Readings from  Last 3 Encounters:  12/12/23 208 lb (94.3 kg)  11/07/23 212 lb (96.2 kg)  10/24/23 214 lb (97.1 kg)   Patient is in no distress; well developed, nourished and groomed; neck is supple  CARDIOVASCULAR: Examination of carotid arteries is normal; no carotid bruits Regular rate and rhythm, no murmurs Examination of peripheral vascular system by observation and palpation is normal  EYES: Ophthalmoscopic exam of optic discs and posterior segments is normal; no papilledema or hemorrhages No results found.  MUSCULOSKELETAL: Gait, strength, tone, movements noted in Neurologic exam below  NEUROLOGIC: MENTAL STATUS:      No data to display         awake, alert, oriented to person, place and time recent and remote memory intact normal attention and concentration language fluent, comprehension intact, naming intact fund of knowledge appropriate  CRANIAL NERVE:  2nd - no papilledema on fundoscopic exam 2nd, 3rd, 4th, 6th - pupils equal and reactive to light, visual fields full to confrontation, extraocular muscles intact, no nystagmus 5th - facial sensation symmetric 7th - facial strength symmetric 8th - hearing intact 9th - palate elevates symmetrically, uvula midline 11th - shoulder shrug symmetric 12th - tongue protrusion midline  MOTOR:  normal bulk and tone, full strength in the BUE BLE 4 prox, 5 distal  SENSORY:  normal and symmetric to light touch, temperature, vibration  COORDINATION:  finger-nose-finger, fine finger movements normal  REFLEXES:  deep tendon reflexes 1+ and symmetric  GAIT/STATION:  narrow based gait  Lab Results  Component Value Date  WBC 7.2 08/23/2023   HGB 12.9 08/23/2023   HCT 44.6 08/23/2023   MCV 74 (L) 08/23/2023   PLT 243 08/23/2023       Latest Ref Rng & Units 08/23/2023    9:53 AM 07/25/2023    4:52 AM 07/12/2023   10:26 AM  CMP  Glucose 70 - 99 mg/dL 88   161   BUN 6 - 24 mg/dL 10   10   Creatinine 0.96 - 1.00 mg/dL 0.45    4.09   Sodium 811 - 144 mmol/L 142   137   Potassium 3.5 - 5.2 mmol/L 4.1   3.3   Chloride 96 - 106 mmol/L 104   107   CO2 20 - 29 mmol/L 21   23   Calcium 8.7 - 10.2 mg/dL 9.8  9.1  91.4   Total Protein 6.0 - 8.5 g/dL 7.1   6.8   Total Bilirubin 0.0 - 1.2 mg/dL 0.3   0.4   Alkaline Phos 44 - 121 IU/L 110   94   AST 0 - 40 IU/L 18   20   ALT 0 - 32 IU/L 14   19      01/07/19 MRI brain  - No change since September of 2018. Multiple cerebral hemispheric white matter lesions consistent with chronic multiple sclerosis. No new or progressive lesions. No lesions show  restricted diffusion or contrast enhancement.  01/07/19 MRI lumbar spine - L4-5: Bilateral facet arthropathy with gaping, fluid-filled joints. Mild biforaminal disc bulges. No visible neural compression in this position. With standing or flexion, the patient could develop anterolisthesis, which could be significant. Facet arthropathy could certainly be a cause of back pain or referred facet syndrome pain as well.  - L5-S1: Small left paracentral disc protrusion which approaches the left S1 nerve but does not visibly compress or displace it. It is possible this could be associated with left S1 nerve irritation. Mild facet osteoarthritis at this leel could contribute to back pain.    06/25/21 MRI of the brain with and without contrast shows the following: 1.   Multiple T2/FLAIR hyperintense foci in the hemispheres in a pattern and configuration consistent with chronic demyelinating plaque associated with multiple sclerosis.  None of the foci appear to be acute.  They do not enhance.  Compared to the MRI dated 02/05/2019, there are no new lesions 2.   5 to 6 mm right parietal enhancing mass most consistent with a small meningioma, unchanged in appearance compared to the 02/05/2019 MRI. 3.   No acute findings.  06/25/21 MRI of the lumbar spine without contrast shows the following: 1.   At L4-L5, there is 1 to 2 mm anterolisthesis associated  with facet hypertrophy and bulging of the uncovered disc.  This causes mild to moderate right greater than left foraminal and lateral recess stenosis though there does not appear to be nerve root compression.  Compared to the 2020 MRI, joint effusions are no longer noted but the anterolisthesis has developed.  2.  At L5-S1, there is a stable small left paramedian disc protrusion and mild facet hypertrophy causing moderate left lateral recess stenosis though the S1 nerve root does not appear to be compressed.   Assessment and Plan:  53 y.o. female with:   Dx:  1. MS (multiple sclerosis) (HCC)     MULTIPLE SCLEROSIS (relapsing, remitting multiple sclerosis; established, stable) - continue ocrevus (every March, Sept) - continue exercises and PT - continue vitamin D - monitor CBC, CMP,  Ig levels annually - check MRI brain w/wo   LEFT LEG PAIN / BACK PAIN (persistent; sciatica; ? piriformis syndrome) - continue PT exercises   OBESITY (much improved) - s/p gastric sleeve (12/13/21); now on ozempic; doing well!   LIGHTHEADED / DIZZINESS (improved) - monitor; maintain hydration  MIGRAINE HEADACHES (established, stable) - monitor for now   MENINGIOMA (small, asymptomatic) - incidental finding; reassured patient  Orders Placed This Encounter  Procedures   MR BRAIN W WO CONTRAST   Immunoglobulins, QN, A/E/G/M   Follow Up Instructions:  - Return in about 6 months (around 06/10/2024).     Suanne Marker, MD 12/12/2023, 2:52 PM Certified in Neurology, Neurophysiology and Neuroimaging  [facility] [address], [address] [place], [place] [address] [phone]

## 2023-12-12 NOTE — Addendum Note (Signed)
Addended by: Leda Min on: 12/12/2023 01:53 PM   Modules accepted: Orders

## 2023-12-12 NOTE — Telephone Encounter (Signed)
Azithromycin Rx sent.   Dr. Karma Greaser -please advise on diflucan. Patient requested additional rx with azithromycin.

## 2023-12-12 NOTE — Addendum Note (Signed)
Addended by: Jodelle Red D on: 12/12/2023 03:01 PM   Modules accepted: Orders

## 2023-12-12 NOTE — Telephone Encounter (Signed)
Per EB:  "Sorry.  Diflucan 150mg  po x1  With 1 refill  Thank you   Dr. Karma Greaser"

## 2023-12-13 ENCOUNTER — Ambulatory Visit (INDEPENDENT_AMBULATORY_CARE_PROVIDER_SITE_OTHER): Payer: 59 | Admitting: Family Medicine

## 2023-12-13 ENCOUNTER — Telehealth: Payer: Self-pay | Admitting: Diagnostic Neuroimaging

## 2023-12-13 ENCOUNTER — Encounter: Payer: Self-pay | Admitting: Family Medicine

## 2023-12-13 VITALS — BP 138/90 | HR 93 | Temp 97.7°F | Ht 63.0 in | Wt 206.0 lb

## 2023-12-13 DIAGNOSIS — Z6836 Body mass index (BMI) 36.0-36.9, adult: Secondary | ICD-10-CM

## 2023-12-13 DIAGNOSIS — Z9884 Bariatric surgery status: Secondary | ICD-10-CM | POA: Diagnosis not present

## 2023-12-13 DIAGNOSIS — E66812 Obesity, class 2: Secondary | ICD-10-CM

## 2023-12-13 DIAGNOSIS — E119 Type 2 diabetes mellitus without complications: Secondary | ICD-10-CM | POA: Diagnosis not present

## 2023-12-13 DIAGNOSIS — Z7985 Long-term (current) use of injectable non-insulin antidiabetic drugs: Secondary | ICD-10-CM

## 2023-12-13 MED ORDER — OZEMPIC (1 MG/DOSE) 4 MG/3ML ~~LOC~~ SOPN: 1.0000 mg | PEN_INJECTOR | SUBCUTANEOUS | 1 refills | Status: DC

## 2023-12-13 NOTE — Assessment & Plan Note (Signed)
Lab Results  Component Value Date   HGBA1C 5.5 08/30/2023   She has good control of her type 2 diabetes using Ozempic 1 mg once weekly injection along with healthy dietary changes.  She hopes to add back in gym exercises as her MS improves.  She denies issues with nausea, meal skipping or hypoglycemia from Ozempic.  She is hitting her calorie and protein target using a calorie tracking app.  Continue Ozempic at 1 mg once weekly injection.  Recheck A1c in 2 months.  Continue working on a low sugar/low starch diet rich in lean protein and fiber.  Plan to add back in regular exercise 3 days a week once her MS symptoms improve

## 2023-12-13 NOTE — Progress Notes (Signed)
Office: [phone]  /  Fax: [phone]  WEIGHT SUMMARY AND BIOMETRICS  Starting Date: 08/23/23  Starting Weight: 212lb   Weight Lost Since Last Visit: 6lb   Vitals Temp: 97.7 F (36.5 C) BP: (!) 138/90 Pulse Rate: 93 SpO2: 99 %   Body Composition  Body Fat %: 45.5 % Fat Mass (lbs): 94 lbs Muscle Mass (lbs): 107 lbs Total Body Water (lbs): 76 lbs Visceral Fat Rating : 13   HPI  Chief Complaint: OBESITY  [name] is here to discuss her progress with her obesity treatment plan. She is on the the Category 2 Plan and states she is following her eating plan approximately 90-95 % of the time. She states she is exercising 0 minutes 0 times per week.  Interval History:  Since last office visit she is down 6 lb She is feeling a bit hungrier lately but has been tracking daily intake She is getting 1000-1200 cal/ day and 90 g of protein daily She has not been working out as much due to her MS She has improved satiety with her sleeve gastrectomy and use of Ozempic 1 mg once weekly injection Denies meal skipping or nausea She did cut out candy She is now 2 lb lower than her previous nadir weight post op weight of 208 lb  Pharmacotherapy: Ozempic 1 mg weekly  PHYSICAL EXAM:  Blood pressure (!) 138/90, pulse 93, temperature 97.7 F (36.5 C), height 5\' 3"  (1.6 m), weight 206 lb (93.4 kg), last menstrual period 11/10/2023, SpO2 99%. Body mass index is 36.49 kg/m.  General: She is overweight, cooperative, alert, well developed, and in no acute distress. PSYCH: Has normal mood, affect and thought process.   Lungs: Normal breathing effort, no conversational dyspnea.   ASSESSMENT AND PLAN  TREATMENT PLAN FOR OBESITY:  Recommended Dietary Goals  [name] is currently in the action stage of change. As such, her goal is to continue weight management plan. She has agreed to keeping a food journal and adhering to recommended goals of 1200 calories and 90 g of  protein.  Behavioral Intervention  We discussed the following Behavioral Modification Strategies today: increasing lean protein intake to established goals, increasing vegetables, increasing lower glycemic fruits, work on tracking and journaling calories using tracking application, keeping healthy foods at home, work on managing stress, creating time for self-care and relaxation, avoiding temptations and identifying enticing environmental cues, planning for success, and continue to work on maintaining a reduced calorie state, getting the recommended amount of protein, incorporating whole foods, making healthy choices, staying well hydrated and practicing mindfulness when eating..  Additional resources provided today: NA  Recommended Physical Activity Goals  [name] has been advised to work up to 150 minutes of moderate intensity aerobic activity a week and strengthening exercises 2-3 times per week for cardiovascular health, weight loss maintenance and preservation of muscle mass.   She has agreed to Think about enjoyable ways to increase daily physical activity and overcoming barriers to exercise and Increase physical activity in their day and reduce sedentary time (increase NEAT).  Pharmacotherapy changes for the treatment of obesity: None  ASSOCIATED CONDITIONS ADDRESSED TODAY  Type 2 diabetes mellitus without complication, without long-term current use of insulin (HCC) Assessment & Plan: Lab Results  Component Value Date   HGBA1C 5.5 08/30/2023   She has good control of her type 2 diabetes using Ozempic 1 mg once weekly injection along with healthy dietary changes.  She hopes to add back in gym exercises as her MS  improves.  She denies issues with nausea, meal skipping or hypoglycemia from Ozempic.  She is hitting her calorie and protein target using a calorie tracking app.  Continue Ozempic at 1 mg once weekly injection.  Recheck A1c in 2 months.  Continue working on a low sugar/low  starch diet rich in lean protein and fiber.  Plan to add back in regular exercise 3 days a week once her MS symptoms improve  Orders: -     Ozempic (1 MG/DOSE); Inject 1 mg into the skin once a week.  Dispense: 3 mL; Refill: 1  Class 2 severe obesity due to excess calories with serious comorbidity and body mass index (BMI) of 36.0 to 36.9 in adult [facility])  Status post laparoscopic sleeve gastrectomy Assessment & Plan: Improving.  She is now lower than her previous postop nadir weight at, down 2 pounds lower with her previous nadir weight of 208 pounds.  She is feeling improved satiety using Ozempic hopefully gastrectomy.  She is able to get in small meals throughout the day focusing more on lean protein and fiber with meals and has cut out candy.  He plans to add back in both cardio and resistance training exercises once her MS flare improves.  Continue current plan of care.  Be sure to drink water outside of mealtime.  Aim for a total of 90 g of protein intake daily.  Continue supplements as reviewed.       She was informed of the importance of frequent follow up visits to maximize her success with intensive lifestyle modifications for her multiple health conditions.   ATTESTASTION STATEMENTS:  Reviewed by clinician on day of visit: allergies, medications, problem list, medical history, surgical history, family history, social history, and previous encounter notes pertinent to obesity diagnosis.   I have personally spent 30 minutes total time today in preparation, patient care, nutritional counseling and documentation for this visit, including the following: review of clinical lab tests; review of medical tests/procedures/services.      Glennis Brink, DO DABFM, DABOM [facility] [address] [place], [place] [address] [phone]

## 2023-12-13 NOTE — Assessment & Plan Note (Signed)
Improving.  She is now lower than her previous postop nadir weight at, down 2 pounds lower with her previous nadir weight of 208 pounds.  She is feeling improved satiety using Ozempic hopefully gastrectomy.  She is able to get in small meals throughout the day focusing more on lean protein and fiber with meals and has cut out candy.  He plans to add back in both cardio and resistance training exercises once her MS flare improves.  Continue current plan of care.  Be sure to drink water outside of mealtime.  Aim for a total of 90 g of protein intake daily.  Continue supplements as reviewed.

## 2023-12-13 NOTE — Telephone Encounter (Signed)
[employer] medicare NPR sent to GI [phone]

## 2023-12-15 LAB — IMMUNOGLOBULINS A/E/G/M, SERUM
IgA/Immunoglobulin A, Serum: 259 mg/dL (ref 87–352)
IgE (Immunoglobulin E), Serum: 10 [IU]/mL (ref 6–495)
IgG (Immunoglobin G), Serum: 929 mg/dL (ref 586–1602)
IgM (Immunoglobulin M), Srm: 31 mg/dL (ref 26–217)

## 2023-12-17 ENCOUNTER — Encounter: Payer: Self-pay | Admitting: Podiatry

## 2023-12-21 ENCOUNTER — Ambulatory Visit: Payer: 59 | Admitting: Nurse Practitioner

## 2023-12-21 VITALS — BP 124/76 | HR 75 | Wt 209.0 lb

## 2023-12-21 DIAGNOSIS — N760 Acute vaginitis: Secondary | ICD-10-CM | POA: Diagnosis not present

## 2023-12-21 LAB — WET PREP FOR TRICH, YEAST, CLUE

## 2023-12-21 MED ORDER — METRONIDAZOLE 500 MG PO TABS: 500.0000 mg | ORAL_TABLET | Freq: Two times a day (BID) | ORAL | 0 refills | Status: AC

## 2023-12-21 MED ORDER — FLUCONAZOLE 150 MG PO TABS: 150.0000 mg | ORAL_TABLET | ORAL | 0 refills | Status: DC

## 2023-12-21 NOTE — Progress Notes (Signed)
   Acute Office Visit  Subjective:    Patient ID: [name], female    DOB: 12-08-1970, 53 y.o.   MRN: [id number]   HPI 53 y.o. presents today for follow up. Treated for BV 12/04/23, treated for ureaplasma and mycoplasma 12/07/23 with Doxy but did not tolerate and was switched to Azithromycin . Finished 7-day course of azithromycin  2 days ago. Took 1 Diflucan  while taking antibiotics. Notices odor today. Denies discharge or itching. Reports history of BV that requires 2 week course of Flagyl . Uses summer's eve boric acid wash.   Patient's last menstrual period was 11/10/2023 (exact date).    Review of Systems  Constitutional: Negative.   Genitourinary:  Negative for vaginal discharge and vaginal pain.       Vaginal odor       Objective:    Physical Exam Constitutional:      Appearance: Normal appearance.  Genitourinary:    General: Normal vulva.     Vagina: Vaginal discharge present. No erythema.     Cervix: Normal.     BP 124/76 (BP Location: Right Arm, Patient Position: Sitting, Cuff Size: Large)   Pulse 75   Wt 209 lb (94.8 kg)   LMP 11/10/2023 (Exact Date)   SpO2 99%   BMI 37.02 kg/m  Wt Readings from Last 3 Encounters:  12/21/23 209 lb (94.8 kg)  12/13/23 206 lb (93.4 kg)  12/12/23 208 lb (94.3 kg)        Patient informed chaperone available to be present for breast and/or pelvic exam. Patient has requested no chaperone to be present. Patient has been advised what will be completed during breast and pelvic exam.   Wet prep + clue cells (+ odor)  Assessment & Plan:   Problem List Items Addressed This Visit   None Visit Diagnoses       Bacterial vaginosis    -  Primary   Relevant Medications   metroNIDAZOLE  (FLAGYL ) 500 MG tablet        Acute vaginitis       Relevant Medications   fluconazole  (DIFLUCAN ) 150 MG tablet   Other Relevant Orders   WET PREP FOR TRICH, YEAST, CLUE      Plan: Wet prep positive for clue cells - Flagyl  500 mg BID x 14  days due to persistent infections. Diflucan  provided per request. Discussed vaginal health practices, women's probiotics, and boric acid suppositories. Too soon to retest for ureaplasma/mycoplasma, so she will return in 3-4 weeks.    Return in about 3 weeks (around 01/11/2024) for TOC.    Jodi DELENA Shutter DNP, 9:14 AM 12/21/2023

## 2023-12-23 ENCOUNTER — Encounter: Payer: Self-pay | Admitting: Nurse Practitioner

## 2024-01-01 ENCOUNTER — Ambulatory Visit
Admission: RE | Admit: 2024-01-01 | Discharge: 2024-01-01 | Disposition: A | Discharge status: Home / Self Care | Payer: 59 | Source: Ambulatory Visit | Attending: Diagnostic Neuroimaging | Admitting: Diagnostic Neuroimaging

## 2024-01-01 ENCOUNTER — Telehealth: Payer: Self-pay

## 2024-01-01 DIAGNOSIS — E119 Type 2 diabetes mellitus without complications: Secondary | ICD-10-CM

## 2024-01-01 DIAGNOSIS — G35 Multiple sclerosis: Secondary | ICD-10-CM | POA: Diagnosis not present

## 2024-01-01 MED ORDER — OZEMPIC (2 MG/DOSE) 8 MG/3ML ~~LOC~~ SOPN: 2.0000 mg | PEN_INJECTOR | SUBCUTANEOUS | 0 refills | Status: DC

## 2024-01-01 MED ORDER — GADOPICLENOL 0.5 MMOL/ML IV SOLN
10.0000 mL | Freq: Once | INTRAVENOUS | Status: AC | PRN
Start: 2024-01-01 — End: 2024-01-01
  Administered 2024-01-01: 10 mL via INTRAVENOUS

## 2024-01-01 NOTE — Telephone Encounter (Signed)
PA required for Phexxi Gel  Key: [id number]

## 2024-01-02 NOTE — Telephone Encounter (Signed)
Initial PA sent.

## 2024-01-04 ENCOUNTER — Other Ambulatory Visit: Payer: Self-pay

## 2024-01-04 DIAGNOSIS — Z79899 Other long term (current) drug therapy: Secondary | ICD-10-CM

## 2024-01-04 NOTE — Telephone Encounter (Signed)
PA was denied by insurance for rx due to:  Phexxi not being approved through FDA for indications: subacute/chronic vaginitis.   Please advise.

## 2024-01-04 NOTE — Progress Notes (Signed)
MRI brain is stable. Continue current plan. -VRP

## 2024-01-05 NOTE — Telephone Encounter (Signed)
Per EB:  "She may have to pay out of pocket costs Dr. Karma Greaser"

## 2024-01-10 ENCOUNTER — Encounter: Payer: Self-pay | Admitting: Family Medicine

## 2024-01-10 ENCOUNTER — Ambulatory Visit (INDEPENDENT_AMBULATORY_CARE_PROVIDER_SITE_OTHER): Payer: 59 | Admitting: Family Medicine

## 2024-01-10 VITALS — BP 134/89 | HR 79 | Temp 98.0°F | Ht 63.0 in | Wt 205.0 lb

## 2024-01-10 DIAGNOSIS — E119 Type 2 diabetes mellitus without complications: Secondary | ICD-10-CM | POA: Diagnosis not present

## 2024-01-10 DIAGNOSIS — E66812 Obesity, class 2: Secondary | ICD-10-CM | POA: Diagnosis not present

## 2024-01-10 DIAGNOSIS — Z6836 Body mass index (BMI) 36.0-36.9, adult: Secondary | ICD-10-CM

## 2024-01-10 DIAGNOSIS — Z7985 Long-term (current) use of injectable non-insulin antidiabetic drugs: Secondary | ICD-10-CM

## 2024-01-10 DIAGNOSIS — Z9884 Bariatric surgery status: Secondary | ICD-10-CM

## 2024-01-10 NOTE — Patient Instructions (Addendum)
 Go up on Ozempic to 2 mg weekly  Log intake on the MyFitnessPal ap Aim for 1200 cal/ day This should include 85-100 g of protein intake daily  Hydrate well with water between meals Allow 2 fresh or frozen fruit servings per day Get in plenty of non starchy veggies  Aim for 30 min of walking or gym workouts for a total of 5 days/ wk

## 2024-01-10 NOTE — Progress Notes (Signed)
 Office: [phone]  /  Fax: [phone]  WEIGHT SUMMARY AND BIOMETRICS  Starting Date: 08/23/23  Starting Weight: 212lb   Weight Lost Since Last Visit: 1lb   Vitals Temp: 98 F (36.7 C) BP: 134/89 Pulse Rate: 79 SpO2: 99 %   Body Composition  Body Fat %: 46.1 % Fat Mass (lbs): 94.6 lbs Muscle Mass (lbs): 105.2 lbs Total Body Water (lbs): 78 lbs Visceral Fat Rating : 13     HPI  Chief Complaint: OBESITY  [name] is here to discuss her progress with her obesity treatment plan. She is on the the Category 2 Plan and states she is following her eating plan approximately 90 % of the time. She states she is exercising 60 minutes 1-2 times per week.   Interval History:  Since last office visit she Is down 1 lb This gives her a net weight loss of 7 lb in the past 4 mos of medically supervised weight management She has surpassed her previous post op nadir weight of 208 lb s/p VSG in  She has cut back on candy intake She is going to the gym 1 day/ wk on avg She plans to ramp up walking outside She is making better food choices She has not yet started the 2 mg dose of Ozempic  Pharmacotherapy: Ozempic 1 mg weekly  PHYSICAL EXAM:  Blood pressure 134/89, pulse 79, temperature 98 F (36.7 C), height 5\' 3"  (1.6 m), weight 205 lb (93 kg), SpO2 99%. Body mass index is 36.31 kg/m.  General: She is overweight, cooperative, alert, well developed, and in no acute distress. PSYCH: Has normal mood, affect and thought process.   Lungs: Normal breathing effort, no conversational dyspnea.   ASSESSMENT AND PLAN  TREATMENT PLAN FOR OBESITY:  Recommended Dietary Goals  [name] is currently in the action stage of change. As such, her goal is to continue weight management plan. She has agreed to logging daily intake using the my fitness pal app 1200 .  Calories per day which should include 85 to 100 g of protein intake daily  Behavioral Intervention  We discussed the  following Behavioral Modification Strategies today: increasing lean protein intake to established goals, increasing fiber rich foods, increasing water intake , work on meal planning and preparation, work on Counselling psychologist calories using tracking application, keeping healthy foods at home, practice mindfulness eating and understand the difference between hunger signals and cravings, work on managing stress, creating time for self-care and relaxation, avoiding temptations and identifying enticing environmental cues, and continue to work on maintaining a reduced calorie state, getting the recommended amount of protein, incorporating whole foods, making healthy choices, staying well hydrated and practicing mindfulness when eating..  Additional resources provided today: NA  Recommended Physical Activity Goals  [name] has been advised to work up to 150 minutes of moderate intensity aerobic activity a week and strengthening exercises 2-3 times per week for cardiovascular health, weight loss maintenance and preservation of muscle mass.   She has agreed to [employer] strengthening exercises with a goal of 2-3 sessions a week  and Start aerobic activity with a goal of 150 minutes a week at moderate intensity.  Improve frequency of exercise with a goal of 30 minutes 5 days a week to include both cardio and resistance training.  This may include outdoor walking or gym workouts  Pharmacotherapy changes for the treatment of obesity: Increasing Ozempic to 2 mg once weekly on next prescription  ASSOCIATED CONDITIONS ADDRESSED TODAY  Type 2  diabetes mellitus without complication, without long-term current use of insulin (HCC) She has been doing improvements in her A1c levels with reducing from 6.8-5.5.  She is tolerating Ozempic quite well without adverse side effect but has seen a reduction in improved satiety.  She has been reducing her intake of sugar and starchy foods.  She has room for improvement with  regular exercise.  Follow-up with PCP for routine diabetes management.  Increase Ozempic to 2 mg once weekly injection  Class 2 severe obesity due to excess calories with serious comorbidity and body mass index (BMI) of 36.0 to 36.9 in adult [facility])  Status post laparoscopic sleeve gastrectomy She has improved portion control at mealtime since starting prescribed dietary plan with 1200 cal/day which should include around 90 g of protein intake daily.  She is taking a bariatric multivitamin once daily.  Her energy level has improved.  She has better control over snacking and cravings.  She has surpassed her previous nadir weight of 208 pounds.  Continue a bariatric multivitamin once daily.  Work on eating 3 small meals a day with 2 high-protein snacks daily.  Ramp up exercise to include cardio and resistance training for total 5 days a week     She was informed of the importance of frequent follow up visits to maximize her success with intensive lifestyle modifications for her multiple health conditions.   ATTESTASTION STATEMENTS:  Reviewed by clinician on day of visit: allergies, medications, problem list, medical history, surgical history, family history, social history, and previous encounter notes pertinent to obesity diagnosis.   I have personally spent 30 minutes total time today in preparation, patient care, nutritional counseling and documentation for this visit, including the following: review of clinical lab tests; review of medical tests/procedures/services.      Glennis Brink, DO DABFM, DABOM [facility] [address] [place], [place] [address] [phone]

## 2024-01-11 ENCOUNTER — Ambulatory Visit (INDEPENDENT_AMBULATORY_CARE_PROVIDER_SITE_OTHER): Payer: 59 | Admitting: Nurse Practitioner

## 2024-01-11 VITALS — BP 124/72 | Wt 213.0 lb

## 2024-01-11 DIAGNOSIS — N760 Acute vaginitis: Secondary | ICD-10-CM

## 2024-01-11 NOTE — Progress Notes (Signed)
   Acute Office Visit  Subjective:    Patient ID: [name], female    DOB: 1970-11-24, 53 y.o.   MRN: [id number]   HPI 53 y.o. presents today for [facility] for mycoplasma/ureaplasma. Treated for ureaplasma and mycoplasma 12/07/23 with Doxy but did not tolerate and was switched to Azithromycin. H/O recurrent BV - most recently 12/04/23, 12/21/23. No symptoms today. Has started boric acid suppositories and probiotics.   Patient's last menstrual period was 11/10/2023.    Review of Systems  Constitutional: Negative.   Genitourinary: Negative.        Objective:    Physical Exam Constitutional:      Appearance: Normal appearance.  Genitourinary:    General: Normal vulva.     Vagina: Normal.     Cervix: Normal.     BP 124/72   Wt 213 lb (96.6 kg)   LMP 11/10/2023   BMI 37.73 kg/m  Wt Readings from Last 3 Encounters:  01/11/24 213 lb (96.6 kg)  01/10/24 205 lb (93 kg)  12/21/23 209 lb (94.8 kg)        Patient informed chaperone available to be present for breast and/or pelvic exam. Patient has requested no chaperone to be present. Patient has been advised what will be completed during breast and pelvic exam.   Assessment & Plan:   Problem List Items Addressed This Visit   None Visit Diagnoses       Recurrent vaginitis    -  Primary   Relevant Orders   Mycoplasma / ureaplasma culture      Plan: Mycoplasma/ureaplasma pending. Continue boric acid and women's probiotics.   *Alternate lab tube used today due to expiration. Patient aware if unable to complete test she will not be billed for visit or labs per office admin. Patient verbalizes understanding.   Return if symptoms worsen or fail to improve.    Olivia Mackie DNP, 9:26 AM 01/11/2024

## 2024-01-13 ENCOUNTER — Other Ambulatory Visit: Payer: 59

## 2024-01-17 ENCOUNTER — Encounter: Payer: Self-pay | Admitting: Nurse Practitioner

## 2024-01-17 DIAGNOSIS — E2839 Other primary ovarian failure: Secondary | ICD-10-CM

## 2024-01-17 LAB — MYCOPLASMA / UREAPLASMA CULTURE

## 2024-01-24 ENCOUNTER — Telehealth: Payer: Self-pay | Admitting: Obstetrics and Gynecology

## 2024-01-24 NOTE — Telephone Encounter (Signed)
 Patient with multiple risk factors for osteoporosis and needs baseline bone scan. Hyperparathyroidism with thryoidectomy Lumbar degenerative disks, hypoestrogenism. Patient agreed. Referral placed. Let her know she can repeat her pap smear in 2 years. [facility] 2024 with hpv DNA neg Dr. Karma Greaser

## 2024-01-25 ENCOUNTER — Other Ambulatory Visit: Payer: Self-pay | Admitting: Family Medicine

## 2024-01-25 DIAGNOSIS — E119 Type 2 diabetes mellitus without complications: Secondary | ICD-10-CM

## 2024-01-30 ENCOUNTER — Other Ambulatory Visit: Payer: Self-pay | Admitting: Family Medicine

## 2024-01-30 DIAGNOSIS — E119 Type 2 diabetes mellitus without complications: Secondary | ICD-10-CM

## 2024-01-31 ENCOUNTER — Other Ambulatory Visit: Payer: Self-pay

## 2024-01-31 DIAGNOSIS — E119 Type 2 diabetes mellitus without complications: Secondary | ICD-10-CM

## 2024-01-31 MED ORDER — OZEMPIC (2 MG/DOSE) 8 MG/3ML ~~LOC~~ SOPN: 2.0000 mg | PEN_INJECTOR | SUBCUTANEOUS | 0 refills | Status: DC

## 2024-02-01 ENCOUNTER — Encounter: Payer: Self-pay | Admitting: Obstetrics and Gynecology

## 2024-02-01 ENCOUNTER — Ambulatory Visit (HOSPITAL_BASED_OUTPATIENT_CLINIC_OR_DEPARTMENT_OTHER)
Admission: RE | Admit: 2024-02-01 | Discharge: 2024-02-01 | Disposition: A | Discharge status: Home / Self Care | Source: Ambulatory Visit | Attending: Obstetrics and Gynecology | Admitting: Obstetrics and Gynecology

## 2024-02-01 DIAGNOSIS — E2839 Other primary ovarian failure: Secondary | ICD-10-CM | POA: Diagnosis present

## 2024-02-07 ENCOUNTER — Encounter: Payer: Self-pay | Admitting: Family Medicine

## 2024-02-07 ENCOUNTER — Ambulatory Visit (INDEPENDENT_AMBULATORY_CARE_PROVIDER_SITE_OTHER): Payer: 59 | Admitting: Family Medicine

## 2024-02-07 VITALS — BP 130/88 | HR 72 | Temp 98.4°F | Ht 63.0 in | Wt 206.0 lb

## 2024-02-07 DIAGNOSIS — E119 Type 2 diabetes mellitus without complications: Secondary | ICD-10-CM

## 2024-02-07 DIAGNOSIS — Z7985 Long-term (current) use of injectable non-insulin antidiabetic drugs: Secondary | ICD-10-CM

## 2024-02-07 DIAGNOSIS — Z6836 Body mass index (BMI) 36.0-36.9, adult: Secondary | ICD-10-CM | POA: Diagnosis not present

## 2024-02-07 DIAGNOSIS — G35 Multiple sclerosis: Secondary | ICD-10-CM

## 2024-02-07 DIAGNOSIS — E66812 Obesity, class 2: Secondary | ICD-10-CM | POA: Diagnosis not present

## 2024-02-07 DIAGNOSIS — Z9884 Bariatric surgery status: Secondary | ICD-10-CM

## 2024-02-07 MED ORDER — OZEMPIC (2 MG/DOSE) 8 MG/3ML ~~LOC~~ SOPN: 2.0000 mg | PEN_INJECTOR | SUBCUTANEOUS | 1 refills | Status: DC

## 2024-02-07 NOTE — Patient Instructions (Addendum)
 Keep total daily calories ~1200 per day You are welcome to track on the Lose It ap Do not add your exercise into the Ap This should include 90-110 g of protein daily  Remember your fruits and veggies daily  Keep some sugar free electrolytes in daily ([employer]el, GZERO, [employer] LYTE)  Limit meals out to 2 x a week  Goal for exercise: Gym workouts or home treadmill walking 5 days/ wk

## 2024-02-07 NOTE — Progress Notes (Signed)
 Office: [phone]  /  Fax: [phone]  WEIGHT SUMMARY AND BIOMETRICS  Starting Date: 08/20/23  Starting Weight: 212lb   Weight Lost Since Last Visit: 0lb   Vitals Temp: 98.4 F (36.9 C) BP: 130/88 Pulse Rate: 72 SpO2: 97 %   Body Composition  Body Fat %: 46 % Fat Mass (lbs): 94.8 lbs Muscle Mass (lbs): 105.8 lbs Total Body Water (lbs): 79.2 lbs Visceral Fat Rating : 13   HPI  Chief Complaint: OBESITY  [name] is here to discuss her progress with her obesity treatment plan. She is on the the Category 2 Plan and states she is following her eating plan approximately 85 % of the time. She states she is exercising 30 minutes 2 times per week.  Interval History:  Since last office visit she is up 1 lb She is up 0.6 lb of muscle mass and up 0.2 lb of body fat This gives her a net weight loss of 6 lb in 5 mos of medically supervised weight management She is watching portion sizes but has been eating out more She is staying off SSBs She is staying off candy She has been dizzy since before and after Ocrevus infusion She lacks a good support system at home She would like to resume her gym workouts She remains lower than her previous nadir weight post VSG (208 lb) but has a goal to get < 200 lb. Denies problems or changes in eating habits with increased dose of Ozempic to 2 mg weekly  Pharmacotherapy: Ozempic 2 mg once weekly injection  PHYSICAL EXAM:  Blood pressure 130/88, pulse 72, temperature 98.4 F (36.9 C), height 5\' 3"  (1.6 m), weight 206 lb (93.4 kg), last menstrual period 11/10/2023, SpO2 97%. Body mass index is 36.49 kg/m.  General: She is overweight, cooperative, alert, well developed, and in no acute distress. PSYCH: Has normal mood, affect and thought process.   Lungs: Normal breathing effort, no conversational dyspnea. Using cane to ambulate with slow head movements  ASSESSMENT AND PLAN  TREATMENT PLAN FOR OBESITY:  Recommended Dietary  Goals  [name] is currently in the action stage of change. As such, her goal is to continue weight management plan. She has agreed to the Category 2 Plan. Reduce frequency of meals Continue a sugar-free electrolyte drink daily  Behavioral Intervention  We discussed the following Behavioral Modification Strategies today: increasing lean protein intake to established goals, increasing fiber rich foods, increasing water intake , work on meal planning and preparation, keeping healthy foods at home, identifying sources and decreasing liquid calories, decreasing eating out or consumption of processed foods, and making healthy choices when eating convenient foods, practice mindfulness eating and understand the difference between hunger signals and cravings, work on managing stress, creating time for self-care and relaxation, avoiding temptations and identifying enticing environmental cues, and continue to work on maintaining a reduced calorie state, getting the recommended amount of protein, incorporating whole foods, making healthy choices, staying well hydrated and practicing mindfulness when eating..  Additional resources provided today: NA  Recommended Physical Activity Goals  [name] has been advised to work up to 150 minutes of moderate intensity aerobic activity a week and strengthening exercises 2-3 times per week for cardiovascular health, weight loss maintenance and preservation of muscle mass.   She has agreed to Increase the intensity, frequency or duration of strengthening exercises  and Increase the intensity, frequency or duration of aerobic exercises   Once dizziness improves, resume gym workouts 4 to 5 days a week  incorporating both cardio and resistance training Consider use of a personal trainer for additional help  Pharmacotherapy changes for the treatment of obesity: None  ASSOCIATED CONDITIONS ADDRESSED TODAY  Type 2 diabetes mellitus without complication, without long-term  current use of insulin (HCC) Lab Results  Component Value Date   HGBA1C 5.5 08/30/2023  She has good control of her type 2 diabetes, doing well on Ozempic 2 mg once weekly injection without GI side effects.  She has been actively working on adjusting her diet, regular exercise and weight reduction.  -     Ozempic (2 MG/DOSE); Inject 2 mg into the skin once a week.  Dispense: 3 mL; Refill: 1  Class 2 severe obesity due to excess calories with serious comorbidity and body mass index (BMI) of 36.0 to 36.9 in adult [facility])  Status post laparoscopic sleeve gastrectomy She feels restriction with food volumes at mealtime post sleeve gastrectomy and with use of Ozempic.  She has had to split her meals into 4-5 smaller meals each day but has improved her intake of lean protein and fiber while reducing added sugar.  She is drinking water outside of mealtime and remains on a bariatric multivitamin once daily.  Annual bariatric labs are reviewed and up-to-date.  Continue a bariatric multivitamin once daily, portion control at mealtime with a priority for lean protein at meals and snacks  MS (multiple sclerosis) (HCC) MS has been an issue since her last visit which has hindered exercise.  She has been dealing with some dizziness, ambulating with a cane today since her Ocrevus infusion  Follow-up with neurology for further treatment Caution with walking for exercise while dizzy   She was informed of the importance of frequent follow up visits to maximize her success with intensive lifestyle modifications for her multiple health conditions.   ATTESTASTION STATEMENTS:  Reviewed by clinician on day of visit: allergies, medications, problem list, medical history, surgical history, family history, social history, and previous encounter notes pertinent to obesity diagnosis.   I have personally spent 30 minutes total time today in preparation, patient care, nutritional counseling and documentation for this  visit, including the following: review of clinical lab tests; review of medical tests/procedures/services.      Glennis Brink, DO DABFM, DABOM [facility] [address] [place], [place] [address] [phone]

## 2024-02-09 LAB — HEPATIC FUNCTION PANEL
ALT: 12 IU/L (ref 0–32)
AST: 12 IU/L (ref 0–40)
Albumin: 4.3 g/dL (ref 3.8–4.9)
Alkaline Phosphatase: 94 IU/L (ref 44–121)
Bilirubin Total: <0.2 mg/dL (ref 0.0–1.2)
Bilirubin, Direct: 0.09 mg/dL (ref 0.00–0.40)
Total Protein: 6.5 g/dL (ref 6.0–8.5)

## 2024-02-09 MED ORDER — TERBINAFINE HCL 250 MG PO TABS: 250.0000 mg | ORAL_TABLET | Freq: Every day | ORAL | 0 refills | Status: DC

## 2024-02-09 NOTE — Addendum Note (Signed)
 Addended by: Nicholes Rough on: 02/09/2024 07:52 AM   Modules accepted: Orders

## 2024-02-12 ENCOUNTER — Encounter: Payer: Self-pay | Admitting: Podiatry

## 2024-02-13 ENCOUNTER — Other Ambulatory Visit: Payer: Self-pay | Admitting: Podiatry

## 2024-02-13 MED ORDER — TERBINAFINE HCL 250 MG PO TABS: 250.0000 mg | ORAL_TABLET | Freq: Every day | ORAL | 0 refills | Status: AC

## 2024-02-15 ENCOUNTER — Other Ambulatory Visit: Payer: Self-pay | Admitting: Family Medicine

## 2024-02-15 DIAGNOSIS — E119 Type 2 diabetes mellitus without complications: Secondary | ICD-10-CM

## 2024-03-06 ENCOUNTER — Telehealth: Admitting: Physician Assistant

## 2024-03-06 DIAGNOSIS — N760 Acute vaginitis: Secondary | ICD-10-CM | POA: Diagnosis not present

## 2024-03-06 DIAGNOSIS — B9689 Other specified bacterial agents as the cause of diseases classified elsewhere: Secondary | ICD-10-CM

## 2024-03-06 MED ORDER — METRONIDAZOLE 500 MG PO TABS: 500.0000 mg | ORAL_TABLET | Freq: Two times a day (BID) | ORAL | 0 refills | Status: DC

## 2024-03-06 NOTE — Progress Notes (Signed)

## 2024-03-06 NOTE — Progress Notes (Signed)
 I have spent 5 minutes in review of e-visit questionnaire, review and updating patient chart, medical decision making and response to patient.   Piedad Climes, PA-C

## 2024-03-07 ENCOUNTER — Ambulatory Visit: Admitting: Family Medicine

## 2024-03-07 ENCOUNTER — Encounter: Payer: Self-pay | Admitting: Family Medicine

## 2024-03-07 VITALS — BP 123/81 | HR 72 | Temp 98.3°F | Ht 63.0 in | Wt 203.0 lb

## 2024-03-07 DIAGNOSIS — Z9884 Bariatric surgery status: Secondary | ICD-10-CM

## 2024-03-07 DIAGNOSIS — Z7985 Long-term (current) use of injectable non-insulin antidiabetic drugs: Secondary | ICD-10-CM

## 2024-03-07 DIAGNOSIS — E66812 Obesity, class 2: Secondary | ICD-10-CM

## 2024-03-07 DIAGNOSIS — Z6835 Body mass index (BMI) 35.0-35.9, adult: Secondary | ICD-10-CM | POA: Diagnosis not present

## 2024-03-07 DIAGNOSIS — E119 Type 2 diabetes mellitus without complications: Secondary | ICD-10-CM

## 2024-03-07 MED ORDER — OZEMPIC (2 MG/DOSE) 8 MG/3ML ~~LOC~~ SOPN
2.0000 mg | PEN_INJECTOR | SUBCUTANEOUS | 1 refills | Status: DC
Start: 2024-03-07 — End: 2024-04-03

## 2024-03-07 NOTE — Progress Notes (Signed)
 Office: [phone]  /  Fax: [phone]  WEIGHT SUMMARY AND BIOMETRICS  Starting Date: 08/20/23  Starting Weight: 212lb   Weight Lost Since Last Visit: 3lb   Vitals Temp: 98.3 F (36.8 C) BP: 123/81 Pulse Rate: 72 SpO2: 98 %   Body Composition  Body Fat %: 45.7 % Fat Mass (lbs): 92.8 lbs Muscle Mass (lbs): 104.8 lbs Total Body Water  (lbs): 79.8 lbs Visceral Fat Rating : 13     HPI  Chief Complaint: OBESITY  [name] is here to discuss her progress with her obesity treatment plan. She is on the the Category 4 Plan and states she is following her eating plan approximately 95 % of the time. She states she is exercising walking 60 minutes 6 times per week.  Interval History:  Since last office visit she is is down 3 lb This gives her a net weight loss of 9 pounds in the past 6 months. She is currently at her nadir weight post sleeve gastrectomy She is staying off candy She is eating on plan -- cat 2 She staying off SSBs She is no longer feeling dizzy She is walking one hour 6 days/ wk and feels good  Pharmacotherapy: Ozempic  2 mg weekly  PHYSICAL EXAM:  Blood pressure 123/81, pulse 72, temperature 98.3 F (36.8 C), height 5\' 3"  (1.6 m), weight 203 lb (92.1 kg), last menstrual period 02/20/2024, SpO2 98%. Body mass index is 35.96 kg/m.  General: She is overweight, cooperative, alert, well developed, and in no acute distress. PSYCH: Has normal mood, affect and thought process.   Lungs: Normal breathing effort, no conversational dyspnea.   ASSESSMENT AND PLAN  TREATMENT PLAN FOR OBESITY:  Recommended Dietary Goals  [name] is currently in the action stage of change. As such, her goal is to continue weight management plan. She has agreed to the Category 2 Plan.  Behavioral Intervention  We discussed the following Behavioral Modification Strategies today: increasing lean protein intake to established goals, increasing fiber rich foods, increasing water   intake , work on meal planning and preparation, keeping healthy foods at home, practice mindfulness eating and understand the difference between hunger signals and cravings, avoiding temptations and identifying enticing environmental cues, planning for success, and continue to work on maintaining a reduced calorie state, getting the recommended amount of protein, incorporating whole foods, making healthy choices, staying well hydrated and practicing mindfulness when eating..  Additional resources provided today: NA  Recommended Physical Activity Goals  [name] has been advised to work up to 150 minutes of moderate intensity aerobic activity a week and strengthening exercises 2-3 times per week for cardiovascular health, weight loss maintenance and preservation of muscle mass.   She has agreed to Work on scheduling and tracking physical activity.  Great job ramping up walking to 60 minutes 5 days a week Plan to add in weight training over the next 2 to 3 months  Pharmacotherapy changes for the treatment of obesity: None  ASSOCIATED CONDITIONS ADDRESSED TODAY  Type 2 diabetes mellitus without complication, without long-term current use of insulin  (HCC) Lab Results  Component Value Date   HGBA1C 5.5 08/30/2023   She has good control of her type 2 diabetes using Ozempic  2 mg once weekly injection without adverse side effect.  Continue active plan for weight reduction.  Continue prescribed dietary plan, low in added sugar and refined carbohydrates.  Continue regular exercise with a goal of 5 days a week. -     Ozempic  (2 MG/DOSE); Inject 2 mg  into the skin once a week.  Dispense: 3 mL; Refill: 1  Class 2 severe obesity due to excess calories with serious comorbidity and body mass index (BMI) of 35.0 to 35.9 in adult [facility])  Status post laparoscopic sleeve gastrectomy She has improved volume control at mealtime with sleeve gastrectomy in addition to use of Ozempic  without GI upset.  She has  prioritize intake of lean protein.  She is at times getting in only 60 g/day with a target goal of 90.  She has done a better job of eating on the schedule and incorporating more fiber with meals.  She has added in a bariatric multivitamin once daily and annual bariatric labs are reviewed and up-to-date.  Continue to work on drinking water  and other sugar-free noncarbonated drinks outside of mealtime     She was informed of the importance of frequent follow up visits to maximize her success with intensive lifestyle modifications for her multiple health conditions.   ATTESTASTION STATEMENTS:  Reviewed by clinician on day of visit: allergies, medications, problem list, medical history, surgical history, family history, social history, and previous encounter notes pertinent to obesity diagnosis.   I have personally spent 25 minutes total time today in preparation, patient care, nutritional counseling and education,  and documentation for this visit, including the following: review of most recent clinical lab tests, prescribing medications/ refilling medications, reviewing medical assistant documentation, review and interpretation of bioimpedence results.     [name], D.O. DABFM, DABOM [facility] [address] [place], [place] [address] [phone]

## 2024-03-25 ENCOUNTER — Other Ambulatory Visit: Payer: Self-pay | Admitting: Podiatry

## 2024-03-25 ENCOUNTER — Other Ambulatory Visit: Payer: Self-pay | Admitting: Family Medicine

## 2024-03-25 ENCOUNTER — Encounter: Payer: Self-pay | Admitting: Podiatry

## 2024-03-25 DIAGNOSIS — E119 Type 2 diabetes mellitus without complications: Secondary | ICD-10-CM

## 2024-03-27 ENCOUNTER — Other Ambulatory Visit: Payer: Self-pay

## 2024-03-27 ENCOUNTER — Other Ambulatory Visit (HOSPITAL_COMMUNITY): Payer: Self-pay

## 2024-03-27 MED ORDER — TIZANIDINE HCL 4 MG PO TABS
4.0000 mg | ORAL_TABLET | Freq: Four times a day (QID) | ORAL | 2 refills | Status: AC | PRN
  Filled 2024-03-27: qty 30, 8d supply, fill #0

## 2024-04-03 ENCOUNTER — Ambulatory Visit (INDEPENDENT_AMBULATORY_CARE_PROVIDER_SITE_OTHER): Admitting: Family Medicine

## 2024-04-03 ENCOUNTER — Encounter: Payer: Self-pay | Admitting: Family Medicine

## 2024-04-03 VITALS — BP 124/80 | HR 75 | Temp 98.0°F | Ht 63.0 in | Wt 198.0 lb

## 2024-04-03 DIAGNOSIS — Z7985 Long-term (current) use of injectable non-insulin antidiabetic drugs: Secondary | ICD-10-CM

## 2024-04-03 DIAGNOSIS — M5116 Intervertebral disc disorders with radiculopathy, lumbar region: Secondary | ICD-10-CM | POA: Diagnosis not present

## 2024-04-03 DIAGNOSIS — E119 Type 2 diabetes mellitus without complications: Secondary | ICD-10-CM

## 2024-04-03 DIAGNOSIS — E66812 Obesity, class 2: Secondary | ICD-10-CM

## 2024-04-03 DIAGNOSIS — Z9884 Bariatric surgery status: Secondary | ICD-10-CM

## 2024-04-03 DIAGNOSIS — Z6835 Body mass index (BMI) 35.0-35.9, adult: Secondary | ICD-10-CM

## 2024-04-03 MED ORDER — OZEMPIC (2 MG/DOSE) 8 MG/3ML ~~LOC~~ SOPN: 2.0000 mg | PEN_INJECTOR | SUBCUTANEOUS | 1 refills | Status: DC

## 2024-04-03 NOTE — Progress Notes (Signed)
 Office: [phone]  /  Fax: [phone]  WEIGHT SUMMARY AND BIOMETRICS  Starting Date: 08/20/23  Starting Weight: 212lb   Weight Lost Since Last Visit: 5lb   Vitals Temp: 98 F (36.7 C) BP: 124/80 Pulse Rate: 75 SpO2: 99 %   Body Composition  Body Fat %: 44.5 % Fat Mass (lbs): 88.4 lbs Muscle Mass (lbs): 104.6 lbs Total Body Water  (lbs): 77.6 lbs Visceral Fat Rating : 12    HPI  Chief Complaint: OBESITY  [name] is here to discuss her progress with her obesity treatment plan. She is on the the Category 2 Plan and states she is following her eating plan approximately 95 % of the time. She states she is exercising 30 minutes 5-6 times per week.   Interval History:  Since last office visit she is down 5 lb  She is down 0.2 lb of muscle mass and down 3.6 lb of body fat This gives her a net weight loss of 14 lb in 7 mos of medically supervised weight management This is a 6.6% TBW loss She has ramped up her workouts to running, dancing and weight training She has low back pain with L sided radiculopathy, worsened lately Denies weakness, numbness or tingling in legs with MS She has improved satiety without GI upset on Ozempic  2 mg weekly She is mindful of her food choices She is feeling her weight loss in her clothes  Pharmacotherapy: Ozempic  2 mg weekly  PHYSICAL EXAM:  Blood pressure 124/80, pulse 75, temperature 98 F (36.7 C), height 5\' 3"  (1.6 m), weight 198 lb (89.8 kg), last menstrual period 02/20/2024, SpO2 99%. Body mass index is 35.07 kg/m.  General: She is overweight, cooperative, alert, well developed, and in mild distress, standing and holding lower back during visit PSYCH: Has normal mood, affect and thought process.   Lungs: Normal breathing effort, no conversational dyspnea.   ASSESSMENT AND PLAN  TREATMENT PLAN FOR OBESITY:  Recommended Dietary Goals  [name] is currently in the action stage of change. As such, her goal is to continue  weight management plan. She has agreed to the Category 2 Plan and keeping a food journal and adhering to recommended goals of 1200 calories and 90-100 g of  protein.  Behavioral Intervention  We discussed the following Behavioral Modification Strategies today: increasing lean protein intake to established goals, increasing fiber rich foods, avoiding skipping meals, increasing water  intake , work on meal planning and preparation, work on tracking and journaling calories using tracking application, keeping healthy foods at home, decreasing eating out or consumption of processed foods, and making healthy choices when eating convenient foods, practice mindfulness eating and understand the difference between hunger signals and cravings, avoiding temptations and identifying enticing environmental cues, continue to practice mindfulness when eating, planning for success, and continue to work on maintaining a reduced calorie state, getting the recommended amount of protein, incorporating whole foods, making healthy choices, staying well hydrated and practicing mindfulness when eating..  Additional resources provided today: NA  Recommended Physical Activity Goals  [name] has been advised to work up to 150 minutes of moderate intensity aerobic activity a week and strengthening exercises 2-3 times per week for cardiovascular health, weight loss maintenance and preservation of muscle mass.   She has agreed to Work on scheduling and tracking physical activity.  Avoid running and heavy weight training due to flare up of lower back pain with radiculopathy OK to continue walking, body weight training, core workouts  Pharmacotherapy changes for the  treatment of obesity: none  ASSOCIATED CONDITIONS ADDRESSED TODAY  Lumbar disc disease with radiculopathy Worsening pain without trauma She did start running which may have triggered a flare up of pain L spine MRI reviewed from 06/25/21 shows:  IMPRESSION: This MRI  of the lumbar spine without contrast shows the following: 1.   At L4-L5, there is 1 to 2 mm anterolisthesis associated with facet hypertrophy and bulging of the uncovered disc.  This causes mild to moderate right greater than left foraminal and lateral recess stenosis though there does not appear to be nerve root compression.  Compared to the 2020 MRI, joint effusions are no longer noted but the anterolisthesis has developed.  2.  At L5-S1, there is a stable small left paramedian disc protrusion and mild facet hypertrophy causing moderate left lateral recess stenosis though the S1 nerve root does not appear to be compressed.   She avoids NSAIDs and has not felt improvements using Tylenol  Previous corticosteroid injections and PT did not alleviate her pain With her ramping up exercise, a referral to sports medicine will be made  -     Ambulatory referral to Sports Medicine  Type 2 diabetes mellitus without complication, without long-term current use of insulin  (HCC) -     Ozempic  (2 MG/DOSE); Inject 2 mg into the skin once a week.  Dispense: 3 mL; Refill: 1 Lab Results  Component Value Date   HGBA1C 5.5 08/30/2023   She is not checking blood sugars and has done well on Ozempic  2 mg weekly which has also help with satiety and weight loss.  She is doing well with prescribed diet and increased physical activity.   Class 2 severe obesity due to excess calories with serious comorbidity and body mass index (BMI) of 35.0 to 35.9 in adult [facility])  Status post laparoscopic sleeve gastrectomy She is past her previous nadir weight post VSG and feels happy with her progress Reviewed daily calorie and protein targets She is welcome to track intake to make sure she is getting in adequate amounts Hydrate well with water  between meals Stay on a MVI daily     She was informed of the importance of frequent follow up visits to maximize her success with intensive lifestyle modifications for her multiple  health conditions.   ATTESTASTION STATEMENTS:  Reviewed by clinician on day of visit: allergies, medications, problem list, medical history, surgical history, family history, social history, and previous encounter notes pertinent to obesity diagnosis.   I have personally spent 30 minutes total time today in preparation, patient care, nutritional counseling and education,  and documentation for this visit, including the following: review of most recent clinical lab tests, prescribing medications/ refilling medications, reviewing medical assistant documentation, review and interpretation of bioimpedence results.     Micky Albee, D.O. DABFM, DABOM [facility] [address] [place], [place] [address] [phone]

## 2024-04-10 NOTE — Progress Notes (Unsigned)
 PATIENT: [name] DOB: [birth year]  REASON FOR VISIT: follow up HISTORY FROM: patient  No chief complaint on file.   Dr Salli Crawley: MS Dr Omar Bibber: sleep  HISTORY OF PRESENT ILLNESS:  04/10/24 ALL:  [name] returns for follow up for OSA on CPAP.  She continues to do well on therapy. She is using CPAP nightly for about 3-4 hours, on average. She admits that she will often fall asleep before starting therapy. Sometimes she wakes up and has pulled her mask off. She denies concerns with machine or supplies. She has chronic insomnia. She usually only sleeps about 4-5 hours each night. She uses melatonin as needed but not sure it is very effective.   She has follow up with Dr Salli Crawley 05/2024.   05/02/2023 ALL:  [name] is a 53 y.o. female here today for follow up for OSA on CPAP.  She continues to do well on therapy. She is using CPAP nightly for about 3-4 hours, on average. She admits that she will often fall asleep before starting therapy. Sometimes she wakes up and has pulled her mask off. She denies concerns with machine or supplies. She has chronic insomnia. She usually only sleeps about 4-5 hours each night. She uses melatonin as needed but not sure it is very effective.   She continues regular follow up with Dr Salli Crawley. She is on Ocrevus . She has follow up scheduled 11/2023. She reports doing well. She was seen by PCP, yesterday, and prescribed Wegovy  for weight management.     HISTORY: (copied from Dr Dail Drought previous note)  [name] is a 53 year old right-handed woman with an underlying medical history of sickle cell trait, anemia, allergies, anxiety, depression, hypertension, migraine headaches, MS (followed by my colleague, Dr. Salli Crawley), prediabetes and morbid obesity with a BMI of over 45, with recent status post weight loss surgery, who presents for follow-up consultation of her obstructive sleep apnea after interim sleep testing and starting a new AutoPap machine.   The patient is unaccompanied today.  I first met her at the request of her primary care provider on 01/13/2022, at which time she reported a prior diagnosis of sleep apnea and she was on AutoPap therapy at the time.  She had weight loss surgery in late January 2023 and had lost quite a bit of weight.  We proceeded with reevaluation with a sleep study.  She had a baseline sleep study on 01/17/2022 which showed an AHI of 30.3/h, O2 nadir 85%.  She was advised to proceed with AutoPap therapy with a new machine.  Her set up date was 02/10/2022.  She has a ResMed air sense 11 AutoSet machine.   Today, 04/28/2022: I reviewed her AutoPap compliance data from 03/28/2022 through 04/26/2022, which is a total of 30 days, during which time she used her machine every night with percent use days greater than 4 hours at 97%, indicating excellent compliance with an average usage of 6 hours and 51 minutes, residual AHI at goal at 0.4/h, average pressure for the 95th percentile at 11.6 cm with a range of 6 to 12 cm, leak acceptable with the 95th percentile at 9.1 L/min.  She reports doing well.  She uses nasal pillows.  She has adjusted well to her new machine and has had ongoing weight loss.  She follows regularly with her nutritionist.  She hydrates well with water  but still has some mouth dryness.  She benefits from treatment and is very motivated to continue with her AutoPap.  She has noticed an increase in her front tooth gap and she has an appointment to discuss Invisalign treatment.  REVIEW OF SYSTEMS: Out of a complete 14 system review of symptoms, the patient complains only of the following symptoms, fatigue, insomnia and all other reviewed systems are negative.   ALLERGIES: Allergies  Allergen Reactions   Iron Shortness Of Breath    IV only    Tysabri  [Natalizumab ] Other (See Comments)    Shortness of breath, joint pain, tremors   Delsym [Dextromethorphan Polistirex Er] Other (See Comments)    nightmares    Dextromethorphan Other (See Comments)    nightmares   Nsaids     Other Reaction(s): Other  S/p bariatric surgery    HOME MEDICATIONS: Outpatient Medications Prior to Visit  Medication Sig Dispense Refill   amLODipine  (NORVASC ) 5 MG tablet Take 1 tablet (5 mg total) by mouth daily. 90 tablet 3   BIOTIN PO Place 1 mL under the tongue daily. With Collagen (15000 mg per drop)     calcium  carbonate (TUMS EX) 750 MG chewable tablet Chew 1 tablet by mouth daily.     cholecalciferol  (VITAMIN D3) 25 MCG (1000 UNIT) tablet Take 1,000 Units by mouth daily.     IRON, FERROUS GLUCONATE , PO Take by mouth.     Lactic Ac-Citric Ac-Pot Bitart (PHEXXI ) 1.8-1-0.4 % GEL Place 1 suppository vaginally at bedtime. Place one hour PV before intercourse 5 g 12   metroNIDAZOLE  (FLAGYL ) 500 MG tablet Take 1 tablet (500 mg total) by mouth 2 (two) times daily. 14 tablet 0   Multiple Vitamins-Minerals (BARIATRIC FUSION) CHEW Chew 1 each by mouth daily.     ocrelizumab  (OCREVUS ) 300 MG/10ML injection Inject 20 mLs (600 mg total) into the vein See admin instructions. Every 6 months 20 mL 0   pantoprazole  (PROTONIX ) 40 MG tablet Take 40 mg by mouth daily.     Semaglutide , 2 MG/DOSE, (OZEMPIC , 2 MG/DOSE,) 8 MG/3ML SOPN Inject 2 mg into the skin once a week. 3 mL 1   terbinafine  (LAMISIL ) 250 MG tablet Take 1 tablet (250 mg total) by mouth daily. 90 tablet 0   terbinafine  (LAMISIL ) 250 MG tablet Take 1 tablet (250 mg total) by mouth daily. 90 tablet 0   tiZANidine  ([name] ) 4 MG tablet Take 1 tablet (4 mg total) by mouth every 6 (six) hours as needed for up to 10 days 30 tablet 2   valACYclovir  (VALTREX ) 1000 MG tablet Take one tablet by mouth 2 times daily. Start with first symptom of outbreak. (Patient taking differently: as needed.) 14 tablet 0   No facility-administered medications prior to visit.    PAST MEDICAL HISTORY: Past Medical History:  Diagnosis Date   Allergy    Anemia 2008   Anxiety    Arthritis     Back pain    Celiac disease    Chest pain    Constipation    Depression    GERD (gastroesophageal reflux disease)    High cholesterol    Hypertension    Hypothyroidism    Iron overload, transfusional    Joint pain    Lactose intolerance    Migraine    MS (multiple sclerosis) (HCC)    OSA on CPAP    C-PAP   Pre-diabetes    Sickle cell trait (HCC)    Swelling of lower extremity    Tachycardia    Vitamin D  deficiency     PAST SURGICAL HISTORY: Past Surgical History:  Procedure Laterality Date  CERVICAL ABLATION     CESAREAN SECTION     3 times   CHOLECYSTECTOMY  1990   HIATAL HERNIA REPAIR N/A 12/13/2021   Procedure: HERNIA REPAIR HIATAL;  Surgeon: Jacolyn Matar, MD;  Location: [facility];  Service: General;  Laterality: N/A;   LAPAROSCOPIC GASTRIC SLEEVE RESECTION WITH HIATAL HERNIA REPAIR  12/13/2021   LASER ABLATION OF THE CERVIX     PARATHYROIDECTOMY N/A 07/24/2023   Procedure: NECK EXPLORATION WITH PARATHYROIDECTOMY;  Surgeon: Oralee Billow, MD;  Location: [facility];  Service: General;  Laterality: N/A;   THYROID  SURGERY     TUBAL LIGATION  1995   UPPER GI ENDOSCOPY N/A 12/13/2021   Procedure: UPPER GI ENDOSCOPY;  Surgeon: Jacolyn Matar, MD;  Location: [facility];  Service: General;  Laterality: N/A;    FAMILY HISTORY: Family History  Problem Relation Age of Onset   Mental illness Mother    Hyperlipidemia Mother    Hyperthyroidism Mother    Mental retardation Mother    Other Mother        Covid   Sudden death Mother    Thyroid  disease Mother    Depression Mother    Anxiety disorder Mother    Bipolar disorder Mother    Schizophrenia Mother    Diabetes Father    Cancer Father    Sudden death Father    Kidney disease Father    Liver disease Father    Cancer Paternal Aunt        breast   Cancer Paternal Aunt        breast   Arthritis Maternal Grandmother    Diabetes Maternal Grandmother    Hearing loss Maternal Grandmother        left hear when she was a  child   Hyperlipidemia Maternal Grandmother    Hypertension Maternal Grandmother    Alcohol abuse Maternal Grandfather    Early death Maternal Grandfather    Multiple sclerosis Maternal Grandfather    ADD / ADHD Son    Multiple sclerosis Cousin    Breast cancer Neg Hx    Colon cancer Neg Hx    Esophageal cancer Neg Hx    Inflammatory bowel disease Neg Hx    Pancreatic cancer Neg Hx    Stomach cancer Neg Hx    Rectal cancer Neg Hx    Sleep apnea Neg Hx     SOCIAL HISTORY: 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, answers calls    Employer: [employer]    Comment: [employer]  Tobacco Use   Smoking status: Former    Current packs/day: 0.00    Average packs/day: 0.5 packs/day for 25.0 years (12.5 ttl pk-yrs)    Types: Cigarettes    Start date: 09/16/1996    Quit date: 09/16/2021    Years since quitting: 2.5   Smokeless tobacco: Never   Tobacco comments:    12/25/18  quit 2 months ago        Vapes daily   Vaping Use   Vaping status: Some Days  Substance and Sexual Activity   Alcohol use: Yes    Comment: occ   Drug use: No   Sexual activity: Yes    Partners: Male    Birth control/protection: None  Other Topics Concern  Not on file  Social History Narrative   Patient lives with children    Pt not working    Social Drivers of Corporate investment banker Strain: Low Risk  (10/22/2023)   Received from [facility]   Overall Financial Resource Strain (CARDIA)    Difficulty of Paying Living Expenses: Not very hard  Food Insecurity: No Food Insecurity (10/22/2023)   Received from [facility]   Hunger Vital Sign    Worried About Running Out of Food in the Last Year: Never true    Ran Out of Food in the Last Year: Never true  Transportation Needs: No  Transportation Needs (10/22/2023)   Received from [facility] - Transportation    Lack of Transportation (Medical): No    Lack of Transportation (Non-Medical): No  Physical Activity: Unknown (10/22/2023)   Received from [facility]   Exercise Vital Sign    Days of Exercise per Week: 4 days    Minutes of Exercise per Session: Not on file  Stress: No Stress Concern Present (10/22/2023)   Received from [facility] - Occupational Stress Questionnaire    Feeling of Stress : Only a little  Social Connections: Moderately Integrated (10/22/2023)   Received from [name]   Social Network    How would you rate your social network (family, work, friends)?: Adequate participation with social networks  Intimate Partner Violence: Not At Risk (10/22/2023)   Received from [facility]   HITS    Over the last 12 months how often did your partner physically hurt you?: Never    Over the last 12 months how often did your partner insult you or talk down to you?: Never    Over the last 12 months how often did your partner threaten you with physical harm?: Never    Over the last 12 months how often did your partner scream or curse at you?: Never     PHYSICAL EXAM  There were no vitals filed for this visit.  There is no height or weight on file to calculate BMI.  Generalized: Well developed, in no acute distress  Cardiology: normal rate and rhythm, no murmur noted Respiratory: clear to auscultation bilaterally  Neurological examination  Mentation: Alert oriented to time, place, history taking. Follows all commands speech and language fluent Cranial nerve II-XII: Pupils were equal round reactive to light. Extraocular movements were full, visual field were full  Motor: The motor testing reveals 5 over 5 strength of all 4 extremities. Good symmetric motor tone is noted throughout.  Gait and station: Gait is normal.    DIAGNOSTIC DATA (LABS,  IMAGING, TESTING) - I reviewed patient records, labs, notes, testing and imaging myself where available.      No data to display           Lab Results  Component Value Date   WBC 7.2 08/23/2023   HGB 12.9 08/23/2023   HCT 44.6 08/23/2023   MCV 74 (L) 08/23/2023   PLT 243 08/23/2023      Component Value Date/Time   NA 142 08/23/2023 0953   K 4.1 08/23/2023 0953   CL 104 08/23/2023 0953   CO2 21 08/23/2023 0953   GLUCOSE 88 08/23/2023 0953   GLUCOSE 104 (H) 07/12/2023 1026   BUN 10 08/23/2023 0953   CREATININE 0.68 08/23/2023 0953   CREATININE 0.75 11/11/2015 0828   CALCIUM  9.8 08/23/2023 0953   PROT 6.5 02/08/2024 1036  ALBUMIN 4.3 02/08/2024 1036   AST 12 02/08/2024 1036   ALT 12 02/08/2024 1036   ALKPHOS 94 02/08/2024 1036   BILITOT <0.2 02/08/2024 1036   GFRNONAA >60 07/12/2023 1026   GFRNONAA >89 11/11/2015 0828   GFRAA 90 01/01/2019 1614   GFRAA >89 11/11/2015 0828   Lab Results  Component Value Date   CHOL 177 08/30/2023   HDL 62 08/30/2023   LDLCALC 98 08/30/2023   TRIG 78 08/30/2023   CHOLHDL 2.9 08/30/2023   Lab Results  Component Value Date   HGBA1C 5.5 08/30/2023   Lab Results  Component Value Date   VITAMINB12 637 08/30/2023   Lab Results  Component Value Date   TSH 0.56 08/30/2023     ASSESSMENT AND PLAN 53 y.o. year old female  has a past medical history of Allergy, Anemia (2008), Anxiety, Arthritis, Back pain, Celiac disease, Chest pain, Constipation, Depression, GERD (gastroesophageal reflux disease), High cholesterol, Hypertension, Hypothyroidism, Iron overload, transfusional, Joint pain, Lactose intolerance, Migraine, MS (multiple sclerosis) (HCC), OSA on CPAP, Pre-diabetes, Sickle cell trait (HCC), Swelling of lower extremity, Tachycardia, and Vitamin D  deficiency. here with     ICD-10-CM   1. OSA on CPAP  G47.33         VELITA QUIRK is doing well on CPAP therapy. Compliance report reveals excellent daily but sub optimal  four hour compliance. She was encouraged to continue using CPAP nightly and for greater than 4 hours each night. We will update supply orders as indicated. Risks of untreated sleep apnea review and education materials provided. We have reviewed sleep hygiene and educational info provided. MS symptoms well managed. She will continue Ocrevus  per Dr Salli Crawley. Healthy lifestyle habits encouraged. She will follow up with me in 1 year, sooner if needed. She verbalizes understanding and agreement with this plan.    No orders of the defined types were placed in this encounter.    No orders of the defined types were placed in this encounter.     Terrilyn Fick, FNP-C 04/10/2024, 4:07 PM [facility] [address] [place], [place] [address] [phone]

## 2024-04-10 NOTE — Patient Instructions (Incomplete)
 Please continue using your CPAP regularly. While your insurance requires that you use CPAP at least 4 hours each night on 70% of the nights, I recommend, that you not skip any nights and use it throughout the night if you can. Getting used to CPAP and staying with the treatment long term does take time and patience and discipline. Untreated obstructive sleep apnea when it is moderate to severe can have an adverse impact on cardiovascular health and raise her risk for heart disease, arrhythmias, hypertension, congestive heart failure, stroke and diabetes. Untreated obstructive sleep apnea causes sleep disruption, nonrestorative sleep, and sleep deprivation. This can have an impact on your day to day functioning and cause daytime sleepiness and impairment of cognitive function, memory loss, mood disturbance, and problems focussing. Using CPAP regularly can improve these symptoms.  We will update supply orders, today. Consider ashwagandha or L-theanine for sleep aid pending    Follow up in 1 year

## 2024-04-11 NOTE — Progress Notes (Unsigned)
 [name]

## 2024-04-12 ENCOUNTER — Encounter: Payer: Self-pay | Admitting: Family Medicine

## 2024-04-12 ENCOUNTER — Ambulatory Visit: Admitting: Family Medicine

## 2024-04-12 VITALS — BP 130/80 | HR 72 | Wt 198.0 lb

## 2024-04-12 DIAGNOSIS — Z79899 Other long term (current) drug therapy: Secondary | ICD-10-CM | POA: Diagnosis not present

## 2024-04-12 DIAGNOSIS — G4733 Obstructive sleep apnea (adult) (pediatric): Secondary | ICD-10-CM

## 2024-04-12 DIAGNOSIS — G35 Multiple sclerosis: Secondary | ICD-10-CM | POA: Diagnosis not present

## 2024-04-12 DIAGNOSIS — R634 Abnormal weight loss: Secondary | ICD-10-CM

## 2024-05-02 ENCOUNTER — Ambulatory Visit: Payer: Medicare Other | Admitting: Family Medicine

## 2024-05-07 ENCOUNTER — Telehealth: Payer: Self-pay | Admitting: *Deleted

## 2024-05-07 ENCOUNTER — Ambulatory Visit (INDEPENDENT_AMBULATORY_CARE_PROVIDER_SITE_OTHER): Admitting: Family Medicine

## 2024-05-07 ENCOUNTER — Encounter: Payer: Self-pay | Admitting: Family Medicine

## 2024-05-07 VITALS — BP 136/85 | HR 71 | Temp 98.3°F | Ht 63.0 in | Wt 198.0 lb

## 2024-05-07 DIAGNOSIS — M51361 Other intervertebral disc degeneration, lumbar region with lower extremity pain only: Secondary | ICD-10-CM | POA: Diagnosis not present

## 2024-05-07 DIAGNOSIS — Z7985 Long-term (current) use of injectable non-insulin antidiabetic drugs: Secondary | ICD-10-CM

## 2024-05-07 DIAGNOSIS — Z6836 Body mass index (BMI) 36.0-36.9, adult: Secondary | ICD-10-CM

## 2024-05-07 DIAGNOSIS — E66812 Obesity, class 2: Secondary | ICD-10-CM | POA: Diagnosis not present

## 2024-05-07 DIAGNOSIS — E119 Type 2 diabetes mellitus without complications: Secondary | ICD-10-CM | POA: Diagnosis not present

## 2024-05-07 DIAGNOSIS — M5116 Intervertebral disc disorders with radiculopathy, lumbar region: Secondary | ICD-10-CM

## 2024-05-07 DIAGNOSIS — Z9884 Bariatric surgery status: Secondary | ICD-10-CM

## 2024-05-07 DIAGNOSIS — E559 Vitamin D deficiency, unspecified: Secondary | ICD-10-CM | POA: Diagnosis not present

## 2024-05-07 MED ORDER — TIRZEPATIDE 10 MG/0.5ML ~~LOC~~ SOAJ: 10.0000 mg | SUBCUTANEOUS | 0 refills | Status: DC

## 2024-05-07 NOTE — Telephone Encounter (Signed)
 Phone call to Dr. ONEIDA office to follow up on patients referral for sports medicine. No answer phones are down. Faxed over referral. Will try to call back.

## 2024-05-07 NOTE — Progress Notes (Signed)
 Office: [phone]  /  Fax: [phone]  WEIGHT SUMMARY AND BIOMETRICS  Starting Date: 08/20/23  Starting Weight: 212lb   Weight Lost Since Last Visit: 0lb   Vitals Temp: 98.3 F (36.8 C) BP: 136/85 Pulse Rate: 71 SpO2: 98 %   Body Composition  Body Fat %: 44.4 % Fat Mass (lbs): 88.2 lbs Muscle Mass (lbs): 105 lbs Total Body Water  (lbs): 77.2 lbs Visceral Fat Rating : 12     HPI  Chief Complaint: OBESITY  [name] is here to discuss her progress with her obesity treatment plan. She is on the the Category 2 Plan and states she is following her eating plan approximately 90 % of the time. She states she is exercising 30-60 minutes 5-6 times per week.  Interval History:  Since last office visit she is down 0 pounds She is up 0.4 pounds of muscle mass and down 0.2 pounds of body fat since last visit This gives her a net weight loss of 14 pounds in the past 8 months of medically supervised weight management She is currently at her nadir weight post vertical sleeve gastrectomy with Dr. Gladis with a max weight of 350 pounds with a surgery date of 12/13/2021 She remains on Ozempic  2 mg once weekly injection for type 2 diabetes She has been feeling less improvement in satiety without adverse side effects Denies meal skipping Food cravings including sugar cravings have reduced She has been trying out yoga and doing more run walk intervals, having completed her first 5K this past weekend Left-sided sciatica with lumbar radiculopathy does limit her movement and she has not yet been called for sports medicine referral  Pharmacotherapy: Ozempic  2 mg once weekly injection  PHYSICAL EXAM:  Blood pressure 136/85, pulse 71, temperature 98.3 F (36.8 C), height 5' 3 (1.6 m), weight 198 lb (89.8 kg), SpO2 98%. Body mass index is 35.07 kg/m.  General: She is overweight, cooperative, alert, well developed, and in no acute distress. PSYCH: Has normal mood, affect and thought  process.   Lungs: Normal breathing effort, no conversational dyspnea.   ASSESSMENT AND PLAN  TREATMENT PLAN FOR OBESITY:  Recommended Dietary Goals  [name] is currently in the action stage of change. As such, her goal is to continue weight management plan. She has agreed to keeping a food journal and adhering to recommended goals of 1300 calories and 90+ grams of protein and following a lower carbohydrate, vegetable and lean protein rich diet plan.  Behavioral Intervention  We discussed the following Behavioral Modification Strategies today: increasing lean protein intake to established goals, increasing fiber rich foods, avoiding skipping meals, increasing water  intake , work on meal planning and preparation, work on Counselling psychologist calories using tracking application, keeping healthy foods at home, identifying sources and decreasing liquid calories, continue to work on implementation of reduced calorie nutritional plan, continue to practice mindfulness when eating, planning for success, and continue to work on maintaining a reduced calorie state, getting the recommended amount of protein, incorporating whole foods, making healthy choices, staying well hydrated and practicing mindfulness when eating..  Additional resources provided today: NA  Recommended Physical Activity Goals  [name] has been advised to work up to 150 minutes of moderate intensity aerobic activity a week and strengthening exercises 2-3 times per week for cardiovascular health, weight loss maintenance and preservation of muscle mass.   She has agreed to [employer] strengthening exercises with a goal of 2-3 sessions a week  and Increase the intensity, frequency or duration  of aerobic exercises   Congratulated her on completing her first 5K Awaiting referral to sports medicine given lumbar radiculopathy with limitations  Pharmacotherapy changes for the treatment of obesity: Discontinue Ozempic  and changed to Mounjaro 10  mg once weekly injection  ASSOCIATED CONDITIONS ADDRESSED TODAY  Type 2 diabetes mellitus without complication, without long-term current use of insulin  (HCC) She has good control of her GERD type 2 diabetes with A1c reduction from 6.8-5.5.  She has done well on Ozempic  2 mg once weekly injection but is upset about getting a plateau with weight loss and has been much satiety over the past month.  She has tolerated Ozempic  well without adverse side effects and is interested in switching to Mounjaro.  She will complete her current prescription for Ozempic  2 mg once weekly injection and then switch over to Mounjaro at 10 mg once weekly injection.  Continue to work on a low sugar, high-protein post-bariatric surgery diet with regular exercise.  -     Tirzepatide; Inject 10 mg into the skin once a week.  Dispense: 2 mL; Refill: 0  Class 2 severe obesity due to excess calories with serious comorbidity and body mass index (BMI) of 36.0 to 36.9 in adult [facility])  Lumbar disc disease with radiculopathy She is doing some gentle stretching at home with left-sided radiculopathy radiating to her posterior knee.  Referral was placed to sports medicine but patient has not yet been called.  Will call to schedule this visit for her.  Status post laparoscopic sleeve gastrectomy She has done well achieving her current nadir weight of 198 pounds post vertical sleeve gastrectomy with improved satiety at mealtime and focusing more on lean protein with meals.  She is also working on improving her water  intake between meals and avoiding carbonated beverages.  She has cut out high sugar foods and drinks.  She is consistent with taking a multivitamin daily and had annual bariatric labs updated by her PCP at [facility] dated 04/24/2024, reviewed today.  Vitamin D  deficiency Improving.  Her vitamin D  level remains low normal with labs reviewed from her PCP dated 04/24/2024 at 34.8.  She is currently taking OTC vitamin D  1000 IU  once daily.  Recommend increasing her vitamin D  to 2000 IU once daily along with her bariatric multivitamin daily.  Goal vitamin D  level is over 50 to help with energy level, bone health and immune function.  Plan to recheck vitamin D  level in 6 months   She was informed of the importance of frequent follow up visits to maximize her success with intensive lifestyle modifications for her multiple health conditions.   ATTESTASTION STATEMENTS:  Reviewed by clinician on day of visit: allergies, medications, problem list, medical history, surgical history, family history, social history, and previous encounter notes pertinent to obesity diagnosis.   I have personally spent 30 minutes total time today in preparation, patient care, nutritional counseling and education,  and documentation for this visit, including the following: review of most recent clinical lab tests, prescribing medications/ refilling medications, reviewing medical assistant documentation, review and interpretation of bioimpedence results.     Darice Haddock, D.O. DABFM, DABOM [facility] [address] [place], [place] [address] [phone]

## 2024-05-07 NOTE — Patient Instructions (Addendum)
 Increase vitamin D  to 2,000 international units  daily  Finish out all Ozempic  2 mg weekly pens THEN change to Mounjaro 10 mg weekly injection  Referral to sports medicine-->  Chicken salad or tuna salad-- plain greek yogurt + mustard  Great job with exercise!!

## 2024-06-02 ENCOUNTER — Encounter: Payer: Self-pay | Admitting: Obstetrics and Gynecology

## 2024-06-03 NOTE — Telephone Encounter (Signed)
 Hx of recurrent vaginitis. Last OV 01/11/24 Last AEX 08/30/23. Last treated with Flagyl  03/06/24  Please advise if OV needed.

## 2024-06-04 ENCOUNTER — Other Ambulatory Visit: Payer: Self-pay | Admitting: Nurse Practitioner

## 2024-06-04 DIAGNOSIS — N760 Acute vaginitis: Secondary | ICD-10-CM

## 2024-06-04 MED ORDER — METRONIDAZOLE 500 MG PO TABS: 500.0000 mg | ORAL_TABLET | Freq: Two times a day (BID) | ORAL | 0 refills | Status: DC

## 2024-06-11 ENCOUNTER — Encounter: Payer: Self-pay | Admitting: Diagnostic Neuroimaging

## 2024-06-11 ENCOUNTER — Ambulatory Visit (INDEPENDENT_AMBULATORY_CARE_PROVIDER_SITE_OTHER): Payer: 59 | Admitting: Diagnostic Neuroimaging

## 2024-06-11 VITALS — BP 138/82 | HR 76 | Ht 63.0 in | Wt 201.0 lb

## 2024-06-11 DIAGNOSIS — G35 Multiple sclerosis: Secondary | ICD-10-CM

## 2024-06-11 NOTE — Progress Notes (Signed)
 Chief Complaint  Patient presents with   Multiple Sclerosis    Rm 6 alone Pt is well, reports occasional dizziness and muscle cramps in hips. No other concerns.     History of Present Illness:  UPDATE (06/11/24, VRP): Since last visit, doing well. Working out at gym 5 x per week. Some delayed on set muscle soreness from pushing too hard. Tolerating ocrevus .   UPDATE (12/12/23, VRP): Since last visit, doing well. Symptoms are stable to improved. Has lost weight since gastric sleeve in 2013; now on ozempic  since 3 months. Overall doing well.  UPDATE (01/10/23, VRP): Since last visit, doing well. Symptoms are stable. Severity mild. No alleviating or aggravating factors. Tolerating ocrevus .    UPDATE (01/10/22, VRP): Since last visit, doing well. Had gastric sleeve surg in 12/13/21, and already lost 30lbs. Symptoms are stable overall. Some dizziness with standing.   UPDATE (06/14/21, VRP): Since last visit, doing well. Symptoms are stable overall, except right leg now hurting more. Left leg pain stable. Tolerating ocrevus .     UPDATE (11/10/20, VRP): Since last visit, doing about the same. Symptoms are stable. Severity is moderate. No alleviating or aggravating factors. Tolerating meds. Had some positional dizziness, but improved with hydration.  UPDATE (04/28/20, VRP): Since last visit, had to leave work due to long term disability expiring. Applying for medicaid. MS symptoms stable. HA stable. However, back and left leg pain are worsened. Not able to work because of low back pain and left leg pain, cannot sit for prolonged time and severe pain. Not driving right now. More depression.  PRIOR HPI:  - MS sxs are stable; tolerating ocrevus  - left sciatica pain is stable; continues to struggle with pain mgmt; has tried [facility] without relief; now seeing chiropractor and doing better - mood, nutrition, exercise are improving - has been out of work since Feb 2020    Observations/Objective:  GENERAL  EXAM/CONSTITUTIONAL: Vitals:  Vitals:   06/11/24 1401  BP: 138/82  Pulse: 76  Weight: 201 lb (91.2 kg)  Height: 5' 3 (1.6 m)   Body mass index is 35.61 kg/m. Wt Readings from Last 3 Encounters:  06/11/24 201 lb (91.2 kg)  05/07/24 198 lb (89.8 kg)  04/12/24 198 lb (89.8 kg)   Patient is in no distress; well developed, nourished and groomed; neck is supple  CARDIOVASCULAR: Examination of carotid arteries is normal; no carotid bruits Regular rate and rhythm, no murmurs Examination of peripheral vascular system by observation and palpation is normal  EYES: Ophthalmoscopic exam of optic discs and posterior segments is normal; no papilledema or hemorrhages No results found.  MUSCULOSKELETAL: Gait, strength, tone, movements noted in Neurologic exam below  NEUROLOGIC: MENTAL STATUS:      No data to display         awake, alert, oriented to person, place and time recent and remote memory intact normal attention and concentration language fluent, comprehension intact, naming intact fund of knowledge appropriate  CRANIAL NERVE:  2nd - no papilledema on fundoscopic exam 2nd, 3rd, 4th, 6th - pupils equal and reactive to light, visual fields full to confrontation, extraocular muscles intact, no nystagmus 5th - facial sensation symmetric 7th - facial strength symmetric 8th - hearing intact 9th - palate elevates symmetrically, uvula midline 11th - shoulder shrug symmetric 12th - tongue protrusion midline  MOTOR:  normal bulk and tone, full strength in the BUE BLE 4 prox, 5 distal  SENSORY:  normal and symmetric to light touch, temperature, vibration  COORDINATION:  finger-nose-finger, fine finger movements normal  REFLEXES:  deep tendon reflexes 1+ and symmetric  GAIT/STATION:  narrow based gait  Lab Results  Component Value Date   WBC 7.2 08/23/2023   HGB 12.9 08/23/2023   HCT 44.6 08/23/2023   MCV 74 (L) 08/23/2023   PLT 243 08/23/2023       Latest  Ref Rng & Units 02/08/2024   10:36 AM 08/23/2023    9:53 AM 07/25/2023    4:52 AM  CMP  Glucose 70 - 99 mg/dL  88    BUN 6 - 24 mg/dL  10    Creatinine 9.42 - 1.00 mg/dL  9.31    Sodium 865 - 855 mmol/L  142    Potassium 3.5 - 5.2 mmol/L  4.1    Chloride 96 - 106 mmol/L  104    CO2 20 - 29 mmol/L  21    Calcium  8.7 - 10.2 mg/dL  9.8  9.1   Total Protein 6.0 - 8.5 g/dL 6.5  7.1    Total Bilirubin 0.0 - 1.2 mg/dL <9.7  0.3    Alkaline Phos 44 - 121 IU/L 94  110    AST 0 - 40 IU/L 12  18    ALT 0 - 32 IU/L 12  14       01/07/19 MRI brain  - No change since September of 2018. Multiple cerebral hemispheric white matter lesions consistent with chronic multiple sclerosis. No new or progressive lesions. No lesions show  restricted diffusion or contrast enhancement.  01/07/19 MRI lumbar spine - L4-5: Bilateral facet arthropathy with gaping, fluid-filled joints. Mild biforaminal disc bulges. No visible neural compression in this position. With standing or flexion, the patient could develop anterolisthesis, which could be significant. Facet arthropathy could certainly be a cause of back pain or referred facet syndrome pain as well.  - L5-S1: Small left paracentral disc protrusion which approaches the left S1 nerve but does not visibly compress or displace it. It is possible this could be associated with left S1 nerve irritation. Mild facet osteoarthritis at this leel could contribute to back pain.    06/25/21 MRI of the brain with and without contrast shows the following: 1.   Multiple T2/FLAIR hyperintense foci in the hemispheres in a pattern and configuration consistent with chronic demyelinating plaque associated with multiple sclerosis.  None of the foci appear to be acute.  They do not enhance.  Compared to the MRI dated 02/05/2019, there are no new lesions 2.   5 to 6 mm right parietal enhancing mass most consistent with a small meningioma, unchanged in appearance compared to the 02/05/2019 MRI. 3.    No acute findings.  06/25/21 MRI of the lumbar spine without contrast shows the following: 1.   At L4-L5, there is 1 to 2 mm anterolisthesis associated with facet hypertrophy and bulging of the uncovered disc.  This causes mild to moderate right greater than left foraminal and lateral recess stenosis though there does not appear to be nerve root compression.  Compared to the 2020 MRI, joint effusions are no longer noted but the anterolisthesis has developed.  2.  At L5-S1, there is a stable small left paramedian disc protrusion and mild facet hypertrophy causing moderate left lateral recess stenosis though the S1 nerve root does not appear to be compressed.   Assessment and Plan:  53 y.o. female with:   Dx:  1. MS (multiple sclerosis) (HCC)     MULTIPLE SCLEROSIS (relapsing, remitting multiple sclerosis;  established, stable) - continue ocrevus  (every March, Sept) - continue exercises and PT - continue vitamin D  - monitor CBC, CMP, Ig levels annually - check MRI brain w/wo   LEFT LEG PAIN / BACK PAIN (persistent; sciatica; ? piriformis syndrome) - continue PT exercises   OBESITY (much improved) - s/p gastric sleeve (12/13/21); now on ozempic ; doing well!   LIGHTHEADED / DIZZINESS (improved) - monitor; maintain hydration  MIGRAINE HEADACHES (established, stable) - monitor for now   MENINGIOMA (small, asymptomatic) - incidental finding; reassured patient  Orders Placed This Encounter  Procedures   Immunoglobulins, QN, A/E/G/M   Follow Up Instructions:  - Return in about 6 months (around 12/12/2024) for with NP (Amy Lomax).    EDUARD FABIENE HANLON, MD 06/11/2024, 4:09 PM Certified in Neurology, Neurophysiology and Neuroimaging  [facility] [address], [address] [place], [place] [address] [phone]

## 2024-06-14 LAB — IMMUNOGLOBULINS A/E/G/M, SERUM
IgA/Immunoglobulin A, Serum: 226 mg/dL (ref 87–352)
IgE (Immunoglobulin E), Serum: 11 [IU]/mL (ref 6–495)
IgG (Immunoglobin G), Serum: 946 mg/dL (ref 586–1602)
IgM (Immunoglobulin M), Srm: 21 mg/dL — ABNORMAL LOW (ref 26–217)

## 2024-06-19 ENCOUNTER — Encounter: Payer: Self-pay | Admitting: Diagnostic Neuroimaging

## 2024-06-19 ENCOUNTER — Ambulatory Visit: Admitting: Family Medicine

## 2024-06-19 ENCOUNTER — Encounter: Payer: Self-pay | Admitting: Family Medicine

## 2024-06-19 VITALS — BP 143/89 | HR 68 | Temp 97.8°F | Ht 63.0 in | Wt 198.0 lb

## 2024-06-19 DIAGNOSIS — E119 Type 2 diabetes mellitus without complications: Secondary | ICD-10-CM

## 2024-06-19 DIAGNOSIS — Z9884 Bariatric surgery status: Secondary | ICD-10-CM

## 2024-06-19 DIAGNOSIS — Z7985 Long-term (current) use of injectable non-insulin antidiabetic drugs: Secondary | ICD-10-CM

## 2024-06-19 DIAGNOSIS — Z6835 Body mass index (BMI) 35.0-35.9, adult: Secondary | ICD-10-CM | POA: Diagnosis not present

## 2024-06-19 DIAGNOSIS — M5116 Intervertebral disc disorders with radiculopathy, lumbar region: Secondary | ICD-10-CM | POA: Diagnosis not present

## 2024-06-19 DIAGNOSIS — E66812 Obesity, class 2: Secondary | ICD-10-CM | POA: Diagnosis not present

## 2024-06-19 MED ORDER — TIRZEPATIDE 10 MG/0.5ML ~~LOC~~ SOAJ: 10.0000 mg | SUBCUTANEOUS | 0 refills | Status: DC

## 2024-06-19 NOTE — Progress Notes (Signed)
 Office: [phone]  /  Fax: [phone]  WEIGHT SUMMARY AND BIOMETRICS  Starting Date: 08/20/23  Starting Weight: 212lb   Weight Lost Since Last Visit: 0lb   Vitals Temp: 97.8 F (36.6 C) BP: (!) 143/89 Pulse Rate: 68 SpO2: 99 %   Body Composition  Body Fat %: 44.9 % Fat Mass (lbs): 89.2 lbs Muscle Mass (lbs): 103.8 lbs Total Body Water  (lbs): 77.4 lbs Visceral Fat Rating : 12    HPI  Chief Complaint: OBESITY  [name] is here to discuss her progress with her obesity treatment plan. She is on the the Category 2 Plan and states she is following her eating plan approximately 90 % of the time. She states she is exercising 60-90 minutes 4-5 times per week.  Interval History:  Since last office visit she is down 0 lb This gives her a net weight loss of 14 lb in 9 mos This is a 6.6% TBW loss She did have to alter her workouts due to her MS She is going to a boxing gym and [employer] She is working out 3-4 x a week She is still having issues with lumbar radiculopathy to the L side - radiating to posterior L knee She feels like her food choices and portion sizes have improved She would like to lose about 20 more pounds She is at her lowest weight post VSG 11/2021  She has the same amount of satiety changing from Ozempic  2 mg to Mounjaro  10 mg weekly  Pharmacotherapy: Mounjaro  10 mg weekly  PHYSICAL EXAM:  Blood pressure (!) 143/89, pulse 68, temperature 97.8 F (36.6 C), height 5' 3 (1.6 m), weight 198 lb (89.8 kg), SpO2 99%. Body mass index is 35.07 kg/m.  General: She is overweight, cooperative, alert, well developed, and in no acute distress. PSYCH: Has normal mood, affect and thought process.   Lungs: Normal breathing effort, no conversational dyspnea.   ASSESSMENT AND PLAN  TREATMENT PLAN FOR OBESITY:  Recommended Dietary Goals  Thetis is currently in the action stage of change. As such, her goal is to continue weight management plan. She has  agreed to the Category 2 Plan and keeping a food journal and adhering to recommended goals of 1300 calories and 90 g of  protein.  Behavioral Intervention  We discussed the following Behavioral Modification Strategies today: increasing lean protein intake to established goals, increasing fiber rich foods, increasing water  intake , work on meal planning and preparation, work on Counselling psychologist calories using tracking application, keeping healthy foods at home, planning for success, better snacking choices, and continue to work on maintaining a reduced calorie state, getting the recommended amount of protein, incorporating whole foods, making healthy choices, staying well hydrated and practicing mindfulness when eating.. Reduce refined carb snacks (crackers)-- overeating!  Additional resources provided today: NA  Recommended Physical Activity Goals  Eran has been advised to work up to 150 minutes of moderate intensity aerobic activity a week and strengthening exercises 2-3 times per week for cardiovascular health, weight loss maintenance and preservation of muscle mass.   She has agreed to Work on scheduling and tracking physical activity.   Pharmacotherapy changes for the treatment of obesity: none  ASSOCIATED CONDITIONS ADDRESSED TODAY  Type 2 diabetes mellitus without complication, without long-term current use of insulin  [facility]) Lab Results  Component Value Date   HGBA1C 5.5 08/30/2023   Improving.  A1c down from 6.8.  tolerating Mounjaro  10 mg weekly well without adverse SE.  Working on reducing  sugar intake but is consuming a larger amount of crackers and cherries.  Has ramped up exercise frequency.   -     Tirzepatide ; Inject 10 mg into the skin once a week.  Dispense: 2 mL; Refill: 0  Class 2 severe obesity due to excess calories with serious comorbidity and body mass index (BMI) of 35.0 to 35.9 in adult [facility]) Improving Weight loss has slowed down  Inflammation from lumbar  radiculopathy and MS flare have hindered progress some  Lumbar disc disease with radiculopathy Referred to sports medicine but never called Continues to have L sided radicular pain  Status post laparoscopic sleeve gastrectomy 10-16 annual bariatric surgery labs updated Doing better with portion control with meals, lean protein intake and fairly consistent exercise Continue a MVI daily, Calcium  + vitamin D  daily Reduced refined carb snacks    She was informed of the importance of frequent follow up visits to maximize her success with intensive lifestyle modifications for her multiple health conditions.   ATTESTASTION STATEMENTS:  Reviewed by clinician on day of visit: allergies, medications, problem list, medical history, surgical history, family history, social history, and previous encounter notes pertinent to obesity diagnosis.   I have personally spent 30 minutes total time today in preparation, patient care, nutritional counseling and education,  and documentation for this visit, including the following: review of most recent clinical lab tests, prescribing medications/ refilling medications, reviewing medical assistant documentation, review and interpretation of bioimpedence results.     Darice Haddock, D.O. DABFM, DABOM [facility] [address] [place], [place] [address] [phone]

## 2024-06-30 ENCOUNTER — Ambulatory Visit: Payer: Self-pay | Admitting: Diagnostic Neuroimaging

## 2024-07-17 ENCOUNTER — Encounter: Payer: Self-pay | Admitting: Family Medicine

## 2024-07-17 ENCOUNTER — Ambulatory Visit (INDEPENDENT_AMBULATORY_CARE_PROVIDER_SITE_OTHER): Admitting: Family Medicine

## 2024-07-17 VITALS — BP 137/90 | HR 71 | Temp 98.2°F | Ht 63.0 in | Wt 195.0 lb

## 2024-07-17 DIAGNOSIS — Z6834 Body mass index (BMI) 34.0-34.9, adult: Secondary | ICD-10-CM | POA: Diagnosis not present

## 2024-07-17 DIAGNOSIS — I10 Essential (primary) hypertension: Secondary | ICD-10-CM

## 2024-07-17 DIAGNOSIS — E66811 Obesity, class 1: Secondary | ICD-10-CM | POA: Diagnosis not present

## 2024-07-17 DIAGNOSIS — Z7985 Long-term (current) use of injectable non-insulin antidiabetic drugs: Secondary | ICD-10-CM

## 2024-07-17 DIAGNOSIS — E119 Type 2 diabetes mellitus without complications: Secondary | ICD-10-CM

## 2024-07-17 DIAGNOSIS — Z9884 Bariatric surgery status: Secondary | ICD-10-CM

## 2024-07-17 MED ORDER — TIRZEPATIDE 12.5 MG/0.5ML ~~LOC~~ SOAJ: 12.5000 mg | SUBCUTANEOUS | 1 refills | Status: DC

## 2024-07-17 NOTE — Patient Instructions (Signed)
 Track calorie intake on the [employer] App Aim for 1500 cal/ day This should include 85-110 g of protein daily [place] job with workouts!  Hydrate well with water  outside of mealtime  Aim for weight training 3 days/ wk Aim for cardio 5 days/ wk  Keep up the good work! Stay on your supplements  Fleurette out Mounjaro  10 mg weekly then increase to 12.5 mg weekly

## 2024-07-17 NOTE — Progress Notes (Signed)
 Office: [phone]  /  Fax: [phone]  WEIGHT SUMMARY AND BIOMETRICS  Starting Date: 08/20/23  Starting Weight: 212lb   Weight Lost Since Last Visit: 3lb   Vitals Temp: 98.2 F (36.8 C) BP: (!) 137/90 Pulse Rate: 71 SpO2: 98 %   Body Composition  Body Fat %: 45.2 % Fat Mass (lbs): 88.2 lbs Muscle Mass (lbs): 101.6 lbs Total Body Water  (lbs): 78.4 lbs Visceral Fat Rating : 12    HPI  Chief Complaint: OBESITY  [name] is here to discuss her progress with her obesity treatment plan. She is on the the Category 2 Plan and states she is following her eating plan approximately 80 % of the time. She states she is exercising 30 minutes 7 times per week.  Interval History:  Since last office visit she is is down 3 lb She is down 2.2 lb of muscle mass and down 1 lb of body fat since last visit This gives her a net weight loss of 17 lb in 11 mos of medically supervised weight management This is an 8% TBW loss She did travel for vacation but stayed mindful of food choices She has seen a rise in appetite She continues to limit carbs and sugar She is boxing at the gym 2-3 x a week (with her son) and she added in more weight training at [employer] She has not been tracking calories.  She does aim for 90 g of protein daily  Pharmacotherapy: Mounjaro  10 mg weekly  PHYSICAL EXAM:  Blood pressure (!) 137/90, pulse 71, temperature 98.2 F (36.8 C), height 5' 3 (1.6 m), weight 195 lb (88.5 kg), SpO2 98%. Body mass index is 34.54 kg/m.  General: She is overweight, cooperative, alert, well developed, and in no acute distress. PSYCH: Has normal mood, affect and thought process.   Lungs: Normal breathing effort, no conversational dyspnea.   ASSESSMENT AND PLAN  TREATMENT PLAN FOR OBESITY:  Recommended Dietary Goals  [name] is currently in the action stage of change. As such, her goal is to continue weight management plan. She has agreed to keeping a food journal  and adhering to recommended goals of 1200-1500 calories and 85-110 g of  protein and following a lower carbohydrate, vegetable and lean protein rich diet plan.  Behavioral Intervention  We discussed the following Behavioral Modification Strategies today: increasing lean protein intake to established goals, increasing fiber rich foods, increasing water  intake , work on meal planning and preparation, work on tracking and journaling calories using tracking application, reading food labels , keeping healthy foods at home, decreasing eating out or consumption of processed foods, and making healthy choices when eating convenient foods, and continue to practice mindfulness when eating.  Additional resources provided today: NA  Recommended Physical Activity Goals  [name] has been advised to work up to 150 minutes of moderate intensity aerobic activity a week and strengthening exercises 2-3 times per week for cardiovascular health, weight loss maintenance and preservation of muscle mass.   She has agreed to Increase the intensity, frequency or duration of strengthening exercises  and Increase the intensity, frequency or duration of aerobic exercises    Pharmacotherapy changes for the treatment of obesity: increase Mounjaro  to 12.5 mg weekly  ASSOCIATED CONDITIONS ADDRESSED TODAY  Type 2 diabetes mellitus without complication, without long-term current use of insulin  (HCC) Lab Results  Component Value Date   HGBA1C 5.5 08/30/2023  She has good control of T2DM and has tolerated Mounjaro  10 mg weekly well.  Appetite  has increased over the past month.  Will increase to 12.5 mg weekly and work on dietary tracking as reviewed on AVS.  Continue cardio + weight training exercise 5 days/ wk  -     Tirzepatide ; Inject 12.5 mg into the skin once a week.  Dispense: 2 mL; Refill: 1  Class 1 obesity due to excess calories with serious comorbidity and body mass index (BMI) of 34.0 to 34.9 in adult  Status post  laparoscopic sleeve gastrectomy Has improved restriction s/p VSG with use of Mounjaro  + a reduced kcal HPLC diet.  She is at her nadir weight now! She is consistent with her vitamins and labs were updated at [facility], reviewed today  Essential hypertension DBP remains elevated at 90 She has some associated back pain and reports good compliance taking amlodopine 5 mg daily  F/u with Bernita Ned PA for further treatment   She was informed of the importance of frequent follow up visits to maximize her success with intensive lifestyle modifications for her multiple health conditions.   ATTESTASTION STATEMENTS:  Reviewed by clinician on day of visit: allergies, medications, problem list, medical history, surgical history, family history, social history, and previous encounter notes pertinent to obesity diagnosis.   I have personally spent 30 minutes total time today in preparation, patient care, nutritional counseling and education,  and documentation for this visit, including the following: review of most recent clinical lab tests, prescribing medications/ refilling medications, reviewing medical assistant documentation, review and interpretation of bioimpedence results.     [name], D.O. DABFM, DABOM [facility] [address] [place], [place] [address] [phone]

## 2024-07-23 ENCOUNTER — Other Ambulatory Visit: Payer: Self-pay | Admitting: Neurology

## 2024-07-23 MED ORDER — OCREVUS 300 MG/10ML IV SOLN: 600.0000 mg | INTRAVENOUS | 0 refills | Status: AC

## 2024-08-17 ENCOUNTER — Other Ambulatory Visit: Payer: Self-pay | Admitting: Podiatry

## 2024-08-19 ENCOUNTER — Encounter: Payer: Self-pay | Admitting: Family Medicine

## 2024-08-19 ENCOUNTER — Ambulatory Visit (INDEPENDENT_AMBULATORY_CARE_PROVIDER_SITE_OTHER): Admitting: Family Medicine

## 2024-08-19 VITALS — BP 135/83 | HR 60 | Temp 98.0°F | Ht 63.0 in | Wt 191.0 lb

## 2024-08-19 DIAGNOSIS — E119 Type 2 diabetes mellitus without complications: Secondary | ICD-10-CM | POA: Diagnosis not present

## 2024-08-19 DIAGNOSIS — E66811 Obesity, class 1: Secondary | ICD-10-CM

## 2024-08-19 DIAGNOSIS — Z7985 Long-term (current) use of injectable non-insulin antidiabetic drugs: Secondary | ICD-10-CM

## 2024-08-19 DIAGNOSIS — Z6833 Body mass index (BMI) 33.0-33.9, adult: Secondary | ICD-10-CM

## 2024-08-19 DIAGNOSIS — Z9884 Bariatric surgery status: Secondary | ICD-10-CM

## 2024-08-19 DIAGNOSIS — M5116 Intervertebral disc disorders with radiculopathy, lumbar region: Secondary | ICD-10-CM

## 2024-08-19 MED ORDER — TIRZEPATIDE 12.5 MG/0.5ML ~~LOC~~ SOAJ: 12.5000 mg | SUBCUTANEOUS | 2 refills | Status: DC

## 2024-08-19 MED ORDER — TIRZEPATIDE 12.5 MG/0.5ML ~~LOC~~ SOAJ
12.5000 mg | SUBCUTANEOUS | 1 refills | Status: DC
Start: 2024-08-19 — End: 2024-08-19

## 2024-08-19 NOTE — Progress Notes (Signed)
 Office: [phone]  /  Fax: [phone]  WEIGHT SUMMARY AND BIOMETRICS  Starting Date: 08/20/23  Starting Weight: 212lb   Weight Lost Since Last Visit: 4lb   Vitals Temp: 98 F (36.7 C) BP: 135/83 Pulse Rate: 60 SpO2: 100 %   Body Composition  Body Fat %: 44.2 % Fat Mass (lbs): 84.6 lbs Muscle Mass (lbs): 101.4 lbs Total Body Water  (lbs): 76.2 lbs Visceral Fat Rating : 12    HPI  Chief Complaint: OBESITY  [name] is here to discuss her progress with her obesity treatment plan. She is on the the Category 2 Plan and states she is following her eating plan approximately 85 % of the time. She states she is exercising 120 minutes 4 times per week.  Interval History:  Since last office visit she is down 4 lb She is down 0.2 pounds of muscle mass and down 3.6 pounds of body fat since last visit This gives her a net weight loss over 21 pounds in the past 12 months medically supervised weight management This is a 9.9% total body weight loss She is walking more and did the [facility] this weekend She has not had any MS exacerbations She did tolerate the dose increase of Mounjaro  to 12.5 mg weekly Doing well with food choices with meals She is getting ~90 g of protein daily She is getting in more fruits and veggies She is hydrating well with water  She is not craving sweets but is back to eating tic tacs as she is stopping vaping  Pharmacotherapy: 12.5 mg once weekly injection  PHYSICAL EXAM:  Blood pressure 135/83, pulse 60, temperature 98 F (36.7 C), height 5' 3 (1.6 m), weight 191 lb (86.6 kg), SpO2 100%. Body mass index is 33.83 kg/m.  General: She is overweight, cooperative, alert, well developed, and in no acute distress. PSYCH: Has normal mood, affect and thought process.   Lungs: Normal breathing effort, no conversational dyspnea.   ASSESSMENT AND PLAN  TREATMENT PLAN FOR OBESITY:  Recommended Dietary Goals  [name] is currently in the  action stage of change. As such, her goal is to continue weight management plan. She has agreed to keeping a food journal and adhering to recommended goals of 1200 calories and 80+ grams of protein and following a lower carbohydrate, vegetable and lean protein rich diet plan.  Behavioral Intervention  We discussed the following Behavioral Modification Strategies today: increasing lean protein intake to established goals, increasing vegetables, increasing fiber rich foods, increasing water  intake , work on meal planning and preparation, work on tracking and journaling calories using tracking application, keeping healthy foods at home, decreasing eating out or consumption of processed foods, and making healthy choices when eating convenient foods, avoiding temptations and identifying enticing environmental cues, continue to practice mindfulness when eating, and planning for success.  Additional resources provided today: NA  Recommended Physical Activity Goals  [name] has been advised to work up to 150 minutes of moderate intensity aerobic activity a week and strengthening exercises 2-3 times per week for cardiovascular health, weight loss maintenance and preservation of muscle mass.   She has agreed to Increase the intensity, frequency or duration of strengthening exercises  and Increase the intensity, frequency or duration of aerobic exercises    Pharmacotherapy changes for the treatment of obesity: None  ASSOCIATED CONDITIONS ADDRESSED TODAY  Type 2 diabetes mellitus without complication, without long-term current use of insulin  [name]) Lab Results  Component Value Date   HGBA1C 5.5 08/30/2023  She has good control of type 2 diabetes using Mounjaro  12.5 mg once weekly injection without meal skipping for GI side effects.  She is working on reducing her intake of carbohydrates especially refined carbohydrates and added sugar.  Continue active plan for weight reduction and plan to increase walking  time over the next 2 to 3 months.  She is scheduled for repeat labs with her PCP 12/30 -     Tirzepatide ; Inject 12.5 mg into the skin once a week.  Dispense: 2 mL; Refill: 2  Obesity, Class I, BMI 30-34.9 Improving.  Reviewed results from bioimpedance  BMI 33.0-33.9,adult  Status post laparoscopic sleeve gastrectomy She is happy with her results status post vertical sleeve gastrectomy in January 2023.  She is currently at her nadir weight.  She feels good at this weight and would like to lose about 15 more pounds.  She has room for improvement reducing intake of [name] as she is trying to quit vaping.  Suggest 2-3 sugar-free mints per day instead of [name].  Continue bariatric multivitamin daily.  She will be due for annual bariatric labs if not updated with her PCP at her December visit.  Continue to take a multivitamin once daily and calcium  and vitamin D  as directed.  Continue to work on lean protein intake with meals along with regular exercise.  Lumbar disc disease with radiculopathy Worsening.  She has some low back pain today, triggered by doing a trial at [name] this weekend.  Commended her on walking 3 miles without stopping.  She does have a prescription for [name]  to use as needed.  Suggest gentle stretching and a heating pad as needed.     She was informed of the importance of frequent follow up visits to maximize her success with intensive lifestyle modifications for her multiple health conditions.   ATTESTASTION STATEMENTS:  Reviewed by clinician on day of visit: allergies, medications, problem list, medical history, surgical history, family history, social history, and previous encounter notes pertinent to obesity diagnosis.   I have personally spent 30 minutes total time today in preparation, patient care, nutritional counseling and education,  and documentation for this visit, including the following: review of most recent clinical lab tests, prescribing  medications/ refilling medications, reviewing medical assistant documentation, review and interpretation of bioimpedence results.     Darice Haddock, D.O. DABFM, DABOM [facility] [address] [place], [place] [address] [phone]

## 2024-08-19 NOTE — Patient Instructions (Signed)
 Stay on a bariatric MVI daily (You should be getting D 3,000 international units )  Continue Mounjaro  12.5 mg weekly  Great job with walking! Aim for 30+ min of walking 5 days/ wk and  15 min of weight training from home 3 days/wk

## 2024-08-27 ENCOUNTER — Other Ambulatory Visit: Payer: Self-pay | Admitting: Physician Assistant

## 2024-08-27 DIAGNOSIS — Z1231 Encounter for screening mammogram for malignant neoplasm of breast: Secondary | ICD-10-CM

## 2024-08-31 ENCOUNTER — Other Ambulatory Visit: Payer: Self-pay | Admitting: Family Medicine

## 2024-08-31 DIAGNOSIS — E119 Type 2 diabetes mellitus without complications: Secondary | ICD-10-CM

## 2024-09-03 ENCOUNTER — Encounter: Payer: Self-pay | Admitting: Obstetrics and Gynecology

## 2024-09-03 ENCOUNTER — Ambulatory Visit (INDEPENDENT_AMBULATORY_CARE_PROVIDER_SITE_OTHER): Admitting: Obstetrics and Gynecology

## 2024-09-03 VITALS — BP 138/90 | HR 62 | Ht 62.6 in | Wt 196.0 lb

## 2024-09-03 DIAGNOSIS — R87610 Atypical squamous cells of undetermined significance on cytologic smear of cervix (ASC-US): Secondary | ICD-10-CM | POA: Diagnosis not present

## 2024-09-03 DIAGNOSIS — N951 Menopausal and female climacteric states: Secondary | ICD-10-CM

## 2024-09-03 DIAGNOSIS — B009 Herpesviral infection, unspecified: Secondary | ICD-10-CM | POA: Diagnosis not present

## 2024-09-03 DIAGNOSIS — Z1211 Encounter for screening for malignant neoplasm of colon: Secondary | ICD-10-CM

## 2024-09-03 DIAGNOSIS — Z01419 Encounter for gynecological examination (general) (routine) without abnormal findings: Secondary | ICD-10-CM

## 2024-09-03 DIAGNOSIS — N898 Other specified noninflammatory disorders of vagina: Secondary | ICD-10-CM

## 2024-09-03 DIAGNOSIS — Z9189 Other specified personal risk factors, not elsewhere classified: Secondary | ICD-10-CM

## 2024-09-03 DIAGNOSIS — N938 Other specified abnormal uterine and vaginal bleeding: Secondary | ICD-10-CM

## 2024-09-03 LAB — ESTRADIOL: Estradiol: 51 pg/mL

## 2024-09-03 LAB — FOLLICLE STIMULATING HORMONE: FSH: 50.7 m[IU]/mL

## 2024-09-03 NOTE — Progress Notes (Signed)
 53 y.o. y.o. female here for annual medicare gyn exam. Patient's last menstrual period was 08/07/2024 (within days). She has period for the full month of September and nothing after.  Did state it was after intercourse Occasional hot flashes that are not bothersome No HRT  Has recurrent BV and uses boric acid occasionally Would like testing today Works at [employer] and is on leave for vetebral surgery for MS Also with h/o gastric bypass and complete thyroidectomy was at 312lb before surgery   Pelvic discharge: + reports history of recurrent BV. Does report her current partner was unfaithful at one time Last mammogram: 10/01/24 Last colonoscopy: not done and states she will do wthis year Pap smear: reports last one in 2019. Denies any abnormal pap smears Dxa baseline 02/01/24 normal repeat in 2 years. Unstable gate with MS and risks factors for osteoporosis  Body mass index is 35.17 kg/m.     08/18/2021   10:38 AM 10/20/2018    8:23 AM 09/17/2018    9:58 AM  Depression screen PHQ 2/9  Decreased Interest 0    Down, Depressed, Hopeless 0    PHQ - 2 Score 0       Information is confidential and restricted. Go to Review Flowsheets to unlock data.    Blood pressure (!) 138/90, pulse 62, height 5' 2.6 (1.59 m), weight 196 lb (88.9 kg), last menstrual period 08/07/2024, SpO2 94%.     Component Value Date/Time   DIAGPAP (A) 08/30/2023 1124    - Atypical squamous cells of undetermined significance (ASC-US )   HPVHIGH Negative 08/30/2023 1124   ADEQPAP  08/30/2023 1124    Satisfactory for evaluation; transformation zone component ABSENT.    GYN HISTORY:    Component Value Date/Time   DIAGPAP (A) 08/30/2023 1124    - Atypical squamous cells of undetermined significance (ASC-US )   HPVHIGH Negative 08/30/2023 1124   ADEQPAP  08/30/2023 1124    Satisfactory for evaluation; transformation zone component ABSENT.    OB History  Gravida Para Term Preterm AB Living  3     3  SAB  IAB Ectopic Multiple Live Births          # Outcome Date GA Lbr Len/2nd Weight Sex Type Anes PTL Lv  3 Gravida           2 Gravida           1 [place]             Past Medical History:  Diagnosis Date   Allergy    Anemia 2008   Anxiety    Arthritis    Back pain    Celiac disease    Chest pain    Constipation    Depression    GERD (gastroesophageal reflux disease)    High cholesterol    Hypertension    Hypothyroidism    Iron overload, transfusional    Joint pain    Lactose intolerance    Migraine    MS (multiple sclerosis)    OSA on CPAP    C-PAP   Pre-diabetes    Sickle cell trait    Swelling of lower extremity    Tachycardia    Vitamin D  deficiency     Past Surgical History:  Procedure Laterality Date   CERVICAL ABLATION     CESAREAN SECTION     3 times   CHOLECYSTECTOMY  1990   HIATAL HERNIA REPAIR N/A 12/13/2021   Procedure: HERNIA REPAIR HIATAL;  Surgeon: Gladis Cough, MD;  Location: [facility];  Service: General;  Laterality: N/A;   LAPAROSCOPIC GASTRIC SLEEVE RESECTION WITH HIATAL HERNIA REPAIR  12/13/2021   LASER ABLATION OF THE CERVIX     PARATHYROIDECTOMY N/A 07/24/2023   Procedure: NECK EXPLORATION WITH PARATHYROIDECTOMY;  Surgeon: Eletha Boas, MD;  Location: [facility];  Service: General;  Laterality: N/A;   THYROID  SURGERY     TUBAL LIGATION  1995   UPPER GI ENDOSCOPY N/A 12/13/2021   Procedure: UPPER GI ENDOSCOPY;  Surgeon: Gladis Cough, MD;  Location: [facility];  Service: General;  Laterality: N/A;    Current Outpatient Medications on File Prior to Visit  Medication Sig Dispense Refill   amLODipine  (NORVASC ) 5 MG tablet Take 1 tablet (5 mg total) by mouth daily. 90 tablet 3   BIOTIN PO Place 1 mL under the tongue daily. With Collagen (15000 mg per drop)     calcium  carbonate (TUMS EX) 750 MG chewable tablet Chew 1 tablet by mouth daily.     cholecalciferol  (VITAMIN D3) 25 MCG (1000 UNIT) tablet Take 1,000 Units by mouth daily.     ocrelizumab   (OCREVUS ) 300 MG/10ML injection Inject 20 mLs (600 mg total) into the vein See admin instructions. Every 6 months 20 mL 0   pantoprazole  (PROTONIX ) 40 MG tablet Take 40 mg by mouth daily.     tirzepatide  (MOUNJARO ) 12.5 MG/0.5ML Pen Inject 12.5 mg into the skin once a week. 2 mL 2   tiZANidine  ([name] ) 4 MG tablet Take 1 tablet (4 mg total) by mouth every 6 (six) hours as needed for up to 10 days 30 tablet 2   valACYclovir  (VALTREX ) 1000 MG tablet Take one tablet by mouth 2 times daily. Start with first symptom of outbreak. 14 tablet 0   IRON, FERROUS GLUCONATE , PO Take by mouth. (Patient not taking: Reported on 09/03/2024)     terbinafine  (LAMISIL ) 250 MG tablet Take 1 tablet (250 mg total) by mouth daily. (Patient not taking: Reported on 09/03/2024) 90 tablet 0   No current facility-administered medications on file prior to visit.    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, answers calls    Employer: [employer]    Comment: [employer]  Tobacco Use   Smoking status: Former    Current packs/day: 0.00    Average packs/day: 0.5 packs/day for 25.0 years (12.5 ttl pk-yrs)    Types: Cigarettes    Start date: 09/16/1996    Quit date: 09/16/2021    Years since quitting: 2.9   Smokeless tobacco: Never   Tobacco comments:    12/25/18  quit 2 months ago        Vapes daily   Vaping Use   Vaping status: Some Days  Substance and Sexual Activity   Alcohol use: Yes    Comment: occ   Drug use: No   Sexual activity: Yes    Partners: Male    Birth control/protection: None  Other Topics Concern   Not on file  Social History Narrative   Patient lives with children    Pt not working    Social Drivers of Corporate investment banker Strain: Low Risk  (05/07/2024)   Received from  [facility]   Overall Financial Resource Strain (  CARDIA)    Difficulty of Paying Living Expenses: Not hard at all  Food Insecurity: No Food Insecurity (05/07/2024)   Received from [facility]   Hunger Vital Sign    Within the past 12 months, you worried that your food would run out before you got the money to buy more.: Never true    Within the past 12 months, the food you bought just didn't last and you didn't have money to get more.: Never true  Transportation Needs: No Transportation Needs (05/07/2024)   Received from [facility] - Transportation    Lack of Transportation (Medical): No    Lack of Transportation (Non-Medical): No  Physical Activity: Sufficiently Active (05/07/2024)   Received from [facility]   Exercise Vital Sign    On average, how many days per week do you engage in moderate to strenuous exercise (like a brisk walk)?: 4 days    On average, how many minutes do you engage in exercise at this level?: 40 min  Stress: No Stress Concern Present (05/07/2024)   Received from [facility] - Occupational Stress Questionnaire    Feeling of Stress : Not at all  Social Connections: Socially Integrated (05/07/2024)   Received from [facility]   Social Network    How would you rate your social network (family, work, friends)?: Good participation with social networks  Intimate Partner Violence: Not At Risk (05/07/2024)   Received from [facility]   HITS    Over the last 12 months how often did your partner physically hurt you?: Never    Over the last 12 months how often did your partner insult you or talk down to you?: Never    Over the last 12 months how often did your partner threaten you with physical harm?: Never    Over the last 12 months how often did your partner scream or curse at you?: Never    Family History  Problem Relation Age of Onset   Mental illness Mother    Hyperlipidemia Mother     Hyperthyroidism Mother    Mental retardation Mother    Other Mother        Covid   Sudden death Mother    Thyroid  disease Mother    Depression Mother    Anxiety disorder Mother    Bipolar disorder Mother    Schizophrenia Mother    Diabetes Father    Cancer Father    Sudden death Father    Kidney disease Father    Liver disease Father    Cancer Paternal Aunt        breast   Cancer Paternal Aunt        breast   Arthritis Maternal Grandmother    Diabetes Maternal Grandmother    Hearing loss Maternal Grandmother        left hear when she was a child   Hyperlipidemia Maternal Grandmother    Hypertension Maternal Grandmother    Alcohol abuse Maternal Grandfather    Early death Maternal Grandfather    Multiple sclerosis Maternal Grandfather    ADD / ADHD Son    Multiple sclerosis Cousin    Breast cancer Neg Hx    Colon cancer Neg Hx    Esophageal cancer Neg Hx    Inflammatory bowel disease Neg Hx    Pancreatic cancer Neg Hx    Stomach cancer Neg Hx    Rectal cancer Neg Hx  Sleep apnea Neg Hx      Allergies  Allergen Reactions   Iron Shortness Of Breath    IV only    Tysabri  [Natalizumab ] Other (See Comments)    Shortness of breath, joint pain, tremors   Dextromethorphan Other (See Comments)    nightmares   Nsaids     Other Reaction(s): Other  S/p bariatric surgery      Patient's last menstrual period was Patient's last menstrual period was 08/07/2024 (within days)..            Review of Systems Alls systems reviewed and are negative.     Physical Exam Constitutional:      Appearance: Normal appearance.  Genitourinary:     Vulva and urethral meatus normal.     No lesions in the vagina.     Right Labia: No rash, lesions or skin changes.    Left Labia: No lesions, skin changes or rash.    No vaginal discharge or tenderness.     No vaginal prolapse present.    No vaginal atrophy present.     Right Adnexa: not tender, not palpable and no mass  present.    Left Adnexa: not tender, not palpable and no mass present.    No cervical motion tenderness or discharge.     Uterus is not enlarged, tender or irregular.  Breasts:    Right: Normal.     Left: Normal.  HENT:     Head: Normocephalic.  Neck:     Thyroid : No thyroid  mass, thyromegaly or thyroid  tenderness.  Cardiovascular:     Rate and Rhythm: Normal rate and regular rhythm.     Heart sounds: Normal heart sounds, S1 normal and S2 normal.  Pulmonary:     Effort: Pulmonary effort is normal.     Breath sounds: Normal breath sounds and air entry.  Abdominal:     General: There is no distension.     Palpations: Abdomen is soft. There is no mass.     Tenderness: There is no abdominal tenderness. There is no guarding or rebound.  Musculoskeletal:        General: Normal range of motion.     Cervical back: Full passive range of motion without pain, normal range of motion and neck supple. No tenderness.     Right lower leg: No edema.     Left lower leg: No edema.  Neurological:     Mental Status: She is alert.  Skin:    General: Skin is warm.  Psychiatric:        Mood and Affect: Mood normal.        Behavior: Behavior normal.        Thought Content: Thought content normal.  Vitals and nursing note reviewed. Exam conducted with a chaperone present.       A:         Well Woman GYN exam PMB                             P:        Pap smear not indicated Encouraged annual mammogram screening Colon cancer screening up-to-date DXA up-to-date Labs and immunizations to do with PMD Discussed breast self exams Encouraged healthy lifestyle practices Encouraged Vit D and Calcium    No follow-ups on file.  [name]

## 2024-09-04 ENCOUNTER — Ambulatory Visit: Payer: Self-pay | Admitting: Obstetrics and Gynecology

## 2024-09-04 DIAGNOSIS — N938 Other specified abnormal uterine and vaginal bleeding: Secondary | ICD-10-CM

## 2024-09-04 LAB — SURESWAB® ADVANCED VAGINITIS PLUS,TMA
C. trachomatis RNA, TMA: NOT DETECTED
CANDIDA SPECIES: NOT DETECTED
Candida glabrata: NOT DETECTED
N. gonorrhoeae RNA, TMA: NOT DETECTED
SURESWAB(R) ADV BACTERIAL VAGINOSIS(BV),TMA: NEGATIVE
TRICHOMONAS VAGINALIS (TV),TMA: NOT DETECTED

## 2024-09-04 NOTE — Telephone Encounter (Signed)
**Note De-identified  [place] Obfuscation** Please advise 

## 2024-09-04 NOTE — Addendum Note (Signed)
 Addended by: GLENNON ALMARIE POUR on: 09/04/2024 11:49 AM   Modules accepted: Orders

## 2024-09-04 NOTE — Telephone Encounter (Signed)
-----   Message from [place] S sent at 09/04/2024  8:23 AM EDT ----- Regarding: US  PUS only Possible EMB based on the ultrasound results/ per check out comment  There is no order and no mention of it on yesterdays notes. Does patient need US  scheduled? If yes please enter order. Thx

## 2024-09-07 LAB — SURESWAB MYCOPLASMA/UREAPLASMA, PCR
M. hominis DNA: NOT DETECTED
MYCOPLASMA GENITALIUM, rRNA,TMA: NOT DETECTED
U. parvum DNA: NOT DETECTED
U. urealyticum DNA: NOT DETECTED

## 2024-09-09 ENCOUNTER — Encounter: Payer: Self-pay | Admitting: Obstetrics and Gynecology

## 2024-09-16 NOTE — Telephone Encounter (Signed)
 See result encounter dated 09/04/24.   Encounter closed.

## 2024-09-16 NOTE — Addendum Note (Signed)
 Addended by: BRUTUS KATE SAILOR on: 09/16/2024 10:55 AM   Modules accepted: Orders

## 2024-09-18 ENCOUNTER — Other Ambulatory Visit

## 2024-09-18 ENCOUNTER — Encounter: Payer: Self-pay | Admitting: Obstetrics and Gynecology

## 2024-09-18 ENCOUNTER — Other Ambulatory Visit: Payer: Self-pay | Admitting: Obstetrics and Gynecology

## 2024-09-18 ENCOUNTER — Ambulatory Visit: Payer: Self-pay | Admitting: Obstetrics and Gynecology

## 2024-09-18 ENCOUNTER — Ambulatory Visit (INDEPENDENT_AMBULATORY_CARE_PROVIDER_SITE_OTHER): Admitting: Obstetrics and Gynecology

## 2024-09-18 VITALS — BP 138/98 | HR 80 | Wt 198.2 lb

## 2024-09-18 DIAGNOSIS — N938 Other specified abnormal uterine and vaginal bleeding: Secondary | ICD-10-CM | POA: Diagnosis not present

## 2024-09-18 DIAGNOSIS — Z01419 Encounter for gynecological examination (general) (routine) without abnormal findings: Secondary | ICD-10-CM

## 2024-09-18 DIAGNOSIS — N859 Noninflammatory disorder of uterus, unspecified: Secondary | ICD-10-CM

## 2024-09-18 DIAGNOSIS — N951 Menopausal and female climacteric states: Secondary | ICD-10-CM

## 2024-09-18 DIAGNOSIS — N898 Other specified noninflammatory disorders of vagina: Secondary | ICD-10-CM

## 2024-09-18 DIAGNOSIS — D219 Benign neoplasm of connective and other soft tissue, unspecified: Secondary | ICD-10-CM | POA: Diagnosis not present

## 2024-09-18 DIAGNOSIS — Z1211 Encounter for screening for malignant neoplasm of colon: Secondary | ICD-10-CM

## 2024-09-18 MED ORDER — PROGESTERONE MICRONIZED 100 MG PO CAPS: 100.0000 mg | ORAL_CAPSULE | Freq: Every day | ORAL | 3 refills | Status: AC

## 2024-09-18 NOTE — Patient Instructions (Signed)
 Perimenopause/Menopause suggestions   You should be getting 1,200 mg of calcium  a day between your diet and supplements and at least 3000 IU a day of Vit D. You should exercise regularly with weight bearing exercises. I would recommend a bone density q 2 years.  We loose 5% per year in this time period of our bone density, due to the loss of estrogen. Magnesium at bedtime for our sleep and bones (follow recommended dose on bottle)  Please read The New Menopause my Dr. [name]  Try to avoid caffeine and alcohol in this period, as they can make symptoms worse  Continue annual mammograms, unless told sooner  Please reach out with any questions [name]

## 2024-09-18 NOTE — Progress Notes (Signed)
   Acute Office Visit  Subjective:    Patient ID: [name], female    DOB: 26-Jun-1971, 53 y.o.   MRN: [id number]   HPI 52 y.o. presents today for Consult (Consult on Pelvic US  and EMB  ) . Patient with occasional spotting off and on and last spotting 2 months ago. She is under a lot of stress with her son. She had to do CPR this weekend for drug overdose. She reports she has been smoking due to the stress She does not want to do an EMB and understands the risks She is okay starting oral progesterone at night  She does report insomnia and few hot flushes/warm feelings No dyspareunia or vaginal dryness  Patient's last menstrual period was 08/07/2024 (within days).    Review of Systems     Objective:    OBGyn Exam  BP (!) 138/98   Pulse 80   Wt 198 lb 3.2 oz (89.9 kg)   LMP 08/07/2024 (Within Days)   SpO2 99%   BMI 35.56 kg/m  Wt Readings from Last 3 Encounters:  09/18/24 198 lb 3.2 oz (89.9 kg)  09/03/24 196 lb (88.9 kg)  08/19/24 191 lb (86.6 kg)     PUS today 10.45cm EMB 5.91mm Fibroids 2.33, 5.27,2.64cm No adnexal masses No free fluid  Assessment & Plan:  PMB Fibroids Borderline thickened endometrial lining at 5.30mm  PUS results reviewed with patient. States she does not feel like she is in menopause. Counseled on the importance for the EMB to rule out precancerous or cancerous changes.  She does not want to have it done and has done is in the past.  She will start progesterone and repeat the PUS in 6 months and was okay does the EMB if still above the 5mm point at that time. Cannot start the estrogen with her bp and smoking and she is working on that, but can wait on the estrogen patch.  30 minutes spent on reviewing records, imaging,  and one on one patient time and counseling patient and documentation Dr. Glennon Almarie [name]

## 2024-09-19 ENCOUNTER — Encounter (HOSPITAL_COMMUNITY): Payer: Self-pay

## 2024-09-19 ENCOUNTER — Other Ambulatory Visit (HOSPITAL_COMMUNITY): Payer: Self-pay

## 2024-09-19 ENCOUNTER — Other Ambulatory Visit: Payer: Self-pay

## 2024-09-19 MED ORDER — VARENICLINE TARTRATE 1 MG PO TABS
ORAL_TABLET | ORAL | 5 refills | Status: AC
  Filled 2024-09-19: qty 60, 40d supply, fill #0

## 2024-10-01 ENCOUNTER — Ambulatory Visit
Admission: RE | Admit: 2024-10-01 | Discharge: 2024-10-01 | Disposition: A | Discharge status: Home / Self Care | Source: Ambulatory Visit | Attending: Physician Assistant | Admitting: Physician Assistant

## 2024-10-01 DIAGNOSIS — Z1231 Encounter for screening mammogram for malignant neoplasm of breast: Secondary | ICD-10-CM

## 2024-10-02 ENCOUNTER — Ambulatory Visit: Admitting: Obstetrics and Gynecology

## 2024-10-21 ENCOUNTER — Ambulatory Visit: Admitting: Family Medicine

## 2024-10-21 ENCOUNTER — Encounter: Payer: Self-pay | Admitting: Family Medicine

## 2024-10-21 VITALS — BP 134/86 | HR 78 | Temp 98.3°F | Ht 63.0 in | Wt 190.0 lb

## 2024-10-21 DIAGNOSIS — F17210 Nicotine dependence, cigarettes, uncomplicated: Secondary | ICD-10-CM

## 2024-10-21 DIAGNOSIS — E119 Type 2 diabetes mellitus without complications: Secondary | ICD-10-CM | POA: Diagnosis not present

## 2024-10-21 DIAGNOSIS — E66811 Obesity, class 1: Secondary | ICD-10-CM

## 2024-10-21 DIAGNOSIS — Z7985 Long-term (current) use of injectable non-insulin antidiabetic drugs: Secondary | ICD-10-CM

## 2024-10-21 DIAGNOSIS — Z6833 Body mass index (BMI) 33.0-33.9, adult: Secondary | ICD-10-CM

## 2024-10-21 DIAGNOSIS — Z9884 Bariatric surgery status: Secondary | ICD-10-CM

## 2024-10-21 DIAGNOSIS — F17200 Nicotine dependence, unspecified, uncomplicated: Secondary | ICD-10-CM

## 2024-10-21 MED ORDER — TIRZEPATIDE 15 MG/0.5ML ~~LOC~~ SOAJ: 15.0000 mg | SUBCUTANEOUS | 1 refills | Status: AC

## 2024-10-21 NOTE — Progress Notes (Signed)
 Office: [phone]  /  Fax: [phone]  WEIGHT SUMMARY AND BIOMETRICS  Starting Date: 08/20/23  Starting Weight: 212lb   Weight Lost Since Last Visit: 1lb   Vitals Temp: 98.3 F (36.8 C) BP: 134/86 Pulse Rate: 78 SpO2: 96 %   Body Composition  Body Fat %: 42.7 % Fat Mass (lbs): 81.2 lbs Muscle Mass (lbs): 103.2 lbs Total Body Water  (lbs): 73 lbs Visceral Fat Rating : 11     HPI  Chief Complaint: OBESITY  [name] is here to discuss her progress with her obesity treatment plan. She is on the the Category 2 Plan and states she is following her eating plan approximately 95 % of the time. She states she is exercising 30-60 minutes 2-3 times per week.   Interval History:  Since last office visit she is down 1 lb This gives her a net weight loss of 22 lb in 14 mos of medically supervised weight management This gives her a 10% TBW loss and she is lower than her previous nadir weight s/p  VSG in 2023 of 208 lb. She hasn't gone to the boxing gym much since last visit She is mindful of food choices and portion sizes She has been eating out more She has a good amount of satiety from Mounjaro  12.5 mg weekly She has cut back on her candy intake She still has some back pain on and off She is still working on smoking cessation  Pharmacotherapy: Mounjaro  12.5 mg weekly  PHYSICAL EXAM:  Blood pressure 134/86, pulse 78, temperature 98.3 F (36.8 C), height 5' 3 (1.6 m), weight 190 lb (86.2 kg), last menstrual period 08/07/2024, SpO2 96%. Body mass index is 33.66 kg/m.  General: She is overweight, cooperative, alert, well developed, and in no acute distress. PSYCH: Has normal mood, affect and thought process.   Lungs: Normal breathing effort, no conversational dyspnea.  ASSESSMENT AND PLAN  TREATMENT PLAN FOR OBESITY:  Recommended Dietary Goals  [name] is currently in the action stage of change. As such, her goal is to continue weight management plan. She has  agreed to following a lower carbohydrate, vegetable and lean protein rich diet plan.  Behavioral Intervention  We discussed the following Behavioral Modification Strategies today: increasing lean protein intake to established goals, increasing water  intake , work on meal planning and preparation, keeping healthy foods at home, continue to practice mindfulness when eating, and display self-compassion.  Additional resources provided today: NA  Recommended Physical Activity Goals  [name] has been advised to work up to 150 minutes of moderate intensity aerobic activity a week and strengthening exercises 2-3 times per week for cardiovascular health, weight loss maintenance and preservation of muscle mass.   She has agreed to [employer] strengthening exercises with a goal of 2-3 sessions a week  and Start aerobic activity with a goal of 150 minutes a week at moderate intensity.  Resume boxing workouts 2-3 x a week  Pharmacotherapy changes for the treatment of obesity: increase Mounjaro  to 15 mg weekly  ASSOCIATED CONDITIONS ADDRESSED TODAY  Type 2 diabetes mellitus without complication, without long-term current use of insulin  (HCC) Lab Results  Component Value Date   HGBA1C 5.5 08/30/2023  Good control of diabetes on Mounjaro  12.5 mg weekly but has struggled more with eating now that she is quitting smoking.  Increase to 15 mg weekly.  Continue to limit intake of high sugar foods and drinks while getting in lean protein and fiber at mealtime.  Has room to ramp up  regular exercise.  Denies feelings of hypoglycemia. -     Tirzepatide ; Inject 15 mg into the skin once a week.  Dispense: 2 mL; Refill: 1    Obesity, Class I, BMI 30-34.9 Improving Overall doing well and getting closer to her target weight ~175 lb  BMI 33.0-33.9,adult  Status post laparoscopic sleeve gastrectomy Overall doing well with small portions, lean protein with meals, eating on a schedule and intake of a bariatric MVI daily.   She has room for improvement to not skip lunches due to her schedule, add back in cardio and resistance training and annual bariatric labs are due.  She is scheduled to see her PCP later this month.  Smoker Actively working on smoking cessation, recently started Chantix  w/ her PCP.  She did switch from vaping (a lot) to smoking about 3 cigarettes per day.  She understands that the usual weight gain associated with smoking cessation is likely to be offset by her active weight loss plan.   She was informed of the importance of frequent follow up visits to maximize her success with intensive lifestyle modifications for her multiple health conditions.   ATTESTASTION STATEMENTS:  Reviewed by clinician on day of visit: allergies, medications, problem list, medical history, surgical history, family history, social history, and previous encounter notes pertinent to obesity diagnosis.   I have personally spent 30 minutes total time today in preparation, patient care, nutritional counseling and education,  and documentation for this visit, including the following: review of most recent clinical lab tests, prescribing medications/ refilling medications, reviewing medical assistant documentation, review and interpretation of bioimpedence results.     Darice Haddock, D.O. DABFM, DABOM [facility] [address] [place], [place] [address] [phone]

## 2024-11-15 ENCOUNTER — Other Ambulatory Visit (HOSPITAL_COMMUNITY): Payer: Self-pay

## 2024-11-15 MED ORDER — ROSUVASTATIN CALCIUM 10 MG PO TABS
10.0000 mg | ORAL_TABLET | Freq: Every day | ORAL | 3 refills | Status: AC
  Filled 2024-11-15: qty 90, 90d supply, fill #0

## 2024-11-16 ENCOUNTER — Other Ambulatory Visit (HOSPITAL_COMMUNITY): Payer: Self-pay

## 2024-12-10 ENCOUNTER — Ambulatory Visit: Admitting: Family Medicine

## 2024-12-18 ENCOUNTER — Ambulatory Visit: Admitting: Family Medicine

## 2024-12-25 ENCOUNTER — Ambulatory Visit: Admitting: Family Medicine

## 2025-01-07 ENCOUNTER — Ambulatory Visit: Admitting: Family Medicine

## 2025-04-03 ENCOUNTER — Ambulatory Visit: Admitting: Family Medicine

## 2025-04-10 ENCOUNTER — Ambulatory Visit: Admitting: Family Medicine
